# Patient Record
Sex: Male | Born: 1945 | Race: Black or African American | Hispanic: No | Marital: Single | Smoking: Former smoker
Health system: Southern US, Community
[De-identification: ages and names within clinical notes are randomized; demographics above are authoritative.]

## PROBLEM LIST (undated history)

## (undated) DIAGNOSIS — I10 Essential (primary) hypertension: Secondary | ICD-10-CM

## (undated) DIAGNOSIS — M1990 Unspecified osteoarthritis, unspecified site: Secondary | ICD-10-CM

## (undated) DIAGNOSIS — E785 Hyperlipidemia, unspecified: Secondary | ICD-10-CM

## (undated) DIAGNOSIS — I4891 Unspecified atrial fibrillation: Secondary | ICD-10-CM

## (undated) DIAGNOSIS — I219 Acute myocardial infarction, unspecified: Secondary | ICD-10-CM

## (undated) DIAGNOSIS — Q248 Other specified congenital malformations of heart: Secondary | ICD-10-CM

## (undated) DIAGNOSIS — Z95810 Presence of automatic (implantable) cardiac defibrillator: Secondary | ICD-10-CM

## (undated) DIAGNOSIS — K635 Polyp of colon: Secondary | ICD-10-CM

## (undated) DIAGNOSIS — J449 Chronic obstructive pulmonary disease, unspecified: Secondary | ICD-10-CM

## (undated) DIAGNOSIS — M2390 Unspecified internal derangement of unspecified knee: Secondary | ICD-10-CM

## (undated) DIAGNOSIS — E119 Type 2 diabetes mellitus without complications: Secondary | ICD-10-CM

## (undated) DIAGNOSIS — N400 Enlarged prostate without lower urinary tract symptoms: Secondary | ICD-10-CM

## (undated) DIAGNOSIS — R7303 Prediabetes: Secondary | ICD-10-CM

## (undated) DIAGNOSIS — K219 Gastro-esophageal reflux disease without esophagitis: Secondary | ICD-10-CM

## (undated) DIAGNOSIS — I639 Cerebral infarction, unspecified: Secondary | ICD-10-CM

## (undated) DIAGNOSIS — D472 Monoclonal gammopathy: Secondary | ICD-10-CM

## (undated) DIAGNOSIS — W19XXXA Unspecified fall, initial encounter: Secondary | ICD-10-CM

## (undated) DIAGNOSIS — Z87448 Personal history of other diseases of urinary system: Secondary | ICD-10-CM

## (undated) DIAGNOSIS — I2510 Atherosclerotic heart disease of native coronary artery without angina pectoris: Secondary | ICD-10-CM

## (undated) DIAGNOSIS — G40909 Epilepsy, unspecified, not intractable, without status epilepticus: Secondary | ICD-10-CM

## (undated) DIAGNOSIS — N181 Chronic kidney disease, stage 1: Secondary | ICD-10-CM

## (undated) HISTORY — DX: Unspecified atrial fibrillation: I48.91

## (undated) HISTORY — DX: Gastro-esophageal reflux disease without esophagitis: K21.9

## (undated) HISTORY — DX: Hyperlipidemia, unspecified: E78.5

## (undated) HISTORY — DX: Chronic obstructive pulmonary disease, unspecified: J44.9

## (undated) HISTORY — DX: Prediabetes: R73.03

## (undated) HISTORY — DX: Benign prostatic hyperplasia without lower urinary tract symptoms: N40.0

## (undated) HISTORY — DX: Cerebral infarction, unspecified: I63.9

## (undated) HISTORY — DX: Unspecified fall, initial encounter: W19.XXXA

## (undated) HISTORY — DX: Unspecified osteoarthritis, unspecified site: M19.90

## (undated) HISTORY — DX: Chronic kidney disease, stage 1: N18.1

## (undated) HISTORY — PX: INGUINAL HERNIA REPAIR: SUR1180

## (undated) HISTORY — DX: Polyp of colon: K63.5

## (undated) HISTORY — DX: Essential (primary) hypertension: I10

## (undated) HISTORY — DX: Personal history of other diseases of urinary system: Z87.448

## (undated) HISTORY — DX: Presence of automatic (implantable) cardiac defibrillator: Z95.810

## (undated) HISTORY — DX: Atherosclerotic heart disease of native coronary artery without angina pectoris: I25.10

## (undated) HISTORY — DX: Monoclonal gammopathy: D47.2

## (undated) HISTORY — DX: Epilepsy, unspecified, not intractable, without status epilepticus: G40.909

## (undated) HISTORY — DX: Other specified congenital malformations of heart: Q24.8

---
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
## 1983-06-22 DIAGNOSIS — I219 Acute myocardial infarction, unspecified: Secondary | ICD-10-CM

## 1983-06-22 HISTORY — DX: Acute myocardial infarction, unspecified: I21.9

## 1985-03-23 HISTORY — PX: CARDIAC CATHETERIZATION: SHX172

## 2002-03-23 DIAGNOSIS — K635 Polyp of colon: Secondary | ICD-10-CM

## 2002-03-23 HISTORY — DX: Polyp of colon: K63.5

## 2003-06-21 ENCOUNTER — Ambulatory Visit (HOSPITAL_COMMUNITY): Admission: RE | Admit: 2003-06-21 | Discharge: 2003-06-21 | Payer: Self-pay | Admitting: Psychiatry

## 2003-06-21 ENCOUNTER — Encounter: Payer: Self-pay | Admitting: Cardiology

## 2003-11-20 ENCOUNTER — Inpatient Hospital Stay (HOSPITAL_COMMUNITY): Admission: EM | Admit: 2003-11-20 | Discharge: 2003-11-22 | Payer: Self-pay | Admitting: Emergency Medicine

## 2003-11-21 ENCOUNTER — Encounter: Payer: Self-pay | Admitting: Cardiology

## 2004-03-03 ENCOUNTER — Ambulatory Visit: Payer: Self-pay | Admitting: Pulmonary Disease

## 2004-05-08 ENCOUNTER — Ambulatory Visit: Payer: Self-pay | Admitting: Pulmonary Disease

## 2004-06-03 ENCOUNTER — Ambulatory Visit: Payer: Self-pay | Admitting: Pulmonary Disease

## 2004-06-06 ENCOUNTER — Emergency Department (HOSPITAL_COMMUNITY): Admission: EM | Admit: 2004-06-06 | Discharge: 2004-06-06 | Payer: Self-pay | Admitting: Family Medicine

## 2004-12-04 ENCOUNTER — Ambulatory Visit: Payer: Self-pay | Admitting: Pulmonary Disease

## 2005-04-02 ENCOUNTER — Ambulatory Visit: Payer: Self-pay | Admitting: Pulmonary Disease

## 2005-06-15 ENCOUNTER — Inpatient Hospital Stay (HOSPITAL_COMMUNITY): Admission: EM | Admit: 2005-06-15 | Discharge: 2005-06-17 | Payer: Self-pay | Admitting: Emergency Medicine

## 2005-06-24 ENCOUNTER — Ambulatory Visit: Payer: Self-pay | Admitting: Pulmonary Disease

## 2005-07-07 ENCOUNTER — Encounter: Admission: RE | Admit: 2005-07-07 | Discharge: 2005-08-05 | Payer: Self-pay | Admitting: Neurology

## 2005-07-31 ENCOUNTER — Ambulatory Visit: Payer: Self-pay | Admitting: Pulmonary Disease

## 2005-08-18 ENCOUNTER — Encounter: Admission: RE | Admit: 2005-08-18 | Discharge: 2005-11-16 | Payer: Self-pay | Admitting: Pulmonary Disease

## 2005-09-11 ENCOUNTER — Ambulatory Visit: Payer: Self-pay | Admitting: Pulmonary Disease

## 2005-10-20 ENCOUNTER — Ambulatory Visit: Payer: Self-pay | Admitting: Pulmonary Disease

## 2005-11-17 ENCOUNTER — Ambulatory Visit: Payer: Self-pay | Admitting: Pulmonary Disease

## 2005-12-01 ENCOUNTER — Ambulatory Visit: Payer: Self-pay | Admitting: Pulmonary Disease

## 2005-12-14 ENCOUNTER — Ambulatory Visit: Payer: Self-pay | Admitting: Pulmonary Disease

## 2005-12-18 ENCOUNTER — Ambulatory Visit: Payer: Self-pay | Admitting: Gastroenterology

## 2006-01-01 ENCOUNTER — Ambulatory Visit: Payer: Self-pay | Admitting: Gastroenterology

## 2006-02-08 ENCOUNTER — Ambulatory Visit (HOSPITAL_COMMUNITY): Admission: RE | Admit: 2006-02-08 | Discharge: 2006-02-08 | Payer: Self-pay | Admitting: Pulmonary Disease

## 2006-02-08 ENCOUNTER — Ambulatory Visit: Payer: Self-pay | Admitting: Pulmonary Disease

## 2006-02-19 ENCOUNTER — Ambulatory Visit: Payer: Self-pay | Admitting: Pulmonary Disease

## 2006-02-26 ENCOUNTER — Emergency Department (HOSPITAL_COMMUNITY): Admission: EM | Admit: 2006-02-26 | Discharge: 2006-02-26 | Payer: Self-pay | Admitting: Emergency Medicine

## 2006-03-02 ENCOUNTER — Ambulatory Visit: Payer: Self-pay | Admitting: Pulmonary Disease

## 2006-03-05 ENCOUNTER — Encounter: Admission: RE | Admit: 2006-03-05 | Discharge: 2006-03-05 | Payer: Self-pay | Admitting: Orthopedic Surgery

## 2006-03-31 ENCOUNTER — Ambulatory Visit: Payer: Self-pay | Admitting: Pulmonary Disease

## 2006-04-12 ENCOUNTER — Ambulatory Visit: Payer: Self-pay | Admitting: Cardiology

## 2006-04-28 ENCOUNTER — Ambulatory Visit: Payer: Self-pay | Admitting: Pulmonary Disease

## 2006-05-04 ENCOUNTER — Ambulatory Visit: Payer: Self-pay

## 2006-05-07 ENCOUNTER — Emergency Department (HOSPITAL_COMMUNITY): Admission: EM | Admit: 2006-05-07 | Discharge: 2006-05-07 | Payer: Self-pay | Admitting: Family Medicine

## 2006-05-10 ENCOUNTER — Ambulatory Visit: Payer: Self-pay | Admitting: Pulmonary Disease

## 2006-06-30 ENCOUNTER — Ambulatory Visit: Payer: Self-pay | Admitting: Pulmonary Disease

## 2006-07-29 ENCOUNTER — Ambulatory Visit: Payer: Self-pay | Admitting: Pulmonary Disease

## 2006-08-05 ENCOUNTER — Ambulatory Visit: Payer: Self-pay | Admitting: Pulmonary Disease

## 2006-08-05 LAB — CONVERTED CEMR LAB
AST: 24 units/L (ref 0–37)
Alkaline Phosphatase: 33 units/L — ABNORMAL LOW (ref 39–117)
BUN: 15 mg/dL (ref 6–23)
Basophils Relative: 1 % (ref 0.0–1.0)
CO2: 26 meq/L (ref 19–32)
Creatinine, Ser: 1.7 mg/dL — ABNORMAL HIGH (ref 0.4–1.5)
HCT: 36.4 % — ABNORMAL LOW (ref 39.0–52.0)
Hemoglobin: 12.1 g/dL — ABNORMAL LOW (ref 13.0–17.0)
LDL Cholesterol: 55 mg/dL (ref 0–99)
Monocytes Absolute: 0.3 10*3/uL (ref 0.2–0.7)
Neutrophils Relative %: 60.2 % (ref 43.0–77.0)
Potassium: 4.1 meq/L (ref 3.5–5.1)
RDW: 13.4 % (ref 11.5–14.6)
Sodium: 138 meq/L (ref 135–145)
Total Bilirubin: 0.8 mg/dL (ref 0.3–1.2)
Total Protein: 7.3 g/dL (ref 6.0–8.3)
VLDL: 14 mg/dL (ref 0–40)

## 2006-08-10 ENCOUNTER — Emergency Department (HOSPITAL_COMMUNITY): Admission: EM | Admit: 2006-08-10 | Discharge: 2006-08-10 | Payer: Self-pay | Admitting: Emergency Medicine

## 2006-08-11 ENCOUNTER — Ambulatory Visit: Payer: Self-pay | Admitting: Internal Medicine

## 2006-08-11 ENCOUNTER — Ambulatory Visit: Payer: Self-pay | Admitting: Cardiology

## 2006-08-11 ENCOUNTER — Inpatient Hospital Stay (HOSPITAL_COMMUNITY): Admission: EM | Admit: 2006-08-11 | Discharge: 2006-08-18 | Payer: Self-pay | Admitting: Emergency Medicine

## 2006-08-12 ENCOUNTER — Encounter: Payer: Self-pay | Admitting: Internal Medicine

## 2006-08-17 HISTORY — PX: CARDIAC DEFIBRILLATOR PLACEMENT: SHX171

## 2006-08-26 ENCOUNTER — Ambulatory Visit: Payer: Self-pay | Admitting: Internal Medicine

## 2006-09-01 ENCOUNTER — Ambulatory Visit: Payer: Self-pay | Admitting: Pulmonary Disease

## 2006-09-08 ENCOUNTER — Ambulatory Visit: Payer: Self-pay | Admitting: Cardiology

## 2006-09-08 ENCOUNTER — Ambulatory Visit: Payer: Self-pay

## 2006-09-16 ENCOUNTER — Ambulatory Visit: Payer: Self-pay

## 2006-10-07 ENCOUNTER — Ambulatory Visit: Payer: Self-pay | Admitting: Oncology

## 2006-10-27 LAB — CBC WITH DIFFERENTIAL/PLATELET
Basophils Absolute: 0 10*3/uL (ref 0.0–0.1)
EOS%: 1.3 % (ref 0.0–7.0)
Eosinophils Absolute: 0.1 10*3/uL (ref 0.0–0.5)
HCT: 35.9 % — ABNORMAL LOW (ref 38.7–49.9)
HGB: 12.6 g/dL — ABNORMAL LOW (ref 13.0–17.1)
MCH: 31.4 pg (ref 28.0–33.4)
NEUT#: 3.1 10*3/uL (ref 1.5–6.5)
NEUT%: 66.7 % (ref 40.0–75.0)
lymph#: 1.1 10*3/uL (ref 0.9–3.3)

## 2006-10-29 LAB — SPEP & IFE WITH QIG
Beta 2: 2.7 % — ABNORMAL LOW (ref 3.2–6.5)
Gamma Globulin: 19.2 % — ABNORMAL HIGH (ref 11.1–18.8)
IgA: 59 mg/dL — ABNORMAL LOW (ref 68–378)
IgG (Immunoglobin G), Serum: 1480 mg/dL (ref 694–1618)
IgM, Serum: 39 mg/dL — ABNORMAL LOW (ref 60–263)
M-Spike, %: 0.85 g/dL

## 2006-10-29 LAB — KAPPA/LAMBDA LIGHT CHAINS
Kappa:Lambda Ratio: 4 — ABNORMAL HIGH (ref 0.26–1.65)
Lambda Free Lght Chn: 1.08 mg/dL (ref 0.57–2.63)

## 2006-10-29 LAB — COMPREHENSIVE METABOLIC PANEL
Albumin: 4.3 g/dL (ref 3.5–5.2)
Alkaline Phosphatase: 35 U/L — ABNORMAL LOW (ref 39–117)
BUN: 19 mg/dL (ref 6–23)
CO2: 24 mEq/L (ref 19–32)
Calcium: 9.3 mg/dL (ref 8.4–10.5)
Chloride: 106 mEq/L (ref 96–112)
Glucose, Bld: 113 mg/dL — ABNORMAL HIGH (ref 70–99)
Potassium: 4.1 mEq/L (ref 3.5–5.3)
Sodium: 139 mEq/L (ref 135–145)
Total Protein: 7.2 g/dL (ref 6.0–8.3)

## 2006-10-29 LAB — LACTATE DEHYDROGENASE: LDH: 140 U/L (ref 94–250)

## 2006-11-02 ENCOUNTER — Ambulatory Visit: Payer: Self-pay | Admitting: Pulmonary Disease

## 2006-11-02 LAB — UIFE/LIGHT CHAINS/TP QN, 24-HR UR
Free Kappa Lt Chains,Ur: 3.86 mg/dL — ABNORMAL HIGH (ref 0.04–1.51)
Free Lt Chn Excr Rate: 104.22 mg/d

## 2006-11-03 ENCOUNTER — Ambulatory Visit (HOSPITAL_COMMUNITY): Admission: RE | Admit: 2006-11-03 | Discharge: 2006-11-03 | Payer: Self-pay | Admitting: Oncology

## 2006-11-26 ENCOUNTER — Ambulatory Visit: Payer: Self-pay | Admitting: Pulmonary Disease

## 2006-12-01 ENCOUNTER — Ambulatory Visit: Payer: Self-pay | Admitting: Cardiology

## 2006-12-08 ENCOUNTER — Ambulatory Visit: Payer: Self-pay | Admitting: Oncology

## 2006-12-10 ENCOUNTER — Ambulatory Visit: Payer: Self-pay | Admitting: Pulmonary Disease

## 2006-12-10 LAB — CONVERTED CEMR LAB
AST: 25 units/L (ref 0–37)
Basophils Relative: 1 % (ref 0.0–1.0)
CO2: 29 meq/L (ref 19–32)
Chloride: 107 meq/L (ref 96–112)
Cholesterol: 130 mg/dL (ref 0–200)
Creatinine, Ser: 1.8 mg/dL — ABNORMAL HIGH (ref 0.4–1.5)
Eosinophils Relative: 3.4 % (ref 0.0–5.0)
Glucose, Bld: 113 mg/dL — ABNORMAL HIGH (ref 70–99)
HCT: 37.1 % — ABNORMAL LOW (ref 39.0–52.0)
Hemoglobin: 12.8 g/dL — ABNORMAL LOW (ref 13.0–17.0)
Iron: 93 ug/dL (ref 42–165)
LDL Cholesterol: 65 mg/dL (ref 0–99)
MCHC: 34.5 g/dL (ref 30.0–36.0)
Monocytes Absolute: 0.4 10*3/uL (ref 0.2–0.7)
Neutrophils Relative %: 57.4 % (ref 43.0–77.0)
Potassium: 4.4 meq/L (ref 3.5–5.1)
RDW: 12.3 % (ref 11.5–14.6)
Sodium: 141 meq/L (ref 135–145)
Total Bilirubin: 0.7 mg/dL (ref 0.3–1.2)
Total Protein: 7.4 g/dL (ref 6.0–8.3)
Transferrin: 239.6 mg/dL (ref >=212.0)
WBC: 4.3 10*3/uL — ABNORMAL LOW (ref 4.5–10.5)

## 2006-12-10 LAB — CBC WITH DIFFERENTIAL/PLATELET
Basophils Absolute: 0 10*3/uL (ref 0.0–0.1)
EOS%: 2.6 % (ref 0.0–7.0)
Eosinophils Absolute: 0.1 10*3/uL (ref 0.0–0.5)
HCT: 37.6 % — ABNORMAL LOW (ref 38.7–49.9)
HGB: 13.1 g/dL (ref 13.0–17.1)
MCH: 31.7 pg (ref 28.0–33.4)
MCV: 91.2 fL (ref 81.6–98.0)
NEUT#: 2.3 10*3/uL (ref 1.5–6.5)
NEUT%: 60.3 % (ref 40.0–75.0)
lymph#: 1 10*3/uL (ref 0.9–3.3)

## 2006-12-14 LAB — SPEP & IFE WITH QIG
Albumin ELP: 59 % (ref 55.8–66.1)
Beta Globulin: 5.9 % (ref 4.7–7.2)
IgA: 58 mg/dL — ABNORMAL LOW (ref 68–378)
IgG (Immunoglobin G), Serum: 1680 mg/dL — ABNORMAL HIGH (ref 694–1618)
IgM, Serum: 31 mg/dL — ABNORMAL LOW (ref 60–263)
M-Spike, %: 1.01 g/dL
Total Protein, Serum Electrophoresis: 7.7 g/dL (ref 6.0–8.3)

## 2006-12-14 LAB — COMPREHENSIVE METABOLIC PANEL
ALT: 26 U/L (ref 0–53)
AST: 21 U/L (ref 0–37)
Albumin: 4.5 g/dL (ref 3.5–5.2)
Alkaline Phosphatase: 29 U/L — ABNORMAL LOW (ref 39–117)
Glucose, Bld: 105 mg/dL — ABNORMAL HIGH (ref 70–99)
Potassium: 4.4 mEq/L (ref 3.5–5.3)
Sodium: 140 mEq/L (ref 135–145)
Total Bilirubin: 0.6 mg/dL (ref 0.3–1.2)
Total Protein: 7.7 g/dL (ref 6.0–8.3)

## 2006-12-14 LAB — KAPPA/LAMBDA LIGHT CHAINS: Kappa free light chain: 4.64 mg/dL — ABNORMAL HIGH (ref 0.33–1.94)

## 2006-12-17 ENCOUNTER — Ambulatory Visit: Payer: Self-pay | Admitting: Internal Medicine

## 2007-01-25 ENCOUNTER — Ambulatory Visit: Payer: Self-pay | Admitting: Cardiology

## 2007-02-08 DIAGNOSIS — R569 Unspecified convulsions: Secondary | ICD-10-CM | POA: Insufficient documentation

## 2007-02-08 DIAGNOSIS — E785 Hyperlipidemia, unspecified: Secondary | ICD-10-CM | POA: Insufficient documentation

## 2007-02-08 DIAGNOSIS — I6350 Cerebral infarction due to unspecified occlusion or stenosis of unspecified cerebral artery: Secondary | ICD-10-CM | POA: Insufficient documentation

## 2007-02-08 DIAGNOSIS — J449 Chronic obstructive pulmonary disease, unspecified: Secondary | ICD-10-CM | POA: Insufficient documentation

## 2007-03-09 ENCOUNTER — Ambulatory Visit: Payer: Self-pay | Admitting: Pulmonary Disease

## 2007-03-29 ENCOUNTER — Ambulatory Visit: Payer: Self-pay | Admitting: Pulmonary Disease

## 2007-03-29 DIAGNOSIS — I2510 Atherosclerotic heart disease of native coronary artery without angina pectoris: Secondary | ICD-10-CM | POA: Insufficient documentation

## 2007-03-29 DIAGNOSIS — N259 Disorder resulting from impaired renal tubular function, unspecified: Secondary | ICD-10-CM | POA: Insufficient documentation

## 2007-04-28 ENCOUNTER — Encounter: Payer: Self-pay | Admitting: Pulmonary Disease

## 2007-05-02 ENCOUNTER — Ambulatory Visit: Payer: Self-pay | Admitting: Oncology

## 2007-05-05 LAB — CBC WITH DIFFERENTIAL/PLATELET
BASO%: 0.7 % (ref 0.0–2.0)
EOS%: 1.1 % (ref 0.0–7.0)
HCT: 39.2 % (ref 38.7–49.9)
LYMPH%: 25.8 % (ref 14.0–48.0)
MCH: 31.2 pg (ref 28.0–33.4)
MCHC: 34.3 g/dL (ref 32.0–35.9)
MONO%: 8.2 % (ref 0.0–13.0)
NEUT%: 64.2 % (ref 40.0–75.0)
lymph#: 1.4 10*3/uL (ref 0.9–3.3)

## 2007-05-09 LAB — SPEP & IFE WITH QIG
Beta 2: 3.5 % (ref 3.2–6.5)
Beta Globulin: 5.4 % (ref 4.7–7.2)
Gamma Globulin: 19.7 % — ABNORMAL HIGH (ref 11.1–18.8)
IgA: 58 mg/dL — ABNORMAL LOW (ref 68–378)
IgG (Immunoglobin G), Serum: 1650 mg/dL — ABNORMAL HIGH (ref 694–1618)
IgM, Serum: 25 mg/dL — ABNORMAL LOW (ref 60–263)

## 2007-05-09 LAB — COMPREHENSIVE METABOLIC PANEL
AST: 20 U/L (ref 0–37)
Alkaline Phosphatase: 30 U/L — ABNORMAL LOW (ref 39–117)
BUN: 22 mg/dL (ref 6–23)
Creatinine, Ser: 1.63 mg/dL — ABNORMAL HIGH (ref 0.40–1.50)
Potassium: 4.6 mEq/L (ref 3.5–5.3)
Total Bilirubin: 0.6 mg/dL (ref 0.3–1.2)

## 2007-05-09 LAB — KAPPA/LAMBDA LIGHT CHAINS
Kappa:Lambda Ratio: 3.47 — ABNORMAL HIGH (ref 0.26–1.65)
Lambda Free Lght Chn: 1.67 mg/dL (ref 0.57–2.63)

## 2007-05-10 LAB — UIFE/LIGHT CHAINS/TP QN, 24-HR UR: Free Kappa Lt Chains,Ur: 3.12 mg/dL — ABNORMAL HIGH (ref 0.04–1.51)

## 2007-05-12 ENCOUNTER — Encounter: Payer: Self-pay | Admitting: Pulmonary Disease

## 2007-08-08 ENCOUNTER — Ambulatory Visit: Payer: Self-pay | Admitting: Pulmonary Disease

## 2007-08-09 ENCOUNTER — Ambulatory Visit: Payer: Self-pay | Admitting: Internal Medicine

## 2007-08-09 ENCOUNTER — Ambulatory Visit: Payer: Self-pay | Admitting: Pulmonary Disease

## 2007-08-18 ENCOUNTER — Ambulatory Visit: Payer: Self-pay | Admitting: Cardiology

## 2007-08-23 ENCOUNTER — Telehealth: Payer: Self-pay | Admitting: Pulmonary Disease

## 2007-08-26 LAB — CONVERTED CEMR LAB
ALT: 33 units/L (ref 0–53)
AST: 24 units/L (ref 0–37)
Basophils Absolute: 0 10*3/uL (ref 0.0–0.1)
Basophils Relative: 1 % (ref 0.0–1.0)
Bilirubin, Direct: 0.1 mg/dL (ref 0.0–0.3)
CO2: 29 meq/L (ref 19–32)
Calcium: 9.3 mg/dL (ref 8.4–10.5)
Chloride: 104 meq/L (ref 96–112)
Glucose, Bld: 117 mg/dL — ABNORMAL HIGH (ref 70–99)
Hemoglobin: 13.5 g/dL (ref 13.0–17.0)
LDL Cholesterol: 67 mg/dL (ref 0–99)
Lymphocytes Relative: 28.7 % (ref 12.0–46.0)
MCHC: 34.2 g/dL (ref 30.0–36.0)
Monocytes Relative: 9.9 % (ref 3.0–12.0)
Neutro Abs: 2.1 10*3/uL (ref 1.4–7.7)
Neutrophils Relative %: 58.2 % (ref 43.0–77.0)
RBC: 4.26 M/uL (ref 4.22–5.81)
RDW: 12 % (ref 11.5–14.6)
Sodium: 139 meq/L (ref 135–145)
Total Bilirubin: 0.6 mg/dL (ref 0.3–1.2)
Total CHOL/HDL Ratio: 3.4
Total Protein: 6.9 g/dL (ref 6.0–8.3)
VLDL: 19 mg/dL (ref 0–40)

## 2007-10-31 ENCOUNTER — Ambulatory Visit: Payer: Self-pay | Admitting: Oncology

## 2007-11-07 LAB — UIFE/LIGHT CHAINS/TP QN, 24-HR UR
Alpha 1, Urine: DETECTED — AB
Free Kappa Lt Chains,Ur: 1.14 mg/dL (ref 0.04–1.51)
Free Kappa/Lambda Ratio: 19 ratio — ABNORMAL HIGH (ref 0.46–4.00)
Free Lambda Excretion/Day: 1.5 mg/d
Free Lt Chn Excr Rate: 28.5 mg/d
Gamma Globulin, Urine: DETECTED — AB
Time: 24 hours
Total Protein, Urine: 1.4 mg/dL
Volume, Urine: 2500 mL

## 2007-11-09 ENCOUNTER — Encounter: Payer: Self-pay | Admitting: Pulmonary Disease

## 2007-11-21 ENCOUNTER — Ambulatory Visit: Payer: Self-pay

## 2007-12-15 ENCOUNTER — Encounter: Payer: Self-pay | Admitting: Pulmonary Disease

## 2008-02-08 ENCOUNTER — Ambulatory Visit: Payer: Self-pay | Admitting: Pulmonary Disease

## 2008-02-12 DIAGNOSIS — N181 Chronic kidney disease, stage 1: Secondary | ICD-10-CM | POA: Insufficient documentation

## 2008-02-12 DIAGNOSIS — E1122 Type 2 diabetes mellitus with diabetic chronic kidney disease: Secondary | ICD-10-CM | POA: Insufficient documentation

## 2008-02-12 LAB — CONVERTED CEMR LAB
AST: 29 units/L (ref 0–37)
Albumin: 4.2 g/dL (ref 3.5–5.2)
Alkaline Phosphatase: 40 units/L (ref 39–117)
Bilirubin, Direct: 0.1 mg/dL (ref 0.0–0.3)
Chloride: 106 meq/L (ref 96–112)
GFR calc Af Amer: 57 mL/min
GFR calc non Af Amer: 47 mL/min
LDL Cholesterol: 61 mg/dL (ref 0–99)
Potassium: 4.8 meq/L (ref 3.5–5.1)
Sodium: 139 meq/L (ref 135–145)
Total Bilirubin: 0.7 mg/dL (ref 0.3–1.2)
Total CHOL/HDL Ratio: 3.3
VLDL: 30 mg/dL (ref 0–40)

## 2008-02-29 ENCOUNTER — Encounter: Payer: Self-pay | Admitting: Pulmonary Disease

## 2008-03-12 ENCOUNTER — Ambulatory Visit: Payer: Self-pay | Admitting: Internal Medicine

## 2008-03-18 ENCOUNTER — Emergency Department (HOSPITAL_COMMUNITY): Admission: EM | Admit: 2008-03-18 | Discharge: 2008-03-18 | Payer: Self-pay | Admitting: Family Medicine

## 2008-05-09 ENCOUNTER — Ambulatory Visit: Payer: Self-pay | Admitting: Oncology

## 2008-05-14 ENCOUNTER — Ambulatory Visit: Payer: Self-pay | Admitting: Internal Medicine

## 2008-06-05 ENCOUNTER — Encounter: Payer: Self-pay | Admitting: Internal Medicine

## 2008-06-06 LAB — CBC WITH DIFFERENTIAL/PLATELET
BASO%: 0.5 % (ref 0.0–2.0)
LYMPH%: 18.2 % (ref 14.0–49.0)
MCHC: 34.1 g/dL (ref 32.0–36.0)
MONO#: 0.5 10*3/uL (ref 0.1–0.9)
MONO%: 10.8 % (ref 0.0–14.0)
Platelets: 200 10*3/uL (ref 140–400)
RBC: 4.29 10*6/uL (ref 4.20–5.82)
WBC: 4.7 10*3/uL (ref 4.0–10.3)

## 2008-06-08 LAB — COMPREHENSIVE METABOLIC PANEL
ALT: 27 U/L (ref 0–53)
CO2: 23 mEq/L (ref 19–32)
Calcium: 9.4 mg/dL (ref 8.4–10.5)
Chloride: 105 mEq/L (ref 96–112)
Glucose, Bld: 104 mg/dL — ABNORMAL HIGH (ref 70–99)
Sodium: 138 mEq/L (ref 135–145)
Total Bilirubin: 0.6 mg/dL (ref 0.3–1.2)
Total Protein: 7.1 g/dL (ref 6.0–8.3)

## 2008-06-08 LAB — SPEP & IFE WITH QIG
Albumin ELP: 56.3 % (ref 55.8–66.1)
Alpha-1-Globulin: 5.1 % — ABNORMAL HIGH (ref 2.9–4.9)
Alpha-2-Globulin: 10.7 % (ref 7.1–11.8)
Beta 2: 4.2 % (ref 3.2–6.5)
Beta Globulin: 4.5 % — ABNORMAL LOW (ref 4.7–7.2)
IgA: 50 mg/dL — ABNORMAL LOW (ref 68–378)
Total Protein, Serum Electrophoresis: 7.1 g/dL (ref 6.0–8.3)

## 2008-06-08 LAB — KAPPA/LAMBDA LIGHT CHAINS
Kappa free light chain: 4.65 mg/dL — ABNORMAL HIGH (ref 0.33–1.94)
Lambda Free Lght Chn: 0.66 mg/dL (ref 0.57–2.63)

## 2008-06-12 LAB — UIFE/LIGHT CHAINS/TP QN, 24-HR UR
Albumin, U: DETECTED
Alpha 1, Urine: DETECTED — AB
Alpha 2, Urine: DETECTED — AB

## 2008-06-15 ENCOUNTER — Encounter: Payer: Self-pay | Admitting: Pulmonary Disease

## 2008-07-11 ENCOUNTER — Telehealth (INDEPENDENT_AMBULATORY_CARE_PROVIDER_SITE_OTHER): Payer: Self-pay | Admitting: *Deleted

## 2008-07-24 DIAGNOSIS — I219 Acute myocardial infarction, unspecified: Secondary | ICD-10-CM | POA: Insufficient documentation

## 2008-07-24 DIAGNOSIS — Z95810 Presence of automatic (implantable) cardiac defibrillator: Secondary | ICD-10-CM | POA: Insufficient documentation

## 2008-07-25 ENCOUNTER — Ambulatory Visit: Payer: Self-pay | Admitting: Cardiology

## 2008-07-31 ENCOUNTER — Ambulatory Visit: Payer: Self-pay | Admitting: Internal Medicine

## 2008-08-07 ENCOUNTER — Ambulatory Visit: Payer: Self-pay | Admitting: Pulmonary Disease

## 2008-08-07 LAB — CONVERTED CEMR LAB
AST: 18 units/L (ref 0–37)
Alkaline Phosphatase: 47 units/L (ref 39–117)
Basophils Absolute: 0.1 10*3/uL (ref 0.0–0.1)
Basophils Relative: 1.2 % (ref 0.0–3.0)
Bilirubin, Direct: 0.1 mg/dL (ref 0.0–0.3)
CO2: 30 meq/L (ref 19–32)
Calcium: 9.2 mg/dL (ref 8.4–10.5)
Creatinine, Ser: 1.2 mg/dL (ref 0.4–1.5)
Eosinophils Absolute: 0.1 10*3/uL (ref 0.0–0.7)
GFR calc non Af Amer: 79.27 mL/min (ref >60)
HDL: 45.4 mg/dL (ref >39.00)
Lymphocytes Relative: 25.2 % (ref 12.0–46.0)
MCHC: 33.9 g/dL (ref 30.0–36.0)
Monocytes Relative: 6.3 % (ref 3.0–12.0)
Neutrophils Relative %: 64.7 % (ref 43.0–77.0)
RBC: 4.55 M/uL (ref 4.22–5.81)
RDW: 13.2 % (ref 11.5–14.6)
Total CHOL/HDL Ratio: 3
Triglycerides: 140 mg/dL (ref 0.0–149.0)
VLDL: 28 mg/dL (ref 0.0–40.0)

## 2008-08-16 ENCOUNTER — Ambulatory Visit: Payer: Self-pay | Admitting: Pulmonary Disease

## 2008-08-16 LAB — CONVERTED CEMR LAB
Cholesterol, target level: 200 mg/dL
LDL Goal: 70 mg/dL

## 2008-09-12 ENCOUNTER — Encounter: Payer: Self-pay | Admitting: Cardiology

## 2008-09-12 ENCOUNTER — Encounter: Payer: Self-pay | Admitting: Pulmonary Disease

## 2008-10-09 ENCOUNTER — Telehealth: Payer: Self-pay | Admitting: Pulmonary Disease

## 2008-10-11 ENCOUNTER — Ambulatory Visit: Payer: Self-pay | Admitting: Pulmonary Disease

## 2008-10-23 ENCOUNTER — Ambulatory Visit: Payer: Self-pay | Admitting: Internal Medicine

## 2008-11-02 ENCOUNTER — Encounter: Payer: Self-pay | Admitting: Internal Medicine

## 2008-11-08 ENCOUNTER — Ambulatory Visit: Payer: Self-pay | Admitting: Pulmonary Disease

## 2008-11-19 ENCOUNTER — Telehealth: Payer: Self-pay | Admitting: Cardiology

## 2008-12-11 ENCOUNTER — Ambulatory Visit: Payer: Self-pay | Admitting: Oncology

## 2008-12-13 LAB — CBC WITH DIFFERENTIAL/PLATELET
BASO%: 0.7 % (ref 0.0–2.0)
Basophils Absolute: 0 10*3/uL (ref 0.0–0.1)
EOS%: 2.2 % (ref 0.0–7.0)
HGB: 14.6 g/dL (ref 13.0–17.1)
MCH: 32.6 pg (ref 27.2–33.4)
RDW: 12.5 % (ref 11.0–14.6)
WBC: 4.9 10*3/uL (ref 4.0–10.3)
lymph#: 1.2 10*3/uL (ref 0.9–3.3)

## 2008-12-13 LAB — COMPREHENSIVE METABOLIC PANEL
ALT: 21 U/L (ref 0–53)
AST: 19 U/L (ref 0–37)
Albumin: 4.3 g/dL (ref 3.5–5.2)
BUN: 16 mg/dL (ref 6–23)
Calcium: 9.3 mg/dL (ref 8.4–10.5)
Chloride: 106 mEq/L (ref 96–112)
Potassium: 4 mEq/L (ref 3.5–5.3)

## 2008-12-17 LAB — SPEP & IFE WITH QIG
Alpha-2-Globulin: 8.8 % (ref 7.1–11.8)
Gamma Globulin: 19.2 % — ABNORMAL HIGH (ref 11.1–18.8)
IgA: 55 mg/dL — ABNORMAL LOW (ref 68–378)
IgG (Immunoglobin G), Serum: 1720 mg/dL — ABNORMAL HIGH (ref 694–1618)
IgM, Serum: 20 mg/dL — ABNORMAL LOW (ref 60–263)
M-Spike, %: 1.06 g/dL

## 2008-12-17 LAB — KAPPA/LAMBDA LIGHT CHAINS: Kappa free light chain: 5.29 mg/dL — ABNORMAL HIGH (ref 0.33–1.94)

## 2008-12-17 LAB — BETA 2 MICROGLOBULIN, SERUM: Beta-2 Microglobulin: 1.97 mg/L — ABNORMAL HIGH (ref 1.01–1.73)

## 2008-12-20 ENCOUNTER — Encounter: Payer: Self-pay | Admitting: Internal Medicine

## 2009-01-28 ENCOUNTER — Ambulatory Visit: Payer: Self-pay | Admitting: Internal Medicine

## 2009-01-29 ENCOUNTER — Encounter: Payer: Self-pay | Admitting: Internal Medicine

## 2009-02-04 ENCOUNTER — Ambulatory Visit: Payer: Self-pay | Admitting: Pulmonary Disease

## 2009-02-05 LAB — CONVERTED CEMR LAB
BUN: 14 mg/dL (ref 6–23)
CO2: 27 meq/L (ref 19–32)
Chloride: 104 meq/L (ref 96–112)
Cholesterol: 143 mg/dL (ref 0–200)
Creatinine, Ser: 1.1 mg/dL (ref 0.4–1.5)
Glucose, Bld: 78 mg/dL (ref 70–99)
Triglycerides: 194 mg/dL — ABNORMAL HIGH (ref 0.0–149.0)

## 2009-02-07 ENCOUNTER — Telehealth: Payer: Self-pay | Admitting: Pulmonary Disease

## 2009-02-07 ENCOUNTER — Encounter: Payer: Self-pay | Admitting: Internal Medicine

## 2009-02-21 ENCOUNTER — Encounter: Payer: Self-pay | Admitting: Pulmonary Disease

## 2009-03-07 ENCOUNTER — Encounter: Payer: Self-pay | Admitting: Pulmonary Disease

## 2009-03-07 ENCOUNTER — Encounter: Payer: Self-pay | Admitting: Cardiology

## 2009-03-17 ENCOUNTER — Encounter: Payer: Self-pay | Admitting: Cardiology

## 2009-05-17 ENCOUNTER — Encounter: Payer: Self-pay | Admitting: Internal Medicine

## 2009-05-28 ENCOUNTER — Ambulatory Visit: Payer: Self-pay | Admitting: Internal Medicine

## 2009-06-04 ENCOUNTER — Encounter: Payer: Self-pay | Admitting: Internal Medicine

## 2009-06-14 ENCOUNTER — Ambulatory Visit: Payer: Self-pay | Admitting: Oncology

## 2009-06-18 LAB — CBC WITH DIFFERENTIAL/PLATELET
BASO%: 0.6 % (ref 0.0–2.0)
EOS%: 2.3 % (ref 0.0–7.0)
Eosinophils Absolute: 0.1 10*3/uL (ref 0.0–0.5)
LYMPH%: 28.4 % (ref 14.0–49.0)
MCHC: 33.9 g/dL (ref 32.0–36.0)
MCV: 94.6 fL (ref 79.3–98.0)
MONO%: 8.3 % (ref 0.0–14.0)
NEUT#: 3.1 10*3/uL (ref 1.5–6.5)
RBC: 4.59 10*6/uL (ref 4.20–5.82)
RDW: 12.3 % (ref 11.0–14.6)
WBC: 5.1 10*3/uL (ref 4.0–10.3)

## 2009-06-20 LAB — COMPREHENSIVE METABOLIC PANEL
ALT: 21 U/L (ref 0–53)
AST: 18 U/L (ref 0–37)
Albumin: 4.5 g/dL (ref 3.5–5.2)
Alkaline Phosphatase: 50 U/L (ref 39–117)
Glucose, Bld: 85 mg/dL (ref 70–99)
Potassium: 4.1 mEq/L (ref 3.5–5.3)
Sodium: 138 mEq/L (ref 135–145)
Total Bilirubin: 0.8 mg/dL (ref 0.3–1.2)
Total Protein: 7.6 g/dL (ref 6.0–8.3)

## 2009-06-20 LAB — SPEP & IFE WITH QIG
Albumin ELP: 59.2 % (ref 55.8–66.1)
Beta Globulin: 5.2 % (ref 4.7–7.2)
IgA: 54 mg/dL — ABNORMAL LOW (ref 68–378)
IgG (Immunoglobin G), Serum: 2000 mg/dL — ABNORMAL HIGH (ref 694–1618)
IgM, Serum: 21 mg/dL — ABNORMAL LOW (ref 60–263)
M-Spike, %: 1.17 g/dL
Total Protein, Serum Electrophoresis: 7.6 g/dL (ref 6.0–8.3)

## 2009-06-25 ENCOUNTER — Encounter: Payer: Self-pay | Admitting: Pulmonary Disease

## 2009-07-24 ENCOUNTER — Ambulatory Visit: Payer: Self-pay | Admitting: Cardiology

## 2009-08-05 ENCOUNTER — Ambulatory Visit: Payer: Self-pay | Admitting: Pulmonary Disease

## 2009-08-06 LAB — CONVERTED CEMR LAB
Albumin: 4.2 g/dL (ref 3.5–5.2)
Alkaline Phosphatase: 49 units/L (ref 39–117)
BUN: 13 mg/dL (ref 6–23)
CO2: 28 meq/L (ref 19–32)
Calcium: 9.3 mg/dL (ref 8.4–10.5)
Creatinine, Ser: 1.2 mg/dL (ref 0.4–1.5)
Glucose, Bld: 79 mg/dL (ref 70–99)
HDL: 40.8 mg/dL (ref >39.00)
TSH: 2.7 microintl units/mL (ref 0.35–5.50)
Total Protein: 7.3 g/dL (ref 6.0–8.3)
Triglycerides: 193 mg/dL — ABNORMAL HIGH (ref 0.0–149.0)

## 2009-08-07 ENCOUNTER — Ambulatory Visit: Payer: Self-pay | Admitting: Internal Medicine

## 2009-09-06 ENCOUNTER — Encounter: Payer: Self-pay | Admitting: Pulmonary Disease

## 2009-10-24 ENCOUNTER — Telehealth (INDEPENDENT_AMBULATORY_CARE_PROVIDER_SITE_OTHER): Payer: Self-pay | Admitting: *Deleted

## 2009-10-25 ENCOUNTER — Ambulatory Visit: Payer: Self-pay | Admitting: Pulmonary Disease

## 2009-10-25 ENCOUNTER — Encounter: Payer: Self-pay | Admitting: Adult Health

## 2009-11-06 ENCOUNTER — Encounter: Payer: Self-pay | Admitting: Internal Medicine

## 2009-11-07 ENCOUNTER — Ambulatory Visit: Payer: Self-pay | Admitting: Internal Medicine

## 2009-11-29 ENCOUNTER — Encounter: Payer: Self-pay | Admitting: Internal Medicine

## 2009-12-23 ENCOUNTER — Ambulatory Visit: Payer: Self-pay | Admitting: Oncology

## 2009-12-25 LAB — CBC WITH DIFFERENTIAL/PLATELET
BASO%: 0.6 % (ref 0.0–2.0)
EOS%: 1.8 % (ref 0.0–7.0)
HCT: 41.3 % (ref 38.4–49.9)
LYMPH%: 30.4 % (ref 14.0–49.0)
MCH: 32.7 pg (ref 27.2–33.4)
MCHC: 34.6 g/dL (ref 32.0–36.0)
MCV: 94.4 fL (ref 79.3–98.0)
MONO%: 7.8 % (ref 0.0–14.0)
NEUT%: 59.4 % (ref 39.0–75.0)
Platelets: 188 10*3/uL (ref 140–400)

## 2009-12-27 LAB — SPEP & IFE WITH QIG
Beta Globulin: 4.8 % (ref 4.7–7.2)
Gamma Globulin: 19.4 % — ABNORMAL HIGH (ref 11.1–18.8)
IgA: 48 mg/dL — ABNORMAL LOW (ref 68–378)
IgG (Immunoglobin G), Serum: 1760 mg/dL — ABNORMAL HIGH (ref 694–1618)
M-Spike, %: 1.07 g/dL
Total Protein, Serum Electrophoresis: 7.1 g/dL (ref 6.0–8.3)

## 2009-12-27 LAB — COMPREHENSIVE METABOLIC PANEL
ALT: 30 U/L (ref 0–53)
AST: 20 U/L (ref 0–37)
Creatinine, Ser: 1.34 mg/dL (ref 0.40–1.50)
Total Bilirubin: 0.8 mg/dL (ref 0.3–1.2)

## 2009-12-27 LAB — KAPPA/LAMBDA LIGHT CHAINS: Kappa:Lambda Ratio: 4.61 — ABNORMAL HIGH (ref 0.26–1.65)

## 2010-01-01 ENCOUNTER — Encounter: Payer: Self-pay | Admitting: Pulmonary Disease

## 2010-01-02 ENCOUNTER — Telehealth (INDEPENDENT_AMBULATORY_CARE_PROVIDER_SITE_OTHER): Payer: Self-pay | Admitting: *Deleted

## 2010-01-02 ENCOUNTER — Ambulatory Visit: Payer: Self-pay | Admitting: Pulmonary Disease

## 2010-02-03 ENCOUNTER — Ambulatory Visit: Payer: Self-pay | Admitting: Pulmonary Disease

## 2010-02-03 LAB — CONVERTED CEMR LAB
Cholesterol: 137 mg/dL (ref 0–200)
Direct LDL: 49.7 mg/dL
GFR calc non Af Amer: 74.56 mL/min (ref >60)
Glucose, Bld: 99 mg/dL (ref 70–99)
HDL: 42.2 mg/dL (ref >39.00)
Potassium: 5 meq/L (ref 3.5–5.1)
Sodium: 137 meq/L (ref 135–145)
VLDL: 45.6 mg/dL — ABNORMAL HIGH (ref 0.0–40.0)

## 2010-02-17 ENCOUNTER — Encounter: Payer: Self-pay | Admitting: Internal Medicine

## 2010-02-21 ENCOUNTER — Ambulatory Visit: Payer: Self-pay | Admitting: Internal Medicine

## 2010-02-25 ENCOUNTER — Encounter: Payer: Self-pay | Admitting: Pulmonary Disease

## 2010-03-18 ENCOUNTER — Encounter: Payer: Self-pay | Admitting: Internal Medicine

## 2010-04-07 ENCOUNTER — Encounter: Payer: Self-pay | Admitting: Internal Medicine

## 2010-04-13 ENCOUNTER — Encounter: Payer: Self-pay | Admitting: Orthopedic Surgery

## 2010-04-22 NOTE — Assessment & Plan Note (Signed)
Summary: scratch on leg/ unsure of last tetanus/ pt is diabetic/ddp   Primary Jerik Falletta/Referring Edna Grover:  Brad Brown  CC:  scratch on back of right calf/ankle - happened Wednesday.  would like tetanus shot.  History of Present Illness: 65 year old male with known history of HTN, COPD., CAD, Brugada syndrome, Hyperlidemia,   08/07/2008--here for a follow up visit... he has mult med problems as noted... states that he has been doing well of late- no new complaints or concerns... he has had recent f/u evals from [name], Hilary Hertz, DrShadad, DrSethi--- note reviewed and discussed w/ the patient...   Aug 16, 2008--Presents for an acute office visit. Complains of 1 week of chest congestion with prod cough with white phlegm, occ SOB/wheezing x1week. No otc used. Denies chest pain, dyspnea at rest,  orthopnea, hemoptysis, fever, n/v/d, edema, headache,recent travel or antibiotics. Just not getting better, wearing him down. " I really need the steroid shot to fix this"  October 11, 2008 --Presents for a follow up. [facility], unclear exact reason. No notes available. His last labs showed A1C  7.1>6.1 (5/10), scr 1.8>1.2 . Doing well , denies diarrhea, chest pain, polyuria/polydipsia, rash, abd pain, n/v. Does not check sugars at home.   November 08, 2008-Presents for DM follow up. Seen at [facility] recently. needs letter to [name] stating that it is necessary for pt to be on metformin. [facility],  No notes available. His last labs showed A1C  7.1>6.1 (5/10), scr 1.8>1.2 . Doing well ,    ~  Aug 06, 2009:  here for a 33mo follow up visit> the [facility] changed his Antara to SIMVASTATIN 20mg ; and they changed his prev Metformin to Glimepiride, and now to GLYBURIDE 2.5mg ... as usual he did not bring his meds to the OV despite numerous requests to get him to do so for [place] to review w/ him...  He saw [name] for Nephrology 12/10 for f/u of his chr renal  insuffic- extensive labs done & Creat=1.2 (it was prev 1.5-1.8), BP controlled, no proteinuria, he knows to avoid NSAIDs... 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    October 25, 2009 -Pt presents for a work in [place] of scratch on back of right calf/ankle - happened 3 days ago. Would like tetanus shot. Pt was working in house, a door came off hinge and cut back of right calf. Has scabbed over but now has turned red, and is hot to touch. TD is >5 yrs ago. No drainage, fever, severe pain, rash. Denies chest pain, dyspnea, orthopnea, hemoptysis, fever, n/v/d, edema, headache,recent travel . He is a diabetic.   Medications Prior to Update: 1)  Bayer Low Strength 81 Mg  Tbec (Aspirin) .... Take 1 Tablet Two Times A Day 2)  Aggrenox 25-200 Mg  Cp12 (Aspirin-Dipyridamole) .... Take 1 Capsule By Mouth Two Times A Day 3)  Metoprolol Tartrate 25 Mg Tabs (Metoprolol Tartrate) .... 1/2 Tab By Mouth Two Times A Day 4)  Simvastatin 20 Mg Tabs (Simvastatin) .... Take One Tablet By Mouth Daily At Bedtime 5)  Glyburide 2.5 Mg Tabs (Glyburide) .... 1/2 Tab By Mouth Once Daily 6)  Keppra 500 Mg  Tabs (Levetiracetam) .Marland Kitchen.. 1 Tab By Mouth Two Times A Day 7)  Multivitamins   Tabs (Multiple Vitamin) .... Take One By Mouth Once Daily 8)  Antara  130 Mg Caps (Fenofibrate Micronized) .Marland Kitchen.. 1 Capsule Once Daily  Current Medications (verified): 1)  Bayer Low Strength 81 Mg  Tbec (Aspirin) .... Take 1 Tablet Two Times A Day 2)  Aggrenox 25-200 Mg  Cp12 (Aspirin-Dipyridamole) .... Take 1 Capsule By Mouth Two Times A Day 3)  Metoprolol Tartrate 25 Mg Tabs (Metoprolol Tartrate) .... 1/2 Tab By Mouth Two Times A Day 4)  Simvastatin 20 Mg Tabs (Simvastatin) .... Take One Tablet By Mouth Daily At Bedtime 5)  Glyburide 2.5 Mg Tabs (Glyburide) .... 1/2 Tab By Mouth Once Daily 6)  Keppra 500 Mg  Tabs (Levetiracetam) .Marland Kitchen.. 1  Tab By Mouth Two Times A Day 7)  Multivitamins   Tabs (Multiple Vitamin) .... Take One By Mouth Once Daily 8)  Antara 130 Mg Caps (Fenofibrate Micronized) .Marland Kitchen.. 1 Capsule Once Daily  Allergies (verified): 1)  ! 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 Jude single chamber defibrillator 08/17/2006  Family History: Last updated: 07/24/2008 The patient has seven brothers and three sisters. One   brother died young of a myocardial infarction.      Social History: Last updated: 08/07/2008  The patient lives in [place] alone.  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 does not partake of alcoholic beverages.  exsmoker ---quit in 2006---1/2 ppd smoked on and off for 15 years  Risk Factors: Smoking Status: quit (02/08/2008)  Review of Systems      See HPI  Vital Signs:  Patient profile:   65 year old male Height:      69 inches Weight:      201.25 pounds BMI:     29.83 O2 Sat:      98 % on Room air Temp:     97.2 degrees F oral Pulse rate:   63 / minute BP sitting:   130 / 88  (right arm) Cuff size:   regular  Vitals Entered By: Brad Poisson CNA/MA (October 25, 2009 9:12 AM)  O2 Flow:  Room air CC: scratch on back of right calf/ankle - happened Wednesday.  would like tetanus shot Is Patient Diabetic? Yes Comments Medications reviewed with patient Daytime  contact number verified with patient. Brad Poisson CNA/MA  October 25, 2009 9:12 AM    Physical Exam  Additional Exam:  WD, WN, 65 y/o BM in NAD... GENERAL:  Alert & oriented; pleasant & cooperative... HEENT:  [place]/AT, EACs-clear, TMs-wnl, NOSE-clear, THROAT-clear & wnl. NECK:  Supple w/ fairROM; no JVD; normal carotid impulses w/o bruits; no thyromegaly or nodules palpated; no lymphadenopathy. CHEST:  CTA bilaterally HEART:  regular, gr 1/6 SEM without rubs or gallops heard... ABDOMEN:  Soft & nontender; normal bowel sounds; no organomegaly or masses detected. EXT: without deformities or arthritic changes; no varicose veins/ venous insuffic/ or edema. DERM: along posterior right calf is a 20cm linear scratch from upper calf to lower calf. scabbed over w/ surrounding erythema and hot to touch. no sign. swelling is noted.      Impression & Recommendations:  Problem # 1:  CELLULITIS, LEG, RIGHT (ICD-682.6)  Right posterior calf cellutlitis s/p injury/cut from door.  pt is DM w/ high risk for infection  wound cx is pending.  REC:  Wash the area w/ soap and water, pat dry gently apply Neosporin two times a day until healed.  Keflex four times a day for 7 days TDAP (Tetanus booster ) today.  Please contact office for sooner follow up if symptoms do not improve or worsen  His updated medication list for this problem includes:    Keflex 500 Mg Caps (Cephalexin) .Marland Kitchen... 1 by mouth four times a day  Orders: T-Culture, Wound ([id number]) Est. Patient Level IV [id number])  Medications Added to Medication List This Visit: 1)  Keflex 500 Mg Caps (Cephalexin) .Marland Kitchen.. 1 by mouth four times a day  Complete Medication List: 1)  Bayer Low Strength 81 Mg Tbec (Aspirin) .... Take 1 tablet two times a day 2)  Aggrenox 25-200 Mg Cp12 (Aspirin-dipyridamole) .... Take 1 capsule by mouth two times a day 3)  Metoprolol Tartrate 25 Mg Tabs (Metoprolol tartrate) .... 1/2 tab by mouth two times a day 4)   Simvastatin 20 Mg Tabs (Simvastatin) .... Take one tablet by mouth daily at bedtime 5)  Glyburide 2.5 Mg Tabs (Glyburide) .... 1/2 tab by mouth once daily 6)  Keppra 500 Mg Tabs (Levetiracetam) .Marland Kitchen.. 1 tab by mouth two times a day 7)  Multivitamins Tabs (Multiple vitamin) .... Take one by mouth once daily 8)  Antara 130 Mg Caps (Fenofibrate micronized) .Marland Kitchen.. 1 capsule once daily 9)  Keflex 500 Mg Caps (Cephalexin) .Marland Kitchen.. 1 by mouth four times a day  Patient Instructions: 1)  Wash the area w/ soap and water, pat dry gently 2)  apply Neosporin two times a day until healed.  3)  Keflex four times a day for 7 days 4)  TDAP (Tetanus booster ) today.  5)  Please contact office for sooner follow up if symptoms do not improve or worsen  Prescriptions: KEFLEX 500 MG CAPS (CEPHALEXIN) 1 by mouth four times a day  #28 x 0   Entered and Authorized by:   Rexene Edison NP   Signed by:   Tammy Parrett NP on 10/25/2009   Method used:   [facility] to        [facility] [id number]       [address]       [place], [place]  [address]       Ph: [phone]       Fax: [phone]   RxID:   [id number]     Appended Document: tdap documentation    Clinical Lists Changes  Orders: Added new Service order of Tdap => 45yrs IM [id number]) - Signed Added new Service order of Admin 1st Vaccine [phone]) - Signed Observations: Added new observation of TD BOOST VIS: 02/08/07 version given October 25, 2009. (10/25/2009 9:48) Added new observation of TD BOOSTERLO: [phone] (10/25/2009 9:48) Added new observation of TD BOOST EXP: 06/14/2011 (10/25/2009 9:48) Added new observation of TD BOOSTERBY: Brad Poisson CNA/MA (10/25/2009 9:48) Added new observation of TD BOOSTERRT: IM (10/25/2009 9:48) Added new observation of TDBOOSTERDSE: 0.5 ml (10/25/2009 9:48) Added new observation of TD BOOSTERMF: GlaxoSmithKline (10/25/2009 9:48) Added new observation of TD BOOST SIT: right  deltoid (10/25/2009 9:48) Added new observation of TD BOOSTER: Tdap (10/25/2009 9:48)       Immunizations Administered:  Tetanus Vaccine:    Vaccine Type: Tdap  Site: right deltoid    Mfr: GlaxoSmithKline    Dose: 0.5 ml    Route: IM    Given by: Brad Poisson CNA/MA    Exp. Date: 06/14/2011    Lot #: [id number]    VIS given: 02/08/07 version given October 25, 2009.

## 2010-04-22 NOTE — Letter (Signed)
Summary: [facility] Patient Note  [facility] Patient Note   Imported By: Jamelle Haring 04/17/2009 11:50:57  _____________________________________________________________________  External Attachment:    Type:   Image     Comment:   External Document

## 2010-04-22 NOTE — Letter (Signed)
Summary: [place]   Imported By: Phillis Knack 07/09/2009 11:43:41  _____________________________________________________________________  External Attachment:    Type:   Image     Comment:   External Document

## 2010-04-22 NOTE — Letter (Signed)
Summary: [facility]   Imported By: Rise Patience 09/25/2009 16:45:25  _____________________________________________________________________  External Attachment:    Type:   Image     Comment:   External Document

## 2010-04-22 NOTE — Letter (Signed)
Summary: Remote Device Check  [facility], [place]  [id number] [place]   [place], [place] [address]   Phone: [phone]  Fax: [phone]     June 04, 2009 MRN: [id number]   [place], [place]  [address]   Dear Mr. Beneke,   Your remote transmission was recieved and reviewed by your physician.  All diagnostics were within normal limits for you.    __X____Your next office visit is scheduled for:    MAY 2011 [place]. Please call our office to schedule an appointment.    Sincerely,  Hotel manager

## 2010-04-22 NOTE — Assessment & Plan Note (Signed)
Summary: cough with yellow sputum/mg   Visit Type:  Acute NP visit Primary Provider/Referring Provider:  Lenna Gilford  CC:  Pt c/o productive cough with white mucus x 1 day, nasal congestion, PND, wheezing, and chest congestion. Denies fever, bodyaches, and n/v/d. Treated with OTC Alka-Seltzer Cold Plus with some relief.  History of Present Illness: 65 year old male with known history of HTN, COPD., CAD, Brugada syndrome, Hyperlidemia,   08/07/2008--here for a follow up visit... he has mult med problems as noted... states that he has been doing well of late- no new complaints or concerns... he has had recent f/u evals from [name], Hilary Hertz, DrShadad, DrSethi--- note reviewed and discussed w/ the patient...   Aug 16, 2008--Presents for an acute office visit. Complains of 1 week of chest congestion with prod cough with white phlegm, occ SOB/wheezing x1week. No otc used. Denies chest pain, dyspnea at rest,  orthopnea, hemoptysis, fever, n/v/d, edema, headache,recent travel or antibiotics. Just not getting better, wearing him down. " I really need the steroid shot to fix this"  October 11, 2008 --Presents for a follow up. [facility] to Glipizide, unclear exact reason. No notes available. His last labs showed A1C  7.1>6.1 (5/10), scr 1.8>1.2 . Doing well , denies diarrhea, chest pain, polyuria/polydipsia, rash, abd pain, n/v. Does not check sugars at home.   November 08, 2008-Presents for DM follow up. Seen at [facility] recently. needs letter to [facility] stating that it is necessary for pt to be on metformin. [facility] wants to [facility] Metformin to Glipizide,  No notes available. His last labs showed A1C  7.1>6.1 (5/10), scr 1.8>1.2 . Doing well ,    ~  Aug 06, 2009:  here for a 25mo follow up visit> the [facility] changed his Antara to SIMVASTATIN 20mg ; and they changed his prev Metformin to Glimepiride, and now to GLYBURIDE 2.5mg ... as usual he did not bring his meds to the OV despite numerous requests to get  him to do so for [place] to review w/ him...  He saw [name] for Nephrology 12/10 for f/u of his chr renal insuffic- extensive labs done & Creat=1.2 (it was prev 1.5-1.8), BP controlled, no proteinuria, he knows to avoid NSAIDs... 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    October 25, 2009 -Pt presents for a work in [place] of scratch on back of right calf/ankle - happened 3 days ago. Would like tetanus shot. Pt was working in house, a door came off hinge and cut back of right calf. Has scabbed over but now has turned red, and is hot to touch. TD is >5 yrs ago. No drainage, fever, severe pain, rash. Denies chest pain, dyspnea, orthopnea, hemoptysis, fever, n/v/d, edema, headache,recent travel . He is a diabetic.   January 02, 2010 --Presents for an acute office visit. Complains of Pt c/o productive cough with white mucus x 3 day, nasal congestion, drainage  wheezing, and chest congestion. Denies fever, bodyaches, n/v/d. Treated with OTC Alka-Seltzer Cold Plus with some relief.Denies chest pain, dyspnea, orthopnea, hemoptysis, n/v/d, edema, headache,recent travel or antibiotics.    Preventive Screening-Counseling & Management  Alcohol-Tobacco     Smoking Status: quit     Year Quit: 2005     Pack years: 20  Current Medications (verified): 1)  Bayer Low Strength 81 Mg  Tbec (Aspirin) .... Take 1 Tablet Two Times A Day 2)  Aggrenox 25-200 Mg  Cp12 (Aspirin-Dipyridamole) .... Take 1 Capsule By Mouth Two Times A Day 3)  Metoprolol Tartrate 25 Mg Tabs (Metoprolol Tartrate) .... 1/2 Tab By Mouth Two Times A Day 4)  Simvastatin 20 Mg Tabs (Simvastatin) .... Take One Half Tablet By Mouth Daily At Bedtime 5)  Glyburide 2.5 Mg Tabs (Glyburide) .... 1/2 Tab By Mouth Once Daily 6)  Keppra 500 Mg  Tabs (Levetiracetam) .Marland Kitchen.. 1 Tab By Mouth Two Times A Day 7)  Multivitamins   Tabs (Multiple Vitamin)  .... Take One By Mouth Once Daily  Allergies (verified): 1)  ! 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 Jude single chamber defibrillator 08/17/2006  Review of Systems      See HPI  Vital Signs:  Patient profile:   65 year old male Height:      69 inches Weight:      208 pounds BMI:     30.83 O2 Sat:      93 % on Room air Temp:     97.7 degrees F oral Pulse rate:   73 / minute BP sitting:   134 / 90  (left arm) Cuff size:   regular  Vitals Entered By: Iran Planas CMA (January 02, 2010 3:21 PM)  O2 Flow:  Room air CC: Pt c/o productive cough with white mucus x 1 day, nasal congestion, PND, wheezing, and chest congestion. Denies fever, bodyaches, n/v/d. Treated with OTC Alka-Seltzer Cold Plus with some relief Is Patient Diabetic? Yes Comments Medications reviewed with patient Verified contact number and pharmacy with patient Iran Planas CMA  January 02, 2010 3:28 PM    Physical Exam  Additional Exam:  WD, WN, 65 y/o BM in NAD... GENERAL:  Alert & oriented; pleasant & cooperative... HEENT:  [place]/AT, EACs-clear, TMs-wnl, NOSE-clear, THROAT-clear & wnl. NECK:  Supple w/ fairROM; no JVD; normal carotid impulses w/o bruits; no thyromegaly or nodules palpated; no lymphadenopathy. CHEST:  CTA bilaterally HEART:  regular, gr 1/6 SEM without rubs or gallops heard... ABDOMEN:  Soft &  nontender; normal bowel sounds; no organomegaly or masses detected. EXT: without deformities or arthritic changes; no varicose veins/ venous insuffic/ or edema.     Impression & Recommendations:  Problem # 1:  UPPER RESPIRATORY INFECTION (ICD-465.9) URI w/ rhinitis flare  Xopenex neb tx in office  Plan:  Zyrtec 10mg  at bedtime for 5 days for drainage.  Saline nasal rinses as needed  Mucinex DM two times a day as needed cough/congestion  Nasonex  2 puffs two times a day until sample is gone.  Zpack to have on hold if symptoms worsen or do not improve w/ discolored mucus.  Please contact office for sooner follow up if symptoms do not improve or worsen   His updated medication list for this problem includes:    Bayer Low Strength 81 Mg Tbec (Aspirin) .Marland Kitchen... Take 1 tablet two times a day  Orders: Nebulizer Tx [id number]) Est. Patient Level III [id number])  Medications Added to Medication List This Visit: 1)  Simvastatin 20 Mg Tabs (Simvastatin) .... Take one half tablet by mouth daily at  bedtime 2)  Zithromax Z-pak 250 Mg Tabs (Azithromycin) .Marland Kitchen.. 1 by mouth once daily  Complete Medication List: 1)  Bayer Low Strength 81 Mg Tbec (Aspirin) .... Take 1 tablet two times a day 2)  Aggrenox 25-200 Mg Cp12 (Aspirin-dipyridamole) .... Take 1 capsule by mouth two times a day 3)  Metoprolol Tartrate 25 Mg Tabs (Metoprolol tartrate) .... 1/2 tab by mouth two times a day 4)  Simvastatin 20 Mg Tabs (Simvastatin) .... Take one half tablet by mouth daily at bedtime 5)  Glyburide 2.5 Mg Tabs (Glyburide) .... 1/2 tab by mouth once daily 6)  Keppra 500 Mg Tabs (Levetiracetam) .Marland Kitchen.. 1 tab by mouth two times a day 7)  Multivitamins Tabs (Multiple vitamin) .... Take one by mouth once daily 8)  Zithromax Z-pak 250 Mg Tabs (Azithromycin) .Marland Kitchen.. 1 by mouth once daily  Patient Instructions: 1)  Zyrtec 10mg  at bedtime for 5 days for drainage.  2)  Saline nasal rinses as needed  3)  Mucinex DM two times a day as  needed cough/congestion  4)  Nasonex  2 puffs two times a day until sample is gone.  5)  Zpack to have on hold if symptoms worsen or do not improve w/ discolored mucus.  6)  Please contact office for sooner follow up if symptoms do not improve or worsen  Prescriptions: ZITHROMAX Z-PAK 250 MG TABS (AZITHROMYCIN) 1 by mouth once daily  #1 x 0   Entered and Authorized by:   Rexene Edison NP   Signed by:   Valetta Mulroy NP on 01/02/2010   Method used:   Print then Give to Patient   RxID:   [id number]    Immunization History:  Influenza Immunization History:    Influenza:  historical (12/09/2009)     Medication Administration  Medication # 1:    Medication: Xopenex 1.25mg     Diagnosis: UPPER RESPIRATORY INFECTION (ICD-465.9)    Dose: 1 vial    Route: inhaled    Exp Date: 08/12    Lot #: [id number]    Mfr: Sepracor    Patient tolerated medication without complications    Given by: Iran Planas CMA (January 02, 2010 4:39 PM)  Orders Added: 1)  Nebulizer Tx [id number] 2)  Est. Patient Level III [id number]

## 2010-04-22 NOTE — Letter (Signed)
Summary: Remote Device Check  [facility], [place]  [id number] [place]   [place], [place] [address]   Phone: [phone]  Fax: [phone]     November 29, 2009 MRN: [id number]   [place], [place]  [address]   Dear Mr. Birden,   Your remote transmission was recieved and reviewed by your physician.  All diagnostics were within normal limits for you.  __X___Your next transmission is scheduled for:  02-13-2010.  Please transmit at any time this day.  If you have a wireless device your transmission will be sent automatically.   Sincerely,  Shelly Bombard

## 2010-04-22 NOTE — Progress Notes (Signed)
Summary: tetanus  Phone Note Call from Patient Call back at Home Phone [phone]   Caller: Patient Call For: nadel Reason for Call: Talk to Nurse Summary of Call: pt wants to know when his last tetanus shot was.  He got scraped the other day and he's a diabetic.  About a 4 inch scratch - from a door hinge. Initial call taken by: Zigmund Gottron,  October 24, 2009 9:39 AM  Follow-up for Phone Call        Called for paper chart to review immunization records. Doroteo Glassman RN  October 24, 2009 10:00 AM   Additional Follow-up for Phone Call Additional follow up Details #1::        Unable to find recent Tetanus shot.  Will need ov with Tamy Parrett for assessment of leg and possible Tetanus shot.  LMOMTCB x1. Doroteo Glassman RN  October 24, 2009 11:52 AM   Pt given appt with Tammy Parrett 10-25-09 at 9:00. Doroteo Glassman RN  October 24, 2009 12:01 PM

## 2010-04-22 NOTE — Cardiovascular Report (Signed)
Summary: [facility]   [facility]   Imported By: Sallee Provencal 08/23/2009 15:41:02  _____________________________________________________________________  External Attachment:    Type:   Image     Comment:   External Document

## 2010-04-22 NOTE — Letter (Signed)
Summary: Device-Delinquent Phone Proofreader, [place]  [address] [place]   [place], [place] [address]   Phone: [phone]  Fax: [phone]     February 17, 2010 MRN: [id number]   [place], [place]  [address]   Dear Mr. Corning,  According to our records, you were scheduled for a device phone transmission on 02-13-2010.     We did not receive any results from this check.  If you transmitted on your scheduled day, please call us to help troubleshoot your system.  If you forgot to send your transmission, please send one upon receipt of this letter.  Thank you,   [facility]

## 2010-04-22 NOTE — Cardiovascular Report (Signed)
Summary: Office Visit Remote   Office Visit Remote   Imported By: Sallee Provencal 12/03/2009 10:06:24  _____________________________________________________________________  External Attachment:    Type:   Image     Comment:   External Document

## 2010-04-22 NOTE — Letter (Signed)
Summary: Device-Delinquent Phone Proofreader, Lynd  [address] [place]   [place], [place] [address]   Phone: [phone]  Fax: [phone]     May 17, 2009 MRN: [id number]   [place],   [id number]   Dear Mr. Prohaska,  According to our records, you were scheduled for a device phone transmission on  April 29, 2009.     We did not receive any results from this check.  If you transmitted on your scheduled day, please call us to help troubleshoot your system.  If you forgot to send your transmission, please send one upon receipt of this letter.  Thank you,   [facility]

## 2010-04-22 NOTE — Assessment & Plan Note (Signed)
Summary: 65 YR F/U   Primary Provider:  Lenna Gilford  CC:  no complaints.  History of Present Illness: Brad Brown is a pleasant gentleman who has a history of coronary disease with a prior small myocardial infarction in 43.  However, he was admitted to [facility] in May 2008, after a seizure.  The patient ruled out for myocardial infarction with serial enzymes, but an electrocardiogram demonstrated Brugada syndrome.  A cardiac catheterization on Aug 17, 2006, showed normal coronary arteries.  Note, his LV function has been normal in the past.  He ultimately had an ICD placed by Dr. Lovena Le. Renal Dopplers performed in June of 2008 were normal. Echocardiogram in August of 2005 showed an ejection fraction of 50-55%, trivial aortic insufficiency and mitral regurgitation. There was mild left atrial enlargement. 65 last saw him in May of 2010. Since then he has done well symptomatically. He denies any dyspnea, chest pain, palpitations, syncope or ICD discharges.  Current Medications (verified): 1)  Bayer Low Strength 81 Mg  Tbec (Aspirin) .... Take 1 Tablet Two Times A Day 2)  Aggrenox 25-200 Mg  Cp12 (Aspirin-Dipyridamole) .... Take 1 Capsule By Mouth Two Times A Day 3)  Metoprolol Tartrate 25 Mg Tabs (Metoprolol Tartrate) .Marland Kitchen.. 1 Tab By Mouth Once Daily 4)  Glyburide 2.5 Mg Tabs (Glyburide) .Marland Kitchen.. 1 Tab By Mouth Once Daily 5)  Keppra 500 Mg  Tabs (Levetiracetam) .Marland Kitchen.. 1 Tab By Mouth Two Times A Day 6)  Multivitamins   Tabs (Multiple Vitamin) .... Take One By Mouth Once Daily 7)  Simvastatin 20 Mg Tabs (Simvastatin) .... Take One Tablet By Mouth Daily At Bedtime  Allergies: 1)  ! Lipitor (Atorvastatin Calcium)  Past History:  Past Medical History: Reviewed history from 11/63/2010 and no changes required.  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  Catheterization in 1987 showed distal left circumflex 100% occluded  implantation of a St. Jude single chamber defibrillator 08/17/2006   Social History: Reviewed history from 05/63/2010 and no changes required.  The patient lives in [place] alone.  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 does not partake of alcoholic beverages.  exsmoker ---quit in 2006---1/2 ppd smoked on and off for 15 years  Review of Systems       no fevers or chills, productive cough, hemoptysis, dysphasia, odynophagia, melena, hematochezia, dysuria, hematuria, rash, seizure activity, orthopnea, PND, pedal edema, claudication. Remaining systems are negative.   Vital Signs:  Patient profile:   65 year old male Height:      69 inches Weight:      202 pounds BMI:     29.94 Pulse rate:   59 / minute Resp:     14 per minute BP sitting:   120 / 78  (left arm)  Vitals Entered By: Burnett Kanaris (Jul 24, 2009 9:37 AM)  Physical Exam  General:  Well-developed well-nourished in no acute distress.  Skin is warm and dry.  HEENT is normal.  Neck is supple. No thyromegaly.  Chest is clear to auscultation with normal expansion.  Cardiovascular exam is regular rate and rhythm.  Abdominal exam nontender or distended. No masses palpated. Extremities show no edema. neuro grossly intact  EKG  Procedure date:  07/24/2009  Findings:      Sinus rhythm at a rate of 59. First degree AV block. RV conduction delay. Cannot rule out prior inferior infarct.   ICD Specifications ICD Vendor:  St Jude     ICD Model Number:  [id number]     ICD Serial Number:  [id number] ICD DOI:   08/17/2006     ICD Implanting MD:  Cristopher Peru, MD  Lead 1:    Location: RV     DOI: 08/17/2006     Model #: [id number]     Serial #: [id number]     Status: active  Indications::  BRUGADA   Episodes Coumadin:  No  Brady Parameters Mode VVI     Lower Rate Limit:  40      Tachy Zones VF:  240     VT:  200     VT1:  171     Impression & Recommendations:  Problem # 1:  CAD (ICD-414.00) Continue aspirin, beta blocker and statin. His updated medication list for this problem includes:    Bayer Low Strength 81 Mg Tbec (Aspirin) .Marland Kitchen... Take 1 tablet two times a day    Aggrenox 25-200 Mg Cp12 (Aspirin-dipyridamole) .Marland Kitchen... Take 1 capsule by mouth two times a day    Metoprolol Tartrate 25 Mg Tabs (Metoprolol tartrate) .Marland Kitchen... 1 tab by mouth once daily  Problem # 2:  HYPERTENSION, HX OF (ICD-V12.50) Blood pressure controlled on present medications. Will continue.  Problem # 3:  OTHER SPECIFIED CONGENITAL ANOMALY HEART OTHER [phone]) Patient with history of Brugada syndrome. ICD in place. Management per EP. His updated medication list for this problem includes:    Bayer Low Strength 81 Mg Tbec (Aspirin) .Marland Kitchen... Take 1 tablet two times a day    Aggrenox 25-200 Mg Cp12 (Aspirin-dipyridamole) .Marland Kitchen... Take 1 capsule by mouth two times a day    Metoprolol Tartrate 25 Mg Tabs (Metoprolol tartrate) .Marland Kitchen... 1 tab by mouth once daily  Problem # 4:  HYPERLIPIDEMIA (ICD-272.4) Continue present medications. Lipids and liver monitor by primary care. The following medications were removed from the medication list:    Antara 130 Mg Caps (Fenofibrate micronized) .Marland Kitchen... Take 1 capsule by mouth once a day His updated medication list for this problem includes:    Simvastatin 20 Mg Tabs (Simvastatin) .Marland Kitchen... Take one tablet by mouth daily at bedtime  Problem # 5:  DIABETES MELLITUS, BORDERLINE (ICD-790.29) Management per primary care.  Problem # 6:  Hx of RENAL INSUFFICIENCY (ICD-588.9) Renal function monitored by primary  care.  Problem # 7:  SEIZURE DISORDER (ICD-780.39)  His updated medication list for this problem includes:    Keppra 500 Mg Tabs (Levetiracetam) .Marland Kitchen... 1 tab by mouth two times a day  Patient Instructions: 1)  Your physician recommends that you schedule a follow-up appointment in: [place]

## 2010-04-22 NOTE — Assessment & Plan Note (Signed)
Summary: 6 months/apc   Primary Care Provider:  Lenna Gilford   History of Present Illness: 65 y/o BM here for a follow up visit... he has mult med problems as noted & he has mult specialty physicians tending to his medical needs... he gets his meds from the [facility]...   ~  Aug 06, 2009:  here for a 33mo follow up visit> the [facility] changed his Antara to SIMVASTATIN 20mg ; and they changed his prev Metformin to Glimepiride, and now to GLYBURIDE 2.5mg ... as usual he did not bring his meds to the OV despite numerous requests to get him to do so for [place] to review w/ him...  He saw [name] for Nephrology 12/10 for f/u of his chr renal insuffic- extensive labs done & Creat=1.2 (it was prev 1.5-1.8), BP controlled, no proteinuria, he knows to avoid NSAIDs... 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 He saw [name] for Cards 5/11 for f/u of his hx prior minor MI in [place] syndrome on EKG ident in 2008 (cath showed normal coronaries) & ICD placed by [name]... he is asymptomatic- no CP, palpit, discharges, dyspnea, edema, etc... no changes made to his meds.    Current Problem List:  COPD - exsmoker quit  ~5 years now "I learned my lesson"... no regular meds... denies recurrent symptoms ~ denies cough, sputum, hemoptysis, worsening dyspnea, wheezing, chest pain, snoring, daytime hypersomnolence, etc...  HBP, CAD & BRUGADA Syndrome - followed by Hilary Hertz & last seen 5/11 ~ note reviewed... 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 Myoview 2/08 showed sm inferolat infarct w/o ischemia & EF=59%... hosp 5/08 w/ seizure &  EKG showing Brugada syndrome- cath w/ normal coronaries, good LVF, and defibrillator implanted... he remains on ASA, & METOPROLOL 50mg - [id number]...  ~  5/10: BP= 138/80 & even better at home, he says... no CP,  palpit, dizzy, etc...  ~  11/10: BP= 128/80 & feeling well- no CP, palpit, dizzy, etc...  ~  5/11:  BP= 132/78 & feeling well- no complaints or concerns...  HYPERLIPIDEMIA - prev w/ fair control on Antara, but [facility] switched him to SIMVASTATIN 20mg /d.  ~  FLP 9/08 showed TChol 130, TG 112, HDL 43, LDL 65  ~  FLP 5/09 showed TChol 121, TG 93, HDL 36, LDL 67  ~  FLP 11/09 on Antara130 showed TChol 131, TG 150, HDL 40, LDL 61  ~  FLP 5/10 on Antara130 showed TChol 143, TG 140, HDL 45, LDL 70  ~  FLP 11/10 on Antara130 showed TChol 143, TG 194, HDL 46, LDL 59... same med, better diet; but [facility] to Simva20.  ~  FLP 5/11 on Simva20 showed TChol 122, TG 193, HDL 41, LDL 43  DIABETES MELLITUS, BORDERLINE (ICD-790.29) - prev on Metformin but [facility] changed to Glimep2mg , then to GLYBURIDE 2.5mg - taking 1/2 tab Qam.  ~  labs 11/09 on diet showed BS= 116, A1c= 7.1.Marland Kitchen. rec> start Metformin 500mg /d.  ~  labs 5/10 on Metform500 showed BS= 96, A1c= 6.1.Marland KitchenMarland Kitchen continue same.  ~  [facility] changed pt to Gimepiride 2mg /d...  ~  labs 11/10 on Glimep2 showed BS= 78, A1c= 5.9.Marland Kitchen. rec> decr Glimep to 1/2 tab in AM; [facility] ch to Glybur2.5mg .  ~  labs 5/11 on Glybur2.5- 1/2 tab daily showed BS= 79, A1c= 6.0  Hx of  RENAL INSUFFICIENCY - baseline Creat = 1.8.Marland Kitchen. he has hx of renal insuffic related to ACE/ ARB therapy & improved to baseline off this Rx... followed  by [facility]- notes & labs reviewed.  ~  labs 2/09 by DrShadad w/ Cr=1.6  ~  labs 9/09 by [facility] showed BUN= 20, Creat= 1.65  ~  labs 11/09 here showed BUN= 12, Creat= 1.6  ~  labs 5/10 showed BUN= 14, creat= 1.2  ~  labs 11/10 showed BUN= 14, Creat= 1.1  ~  labs 5/11 showed BUN= 13, Creat= 1.2  Hx STROKE & SEIZURE DISORDER - eval by neuro/ DrSethi... he remains on ASA 81mg Bid,  AGGRENOX Bid, & KEPPRA 500mg Bid... he is stable without focal weakness, sensory changes, speech problems, etc...   ~  5/10 he denies memory problems, but in need of additional evaluation,  DrSethi's note 12/09 doesn't indicate difficulty in this area.  ~  11/10: continues to deny problems...  ~  5/11:  states he had a recent seizure and is due for f/u w/ DrSethi soon... I am still concerned about his memory & affect.  MONOCLONAL GAMMOPATHY - full eval from DrShadad and latest notes reviewed... of interest the pt tells me he has been a regular blood donor and freq gave "double units"... 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 he has some free kappa light chains in the urine as well...  ~  4/11:  f/u DrShadad w/ extensive labs reviewed> no evid progression, continues on observation.   Allergies: 1)  ! Lipitor (Atorvastatin Calcium)  Comments:  Nurse/Medical Assistant: The patient's medications and allergies were reviewed with the patient and were updated in the Medication and Allergy Lists.  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 Jude single chamber defibrillator 08/17/2006 1  Family History: Reviewed history from 07/24/2008 and no changes required. The patient has seven brothers and three sisters. One   brother died young of a myocardial infarction.      Social History: Reviewed history from 08/07/2008 and no changes required.  The patient lives in [place] alone.  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 does not partake of alcoholic beverages.  exsmoker ---quit in 2006---1/2 ppd smoked on and off for 15 years  Review of Systems      See HPI       The patient complains of headaches.  The patient denies anorexia, fever, weight loss, weight gain, vision loss, decreased hearing, hoarseness, chest pain, syncope, dyspnea on exertion, peripheral edema, prolonged cough, hemoptysis, abdominal pain, melena, hematochezia, severe indigestion/heartburn, hematuria, incontinence, muscle weakness, suspicious skin lesions, transient blindness, difficulty walking, depression, unusual weight change, abnormal bleeding, enlarged lymph nodes, and angioedema.    Vital Signs:  Patient profile:   65 year old male Height:      69 inches Weight:      204 pounds BMI:     30.23 O2 Sat:      93 % on  Room air Temp:     96.9 degrees F oral Pulse rate:   68 / minute BP sitting:   132 / 78  (right arm) Cuff size:   regular  Vitals Entered By: Elita Boone CMA (Aug 05, 2009 9:36 AM)  O2 Sat at Rest %:  93 O2 Flow:  Room air CC: 6 month follow up--fasting today--needs refills of meds for #90 day supply Is Patient Diabetic? No Pain Assessment Patient in pain? no      Comments no changes in meds   Physical Exam  Additional Exam:  WD, WN, 65 y/o BM in NAD... GENERAL:  Alert & oriented; pleasant & cooperative... HEENT:  /AT, EOM-wnl, PERRLA, EACs-clear, TMs-wnl, NOSE-clear, THROAT-clear & wnl. NECK:  Supple w/ fairROM; no JVD; normal carotid impulses w/o bruits; no thyromegaly or nodules palpated; no lymphadenopathy. CHEST:  Clear to P & A; without wheezes/ rales/ or rhonchi. HEART:  regular, gr 1/6 SEM without rubs or gallops heard... ABDOMEN:  Soft & nontender; normal bowel sounds; no organomegaly or masses detected. EXT: without deformities or arthritic changes; no varicose veins/ venous insuffic/ or edema. NEURO:  CN's intact; motor testing  normal; sensory testing normal; gait normal & balance OK. DERM:  No lesions noted; no rash etc...    CXR  Procedure date:  08/05/2009  Findings:      CHEST - 2 VIEW Comparison: Chest x-ray of 08/18/2006   Findings: The lungs are clear and slightly hyperaerated.  The heart is mildly enlarged and stable.  A permanent pacemaker with AICD lead remain.   IMPRESSION: Stable chest x-ray.  No active lung disease.   Read By:  Joretta Bachelor,  M.D.    MISC. Report  Procedure date:  08/05/2009  Findings:      BMP (METABOL)   Sodium                    140 mEq/L                   135-145   Potassium                 4.5 mEq/L                   3.5-5.1   Chloride                  108 mEq/L                   96-112   Carbon Dioxide            28 mEq/L                    19-32   Glucose                   79 mg/dL                    70-99   BUN                       13 mg/dL                    6-23   Creatinine                1.2 mg/dL                   0.4-1.5  Calcium                   9.3 mg/dL                   8.4-10.5   GFR                       79.78 mL/min                >60  Hepatic/Liver Function Panel (HEPATIC)   Total Bilirubin      [H]  1.5 mg/dL                   0.3-1.2   Direct Bilirubin          0.2 mg/dL                   0.0-0.3   Alkaline Phosphatase      49 U/L                      39-117   AST                       23 U/L                      0-37   ALT                       31 U/L                      0-53   Total Protein             7.3 g/dL                    6.0-8.3   Albumin                   4.2 g/dL                    3.5-5.2  Lipid Panel (LIPID)   Cholesterol               122 mg/dL                   0-200   Triglycerides        [H]  193.0 mg/dL                 0.0-149.0   HDL                       40.80 mg/dL                 >39.00   LDL Cholesterol           43 mg/dL                    0-99  Comments:      TSH (TSH)   FastTSH                    2.70 uIU/mL                 0.35-5.50  Hemoglobin A1C (A1C)   Hemoglobin A1C            6.0 %  4.6-6.5  Prostate Specific Antigen (PSA)   PSA-Hyb                   0.78 ng/mL                  0.10-4.00    Impression & Recommendations:  Problem # 1:  COPD (B4882018) Ex-smoker, CXR clear & stable, denies breathing problems... Orders: T-2 View CXR ([id number])  Problem # 2:  MI (ICD-410.90) Hx remote MI, normal coronaries on cath 2008, Brugada syndrome>  followed by Hilary Hertz & Lovena Le... His updated medication list for this problem includes:    Bayer Low Strength 81 Mg Tbec (Aspirin) .Marland Kitchen... Take 1 tablet two times a day    Aggrenox 25-200 Mg Cp12 (Aspirin-dipyridamole) .Marland Kitchen... Take 1 capsule by mouth two times a day    Metoprolol Tartrate 25 Mg Tabs (Metoprolol tartrate) .Marland Kitchen... 1 tab by mouth once daily  Problem # 3:  HYPERLIPIDEMIA (ICD-272.4) Meds changed by [facility], but FLP is similar & rec for low fat diet, continue same med. His updated medication list for this problem includes:    Simvastatin 20 Mg Tabs (Simvastatin) .Marland Kitchen... Take one tablet by mouth daily at bedtime  Orders: TLB-BMP (Basic Metabolic Panel-BMET) (99991111) TLB-Hepatic/Liver Function Pnl (80076-HEPATIC) TLB-Lipid Panel (80061-LIPID) TLB-TSH (Thyroid Stimulating Hormone) (84443-TSH) TLB-A1C / Hgb A1C (Glycohemoglobin) (83036-A1C) TLB-PSA (Prostate Specific Antigen) (84153-PSA)  Problem # 4:  DIABETES MELLITUS, BORDERLINE (ICD-790.29) Meds changed by [facility] to Glyburide 2.5mg  taking 1/2 tab Qam... BS tightly controlled & A1c= 6.0.Marland KitchenMarland Kitchen I would prefer Metformin Rx but why [facility] changed to sulfonylurea???  OK to continue low dose, watch for hypoglycemia. His updated medication list for this problem includes:    Glyburide 2.5 Mg Tabs (Glyburide) .Marland Kitchen... 1/2 tab by mouth once daily  Problem # 5:  GERD (ICD-530.81) GI is stable-  ?when is next colonoscopy due?  Problem # 6:  Hx of RENAL INSUFFICIENCY  (ICD-588.9) Renal function remains normal... ?why he continues the f/u w/ Nephrology?  Problem # 7:  SEIZURE DISORDER (ICD-780.39) He is due for a follow up w/ DrSethi... 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 His updated medication list for this problem includes:    Keppra 500 Mg Tabs (Levetiracetam) .Marland Kitchen... 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  Complete Medication List: 1)  Bayer Low Strength 81 Mg Tbec (Aspirin) .... Take 1 tablet two times a day 2)  Aggrenox 25-200 Mg Cp12 (Aspirin-dipyridamole) .... Take 1 capsule by mouth two times a day 3)  Metoprolol Tartrate 25 Mg Tabs (Metoprolol tartrate) .Marland Kitchen.. 1 tab by mouth once daily 4)  Simvastatin 20 Mg Tabs (Simvastatin) .... Take one tablet by mouth daily at bedtime 5)  Glyburide 2.5 Mg Tabs (Glyburide) .... 1/2 tab by mouth once daily 6)  Keppra 500 Mg Tabs (Levetiracetam) .Marland Kitchen.. 1 tab by mouth two times a day 7)  Multivitamins Tabs (Multiple vitamin) .... Take one by mouth once daily  Patient Instructions: 1)  Today we updated your med list- see below.... 2)  Continue your current medications the same... 3)  Today we did your follow up CXR & FASTING blood work... 4)  please call the "phone tree" in a few days for your lab results.Marland KitchenMarland Kitchen  5)  Call for any problems.Marland KitchenMarland Kitchen 6)  Please schedule a follow-up appointment in 6 months.   Immunization History:  Influenza Immunization History:    Influenza:  historical (01/02/2009)  Pneumovax Immunization History:    Pneumovax:  historical (01/11/2008)

## 2010-04-22 NOTE — Cardiovascular Report (Signed)
Summary: Office Visit Remote   Office Visit Remote   Imported By: Sallee Provencal 06/05/2009 11:17:11  _____________________________________________________________________  External Attachment:    Type:   Image     Comment:   External Document

## 2010-04-22 NOTE — Letter (Signed)
Summary: [place]   Imported By: Phillis Knack 01/24/2010 10:00:58  _____________________________________________________________________  External Attachment:    Type:   Image     Comment:   External Document

## 2010-04-22 NOTE — Assessment & Plan Note (Signed)
Summary: 6 months/apc   Primary Care Provider:  Lenna Gilford  CC:  6 month ROV & review of mult medical problems....  History of Present Illness: 65 y/o BM here for a follow up visit... he has mult med problems as noted & he has mult specialty physicians tending to his medical needs... he gets his meds from the [facility]...   ~  Aug 06, 2009:  here for a 26mo follow up visit> the [facility] changed his Antara to SIMVASTATIN 20mg ; and they changed his prev Metformin to Glimepiride, and now to GLYBURIDE 2.5mg ... as usual he did not bring his meds to the OV despite numerous requests to get him to do so for [place] to review w/ him...    He saw [name] for Nephrology 12/10 for f/u of his chr renal insuffic- extensive labs done & Creat=1.2 (it was prev 1.5-1.8), BP controlled, no proteinuria, he knows to avoid NSAIDs...   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   He saw [name] for Cards 5/11 for f/u of his hx prior minor MI in [place] syndrome on EKG ident in 2008 (cath showed normal coronaries) & ICD placed by [name]... he is asymptomatic- no CP, palpit, discharges, dyspnea, edema, etc... no changes made to his meds.   ~  February 03, 2010:  he's had a good 60mo- "same" he says (he is here by himself once again today)... he didn't bring meds (again) & didn't bring list... he continues to f/u w/ DrShadad (10/11- MGUS, no change), [name] (6/11- Creat=1.2, improved), [name] (5/11- Brugada syndrome, norm coronaries on cath, stable)... he states that his BS is "doin pretty good";  he remains on ASA, Keppra, & Aggrenox- no seizures reported... see labs below:    Current Problem List:  COPD - exsmoker quit >5 years now "I learned my lesson"... no regular meds... denies recurrent symptoms ~ denies cough, sputum, hemoptysis, worsening dyspnea, wheezing, chest pain, snoring, daytime hypersomnolence,  etc...  HBP, CAD & BRUGADA Syndrome - followed by Hilary Hertz & his notes are reviewed... 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 Myoview 2/08 showed sm inferolat infarct w/o ischemia & EF=59%... hosp 5/08 w/ seizure &  EKG showing Brugada syndrome- cath w/ normal coronaries, good LVF, and defibrillator implanted... he remains on ASA, & METOPROLOL 50mg - 1/2Bid...  ~  5/10: BP= 138/80 & even better at home, he says... no CP, palpit, dizzy, etc...  ~  11/10: BP= 128/80 & feeling well- no CP, palpit, dizzy, etc...  ~  5/11:  BP= 132/78 & feeling well- no complaints or concerns...  ~  11/11:  BP= 120/80 & he is asymptomatic...  HYPERLIPIDEMIA - prev w/ fair control on Antara, but [facility] switched him to SIMVASTATIN 20mg /d.  ~  FLP 9/08 showed TChol 130, TG 112, HDL 43, LDL 65  ~  FLP 5/09 showed TChol 121, TG 93, HDL 36, LDL 67  ~  FLP 11/09 on Antara130 showed TChol 131, TG 150, HDL 40, LDL 61  ~  FLP 5/10 on Antara130 showed TChol 143, TG 140, HDL 45, LDL 70  ~  FLP 11/10 on Antara130 showed TChol 143, TG 194, HDL 46, LDL 59... same med, better diet; but [facility] ch to Simva20.  ~  FLP 5/11 on Simva20 showed TChol 122, TG 193, HDL 41, LDL  43  ~  FLP 11/11 on Simva20 showed TChol 137, TG 228, HDL 42, LDL 50... needs better low fat diet.  DIABETES MELLITUS, BORDERLINE (ICD-790.29) - prev on Metformin but [facility] changed to Glimep2mg , then to GLYBURIDE 2.5mg - taking 1/2 tab Qam.  ~  labs 11/09 on diet showed BS= 116, A1c= 7.1.Marland Kitchen. rec> start Metformin 500mg /d.  ~  labs 5/10 on Metform500 showed BS= 96, A1c= 6.1.Marland KitchenMarland Kitchen continue same.  ~  [facility] changed pt to Gimepiride 2mg /d...  ~  labs 11/10 on Glimep2 showed BS= 78, A1c= 5.9.Marland Kitchen. rec> decr Glimep to 1/2 tab in AM; [facility] ch to Glybur2.5mg .  ~  labs 5/11 on Glybur2.5- 1/2 tab showed BS= 79, A1c= 6.0  ~  labs 11/11 on Glybur2.5-1/2tab showed BS= 99, A1c= 6.4  Hx of RENAL INSUFFICIENCY -  baseline Creat = 1.8.Marland Kitchen. he has hx of renal insuffic related to ACE/ ARB therapy & improved to baseline off this Rx... followed  by [facility]- notes & labs reviewed.  ~  labs 2/09 by DrShadad w/ Cr=1.6  ~  labs 9/09 by [facility] showed BUN= 20, Creat= 1.65  ~  labs 11/09 here showed BUN= 12, Creat= 1.6  ~  labs 5/10 showed BUN= 14, creat= 1.2  ~  labs 11/10 showed BUN= 14, Creat= 1.1  ~  labs 5/11 showed BUN= 13, Creat= 1.2  ~  labs 11/11 showed BUN= 17, Creat= 1.3  Hx STROKE & SEIZURE DISORDER - eval by neuro/ DrSethi... he remains on ASA 81mg Bid,  AGGRENOX Bid, & KEPPRA 500mg Bid... he is stable without focal weakness, sensory changes, speech problems, etc...   ~  5/10 he denies memory problems, but in need of additional evaluation, DrSethi's note 12/09 doesn't indicate difficulty in this area.  ~  11/10: continues to deny problems...  ~  5/11:  states he had a recent seizure and is due for f/u w/ DrSethi soon... I am still concerned about his memory & affect.  ~  11/11:  we do not have notes from Neuro to review...  MONOCLONAL GAMMOPATHY - full eval from DrShadad and latest notes reviewed... of interest the pt tells me he has been a regular blood donor and freq gave "double units"... 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 he has some free kappa light chains in the urine as well...  ~  10/11:  f/u DrShadad w/ extensive labs reviewed> no evid progression, continues on observation.   Preventive Screening-Counseling & Management  Alcohol-Tobacco     Smoking Status: quit     Packs/Day: 0.5     Year Quit: 2005     Pack years: 20  Allergies: 1)  ! Lipitor (Atorvastatin Calcium)  Comments:  Nurse/Medical Assistant: The patient's medications and allergies were reviewed with the patient and were updated  in the Medication and Allergy Lists.  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 Jude single chamber defibrillator 08/17/2006  Family History: Reviewed history from 07/24/2008 and no changes required. The patient  has seven brothers and three sisters. One   brother died young of a myocardial infarction.   Social History: Reviewed history from 08/07/2008 and no changes required.  The patient lives in [place] alone.  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 does not partake of alcoholic beverages.  exsmoker ---quit in 2006---1/2 ppd smoked on and off for 15 years Packs/Day:  0.5  Review of Systems      See HPI  The patient denies anorexia, fever, weight loss, weight gain, vision loss, decreased hearing, hoarseness, chest pain, syncope, dyspnea on exertion, peripheral edema, prolonged cough, headaches, hemoptysis, abdominal pain, melena, hematochezia, severe indigestion/heartburn, hematuria, incontinence, muscle weakness, suspicious skin lesions, transient blindness, difficulty walking, depression, unusual weight change, abnormal bleeding, enlarged lymph nodes, and angioedema.    Vital Signs:  Patient profile:   65 year old male Height:      69 inches Weight:      206 pounds BMI:     30.53 O2 Sat:      98 % on Room air Temp:     96.9 degrees F oral Pulse rate:   64 / minute BP sitting:   120 / 80  (left arm) Cuff size:   regular  Vitals Entered  By: Elita Boone CMA (February 03, 2010 10:22 AM)  O2 Sat at Rest %:  98 O2 Flow:  Room air CC: 6 month ROV & review of mult medical problems... Is Patient Diabetic? Yes Pain Assessment Patient in pain? no      Comments meds updated today with pt   Physical Exam  Additional Exam:  WD, WN, 64 y/o BM in NAD... GENERAL:  Alert & oriented; pleasant & cooperative... HEENT:  [place]/AT, EOM-wnl, PERRLA, EACs-clear, TMs-wnl, NOSE-clear, THROAT-clear & wnl. NECK:  Supple w/ fairROM; no JVD; normal carotid impulses w/o bruits; no thyromegaly or nodules palpated; no lymphadenopathy. CHEST:  Clear to P & A; without wheezes/ rales/ or rhonchi. HEART:  regular, gr 1/6 SEM without rubs or gallops heard... ABDOMEN:  Soft & nontender; normal bowel sounds; no organomegaly or masses detected. EXT: without deformities or arthritic changes; no varicose veins/ venous insuffic/ or edema. NEURO:  CN's intact; motor testing normal; sensory testing normal; gait normal & balance OK. DERM:  No lesions noted; no rash etc...    MISC. Report  Procedure date:  02/03/2010  Findings:      BMP (METABOL)   Sodium                    137 mEq/L                   135-145   Potassium                 5.0 mEq/L                   3.5-5.1   Chloride                  105 mEq/L                   96-112   Carbon Dioxide            28 mEq/L                    19-32   Glucose                   99  mg/dL                    70-99   BUN                       17 mg/dL                    6-23   Creatinine                1.3 mg/dL                   0.4-1.5   Calcium                   9.4 mg/dL                   8.4-10.5   GFR                       74.56 mL/min                >60  Lipid Panel (LIPID)   Cholesterol               137 mg/dL                   0-200   Triglycerides        [H]  228.0 mg/dL                 0.0-149.0   HDL                       42.20 mg/dL                 >39.00  Cholesterol LDL - Direct                              49.7 mg/dL  Hemoglobin A1C (A1C)   Hemoglobin A1C            6.4 %                       4.6-6.5    Impression & Recommendations:  Problem # 1:  CARDIAC>>> Hx HBP, Hx MI, Hx Brugada's syndrome>  followed by Kizzie Ide- stable, continue same meds:  Metoprolol 12.5mg Bid, Aggrenox Bid, ASA Bid, & AICD implanted...  Problem # 2:  HYPERLIPIDEMIA (B2193296.4) Stable on Simva20 per [facility]... he has elev TG's & we reviewed low fat diet... His updated medication list for this problem includes:    Simvastatin 20 Mg Tabs (Simvastatin) .Marland Kitchen... Take one half tablet by mouth daily at bedtime  Orders: TLB-BMP (Basic Metabolic Panel-BMET) (99991111) TLB-Lipid Panel (80061-LIPID) TLB-A1C / Hgb A1C (Glycohemoglobin) (83036-A1C)  Problem # 3:  DIABETES MELLITUS, BORDERLINE (ICD-790.29) Stable on low dose Glyburide per [facility]... His updated medication list for this problem includes:    Glyburide 2.5 Mg Tabs (Glyburide) .Marland Kitchen... 1/2 tab by mouth once daily  Problem # 4:  Hx of RENAL INSUFFICIENCY (ICD-588.9) Followed by [name] & stable w/ Creat= 1.3 today...  Problem # 5:  SEIZURE DISORDER (ICD-780.39) Followed by DrSethi>  we don't have notes from him... His updated medication list for this problem includes:    Keppra 500 Mg Tabs (Levetiracetam) .Marland Kitchen... Take 1 tab by mouth two times a day...  Problem # 6:  OTHER  MEDICAL ISSUES AS NOTED>>>  Complete Medication List: 1)  Bayer Low Strength 81 Mg Tbec (Aspirin) .... Take 1 tablet two times a day 2)  Aggrenox 25-200 Mg Cp12 (Aspirin-dipyridamole) .... Take 1 capsule by mouth two times a day 3)  Metoprolol Tartrate 25 Mg Tabs (Metoprolol tartrate) .... 1/2 tab by mouth two times a day 4)  Simvastatin 20 Mg Tabs (Simvastatin) .... Take one half tablet by mouth daily at bedtime 5)  Glyburide 2.5 Mg Tabs (Glyburide) .... 1/2 tab by mouth once daily 6)  Keppra 500 Mg Tabs (Levetiracetam) .... Take 1 tab by mouth two times a day... 7)   Multivitamins Tabs (Multiple vitamin) .... Take one by mouth once daily  Patient Instructions: 1)  Today we updated your med list- see below.... 2)  Today we did your follow up fasting blood work... please call the "phone tree" in a few days for your lab results.Marland KitchenMarland Kitchen  3)  Let's get on track w/ our diet & exercise program> the goal is to lose weight...  4)  Call for any questions.Marland KitchenMarland Kitchen 5)  Please schedule a follow-up appointment in 6 months.

## 2010-04-22 NOTE — Progress Notes (Signed)
Summary: sick--cough with yellow sputum  Phone Note Call from Patient Call back at Home Phone [phone]   Caller: Patient Call For: nadel Summary of Call: pt thinks he has the flu no fever sick has a chest cold was going to put him on tammy until he said he has the flu Initial call taken by: Don Broach,  January 02, 2010 12:48 PM  Follow-up for Phone Call        called and spoke with pt.  pt requests to be seen today.  pt c/o chest congestion and coughing up yellow sputum.  Pt denied a fever.  Pt states symptoms started 2 to 3 days ago.  Pt scheduled to see TP today at 3:15pm.  Matthew Folks LPN  October 13, 624THL 1:20 PM

## 2010-04-22 NOTE — Assessment & Plan Note (Signed)
Summary: 1 yr f/u st jude   Visit Type:  Follow-up Primary Provider:  Lenna Gilford   History of Present Illness: Brad Brown is a pleasant gentleman who has a history of coronary disease with a prior small myocardial infarction in 1987.  However, he was admitted to [facility] in May 2008, after a seizure.  The patient ruled out for myocardial infarction with serial enzymes, but an electrocardiogram demonstrated Brugada syndrome.  A cardiac catheterization on Aug 17, 2006, showed normal coronary arteries.  Note, his LV function has been normal in the past.  He ultimately had an ICD placed.  He denies any dyspnea, chest pain, palpitations, syncope or ICD discharges.  No other complaints today.  Current Medications (verified): 1)  Bayer Low Strength 81 Mg  Tbec (Aspirin) .... Take 1 Tablet Two Times A Day 2)  Aggrenox 25-200 Mg  Cp12 (Aspirin-Dipyridamole) .... Take 1 Capsule By Mouth Two Times A Day 3)  Metoprolol Tartrate 25 Mg Tabs (Metoprolol Tartrate) .... 1/2 Tab By Mouth Two Times A Day 4)  Simvastatin 20 Mg Tabs (Simvastatin) .... Take One Tablet By Mouth Daily At Bedtime 5)  Glyburide 2.5 Mg Tabs (Glyburide) .... 1/2 Tab By Mouth Once Daily 6)  Keppra 500 Mg  Tabs (Levetiracetam) .Marland Kitchen.. 1 Tab By Mouth Two Times A Day 7)  Multivitamins   Tabs (Multiple Vitamin) .... Take One By Mouth Once Daily 8)  Antara 130 Mg Caps (Fenofibrate Micronized) .Marland Kitchen.. 1 Capsule Once Daily  Allergies (verified): 1)  ! 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 Jude single chamber defibrillator 08/17/2006  Review of Systems  The patient denies chest pain, syncope, dyspnea on exertion, and peripheral edema.    Vital Signs:  Patient profile:   65 year old male Height:      69 inches Weight:      201 pounds BMI:     29.79 Pulse rate:   55 / minute BP sitting:   140 / 88  (left arm)  Vitals Entered By: Margaretmary Bayley CMA (Aug 07, 2009 9:58 AM)  Physical Exam  General:  Well-developed well-nourished in no acute distress.  Skin is warm and dry.  HEENT is normal.  Neck is supple. No thyromegaly.  Chest is clear to auscultation with normal expansion. Well healed ICD incision. Cardiovascular exam is regular rate and rhythm.  Abdominal exam nontender or distended. No masses palpated. Extremities show no edema. neuro grossly intact    EKG  Procedure date:  08/07/2009  Findings:      Sinus bradycardia with rate of: 55.    ICD Specifications Following MD:  Cristopher Peru, MD     ICD Vendor:  [facility] Jude     ICD Model Number:  [phone]     ICD Serial Number:  [id number] ICD DOI:  08/17/2006     ICD Implanting MD:  Cristopher Peru, MD  Lead 1:    Location: RV     DOI: 08/17/2006     Model #: [id number]     Serial #: [id number]     Status: active  Indications::  BRUGADA   ICD Follow Up Battery Voltage:  3.14 V     Charge Time:  10.4 seconds     Battery Est. Longevity:  5.3-6.5yrs Underlying rhythm:  SR   ICD Device Measurements Right Ventricle:  Amplitude: 9.5 mV, Impedance: 440 ohms, Threshold: 1.0 V at 0.5 msec Shock Impedance: 51 ohms   Episodes MS Episodes:  0     Percent Mode Switch:  0     Coumadin:  No Shock:  0     ATP:  0     Nonsustained:  0     Atrial Therapies:  0 Ventricular Pacing:  <1%  Brady Parameters Mode VVI     Lower Rate Limit:  40      Tachy Zones VF:  240     VT:  200      VT1:  171(MONITOR)     Next Cardiology Appt Due:  11/07/2009 Tech Comments:  NORMAL DEVICE FUNCTION.  CHANGED RV AMPLITUDE TO 2.5 V.  CHANGED SINUS REDETECTION TO FAST(3 INTERVALS) AND UPDATED MORPHOLOGY TEMPLATE.  MERLIN CHECK 11-07-09. Shelly Bombard  Aug 07, 2009 10:27 AM  Impression & Recommendations:  Problem # 1:  AUTOMATIC IMPLANTABLE CARDIAC DEFIBRILLATOR SITU (ICD-V45.02) His device is working normally.  Continue current followup.  Problem # 2:  HYPERTENSION, HX OF (ICD-V12.50) His blood pressure remains well controlled.  Continue current meds.  Problem # 3:  CAD (ICD-414.00) He denies anginal symptoms.  Continue current meds. His updated medication list for this problem includes:    Bayer Low Strength 81 Mg Tbec (Aspirin) .Marland Kitchen... Take 1 tablet two times a day    Aggrenox 25-200 Mg Cp12 (Aspirin-dipyridamole) .Marland Kitchen... Take 1 capsule by mouth two times a day    Metoprolol Tartrate 25 Mg Tabs (Metoprolol tartrate) .Marland Kitchen... 1/2 tab by mouth two times a day  Patient Instructions: 1)  Your physician recommends that you schedule a follow-up appointment in: 12 months with Dr. Lovena Le

## 2010-04-24 NOTE — Letter (Signed)
Summary: Remote Device Check  Yahoo, Kyle  [id number] [place]   [place], [place] [address]   Phone: [phone]  Fax: [phone]     March 18, 2010 MRN: [id number]   [place],   [address]   Dear Mr. Dobek,   Your remote transmission was recieved and reviewed by your physician.  All diagnostics were within normal limits for you.  __X___Your next transmission is scheduled for:   05-29-10.  Please transmit at any time this day.  If you have a wireless device your transmission will be sent automatically.   Sincerely,  Shelly Bombard

## 2010-04-24 NOTE — Cardiovascular Report (Signed)
Summary: Office Visit Remote   Office Visit Remote   Imported By: Sallee Provencal 03/19/2010 11:47:17  _____________________________________________________________________  External Attachment:    Type:   Image     Comment:   External Document

## 2010-04-24 NOTE — Letter (Signed)
Summary: [facility]   Imported By: Bubba Hales 03/06/2010 10:13:39  _____________________________________________________________________  External Attachment:    Type:   Image     Comment:   External Document

## 2010-04-25 NOTE — Letter (Signed)
Summary: [facility]   Imported By: Phillis Knack 04/03/2009 13:48:30  _____________________________________________________________________  External Attachment:    Type:   Image     Comment:   External Document

## 2010-05-14 NOTE — Letter (Signed)
Summary: [facility] Patient Note   [facility] Patient Note   Imported By: Sallee Provencal 05/05/2010 12:08:45  _____________________________________________________________________  External Attachment:    Type:   Image     Comment:   External Document

## 2010-05-16 ENCOUNTER — Telehealth: Payer: Self-pay | Admitting: Pulmonary Disease

## 2010-05-20 NOTE — Progress Notes (Signed)
Summary: handicap stickers  Phone Note Call from Patient Call back at Home Phone [phone]   Caller: Patient Call For: Salayah Meares Summary of Call: pt dropped off envelope re: handicap stickers (he needs 2). he will pick up when called. i have given this to leigh Initial call taken by: Cooper Render, CNA,  May 16, 2010 1:14 PM  Follow-up for Phone Call        forms filled out and placed on SN cart for signature---will call pt once these are signed. Elita Boone CMA  May 16, 2010 1:50 PM    forms have been signed and i called and spoke with pt and he is aware Elita Boone CMA  May 16, 2010 2:20 PM

## 2010-05-29 ENCOUNTER — Encounter (INDEPENDENT_AMBULATORY_CARE_PROVIDER_SITE_OTHER): Payer: Medicare Other

## 2010-05-29 ENCOUNTER — Encounter: Payer: Self-pay | Admitting: Internal Medicine

## 2010-05-29 DIAGNOSIS — I428 Other cardiomyopathies: Secondary | ICD-10-CM

## 2010-06-05 ENCOUNTER — Encounter: Payer: Self-pay | Admitting: *Deleted

## 2010-06-10 NOTE — Cardiovascular Report (Signed)
Summary: Office Visit   Office Visit   Imported By: Sallee Provencal 06/06/2010 14:58:25  _____________________________________________________________________  External Attachment:    Type:   Image     Comment:   External Document

## 2010-06-10 NOTE — Letter (Signed)
Summary: Remote Device Check  [place], [place]  [id number] [place]   [place], [place] [address]   Phone: [phone]  Fax: [phone]     June 05, 2010 MRN: [id number]   [place], [place]  [address]   Dear Mr. Mewborn,   Your remote transmission was recieved and reviewed by your physician.  All diagnostics were within normal limits for you.  _____Your next transmission is scheduled for:                       .  Please transmit at any time this day.  If you have a wireless device your transmission will be sent automatically.  ___X___Your next office visit is scheduled for:   May 2012 with Dr. Lovena Le.                           . Please call our office to schedule an appointment.    Sincerely,  Alma Friendly, LPN

## 2010-08-05 ENCOUNTER — Encounter: Payer: Self-pay | Admitting: Pulmonary Disease

## 2010-08-05 ENCOUNTER — Other Ambulatory Visit (INDEPENDENT_AMBULATORY_CARE_PROVIDER_SITE_OTHER): Payer: Medicare Other

## 2010-08-05 ENCOUNTER — Ambulatory Visit (INDEPENDENT_AMBULATORY_CARE_PROVIDER_SITE_OTHER): Payer: Medicare Other | Admitting: Pulmonary Disease

## 2010-08-05 DIAGNOSIS — E785 Hyperlipidemia, unspecified: Secondary | ICD-10-CM

## 2010-08-05 DIAGNOSIS — F411 Generalized anxiety disorder: Secondary | ICD-10-CM

## 2010-08-05 DIAGNOSIS — I2510 Atherosclerotic heart disease of native coronary artery without angina pectoris: Secondary | ICD-10-CM

## 2010-08-05 DIAGNOSIS — F419 Anxiety disorder, unspecified: Secondary | ICD-10-CM | POA: Insufficient documentation

## 2010-08-05 DIAGNOSIS — I6350 Cerebral infarction due to unspecified occlusion or stenosis of unspecified cerebral artery: Secondary | ICD-10-CM

## 2010-08-05 DIAGNOSIS — I10 Essential (primary) hypertension: Secondary | ICD-10-CM | POA: Insufficient documentation

## 2010-08-05 DIAGNOSIS — R7309 Other abnormal glucose: Secondary | ICD-10-CM

## 2010-08-05 DIAGNOSIS — N259 Disorder resulting from impaired renal tubular function, unspecified: Secondary | ICD-10-CM

## 2010-08-05 DIAGNOSIS — J069 Acute upper respiratory infection, unspecified: Secondary | ICD-10-CM

## 2010-08-05 DIAGNOSIS — D472 Monoclonal gammopathy: Secondary | ICD-10-CM

## 2010-08-05 DIAGNOSIS — J449 Chronic obstructive pulmonary disease, unspecified: Secondary | ICD-10-CM

## 2010-08-05 DIAGNOSIS — R569 Unspecified convulsions: Secondary | ICD-10-CM

## 2010-08-05 LAB — LIPID PANEL
HDL: 42 mg/dL (ref >39.00)
Total CHOL/HDL Ratio: 3
Triglycerides: 192 mg/dL — ABNORMAL HIGH (ref 0.0–149.0)

## 2010-08-05 LAB — TSH: TSH: 1.58 u[IU]/mL (ref 0.35–5.50)

## 2010-08-05 LAB — BASIC METABOLIC PANEL
CO2: 27 mEq/L (ref 19–32)
Calcium: 9.2 mg/dL (ref 8.4–10.5)
Chloride: 104 mEq/L (ref 96–112)
Creatinine, Ser: 1.2 mg/dL (ref 0.4–1.5)
Glucose, Bld: 95 mg/dL (ref 70–99)

## 2010-08-05 LAB — HEMOGLOBIN A1C: Hgb A1c MFr Bld: 6.5 % (ref 4.6–6.5)

## 2010-08-05 MED ORDER — LEVALBUTEROL HCL 0.63 MG/3ML IN NEBU
0.6300 mg | INHALATION_SOLUTION | Freq: Once | RESPIRATORY_TRACT | Status: AC
  Administered 2010-08-05: 0.63 mg via RESPIRATORY_TRACT

## 2010-08-05 MED ORDER — AZITHROMYCIN 250 MG PO TABS: ORAL_TABLET | ORAL | Status: AC

## 2010-08-05 NOTE — Assessment & Plan Note (Signed)
[name] OFFICE NOTE   NAME:Brad [name], Brad [name]                    MRN:          [id number]  DATE:12/01/2006                            DOB:          05-Mar-1946    CLINICAL HISTORY:  The patient is 65 years old and came in today for a  pacemaker check.  He was scheduled to see Dr. Lovena Le next week but he  came in early so we saw him today.  He had a remote myocardial  infarction due to a distal circumflex lesion in 1987.  He was admitted  in May with a seizure and his electrocardiogram showed elevation in V1  and V2 suggestive of Brugada syndrome.  He underwent catheterization and  had nonobstructive coronary artery disease and subsequently underwent an  ICD implantation by Dr. Lovena Le.  He indicated that he had one shock  early after the pacemaker was implanted.  He has had none since then and  has been feeling well.   PAST MEDICAL HISTORY:  1. Chronic renal insufficiency.  2. Anemia.  3. Hyperlipidemia.  4. Chronic obstructive pulmonary disease.  5. GERD.  6. History of previous stroke.   CURRENT MEDICATIONS:  1. Aggrenox.  2. Aspirin.  3. Iron.  4. Metoprolol.  5. Multivitamin.  6. Tricor.   PHYSICAL EXAMINATION:  NECK:  There was no venous distention.  The  carotid pulses were full.  CHEST:  Clear without rales or rhonchi.  CARDIOVASCULAR:  Regular rhythm and there were no murmurs or gallops.  EXTREMITIES:  Peripheral pulses were full.   Interrogation of his defibrillator indicated that he has had no  therapies.  He is programmed at VVI at 94 and he has a good threshold.   IMPRESSION:  1. Seizure and syncope felt related to Brugada's syndrome.  2. Status post implantable cardioverter defibrillator implantation      with good function.  3. History of remote myocardial infarction with nonobstructive disease      at recent catheterization.  4. History of previous TIA stroke.  5.  Hyperlipidemia.  6. Chronic renal insufficiency with a creatinine of 1.7.   RECOMMENDATIONS:  The patient is to see Dr. Lovena Le in follow-up in three  months and has an appointment to see Dr. Stanford Breed who is his regular  cardiologist, in January.     Brad Alfonso Patten Olevia Perches, MD, [facility]  Electronically Signed    BRB/MedQ  DD: 12/01/2006  DT: 12/02/2006  Job #: [id number]

## 2010-08-05 NOTE — Assessment & Plan Note (Signed)
[facility]                         ELECTROPHYSIOLOGY OFFICE NOTE   NAME:Fosse, RUHAAN TAUBMAN                    MRN:          [id number]  DATE:12/17/2006                            DOB:          07/21/1945    Mr. Woolverton returns today for followup.  He is a very pleasant, middle-  aged man with syncope and seizures, as well as history of Brugada  pattern on his ECG, who underwent ICD implantation.  He did receive one  ICD shock, secondary to atrial fibrillation, several months ago.  He has  been stable since then and denies chest pain or shortness of breath  today.  He has remained very active and has no problems with his  appetite and no trouble sleeping.   His medications include Aggrenox, aspirin, Keppra, metoprolol 50 mg a  half tablet twice daily and Tricor.   EKG demonstrates sinus rhythm with a first degree AV block and right  bundle branch block.   IMPRESSION:  1. 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  2. Syncope, thought secondary to #1.  3. Status post ICD insertion.  4. History of seizures.   DISCUSSION:  Overall, Mr. Jespersen is stable.  Today is defibrillator is  working normally.  We will plan to see him back for ICD followup in six  months.     Champ Mungo. Lovena Le, MD  Electronically Signed    GWT/MedQ  DD: 12/17/2006  DT: 12/18/2006  Job #: [id number]

## 2010-08-05 NOTE — Assessment & Plan Note (Signed)
[facility]                             PULMONARY OFFICE NOTE   NAME:Brad [name], Brad [name]                    MRN:          [id number]  DATE:08/26/2006                            DOB:          Apr 26, 1945    HISTORY OF PRESENT ILLNESS:  Patient is a 65 year old African-American  male, a very complicated patient of Dr. Jeannine Kitten with a known history of  arteriosclerotic heart disease, peripheral vascular disease,  hyperlipidemia, who presents today for an acute office visit.  The  patient was recently hospitalized on May 21 through May 28 for a  seizure.  Also, the patient is now status post a single chamber  cardioverter/defibrillator.  Patient does have a post-hospitalization  visit coming up in a couple of days; however, he complains that he has  noticed since his surgery that he has had some tingling along his left  fifth digit.  He complains that he feels it is asleep at times.  He  denies any neck pain, arm pain, loss of use, weakness, or known injury.  The patient has not used any medications for treatment.   PAST MEDICAL HISTORY:  Reviewed.   CURRENT MEDICATIONS:  Reviewed.   PHYSICAL EXAMINATION:  GENERAL:  Patient is a pleasant male in no acute  distress.  VITAL SIGNS:  He is afebrile with stable vital signs.  O2 saturation 99%  on room air.  HEENT:  Unremarkable.  NECK:  Supple without adenopathy.  No JVD.  Cervical range is normal  without any radicular symptoms.  LUNGS:  The lung sounds are diminished.  CARDIAC:  Regular rate.  ABDOMEN:  Soft.  EXTREMITIES:  Warm without any edema.  Left arm range of motion is  normal without any obvious deformity, ecchymosis, or swelling.  Normal  hand grips.  Negative Tinel's sign.  Negative ulnar sign.  Pulses are  intact.  Patient has positive sensation bilaterally and symmetrically.   IMPRESSION/PLAN:  Left fifth digit paresthesias, questionable etiology.  The patient is recommended to take Tylenol.   Alternate ice and heat  around the area and from the wrist down  to the base of the left digit.  Will return here in a few days as  scheduled with Dr. Lenna Gilford, or if symptoms worsen, he is to contact our  office for sooner followup.      Rexene Edison, NP  Electronically Signed      Deborra Medina. Lenna Gilford, MD  Electronically Signed   TP/MedQ  DD: 08/26/2006  DT: 08/27/2006  Job #: [id number]

## 2010-08-05 NOTE — Op Note (Signed)
NAME:  Brad [name], Brad [name] NO.:  [id number]   MEDICAL RECORD NO.:  [id number]          PATIENT TYPE:  INP   LOCATION:  4743                         FACILITY:  [facility]   PHYSICIAN:  Champ Mungo. Lovena Le, MD    DATE OF BIRTH:  [birth year]   DATE OF PROCEDURE:  08/17/2006  DATE OF DISCHARGE:                               OPERATIVE REPORT   PROCEDURE PERFORMED:  Implantation of a single chamber ICD.   INDICATIONS:  Syncope/convulsive seizures in a patient with EKG  consistent with Brugada syndrome.   INTRODUCTION:  The patient is a 65 year old male with a history of  recurrent syncope and seizures who was subsequently found on EKG have  dramatic ST elevation in leads V1 through V2.  A diagnosis of Brugada  syndrome was made.  Because of a history of coronary disease, he  underwent catheterization which demonstrated no obstructive coronary  disease.  He is now referred for ICD implantation secondary to all of  the above.   PROCEDURE:  After informed consent was obtained, the patient was taken  to the diagnostic EP lab in a fasting state.  After preparation and  draping, intravenous fentanyl and midazolam were given for sedation.  30  cc of lidocaine was infiltrated in the left infraclavicular region.  The  left subclavian vein was subsequently punctured after a 7 cm incision  was carried out down to the pectoralis fascia.  The St. Jude Durata  model [id number] 65-cm active fixation defibrillation lead, serial number  [id number] was advanced into the right ventricle and at the final site on  the RV septum the R-waves measured 11 mV and the pacing impedance with  the lead actively fixed was 563 ohms with threshold 0.8 volts at 0.5  milliseconds.  10 volt pacing did not stimulate the diaphragm.  With  this satisfactory parameter, the lead was secured to the subpectoralis  fascia with a figure-of-eight silk suture.  The sew in sleeve was also  secured with silk suture.  Electrocautery was  utilized to make a  subcutaneous pocket.  Kanamycin irrigation was utilized to irrigate the  pocket.  Electrocautery was utilized to assure hemostasis.  The St. Jude  model [phone] current VRRF ICD serial number [id number] was connected to the  defibrillation lead and placed back in the subcutaneous pocket.  Generator was secured with silk suture.  Additional kanamycin was  utilized to irrigate the pocket and at this point defibrillation  threshold testing was carried out.   After the patient was more deeply sedated with fentanyl and Versed, VF  was induced with a T-wave shock.  A 15 joule shock was delivered which  terminated VF and restored sinus rhythm.  At this point a second DFT  test was carried out.  This time, VF was again induced with T-wave  shock.  This time a 10 joule shock was delivered which failed to  terminate VF.  A second 20 joule shock was then delivered terminating VF  and restoring sinus rhythm.  At this point no additional defibrillation  threshold testing was carried out and the incision was closed  with layer  of 2-0 Vicryl followed by 3-0 Vicryl followed by layer of 4-0 Vicryl.  Benzoin was painted on the skin.  Steri-Strips were applied and a  pressure dressing was placed.  The patient was returned to his room in  satisfactory condition.  There were no immediate procedure  complications.   RESULTS:  Successful implantation of a St. Jude single chamber  defibrillator in a patient with Brugada syndrome and history of syncope.      Champ Mungo. Lovena Le, MD  Electronically Signed     GWT/MEDQ  D:  08/17/2006  T:  08/17/2006  Job:  [id number]   cc:   Deboraha Sprang, MD, [facility]  Scott M. Lenna Gilford, MD

## 2010-08-05 NOTE — Cardiovascular Report (Signed)
NAME:  JEFFRIESDayvin, Manchego             ACCOUNT NO.:  [id number]   MEDICAL RECORD NO.:  [id number]          PATIENT TYPE:  INP   LOCATION:  [id number]                         FACILITY:  [facility]   PHYSICIAN:  Bruce R. Olevia Perches, MD, FACCDATE OF BIRTH:  Sep 02, 1945   DATE OF PROCEDURE:  08/17/2006  DATE OF DISCHARGE:                            CARDIAC CATHETERIZATION   HISTORY:  Mr. Boogaard is 65 years old and has a history of remote  myocardial infarction for which underwent catheterization in 1987 and  was found to have a distal circumflex occlusion which was treated  medically.  He also has renal insufficiency with creatinines that were  as high as 3.74, but it improved with hydration.  He was admitted with a  seizure and has a history of previous stroke and seizure disorder.  His  left cardiogram showed ST elevation in V1 and V2 with right bundle  branch block.  This was felt to be consistent with Brugada syndrome.  He  was scheduled for evaluation with coronary angiography only to rule out  the possibility of ischemia.   PROCEDURE IN DETAIL:  The procedure was performed via the right femoral  and arterial sheath and 5-French preformed coronary catheters.  We only  did 3 injections of the left coronary artery and 1 injection of the  right coronary artery to minimize contrast.  Left ventricular pressure  were measured, but no left ventriculogram was performed.  The patient  the tolerated well and let the laboratory in satisfactory condition.   RESULTS:  1. The aortic pressure was 139/76 with a mean of 102, and left neck      pressure was 139/12.  2. The left main coronary artery and left vein were free of disease.  3. Left descending artery gave rise to a large diagonal branches, 2      septal perforators and 2 small diagonal branches.  These vessels      were free of significant disease.  4. The circumflex artery __________ gave rise to a marginal branch and      2 posterolateral branches.   These vessels were free of significant      disease.  5. The right __________ coronary artery was a moderate-sized vessel      and gave rise to right ventricular branch, posterior descending      branch and a posterolateral branch.  These vessels were free of      significant disease.  6. No left ventriculogram was performed.   CONCLUSION:  Normal coronary angiography.   RECOMMENDATIONS:  Reassurance.  Was discussed with Dr. Caryl Comes and Dr.  Lovena Le regarding further management of his seizure and syncope and  Brugada syndrome.      [name], MD, [facility]  Electronically Signed    BRB/MEDQ  D:  08/17/2006  T:  08/17/2006  Job:  [id number]   cc:   Deboraha Sprang, MD, [facility]  Champ Mungo. Lovena Le, MD  Denice Bors. Stanford Breed, MD, [facility]

## 2010-08-05 NOTE — Assessment & Plan Note (Signed)
[facility]                         ELECTROPHYSIOLOGY OFFICE NOTE   NAME:[name] [name] DILLMAN                    MRN:          [id number]  DATE:09/16/2006                            DOB:          May 11, 1945    Mr. [name] [name] returns today for an unscheduled visit as he noted that he  had recently undergone ICD therapy. The patient denies chest pain. He  states that he was in his usual state of health until yesterday when he  had an episode of vomiting about 5:30 in the evening. He did not take  his medications yesterday evening. He went to bed, he awoke at  approximately 5:00 in the morning to experience another episode of  vomiting. Within a minute of this, the patient noted that he had an ICD  shock. He denied any palpitations prior to this and has had no worsening  heart failure symptoms.   PAST MEDICAL HISTORY:  Notable for a history of syncope and EKG  consistent with the Brugada syndrome and he is status post ICD insertion  for this.   PHYSICAL EXAMINATION:  GENERAL:  He is a pleasant, well-appearing,  middle-age man who is in no acute distress.  VITAL SIGNS:  The blood pressure was 134/88, the pulse was 75 and  regular, respirations were 18, the weight was 174 pounds.  NECK:  Revealed no jugular venous distention.  LUNGS:  Clear bilaterally to auscultation. There were no wheezes, rales  or rhonchi present.  CARDIOVASCULAR:  Regular rate and rhythm with normal S1 and S2.  EXTREMITIES:  Demonstrated no edema.   Interrogation of his defibrillator demonstrates a St. Jude Current RF.  The R waves are 9, the impedance 430 ohms, the threshold was not  recorded. The battery voltage 3.2 volts. There was one episode of what  appears to be atrial fibrillation based on its irregularity for which he  was treated with antitachycardic pacing which initiated what appears to  be either atrial flutter or ventricular tachycardia, it is uncertain as  I cannot  tell since he has only ventricular electrogram and the  morphology is similar to his morphology in atrial fibrillation and in  sinus rhythm. This resulted in acceleration of the tachycardia and  defibrillator therapy was subsequently delivered restoring sinus rhythm.   IMPRESSION:  1. Syncope with seizure disorder.  2. EKG consistent with Brugada syndrome.  3. Status post ICD insertion.  4. ICD discharge  demonstrating clear cut episode of atrial      fibrillation as well as possible episode of VT after      antitachycardic pacing therapy.   DISCUSSION:  Today we increased Mr. [name] [name] NID  from 46 to 8. We also  turned him on a VT monitoring zone from 170 to 200. Will plan to see him  back in several months.     Champ Mungo. Lovena Le, MD  Electronically Signed    GWT/MedQ  DD: 09/16/2006  DT: 09/16/2006  Job #: [id number]

## 2010-08-05 NOTE — H&P (Signed)
NAME:  Brad [name], Panek NO.:  [id number]   MEDICAL RECORD NO.:  [id number]          PATIENT TYPE:  INP   LOCATION:  [id number]                         FACILITY:  [facility]   PHYSICIAN:  Deboraha Sprang, MD, FACCDATE OF BIRTH:  [birth year]   DATE OF ADMISSION:  08/11/2006  DATE OF DISCHARGE:                              HISTORY & PHYSICAL   CARDIOLOGIST:  Denice Bors. Stanford Breed, MD, [facility].   PRIMARY PHYSICIAN:  Deborra Medina. Lenna Gilford, M.D.   ELECTROPHYSIOLOGIST:  Seeing him for the first time on Aug 11, 2006,  Deboraha Sprang, MD, [facility].   This patient has an allergy to LIPITOR, which causes a rash.   PRESENTING CIRCUMSTANCE:  I don't remember much about yesterday, May  20.   HISTORY OF PRESENT ILLNESS:  Brad [name] is a 65 year old male.  He has  a history of diaphragmatic myocardial infarction in 1987, with  catheterization results showing distal occlusion of the left circumflex.  This has been treated medically since that time. Echocardiogram, November 21, 2003:  Ejection fraction 50-55% akinesis, of the inferolateral wall.  He had a Myoview study in 1997 that showed ejection fraction 62%, with  no ischemia and no infarction.  He saw Dr. Stanford Breed in January 2008,  with rescheduled Myoview study.  This was performed on May 03, 2006  to evaluate for ischemia.  Ejection fraction at that time was 59%.  No  abnormalities in left ventricular systolic function.  The study showed a  small inferolateral infarction/scar, without ischemia.  The patient also  has a history of a cerebrovascular accident x3, and history of seizure  disorder.  This patient is very disoriented and somnolent on examination  Aug 11, 2006 in the emergency room.  He apparently went to church on the  evening of Monday night, Aug 09, 2006, went to bed at a normal time, but  overslept until 1:30 p.m. on Aug 10, 2006.  He awoke with soreness all  over, especially the right shoulder.  Incidentally, the myalgias and  pain have abated by examination time today.  Apparently, the patient  called a neighbor, and from thence the patient went to [facility] His creatine kinase of study on admission to the emergency  room was 2130, which would indicate a possible tonic-clonic seizure.  However, there is some thought perhaps the patient had a syncopal event  as well.  Maybe upon arising he did fall out.  Electrocardiogram was  obtained in the emergency room at 1644 hours on Aug 10, 2006 that showed  ST elevations in V1 and V2, right bundle branch block.  This is an EKG  consistent with Brugada syndrome.  He also had an electrocardiogram in  December 2007 which also showed ST elevations in V1 and V2, with right  bundle branch block.  The patient was told to report back to Dr. Olin Pia  office on Wednesday morning, Aug 11, 2006, and he had been seen there by  Dr. Caryl Comes and sent directly to the emergency room here at [facility]:  1. Aggrenox b.i.d.  2.  Keppra 500 mg b.i.d.  3. Antara 130 mg daily.  4. Baby aspirin 81 mg daily.   PAST MEDICAL HISTORY:  1. History of diaphragmatic myocardial infarction in 1987.      Catheterization in 1987 showed distal left circumflex 100%      occluded.  Medical management recommended.  2. History of CVA x3.  3. Seizure disorder.  4. Dyslipidemia, with statin intolerance.  5. Renal insufficiency.  Creatinine is 2.8 on presentation Aug 10, 2006 in the emergency room.  This is  secondary to dehydration.  6. EF is preserved on Myoview study inn 1997.  7. COPD.  8. GERD/hiatal hernia.  9. History of right shoulder dislocation.  10.The patient denies history of hypertension, diabetes, or GI bleed.  11.He has no symptoms with activity attributable to congestive heart      failure.   SOCIAL HISTORY:  The patient lives in [place] alone.  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 Does not  smoke, does not  partake of alcoholic beverages.   FAMILY HISTORY:  The patient has seven brothers and three sisters. One  brother died young of a myocardial infarction.   REVIEW OF SYSTEMS:  The patient is not currently having chest pain,  dyspnea on exertion, orthopnea, or paroxysmal nocturnal dyspnea.  He  does not claim lower extremity edema.  The patient is not having fevers,  chills, uncontrolled weight loss or gain in the last 6 months.  He does  have a rash at the anterior chest at the neck.  It is erythematous and  macular. No lesions or ulcerations to the lower extremities.  He is not  complaining of hematuria or nocturia.  He does have heartburn and hiatal  hernia. No degenerative joint disease.   PHYSICAL EXAMINATION:  GENERAL:  Shows a sleepy, confused the patient as  to the events of the last 48 hours.  VITAL SIGNS:  Temperature 98.1, blood pressure 118/78, pulse is 98 and  regular, respirations 20.  He is on room air without acute distress.  HEENT: Poor dentition. Wears two partials.  Eyes: Pupils equal, round,  reactive to light.  Extraocular movements intact.  No lesions in the  oropharynx.  The nares are patent.  NECK: No jugular venous distension.  No carotid bruits auscultated.  HEART: Regular rate and rhythm, without murmur.  Telemetry shows sinus  rhythm, right bundle branch block.  ABDOMEN:  Soft, nondistended.  Bowel sounds are present.  EXTREMITIES: No clubbing, cyanosis, or edema.  Radial pulses 4/4  bilaterally.  Dorsalis pedis pulses easily palpable.  NEUROLOGIC:  Garbled speech, no obvious deficits.  The patient seems  postictal.   IMPRESSION:  1. Admit syncope versus seizure.  2. Electrocardiogram shows Brugada syndrome.      a.     The patient is at risk for sudden cardiac death.  One       sibling with possible sudden cardiac death.  3. History of diaphragmatic myocardial infarction in 1987.     a.     At catheterization, the left circumflex had 100% distal        stenosis.  4. Recent Myoview, ejection fraction 59%, no ischemia  5. History of transient ischemic attacks/cerebrovascular accident x3.  6. Seizure disorder.  7. Dyslipidemia.  8. Chronic obstructive pulmonary disease.  9. Gastroesophageal reflux disease.  10.History of right shoulder dislocation.   PLAN:  Admit to telemetry.  With creatinine of 2.8,  his creatinine will  be followed in serial fashion.  He will also have serial enzymes taken.  The patient will be added Protonix.  He is scheduled for catheterization  Aug 12, 2006, to follow with ICD.  We will recheck the BMET in the  afternoon today and in the morning.  If the creatinine is better, he  will go to catheterization.  In addition nasal have family screening for  Brugada syndrome using electrocardiographic studies.      Sueanne Margarita, [place]      Deboraha Sprang, MD, [facility]  Electronically Signed    GM/MEDQ  D:  08/12/2006  T:  08/12/2006  Job:  [id number]

## 2010-08-05 NOTE — Progress Notes (Signed)
Subjective:    Patient ID: Brad [name], male    DOB: [birth year], 65 y.o.   MRN: [id number]  HPI 65 y/o BM here for a follow up visit... he has mult med problems as noted & he has mult specialty physicians tending to his medical needs... he gets his meds from the [facility]...  ~  Aug 06, 2009:  here for a 27mo follow up visit> the [facility] changed his Antara to SIMVASTATIN 20mg ; and they changed his prev Metformin to Glimepiride, and now to GLYBURIDE 2.5mg ... as usual he did not bring his meds to the OV despite numerous requests to get him to do so for [place] to review w/ him...    He saw [name] for Nephrology 12/10 for f/u of his chr renal insuffic- extensive labs done & Creat=1.2 (it was prev 1.5-1.8), BP controlled, no proteinuria, he knows to avoid NSAIDs...   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   He saw [name] for Cards 5/11 for f/u of his hx prior minor MI in [place] syndrome on EKG ident in 2008 (cath showed normal coronaries) & ICD placed by [name]... he is asymptomatic- no CP, palpit, discharges, dyspnea, edema, etc... no changes made to his meds.  ~  February 03, 2010:  he's had a good 70mo- "same" he says (he is here by himself once again today)... he didn't bring meds (again) & didn't bring list... he continues to f/u w/ DrShadad (10/11- MGUS, no change), [name] (6/11- Creat=1.2, improved), [name] (5/11- Brugada syndrome, norm coronaries on cath, stable)... he states that his BS is "doin pretty good";  he remains on ASA, Keppra, & Aggrenox- no seizures reported... see labs below:  ~  Aug 05, 2010:  36mo ROV & once again he is here alone & didn't bring meds or med list- "same as before" he says;  He continues to get his care by committee & he sees all his doctors every 34mo "I like it like that" he says, w/ Nephrology- [name] doing his labs every 79mo & his note of  1/12 is reviewed> stable CKD stage 2 w/ Creat= 1.12;  BP controlled, cardiac stable, DM controlled on low dose Glyburide & he is asked to get A1c from [facility] on next labs;  He denies any cerebral ischemic symptoms or seizures; he still follows w/ Drshadad for his MGUS (we don't have any recent notes from Heme)...          Problem List:  COPD - exsmoker quit >5 years now "I learned my lesson"... no regular meds... denies recurrent symptoms~ denies cough, sputum, hemoptysis, worsening dyspnea, wheezing, chest pain, snoring, daytime hypersomnolence, etc...  HBP, CAD & BRUGADA Syndrome - followed by Hilary Hertz & his notes are reviewed... 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 Myoview 2/08 showed sm inferolat infarct w/o ischemia & EF=59%... hosp 5/08 w/ seizure &  EKG showing Brugada syndrome- cath w/ normal coronaries, good LVF, and defibrillator implanted... he remains on ASA, & METOPROLOL 50mg - 1/2Bid... ~  5/10: BP= 138/80 & even better at home, he says... no CP, palpit, dizzy, etc... ~  11/10: BP= 128/80 & feeling well- no CP, palpit, dizzy, etc... ~  5/11:  BP= 132/78 & feeling well- no complaints or concerns... ~  11/11:  BP= 120/80 & he is asymptomatic.Marland KitchenMarland Kitchen ~  5/12:  BP= 132/76 & he continues asymptomatic...  HYPERLIPIDEMIA - prev w/ fair control on Antara, but [facility] switched him to SIMVASTATIN 20mg /d. ~  FLP 5/09 showed TChol 121, TG 93, HDL 36, LDL 67 ~  FLP 5/10 on Antara130 showed TChol 143, TG 140, HDL 45, LDL 70 ~  FLP 11/10 on Antara130 showed TChol 143, TG 194, HDL 46, LDL 59... same med, better diet; but [facility] to Simva20. ~  FLP 5/11 on Simva20 showed TChol 122, TG 193, HDL 41, LDL 43 ~  FLP 11/11 on Simva20 showed TChol 137, TG 228, HDL 42, LDL 50... needs better low fat diet. 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 ~  labs 11/09 on diet showed BS= 116, A1c= 7.1.Marland Kitchen. rec> start Metformin 500mg /d. ~  labs 5/10 on Metform500 showed BS= 96, A1c= 6.1.Marland KitchenMarland Kitchen continue same. ~  [facility] changed pt to Gimepiride 2mg /d... ~  labs 11/10 on Glimep2 showed BS= 78, A1c= 5.9.Marland Kitchen. rec> decr Glimep to 1/2 tab in AM; [facility] ch to Glybur2.5mg . ~  labs 5/11 on Glybur2.5- 1/2 tab showed BS= 79, A1c= 6.0 ~  labs 11/11 on Glybur2.5-1/2tab showed BS= 99, A1c= 6.4 ~  Labs 5/12 on Glybur2,5-1/2tab showed BS= 95, A1c=6.5  Hx of RENAL INSUFFICIENCY - baseline Creat = 1.8.Marland Kitchen. he has hx of renal insuffic related to ACE/ ARB therapy & improved to baseline off this Rx... followed  by [facility]- notes & labs reviewed. ~  labs 2/09 by DrShadad w/ Cr=1.6 ~  labs 9/09 by [facility] showed BUN= 20, Creat= 1.65 ~  labs 11/09 here showed BUN= 12, Creat= 1.6 ~  labs 5/10 showed BUN= 14, creat= 1.2 ~  labs 11/10 showed BUN= 14, Creat= 1.1 ~  labs 5/11 showed BUN= 13, Creat= 1.2 ~  Labs 5/12 showed BUN= 12, Creat= 1.2  Hx STROKE & SEIZURE DISORDER - eval by neuro/ DrSethi... he remains on ASA 81mg Bid,  AGGRENOX Bid, & KEPPRA 500mg Bid... he is stable without focal weakness, sensory changes, speech problems, etc...  ~  5/10 he denies memory problems, but in need of additional evaluation, DrSethi's note 12/09 doesn't indicate difficulty in this area. ~  11/10: continues to deny problems... ~  5/11:  states he had a recent seizure and is due for f/u w/ DrSethi soon... I am still concerned about his memory & affect. ~  11/11 & 5/12>  we do not have notes from Neuro to review...  MONOCLONAL GAMMOPATHY - full eval from DrShadad and latest notes reviewed... of interest the pt tells me he has been a regular blood donor and freq gave "double units"... 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 he continues  on observation from DrShadad for his MGUS- IgG kappa paraprotein without end-organ damage... he has some free kappa light chains in the urine as well... ~  10/11:  f/u DrShadad w/ extensive labs reviewed> no evid progression, continues on observation.   Past Surgical History  Procedure Date  . Cardiac catheterization 1987    Showed distal left circumflex 100% occluded   . Cardiac defibrillator placement 08/17/2006    Implantation of a St. Jude single chamber defibrillator    Outpatient Encounter Prescriptions as of 08/05/2010  Medication Sig Dispense  Refill  . aspirin 81 MG tablet Take 81 mg by mouth 2 (two) times daily.        Marland Kitchen dipyridamole-aspirin (AGGRENOX) 25-200 MG per 12 hr capsule Take 1 capsule by mouth 2 (two) times daily.        Marland Kitchen glyBURIDE (DIABETA) 2.5 MG tablet Take 1/2 tab by mouth once daily       . levETIRAcetam (KEPPRA) 500 MG tablet Take 500 mg by mouth every 12 (twelve) hours.        . metoprolol tartrate (LOPRESSOR) 25 MG tablet Take 1/2 tab by mouth twice daily       . Multiple Vitamin (MULTIVITAMIN) tablet Take 1 tablet by mouth daily.        . simvastatin (ZOCOR) 20 MG tablet Take one half tablet by mouth daily at bedtime         Allergies  Allergen Reactions  . Atorvastatin     REACTION: pt states rash from Lipitor    Review of Systems        See HPI - all other systems neg except as noted... The patient denies anorexia, fever, weight loss, weight gain, vision loss, decreased hearing, hoarseness, chest pain, syncope, dyspnea on exertion, peripheral edema, prolonged cough, headaches, hemoptysis, abdominal pain, melena, hematochezia, severe indigestion/heartburn, hematuria, incontinence, muscle weakness, suspicious skin lesions, transient blindness, difficulty walking, depression, unusual weight change, abnormal bleeding, enlarged lymph nodes, and angioedema.     Objective:   Physical Exam    WD, WN, 65 y/o BM in NAD... GENERAL:  Alert & oriented; pleasant  & cooperative... HEENT:  [place]/AT, EOM-wnl, PERRLA, EACs-clear, TMs-wnl, NOSE-clear, THROAT-clear & wnl. NECK:  Supple w/ fairROM; no JVD; normal carotid impulses w/o bruits; no thyromegaly or nodules palpated; no lymphadenopathy. CHEST:  Clear to P & A; without wheezes/ rales/ or rhonchi. HEART:  regular, gr 1/6 SEM without rubs or gallops heard... ABDOMEN:  Soft & nontender; normal bowel sounds; no organomegaly or masses detected. EXT: without deformities or arthritic changes; no varicose veins/ venous insuffic/ or edema. NEURO:  CN's intact; motor testing normal; sensory testing normal; gait normal & balance OK. DERM:  No lesions noted; no rash etc...   Assessment & Plan:   COPD>  He quit smoking >72yrs ago & is currently asymptomatic...  HBP/ CAD/ Brugada syndrome>  follwed by [name] for Cards & stable...  HYPERLIPIDEMIA>  On Simva20 per the [facility] but unfortunately his TGs remain elev & he is instructed on a better low fat diet...  DM>  [facility] Glyburide monotherapy, discussed diet, exercise, BS monitoring at home etc; he denies hypoglycemic episodes & A1c= 6.5.Marland KitchenMarland Kitchen  RENAL>  Creat= 1.2, and he continues to see [name] every 69mo w/ extensive labs done each visit!  Hx Stroke/ Seizure>  On Keppra & Aggrenox- stable w/o cerebral ischemic symptoms...  MGUS>  followed by DrShadad.Marland KitchenMarland Kitchen

## 2010-08-05 NOTE — Assessment & Plan Note (Signed)
[facility] OFFICE NOTE   NAME:Tarter, CAPERS ZIERDEN                    MRN:          [id number]  DATE:09/08/2006                            DOB:          04-Feb-1946    Mr. Holl seen today in the device clinic for followup of his newly  implanted St. Jude current ICD implanted on Aug 17, 2006 for syncope and  Brugada syndrome. Interrogation of his device demonstrates R waves of  11.1 millivolts with an RV impedance of 440 ohms and a threshold of 0.4  at 0.5 msec with a shock impedance of 47 ohms. His battery voltage was  greater than 3.20 volts with a charge time of 10.1 seconds. He was in  normal sinus rhythm today. His device demonstrates no episodes of  arrhythmia since implant. He was programmed upon presentation for a VT1  zone of 181 beats per minute, a VT zone of 206 beats per minute and a VF  zone of 240 per minutes. Per Dr. Olin Pia instructions, the VT1 zone was  turned off, the VT zone was left at 200 beats per minute and the VF zone  at 240 beats per minute. He has programmed VVI-40 and is V pacing less  than 1% of the time. Mr. Rupard has an appointment on September 23 to  return to clinic to see Dr. Caryl Comes.      Chanetta Marshall, RN,BSN  Electronically Signed      Deboraha Sprang, MD, [facility]  Electronically Signed   AS/MedQ  DD: 09/08/2006  DT: 09/08/2006  Job #: [id number]

## 2010-08-05 NOTE — Assessment & Plan Note (Signed)
[place]                            CARDIOLOGY OFFICE NOTE   NAME:[name] [name] NORDMARK                    MRN:          [id number]  DATE:08/18/2007                            DOB:          [birth year]    [name] [name] is a pleasant gentleman who has a history of coronary  disease with a prior small myocardial infarction in 1987.  However, he  was admitted to [facility] in May 2008, after a seizure.  The  patient ruled out for myocardial infarction with serial enzymes, but an  electrocardiogram demonstrated Brugada syndrome.  A cardiac  catheterization on Aug 17, 2006, showed normal coronary arteries.  Note,  his LV function has been normal in the past.  He ultimately had an ICD  placed by Dr. Lovena [name].  Since I last saw him on January 25, 2007, he has  done well.  He denies any dyspnea, chest pain, palpitations or syncope.  His defibrillator has not fired recently.  He does not smoke.   MEDICATIONS:  1. Aggrenox.  2. Aspirin 81 mg p.o. daily.  3. Keppra 500 mg in the morning and 1000 in the evening.  4. Antara 130 mg p.o. daily.  5. Multivitamin.  6. Metoprolol 25 mg p.o. b.i.d.   PHYSICAL EXAMINATION:  VITAL SIGNS:  Today, shows a blood pressure of  113/77.  His pulse is 65.  He weighs 201 pounds.  HEENT:  Normal.  NECK:  Supple.  CHEST:  Clear.  CARDIOVASCULAR:  Reveals a regular rate and rhythm.  ABDOMEN:  Shows no tenderness.  EXTREMITIES:  Show no edema.   Electrocardiogram shows a sinus rhythm at a rate of 59.  There is a  first-degree AV block, but there are no significant ST changes.   DIAGNOSES:  1. History of coronary artery disease - his most recent      catheterization showed normal coronary arteries.  He will continue      his aspirin and his Lopressor.  Dr. Lenna [name] is following his lipids      and liver.  2. History of hyperlipidemia - as per #1.  3. History of strokes.  4. Brugada syndrome - he is status post  implantable cardioverter-      defibrillator, and this is being managed/followed by Dr. Lovena [name].  5. History of tobacco abuse, now resolved.  6. History of his seizure disorder.   We will see him back in 1 year.     [name] Bors Stanford Breed, MD, [facility]  Electronically Signed    BSC/MedQ  DD: 08/18/2007  DT: 08/18/2007  Job #: [id number]

## 2010-08-05 NOTE — Consult Note (Signed)
NAME:  Brad [name], Brad [name] NO.:  [id number]   MEDICAL RECORD NO.:  [id number]          PATIENT TYPE:  INP   LOCATION:  [id number]                         FACILITY:  [facility]   PHYSICIAN:  Donato Heinz, M.D.DATE OF BIRTH:  Aug 15, 1945   DATE OF CONSULTATION:  08/12/2006  DATE OF DISCHARGE:                                 CONSULTATION   REASON FOR CONSULTATION:  Acute on chronic renal failure.   CONSULTING PHYSICIAN:  Deboraha Sprang, MD, [facility]   HISTORY OF PRESENT ILLNESS:  Mr. Cavanaugh is a 65 year old African  American male whose past medical history was significant for coronary  artery disease status post MI, hypertension, stroke, dyslipidemia, as  well as a history of chronic kidney disease stage 3 with a baseline  creatinine of 1.8 from unclear etiology who was admitted to [facility] on Aug 11, 2006 after he had an episode presumed seizure  activity.  He initially presented not knowing what had happened and  sleeping well past his normal time.  He has approximately 5 hours of  loss in his memory.  He woke up feeling sore all over with diffuse  myalgias and was admitted for either syncope versus seizure by  cardiology.  We were asked to see the patient due to a rise in his  creatinine.  His creatinine has been 1.8 on Aug 05, 2006, and his trend  is as follows.  On February 08, 2006, his BUN was 11, creatinine 1.5.  May 07, 2006, BUN 18, creatinine 2.1.  Aug 05, 2006, BUN 15,  creatinine 1.7.  Aug 10, 2006, BUN 19, creatinine 2.8.  Aug 11, 2006,  BUN 26, creatinine 3.75.  On May 22 his BUN was 25 and creatinine 3.52.  Of note, the patient denies any nonsteroidal, COX-2 inhibitor use, ACE  or ARB therapy.  He denies any previous knowledge of kidney disease.  Denies any dysuria, polyuria, hematuria, urgency, frequency, retention,  no foamy, or frothy urine.   ALLERGIES:  LIPITOR, WHICH CAUSES A RASH.   PAST MEDICAL HISTORY:  1. Coronary artery disease  status post MI in 1986.  He had a cardiac      catheterization in 1987 with left circumflex distal, medical      treatment only.  He had a Myoview done in November 2007, which      showed an EF of 62%.  2. CVA x3.  He had a left middle cerebral artery infarction of embolic      etiology with unknown source, back in March 2007.  Had another one      in August 2005.  Multiple prior TIAs.  3. Dyslipidemia.  4. COPD.  5. Gastroesophageal reflux disease.  6. CKD stage 3, baseline creatinine 1.5 to 1.8.  Estimated creatinine      clearance of 50 mL per minute.  7. Brugada syndrome.  8. Hiatal hernia.   MEDICATIONS:  1. Aggrenox 1 b.i.d.  2. Antara 130 mg daily.  3. Keppra 500 mg b.i.d.  4. Aspirin 81 mg daily.  5. Multivitamin one a day.   FAMILY HISTORY:  His  father died at age 35 from an MI, stroke, and also  had kidney failure.  Mother died at age 61 from old age.  He has one  brother who died at a young age from an MI.  Family history is  significant for coronary artery disease and kidney disease.   SOCIAL HISTORY:  He is divorced for the last 25 years.  He lives alone.  He has a 25-pack year tobacco history, but quit about 1 year ago.  He  also quit alcohol about 6 months ago.  He is a retired Psychologist, forensic and  has a son who is 63 in good health.   REVIEW OF SYSTEMS:  GENERALLY:  Denies any anorexia or malaise.  Ophthalmic, no blurry vision, photophobia.  ENT:  No tinnitus,  dysphagia, odynophagia.  CARDIAC:  No chest pain, palpitations,  orthopnea, PND.  PULMONARY:  No shortness of breath, hemoptysis,  productive cough.  GI:  No nausea, vomiting, hematochezia, melena, or bright red blood per  rectum.  GU:  No dysuria, polyuria, hematuria, urgency, frequency,  retention.  RHEUMATOLOGIC:  He has diffuse myalgias, which are improving.  DERMATOLOGIC:  He has a red discoloration and rash around his eyes and  on his neck since admission.  All other systems negative.   PHYSICAL  EXAMINATION:  GENERAL:  He is a well-developed, well-nourished  man in no apparent distress.  VITAL SIGNS:  His temperature is 98.4, pulse 87, blood pressure 142/92,  respiratory rate 20.  HEENT:  Normocephalic, atraumatic.  Pupils equal, round, reactive to  light.  Extraocular muscles are intact.  No icterus.  Oropharynx without  lesions.  NECK:  Supple.  Full range of motion.  No lymphadenopathy or bruits.  LUNGS:  Clear to auscultation bilaterally.  No rales, rhonchi, or  wheezing.  CARDIAC:  Regular rate and rhythm.  No precardium appreciated.  ABDOMINAL:  Normoactive bowel sounds.  Soft, nontender, nondistended.  No guarding, no rebound, or bruits.  EXTREMITIES:  He had 1+ pedal pulses bilaterally.  No cyanosis,  clubbing, or edema.   LABORATORY DATA:  His sodium is 138, potassium 3.4, chloride 108, CO2  22, BUN 25, creatinine 3.52, glucose 117, calcium 8.4.  His total CPK  was 2,399.  White blood cell count 8.5, hemoglobin 11.6, platelets 199.   Renal ultrasound was normal.  Right kidney measuring 10.7 cm, left  kidney 11.4 cm, no hydro.   ASSESSMENT AND PLAN:  1. Acute on chronic renal failure.  This was unclear etiology.  This      may be related to alterations in blood pressure and renal      hemodynamics following a stroke.  Also, the differential would be a      drug reaction given his new rash, myalgias, elevated CPK.  We will      continue to follow his renal function, urine output.  At this time      we will order a urinalysis as well as sodium and creatinine and      eosinophils, further evaluate the etiology of his acute on chronic      renal failure.  We will hold off on full workup unless he has      evidence of proteinuria and/or hematuria on urinalysis and then we      will look for vasculitis as a cause for his acute on chronic renal      failure.  At the time we will continue to avoid nonsteroidal's, ACE  inhibitors, and ARBs and we will consider a renal  artery duplex      scan as an outpatient.  2. Chronic kidney disease, stage 3.  His estimated GFR is 50 mL with a      creatinine of 1.8.  He has had negative urinalysis in the past.      The underlying etiology is unclear with the possibility of      hypertensive nephrosclerosis versus renal vascular disease.  We      will continue to follow.  3. Seizure disorder.  He is on Keppra.  This is now the second seizure      he has had in at least 5 months.  We will consider a neuro      evaluation and alteration of his medications that might include      dilantin.  4. Brugada's syndrome.  The patient with ST elevation in V1 and V2 and      right bundle branch block.  Plan per Dr. Caryl Comes.  5. Coronary artery disease.  He has a negative relative index despite      having an elevated CPK level.  Agree with      canceling the heart catheterization given his elevated creatinine      and we will continue to follow.  6. Anemia.  His hemoglobin was 11.6, we will check iron stools and      guaiac stools.   Thank you for this consultation, we will continue to follow along with  you.           ______________________________  Donato Heinz, M.D.     JC/MEDQ  D:  08/12/2006  T:  08/12/2006  Job:  [id number]

## 2010-08-05 NOTE — Assessment & Plan Note (Signed)
[place] OFFICE NOTE   NAME:Brad [name], UILLIAM KAZMER                    MRN:          [id number]  DATE:09/08/2006                            DOB:          06/25/1945    This is a patient of Dr. Darnell Level Brodie's and Dr. San Jetty.   Mr. Queiroz is a very pleasant 65 year old African-American male  patient who presented to the hospital with syncope versus recurrent  seizure activity.  He had ST elevation of V1 and V2, consistent with  Brugada syndrome with negative troponins.  He underwent cardiac  catheterization by Dr. Eustace Quail which revealed normal coronary  arteries, no LV-gram was done.  He then underwent implantation of a  single chamber cardioverter defibrillator by Dr. Jolyn Nap, actually  by Dr. Cristopher Peru, but this is Dr. Olin Pia patient.  The patient also  has stage III renal failure and had acute on chronic renal failure in  the hospital.  His creatinine went from 3.75 down to 1.76 the day after  heart catheterization.  He does have an appointment to see Dr.  Marval Regal next week.   CURRENT MEDICATIONS:  1. Aggrenox b.i.d.  2. Aspirin 81 mg b.i.d.  3. Antara 130 mg daily.  4. Keppra 500 mg b.i.d.  5. Iron b.i.d.  6. Phenytoin 100 mg t.i.d.  7. Metoprolol 50 mg 1/2 b.i.d.  8. Multivitamin daily.   PHYSICAL EXAMINATION:  GENERAL:  This is a very pleasant 65 year old  African-American male in no acute distress.  VITAL SIGNS:  Blood pressure 134/88, pulse 75, weight 174.  NECK:  Without JVD, sharp bruit or thyroid enlargement.  LUNGS:  Clear anterior, posterior, and lateral.  HEART:  Regular rate and rhythm at 75 beats per minute.  Normal S1 and  S2.  No murmur, rub, bruit, thrill, or heave noted.  Defibrillator site  has healed extremely well.  There is no erythema or swelling.  ABDOMEN:  Soft without organomegaly, mass, lesions, or abnormal  tenderness.  Right groin is without hematoma or  hemorrhage.  EXTREMITIES:  Without cyanosis, clubbing, or edema.  He has good distal  pulses.   IMPRESSION:  1. Status post St. Jude single chamber cardioverter defibrillator on      Aug 17, 2006, for Brugada syndrome and syncope.  2. Seizure activity followed by Dr. Leonie Man.  3. History of coronary artery disease status post remote myocardial      infarction in 1987 with occlusion of the distal circumflex treated      medically at that time.  Catheterization on Aug 17, 2006:  Normal      coronary angiography.  No left ventriculogram done.  4. Ejection fraction 50-55% with akinesis of the inferior basal wall      on transesophageal echocardiogram in August of 2005.  5. Stage III kidney disease with acute on chronic renal failure in the      hospital.  Discharge creatinine 1.76.  6. Anemia of chronic disease.  7. Dyslipidemia.  8. History of transient ischemic attack, cerebrovascular accident with      left middle cerebral  artery infarct in March 2007.  9. Chronic obstructive pulmonary disease.  10.Gastroesophageal reflux and a hiatal hernia.   PLAN:  The patient says he feels great, is doing quite well after his  defibrillator insertion.  He is low on his phenantoin, and I asked that  he contact Dr. Clydene Fake office for refill and see him because of his  recent seizure activity and may need change or adjustment in his anti-  seizure medication.  I will not do blood work today, as he has an  appointment to see Dr. Marval Regal next week and follow up of his chronic  renal disease.  He will see Dr. Olevia Perches back in 2-3 months and Dr. Caryl Comes  in September for follow up of his defibrillator.      Ermalinda Barrios, PA-C  Electronically Signed      Vanna Scotland. Olevia Perches, MD, [facility]  Electronically Signed   ML/MedQ  DD: 09/08/2006  DT: 09/08/2006  Job #: [id number]   cc:   Dr Leonie Man  Donato Heinz, M.D.

## 2010-08-05 NOTE — Discharge Summary (Signed)
NAME:  Brad [name], Brad [name] NO.:  [id number]   MEDICAL RECORD NO.:  [id number]          PATIENT TYPE:  INP   LOCATION:  [id number]                         FACILITY:  [facility]   PHYSICIAN:  Brad Sprang, MD, FACCDATE OF BIRTH:  10/01/1945   DATE OF ADMISSION:  08/11/2006  DATE OF DISCHARGE:  08/18/2006                               DISCHARGE SUMMARY   ALLERGIES:  This patient has allergies to LIPITOR which forms a rash.   This dictation greater than 45 minutes.   FINAL DIAGNOSES:  1. Admitted after seizure through the emergency room.      a.     Zenith creatine kinase 2130.      b.     This is the second seizure in 5 months.      c.     Patient currently on Keppra 500 mg twice daily.      d.     Patient may possibly need a change in antiseizure       medications (follow-up with Dr. Lenna Gilford.)  2. Rule out acute myocardial infarction.      a.     Troponin-I studies 0.02 x3.      b.     Electrocardiogram Brugada syndrome/right bundle branch       block.      c.     Left heart catheterization Aug 17, 2006 normal coronary       anatomy/no left ventriculogram.      d.     Discharging day one status post implant St. Jude Current VR       RF single chamber cardioverter defibrillator.      e.     Transesophageal echocardiogram August 2005 ejection fraction       50-55%, akinesis of inferobasal wall.  3. Acute on chronic renal failure this admission.      a.     Stage III kidney disease.      b.     Zenith creatinine this admission 3.75 on the day of       admission.  Discharge creatinine 1.76 the day after left heart       catheterization.      c.     The patient is not taking nonsteroidal anti-inflammatories,       ACE or ARB.  4. Anemia (of chronic kidney disease?)   SECONDARY DIAGNOSES:  1. History of diaphragmatic myocardial infarction 1987.      a.     Catheterization showing distal occlusion of left circumflex       and medical therapy.      b.     Myoview study 1997,  ejection fraction 62%, no ischemia/no       infarction.  2. History of transient ischemic attacks/cerebrovascular accidents.      a.     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  3. Seizure disorder, presented 5 months ago with seizure causing right      shoulder dislocation.  Patient put on Keppra 500 mg b.i.d.  4. Dyslipidemia.  5. Chronic obstructive pulmonary disease.  6. Gastroesophageal reflux disease/hiatal  hernia.   PROCEDURES:  1. Renal ultrasound done Aug 12, 2006 right kidney is normal measuring      10.7 cm.  Left kidney normal measuring 11.4 cm.  Urinary bladder is      negative.  Impression is normal renal sonogram  2. Aug 17, 2006 left heart catheterization without left      ventriculography secondary to renal insufficiency.  Normal coronary      anatomy.  3. Aug 17, 2006 implant St. Jude Current VR RF single chamber      cardioverter defibrillator by Dr. Cristopher Peru.  The patient has no      postprocedural complications either relating to the defibrillator      implantation or to the left heart catheterization.   BRIEF HISTORY:  [name] is a 65 year old African American  male whose past medical history is significant for myocardial infarction  with demonstrated distal occlusion of the left circumflex treated  medically since 1987.  Follow-up echocardiogram does show akinesis of  the inferobasal wall.  He also has a history of hypertension, stroke, a  left middle cerebral artery infarct as recently as March 2007.  He also  has a history of stage III chronic kidney disease and history of seizure  disorder having presented with a right shoulder dislocation 5 months ago  after having a seizure.  He was put on Keppra 500 mg b.i.d. at that  time.   On the afternoon of Aug 11, 2006 the patient woke up sore all.  He  normally arises to walk his dog at about 4:30 or 5:00 in the morning and  it was at this time when he rose Aug 11, 2006 at about 1:30  p.m.  He was  admitted through the emergency room for seizure versus syncope and was  seen by cardiology on admission.  He was ruled out for myocardial  infarction by troponin-I studies which were negative, but the  electrocardiogram showed ST elevations in V1 and V2 which was consistent  with Brugada syndrome.  The patient was admitted with the initial plan  for left heart catheterization followed by cardioverter defibrillator  implantation.  His creatinine on admission through the emergency room  however was 2.8 and subsequently through the day it rose to 3.79 before  beginning to taper off.  The patient was continually hydrated throughout  this admission.  He was seen in consultation by Dr. Marval Regal of the  [facility].  Pertinent laboratory studies were ordered  and will be recorded at the end of this dictation.  Over four days of  hospitalization, this patient's creatinine drifted down from 3.79 to  1.68.  It was stable at 1.56 to 1.58 for a period of 48 hours.  At that  point it was thought that the patient could finally go for left heart  catheterization.  The patient was treated with Mucomyst and with IV  bicarbonate prior to the procedure.  Study showed normal coronary  anatomy.  No left ventriculogram was undertaken.  The postprocedure  creatinine on day #1 after the catheterization was 1.76.  The patient  was discharging that day.   Following left heart catheterization, the patient underwent implantation  of single chamber cardioverter defibrillator by Dr. Cristopher Peru.  The  device has been interrogated, all values within normal limits.  Chest x-  ray shows that the lead is in appropriate position.  Mobility of the  left arm and incision care have been discussed with the  patient.  He is  asked to keep his incision strictly dry for the next 7 days, to sponge bathe until Tuesday August 24, 2006.  He is discharged on the following  medications.  1. Aggrenox one  cap twice daily.  2. Keppra 500 mg twice daily.  3. Tricor 145 mg daily.  4. Multivitamin daily.  5. Enteric-coated aspirin 81 mg daily.  6. Protonix 40 mg daily.  This is new.  7. Metoprolol 50 mg tablets 1/2 tablet in the morning and 1/2 tablet      in the evening.  This is new.  8. Ferrous sulfate 325 mg twice daily.  9. Tylenol Extra Strength for pain or to use Darvocet N 100 1-2      tablets every 4-6 hours if Tylenol does not relieve the pain.   FOLLOW-UP APPOINTMENTS:  1. Dr. Teressa Lower [facility] office Wednesday September 01, 2006 at 10:45      in the morning.  [place] the ICD      clinic for incision check and also post cath checkup Wednesday September 08, 2006 at 9:00.  Elver[facility] Bevington renal ultrasound      Thursday September 16, 2006 at 10:30 in the morning.  He was asked to      eat nothing after midnight Wednesday September 15, 2006.  He is to drink      two large glass of water the morning before the ultrasound, but no      coffee or tea and to take his usual medications.  4. [facility] approximately four weeks after this      discharge.  They will call him with that appointment.   LABORATORY STUDIES:  On the day of discharge Aug 18, 2006 the serum  electrolytes showed sodium 137, potassium is 4, chloride 103, carbonate  27, BUN is 18, creatinine 1.76, glucose is 94.  The CBC on the day of  discharge shows hemoglobin 10.6, hematocrit 31.6, white cells 5.9,  platelets of 275.  On Aug 14, 2006 his hemoglobin was 9.1.  Hemoccult of  stools have been ordered, but have not been collected at the time of  this discharge.  Protime this admission was 15.1, INR 1.2.  His alkaline  phosphatase this admission was 30, SGOT 43, SGPT is 33.  Troponin-I  studies were 0.02 x4.  Urinalysis was negative.  The patient has the  following relevant kidney assays.  The microalbumin to creatinine urine   ratio was 7.2 with a range between 0 and 30.  Total iron was 17 which is  low.  TIBC 201 also low.  Percent iron saturation 8% and a UIBC was 184.  Urine electrophoresis shows free kappa light chains 45.8, reference  range 0.04 to 1.51; free lambda light chains 0.41, reference range 0.08  to 1.01.  Total protein 6.2.  TSH 1.126.  Random urine sodium was 52.      Sueanne Margarita, [place]      Brad Sprang, MD, [facility]  Electronically Signed    GM/MEDQ  D:  08/18/2006  T:  08/18/2006  Job:  [id number]   cc:   Denice Bors. Stanford Breed, MD, [facility]  Brad Sprang, MD, [facility]  Champ Mungo. Lovena Le, MD  Deborra Medina. Lenna Gilford, MD  Donato Heinz, M.D.

## 2010-08-05 NOTE — Assessment & Plan Note (Signed)
[facility]                         ELECTROPHYSIOLOGY OFFICE NOTE   NAME:Kilcrease, LYRIQ MARCIA                    MRN:          [id number]  DATE:08/09/2007                            DOB:          10/09/1945    Mr. Cumba returns today for followup.  He is a very pleasant middle-  aged male with syncope and EKG status post ICD insertion.  He returns  today for followup.  The patient denies any intercurrent IC therapies.  He does note dyspnea with exertion, particularly when he is climbing up  steep inclines.   CURRENT MEDICATIONS:  Aggrenox and aspirin.  He is also on metoprolol 50  mg 1/2 tablet twice daily.   PHYSICAL EXAM:  He is a pleasant well-appearing middle-aged man in no  acute distress.  Blood pressure was 120/90, the pulse 60 and regular.  The respirations  were 18, and the weight was 202 pounds.  NECK:  No jugular distention.  LUNGS:  Clear bilaterally to auscultation.  No wheezes, rales or rhonchi  present.  CARDIOVASCULAR:  Regular rate and rhythm, normal S1 and S2.  ABDOMINAL:  Soft, nontender.  EXTREMITIES:  No cyanosis, clubbing or edema.  Pulses 2+ and symmetric.   No EKG was done today.  Interrogation of his defibrillator demonstrates  a St. Jude Current single chamber device with R-waves of 10, the  impedance 450, the threshold volt at 0.5.  Battery voltage was 3.2  volts.   IMPRESSION:  1. Syncope.  2. Brugada syndrome.  3. Status post ICD insertion.   DISCUSSION:  Mr. Bennight is stable.  His defibrillator is working  normally.  He has had no intercurrent IC therapies and no syncope.  We  will see him back in the office in 1 year.     Champ Mungo. Lovena Le, MD  Electronically Signed    GWT/MedQ  DD: 08/09/2007  DT: 08/09/2007  Job #: [id number]

## 2010-08-05 NOTE — Letter (Signed)
Aug 18, 2006    Deborra Medina. Lenna Gilford, [place] [id number]   RE:  Brad, [name]  MRN:  [id number]  /  DOB:  1945/12/28   Dear Nicki Reaper,   [name] was seen last week in the office following a syncopal  seizure.  His electrocardiogram was astutely discerned by Kirk Ruths  as being consistent with Brugada syndrome, and he underwent ICD  implantation.   He also had transient renal insufficiency, and all of these things are  outlined in the discharge summary, but I wanted to bring to your  attention the fact that he has iron deficiency anemia.  His hemoglobin  was about 11.  His iron saturation was 8%, and I will defer this workup  to you.   Thank you very much for letting us to share in the care of your patient.    Sincerely,      Deboraha Sprang, MD, [facility]  Electronically Signed    SCK/MedQ  DD: 08/18/2006  DT: 08/18/2006  Job #: [id number]

## 2010-08-05 NOTE — Patient Instructions (Signed)
Today we updated your med list in our EPIC system...    We wrote a new prescription for your bronchitis> ZPAK to take as directed...    You should also start on OTC MUCINEX 600mg  twice daily + plenty of fluids for the congestion...  Today we did your follow up fasting blood work...    Please call the [facility]REE in a few days for your results...    Dial [id number] & when prompted enter your patient number followed by the # symbol...    Your patient number is:  [id number]  Call for any questions...  Let's plan a routine follow up visit in 6 months.Marland KitchenMarland Kitchen

## 2010-08-05 NOTE — Assessment & Plan Note (Signed)
[facility] OFFICE NOTE   NAME:Brad [name], Brad [name]                    MRN:          [id number]  DATE:01/25/2007                            DOB:          Aug 21, 1945    Brad [name] is a gentleman that I saw in January with a history of  coronary disease.  He apparently has had a previous small myocardial  infarction in 1987.  He had evidence of a distal circumflex occlusion at  that time that was treated medically.  When I last saw him we scheduled  him to have a Myoview for risk stratification.  This was performed on  May 03, 2006.  His LV function was normal with estimated ejection  fraction of 59%.  There was a small inferolateral infarct without  ischemia.  He apparently was recently admitted to [facility] in  May.  At that time he had a seizure.  He had troponins that were  negative.  However, an electrocardiogram apparently revealed Brugada  syndrome.  He underwent cardiac catheterization and was found to have  normal coronary arteries.  Note his LV function has been preserved in  the past.  The patient ultimately had a single chamber defibrillator  placed.  Since that time he has had one shock.  Note he denies any chest  pain, dyspnea, palpitations or syncope.   MEDICATIONS:  Medications at present include:   1. Aggrenox.  2. Aspirin 81 mg p.o. b.i.d.  3. Keppra.  4. Iron.  5. Metoprolol 25 mg p.o. b.i.d.  6. Multivitamin.  7. Antara 130 mg p.o. daily.   PHYSICAL EXAMINATION:  VITAL SIGNS:  Blood pressure 141/81, pulse 62.  HEENT:  Normal.  NECK:  Supple.  CHEST:  Clear.  CARDIOVASCULAR:  Regular rhythm.  ABDOMEN:  No tenderness.  EXTREMITIES:  No edema.   An electrocardiogram shows sinus rhythm at a rate of 54.  There is a  prior inferior infarct, but there are no ST changes noted.  There is a  right bundle branch block with slight elevation in B2.   DIAGNOSES:  1. Coronary artery  disease - his recent catheterization revealed no      obstructive coronary disease, and we will continue with medical      therapy including his aspirin, beta blocker, and Antara.  2. History of hyperlipidemia - his most recent lipids were outstanding      on his present medications.  3. History of strokes.  4. Brugada syndrome - his defibrillator is in place at present, and we      will continue with his Lopressor as well.  5. History of seizure disorder.  6. History of tobacco abuse now resolved.   He will continue with risk factor modification.  Will see him back in  six months.     Denice Bors Stanford Breed, MD, [facility]  Electronically Signed    BSC/MedQ  DD: 01/25/2007  DT: 01/26/2007  Job #: [id number]

## 2010-08-07 ENCOUNTER — Other Ambulatory Visit: Payer: Self-pay | Admitting: Pulmonary Disease

## 2010-08-08 ENCOUNTER — Encounter: Payer: Self-pay | Admitting: Pulmonary Disease

## 2010-08-08 ENCOUNTER — Ambulatory Visit (INDEPENDENT_AMBULATORY_CARE_PROVIDER_SITE_OTHER): Payer: Medicare Other | Admitting: Internal Medicine

## 2010-08-08 ENCOUNTER — Encounter: Payer: Self-pay | Admitting: Internal Medicine

## 2010-08-08 DIAGNOSIS — Z95810 Presence of automatic (implantable) cardiac defibrillator: Secondary | ICD-10-CM

## 2010-08-08 DIAGNOSIS — Z8679 Personal history of other diseases of the circulatory system: Secondary | ICD-10-CM

## 2010-08-08 DIAGNOSIS — I2510 Atherosclerotic heart disease of native coronary artery without angina pectoris: Secondary | ICD-10-CM

## 2010-08-08 DIAGNOSIS — I428 Other cardiomyopathies: Secondary | ICD-10-CM

## 2010-08-08 NOTE — Procedures (Signed)
EEG NUMBER:  [id number].   HISTORY:  This is a 65 year old patient who is being evaluated for aphasia  and prior history of stroke.  Patient is undergoing a routine EEG.  No skull  defects are noted.   MEDICATIONS:  Aspirin and Lipitor.   EEG CLASSIFICATION:  Normal awake and drowsy.   DESCRIPTION OF RECORDING AND BACKGROUND RHYTHMS:  This recording consists of  a fairly well modulated medium amplitude alpha rhythm of 9 Hz, is reactive  with eye opening and closure.  As the record progresses, patient is  initially in the waking state.  At times, patient enters the drowsy state  with drop out of the background rhythm activities but no evidence of sleep  spindles are seen.  No evidence of onset of stage II sleep is noted.  Patient, towards the end of the recording, undergoes photic stimulation  resulting in the bilateral and symmetric photic driving response.  Hyperventilation was not performed. At no time during the recording did  there appear to be evidence of spikes, spike wave discharges or evidence of  focal slowing.  EKG monitor shows no evidence of cardiac rhythm  abnormalities with heart rate of 56.   IMPRESSION:  This is a normal EEG recording awake and drowsy state.  No  evidence of ictal or interictal discharges were seen.      Jill Alexanders, M.D.  Electronically Signed     [id number]  D:  06/16/2005 19:26:13  T:  06/18/2005 11:09:28  Job #:  [id number]

## 2010-08-08 NOTE — Patient Instructions (Signed)
Your physician wants you to follow-up in: 12 months with Dr Knox Saliva will receive a reminder letter in the mail two months in advance. If you don't receive a letter, please call our office to schedule the follow-up appointment.  Remote monitoring is used to monitor your Pacemaker of ICD from home. This monitoring reduces the number of office visits required to check your device to one time per year. It allows [place] to keep an eye on the functioning of your device to ensure it is working properly. You are scheduled for a device check from home on 11/06/2010 You may send your transmission at any time that day. If you have a wireless device, the transmission will be sent automatically. After your physician reviews your transmission, you will receive a postcard with your next transmission date.

## 2010-08-08 NOTE — Assessment & Plan Note (Signed)
His device is working normally. We'll recheck in several months.

## 2010-08-08 NOTE — Progress Notes (Signed)
HPI Brad Brown returns today for followup. He is a pleasant 65 year old male with an ischemic cardiomyopathy, class I congestive heart failure, hypertension, and dyslipidemia. He also has diabetes. He denies chest pain, shortness of breath, fevers and chills, and has had no intercurrent ICD shocks. The patient had no specific complaints today. He is recovering from bronchitis. Allergies  Allergen Reactions  . Atorvastatin     REACTION: pt states rash from Lipitor     Current Outpatient Prescriptions  Medication Sig Dispense Refill  . aspirin 81 MG tablet Take 81 mg by mouth 2 (two) times daily.        Marland Kitchen azithromycin (ZITHROMAX) 250 MG tablet Take 2 tablets by mouth on day 1, followed by 1 tablet by mouth daily for 4 days. Take 2 tablets (500 mg) on  Day 1,  followed by 1 tablet (250 mg) once daily on Days 2 through 5.  6 each  1  . dipyridamole-aspirin (AGGRENOX) 25-200 MG per 12 hr capsule Take 1 capsule by mouth 2 (two) times daily.        Marland Kitchen glyBURIDE (DIABETA) 2.5 MG tablet Take 1/2 tab by mouth once daily       . levETIRAcetam (KEPPRA) 500 MG tablet Take 500 mg by mouth every 12 (twelve) hours.        . metoprolol tartrate (LOPRESSOR) 25 MG tablet Take 1/2 tab by mouth twice daily       . Multiple Vitamin (MULTIVITAMIN) tablet Take 1 tablet by mouth daily.        . simvastatin (ZOCOR) 20 MG tablet Take one half tablet by mouth daily at bedtime          Past Medical History  Diagnosis Date  . COPD (chronic obstructive pulmonary disease)   . CAD (coronary artery disease)   . Other specified congenital anomaly of heart   . Automatic implantable cardiac defibrillator in situ   . Hyperlipidemia   . Borderline diabetes mellitus   . GERD (gastroesophageal reflux disease)   . Colonic polyp   . History of renal insufficiency syndrome   . Stroke   . Seizure disorder   . Monoclonal gammopathy     ROS:   All systems reviewed and negative except as noted in the HPI.   Past  Surgical History  Procedure Date  . Cardiac catheterization 1987    Showed distal left circumflex 100% occluded   . Cardiac defibrillator placement 08/17/2006    Implantation of a St. Jude single chamber defibrillator     Family History  Problem Relation Age of Onset  . Heart attack Brother      History   Social History  . Marital Status: Single    Spouse Name: N/A    Number of Children: 1  . Years of Education: N/A   Occupational History  .     Social History Main Topics  . Smoking status: Former Smoker -- 0.5 packs/day for 15 years    Types: Cigarettes    Quit date: 03/23/2004  . Smokeless tobacco: Never Used  . Alcohol Use: No     quit 2-3 years ago  . Drug Use: No  . Sexually Active: Not on file   Other Topics Concern  . Not on file   Social History Narrative   ICD-St. Jude  Remote-Yes     BP 130/84  Pulse 57  Ht 5\' 9"  (1.753 m)  Wt 205 lb 12.8 oz (93.35 kg)  BMI 30.39 kg/m2  Physical  Exam:  Well appearing NAD HEENT: Unremarkable Neck:  No JVD, no thyromegally Lymphatics:  No adenopathy Back:  No CVA tenderness Lungs:  Clear. Well-healed ICD incision. HEART:  Regular rate rhythm, no murmurs, no rubs, no clicks Abd:  Flat, positive bowel sounds, no organomegally, no rebound, no guarding Ext:  2 plus pulses, no edema, no cyanosis, no clubbing Skin:  No rashes no nodules Neuro:  CN II through XII intact, motor grossly intact  DEVICE  Normal device function.  See PaceArt for details.   Assess/Plan:

## 2010-08-08 NOTE — Assessment & Plan Note (Signed)
His blood pressure is well controlled. We'll continue his current medications. Will maintain a low-sodium diet.

## 2010-08-08 NOTE — Assessment & Plan Note (Signed)
He denies anginal symptoms. He'll continue his current medications. I encouraged him to exercise on a regular basis.

## 2010-11-06 ENCOUNTER — Ambulatory Visit (INDEPENDENT_AMBULATORY_CARE_PROVIDER_SITE_OTHER): Payer: Medicare Other | Admitting: Adult Health

## 2010-11-06 ENCOUNTER — Encounter: Payer: Medicare Other | Admitting: *Deleted

## 2010-11-06 ENCOUNTER — Encounter: Payer: Self-pay | Admitting: Adult Health

## 2010-11-06 VITALS — BP 124/70 | HR 68 | Temp 98.3°F | Ht 69.0 in | Wt 207.0 lb

## 2010-11-06 DIAGNOSIS — J449 Chronic obstructive pulmonary disease, unspecified: Secondary | ICD-10-CM

## 2010-11-06 MED ORDER — AZITHROMYCIN 250 MG PO TABS: ORAL_TABLET | ORAL | Status: AC

## 2010-11-06 MED ORDER — ALBUTEROL SULFATE (2.5 MG/3ML) 0.083% IN NEBU
2.5000 mg | INHALATION_SOLUTION | Freq: Once | RESPIRATORY_TRACT | Status: AC
  Administered 2010-11-06: 2.5 mg via RESPIRATORY_TRACT

## 2010-11-06 MED ORDER — HYDROCODONE-HOMATROPINE 5-1.5 MG/5ML PO SYRP: 5.0000 mL | ORAL_SOLUTION | Freq: Four times a day (QID) | ORAL | Status: AC | PRN

## 2010-11-06 NOTE — Progress Notes (Signed)
Subjective:    Patient ID: Brad Brown, male    DOB: Feb 26, 1946, 65 y.o.   MRN: [id number]  HPI  65 y/o BM   ~  Aug 06, 2009:  here for a 53mo follow up visit> the [facility] changed his Antara to SIMVASTATIN 20mg ; and they changed his prev Metformin to Glimepiride, and now to GLYBURIDE 2.5mg ... as usual he did not bring his meds to the OV despite numerous requests to get him to do so for [place] to review w/ him...    He saw [name] for Nephrology 12/10 for f/u of his chr renal insuffic- extensive labs done & Creat=1.2 (it was prev 1.5-1.8), BP controlled, no proteinuria, he knows to avoid NSAIDs...   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   He saw [name] for Cards 5/11 for f/u of his hx prior minor MI in [place] syndrome on EKG ident in 2008 (cath showed normal coronaries) & ICD placed by [name]... he is asymptomatic- no CP, palpit, discharges, dyspnea, edema, etc... no changes made to his meds.  ~  February 03, 2010:  he's had a good 69mo- "same" he says (he is here by himself once again today)... he didn't bring meds (again) & didn't bring list... he continues to f/u w/ DrShadad (10/11- MGUS, no change), [name] (6/11- Creat=1.2, improved), [name] (5/11- Brugada syndrome, norm coronaries on cath, stable)... he states that his BS is "doin pretty good";  he remains on ASA, Keppra, & Aggrenox- no seizures reported... 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  ~11/06/2010 Acute OV  Pt presents for an acute office visit. Complains of dry cough, chest congestion, tightness in chest, DOE x1-2 weeks. OTC not helping.  Drainage in throat . Some wheezing mainly in evening. No chest pain or hemoptysis. No recent travel or abx use.  Cough is keeping him up a night.           Problem List:  COPD - exsmoker quit >5 years now "I learned my lesson"... no regular meds..   HBP, CAD & BRUGADA Syndrome - followed by Hilary Hertz & his notes are reviewed... 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 Myoview 2/08 showed sm inferolat infarct w/o ischemia & EF=59%... hosp 5/08 w/ seizure &  EKG showing Brugada syndrome- cath w/ normal coronaries, good LVF, and defibrillator implanted... he remains on ASA, & METOPROLOL 50mg - 1/2Bid... ~  5/10: BP= 138/80 & even better at home, he says... no CP, palpit, dizzy, etc... ~  11/10: BP= 128/80 & feeling well- no CP, palpit, dizzy, etc... ~  5/11:  BP= 132/78 & feeling well- no  complaints or concerns... ~  11/11:  BP= 120/80 & he is asymptomatic... ~  5/12:  BP= 132/76 & he continues asymptomatic...  HYPERLIPIDEMIA - prev w/ fair control on Antara, but [facility] switched him to SIMVASTATIN 20mg /d. ~  FLP 5/09 showed TChol 121, TG 93, HDL 36, LDL 67 ~  FLP 5/10 on Antara130 showed TChol 143, TG 140, HDL 45, LDL 70 ~  FLP 11/10 on Antara130 showed TChol 143, TG 194, HDL 46, LDL 59... same med, better diet; but [facility] to Simva20. ~  FLP 5/11 on Simva20 showed TChol 122, TG 193, HDL 41, LDL 43 ~  FLP 11/11 on Simva20 showed TChol 137, TG 228, HDL 42, LDL 50... needs better low fat diet. 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 ~  labs 11/09 on diet showed BS= 116, A1c= 7.1.Marland Kitchen. rec> start Metformin 500mg /d. ~  labs 5/10 on Metform500 showed BS= 96, A1c= 6.1.Marland KitchenMarland Kitchen continue same. ~  [facility] changed pt to Gimepiride 2mg /d... ~  labs 11/10 on Glimep2 showed BS= 78, A1c= 5.9.Marland Kitchen. rec> decr Glimep to 1/2 tab in AM; [facility] ch to Glybur2.5mg . ~  labs 5/11 on Glybur2.5- 1/2 tab showed BS= 79, A1c= 6.0 ~  labs 11/11 on Glybur2.5-1/2tab showed BS= 99, A1c= 6.4 ~  Labs 5/12 on Glybur2,5-1/2tab showed BS= 95, A1c=6.5  Hx of RENAL INSUFFICIENCY - baseline Creat = 1.8.Marland Kitchen. he has hx of renal insuffic related to ACE/ ARB therapy & improved to baseline off this Rx... followed  by [facility]- notes & labs reviewed. ~  labs 2/09 by DrShadad w/ Cr=1.6 ~  labs 9/09 by [facility] showed BUN= 20, Creat= 1.65 ~  labs 11/09 here showed BUN= 12, Creat= 1.6 ~  labs 5/10 showed BUN= 14, creat= 1.2 ~  labs 11/10 showed BUN= 14, Creat= 1.1 ~  labs 5/11 showed BUN= 13, Creat= 1.2 ~  Labs 5/12 showed BUN= 12, Creat= 1.2  Hx STROKE & SEIZURE DISORDER - eval by neuro/ DrSethi... he remains on ASA 81mg Bid,  AGGRENOX Bid, & KEPPRA 500mg Bid... he is stable without focal weakness, sensory changes, speech problems, etc...  ~  5/10 he denies memory problems, but in need of additional evaluation, DrSethi's note 12/09 doesn't indicate difficulty in this area. ~  11/10: continues to deny problems... ~  5/11:  states he had a recent seizure and is due for f/u w/ DrSethi soon... I am still concerned about his memory & affect. ~  11/11 & 5/12>  we do not have notes from Neuro to review...  MONOCLONAL GAMMOPATHY - full eval from DrShadad and latest notes reviewed... of interest the pt tells me he has been a regular blood donor and freq gave "double units"... 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 he continues  on observation from DrShadad for his MGUS- IgG kappa paraprotein without end-organ damage... he has some free kappa light chains in the urine as well... ~  10/11:  f/u DrShadad w/ extensive labs reviewed> no evid progression, continues on observation.   Past Surgical History  Procedure Date  . Cardiac catheterization 1987    Showed distal left circumflex 100% occluded   . Cardiac defibrillator placement 08/17/2006    Implantation of a St. Jude single  chamber defibrillator    Outpatient Encounter Prescriptions as of 11/06/2010  Medication Sig Dispense Refill  . aspirin 81 MG tablet Take 81 mg by mouth 2 (two) times daily.        Marland Kitchen dipyridamole-aspirin (AGGRENOX) 25-200 MG per 12 hr capsule Take 1 capsule by mouth 2 (two) times daily.        Marland Kitchen glyBURIDE (DIABETA) 2.5 MG tablet Take 1/2 tab by mouth once daily       . levETIRAcetam (KEPPRA) 500 MG tablet Take 500 mg by mouth every 12 (twelve) hours.        . metoprolol tartrate (LOPRESSOR) 25 MG tablet Take 1/2 tab by mouth twice daily       . Multiple Vitamin (MULTIVITAMIN) tablet Take 1 tablet by mouth daily.        . simvastatin (ZOCOR) 20 MG tablet Take one half tablet by mouth daily at bedtime       . azithromycin (ZITHROMAX Z-PAK) 250 MG tablet Take 2 tablets (500 mg) on  Day 1,  followed by 1 tablet (250 mg) once daily on Days 2 through 5.  6 each  0  . HYDROcodone-homatropine (HYDROMET) 5-1.5 MG/5ML syrup Take 5 mLs by mouth every 6 (six) hours as needed for cough.  240 mL  0    Allergies  Allergen Reactions  . Atorvastatin     REACTION: pt states rash from Lipitor    Review of Systems Constitutional:   No  weight loss, night sweats,  Fevers, chills, fatigue, or  lassitude.  HEENT:   No headaches,  Difficulty swallowing,  Tooth/dental problems, or  Sore throat,                No sneezing, itching, ear ache, nasal congestion, post nasal drip,   CV:  No chest pain,  Orthopnea, PND, swelling in lower extremities, anasarca,  dizziness, palpitations, syncope.   GI  No heartburn, indigestion, abdominal pain, nausea, vomiting, diarrhea, change in bowel habits, loss of appetite, bloody stools.   Resp: No shortness of breath with exertion or at rest.  No excess mucus, no productive cough,  No non-productive cough,  No coughing up of blood.  No change in color of mucus.  No wheezing.  No chest wall deformity  Skin: no rash or lesions.  GU: no dysuria, change in color of urine, no urgency or frequency.  No flank pain, no hematuria   MS:  No joint pain or swelling.  No decreased range of motion.  No back pain.  Psych:  No change in mood or affect. No depression or anxiety.  No memory loss.               Objective:   Physical Exam     WD, WN, 65 y/o BM in NAD... GENERAL:  Alert & oriented; pleasant & cooperative... HEENT:  [place]/AT, EOM-wnl, PERRLA, EACs-clear, TMs-wnl, NOSE-clear, THROAT-clear & wnl. NECK:  Supple w/ fairROM; no JVD; normal carotid impulses w/o bruits; no thyromegaly or nodules palpated; no lymphadenopathy. CHEST:  Coarse BS  without wheezes/ rales/ or rhonchi. HEART:  regular, gr 1/6 SEM without rubs or gallops heard... ABDOMEN:  Soft & nontender; normal bowel sounds; no organomegaly or masses detected. EXT: without deformities or arthritic changes; no varicose veins/ venous insuffic/ or edema. NEURO:  CN's intact; motor testing normal; sensory testing normal; gait normal & balance OK. DERM:  No lesions noted; no rash etc...   Assessment & Plan:

## 2010-11-06 NOTE — Assessment & Plan Note (Signed)
Mild flare with URI   Plan:  Zpack take as directed.  Mucinex DM Twice daily  As needed  Cough/congestion  Saline nasal rinses As needed  Hydromet 1-2 tsp every 4-6 hr As needed  Cough-may make you sleepy.  Please contact office for sooner follow up if symptoms do not improve or worsen or seek emergency care  follow up Dr. Lenna Gilford  As planned in November

## 2010-11-06 NOTE — Patient Instructions (Signed)
Zpack take as directed.  Mucinex DM Twice daily  As needed  Cough/congestion  Saline nasal rinses As needed  Hydromet 1-2 tsp every 4-6 hr As needed  Cough-may make you sleepy.  Please contact office for sooner follow up if symptoms do not improve or worsen or seek emergency care  follow up Dr. Lenna Gilford  As planned in November

## 2010-11-06 NOTE — Progress Notes (Signed)
Addended by: Parke Poisson E on: 11/06/2010 05:58 PM   Modules accepted: Orders

## 2010-11-10 ENCOUNTER — Encounter: Payer: Self-pay | Admitting: *Deleted

## 2010-11-13 ENCOUNTER — Ambulatory Visit (INDEPENDENT_AMBULATORY_CARE_PROVIDER_SITE_OTHER): Payer: Medicare Other | Admitting: *Deleted

## 2010-11-13 ENCOUNTER — Telehealth: Payer: Self-pay | Admitting: Internal Medicine

## 2010-11-13 DIAGNOSIS — I428 Other cardiomyopathies: Secondary | ICD-10-CM

## 2010-11-13 NOTE — Telephone Encounter (Signed)
Patient having difficulty transmitting.  Advised to call [email or web address] @ [phone] for tech support.

## 2010-11-13 NOTE — Telephone Encounter (Signed)
Pt sent transmission but he is not sure what he did wrong and he needs help re sending it

## 2010-11-19 ENCOUNTER — Encounter: Payer: Self-pay | Admitting: Internal Medicine

## 2010-11-19 ENCOUNTER — Other Ambulatory Visit: Payer: Self-pay | Admitting: Internal Medicine

## 2010-11-19 LAB — REMOTE ICD DEVICE
BATTERY VOLTAGE: 2.98 V
BRDY-0002RV: 40 {beats}/min
DEVICE MODEL ICD: 449377
HV IMPEDENCE: 51 Ohm
RV LEAD AMPLITUDE: 9.3 mv
TZAT-0001FASTVT: 1
TZAT-0018FASTVT: NEGATIVE
TZAT-0019FASTVT: 7.5 V
TZON-0003FASTVT: 300 ms
TZON-0005SLOWVT: 6
TZON-0010FASTVT: 80 ms
TZON-0010SLOWVT: 80 ms
TZST-0001FASTVT: 2
TZST-0001FASTVT: 4
TZST-0001FASTVT: 5
TZST-0003FASTVT: 25 J
TZST-0003FASTVT: 36 J

## 2010-12-02 NOTE — Progress Notes (Signed)
ICD checked by remote. 

## 2010-12-03 ENCOUNTER — Encounter: Payer: Self-pay | Admitting: *Deleted

## 2010-12-25 ENCOUNTER — Encounter (HOSPITAL_BASED_OUTPATIENT_CLINIC_OR_DEPARTMENT_OTHER): Payer: Medicare Other | Admitting: Oncology

## 2010-12-25 ENCOUNTER — Other Ambulatory Visit: Payer: Self-pay | Admitting: Oncology

## 2010-12-25 DIAGNOSIS — D472 Monoclonal gammopathy: Secondary | ICD-10-CM

## 2010-12-25 DIAGNOSIS — D649 Anemia, unspecified: Secondary | ICD-10-CM

## 2010-12-25 LAB — CBC WITH DIFFERENTIAL/PLATELET
BASO%: 0.6 % (ref 0.0–2.0)
Basophils Absolute: 0 10*3/uL (ref 0.0–0.1)
HCT: 40 % (ref 38.4–49.9)
HGB: 13.9 g/dL (ref 13.0–17.1)
MONO#: 0.4 10*3/uL (ref 0.1–0.9)
NEUT%: 65.7 % (ref 39.0–75.0)
RDW: 13 % (ref 11.0–14.6)
WBC: 5 10*3/uL (ref 4.0–10.3)
lymph#: 1.3 10*3/uL (ref 0.9–3.3)

## 2010-12-26 LAB — POCT I-STAT, CHEM 8
BUN: 16 mg/dL (ref 6–23)
Chloride: 103 mEq/L (ref 96–112)
Creatinine, Ser: 1.7 mg/dL — ABNORMAL HIGH (ref 0.4–1.5)
Sodium: 141 mEq/L (ref 135–145)
TCO2: 26 mmol/L (ref 0–100)

## 2010-12-26 LAB — POCT URINALYSIS DIP (DEVICE)
Bilirubin Urine: NEGATIVE
Ketones, ur: NEGATIVE mg/dL
Protein, ur: NEGATIVE mg/dL
Specific Gravity, Urine: 1.015 (ref 1.005–1.030)
pH: 5.5 (ref 5.0–8.0)

## 2010-12-29 LAB — SPEP & IFE WITH QIG
Alpha-1-Globulin: 3.6 % (ref 2.9–4.9)
Alpha-2-Globulin: 7.8 % (ref 7.1–11.8)
Beta Globulin: 4.4 % — ABNORMAL LOW (ref 4.7–7.2)
Gamma Globulin: 19.2 % — ABNORMAL HIGH (ref 11.1–18.8)
IgG (Immunoglobin G), Serum: 1540 mg/dL (ref 650–1600)
M-Spike, %: 0.98 g/dL
Total Protein, Serum Electrophoresis: 7.1 g/dL (ref 6.0–8.3)

## 2010-12-29 LAB — COMPREHENSIVE METABOLIC PANEL
Albumin: 4.3 g/dL (ref 3.5–5.2)
Alkaline Phosphatase: 45 U/L (ref 39–117)
BUN: 18 mg/dL (ref 6–23)
Glucose, Bld: 77 mg/dL (ref 70–99)
Total Bilirubin: 0.8 mg/dL (ref 0.3–1.2)

## 2010-12-29 LAB — KAPPA/LAMBDA LIGHT CHAINS
Kappa:Lambda Ratio: 7.42 — ABNORMAL HIGH (ref 0.26–1.65)
Lambda Free Lght Chn: 0.84 mg/dL (ref 0.57–2.63)

## 2011-01-08 ENCOUNTER — Encounter (HOSPITAL_BASED_OUTPATIENT_CLINIC_OR_DEPARTMENT_OTHER): Payer: Medicare Other | Admitting: Oncology

## 2011-01-08 DIAGNOSIS — R569 Unspecified convulsions: Secondary | ICD-10-CM

## 2011-01-08 DIAGNOSIS — D472 Monoclonal gammopathy: Secondary | ICD-10-CM

## 2011-02-03 ENCOUNTER — Encounter: Payer: Self-pay | Admitting: Pulmonary Disease

## 2011-02-03 ENCOUNTER — Other Ambulatory Visit (INDEPENDENT_AMBULATORY_CARE_PROVIDER_SITE_OTHER): Payer: Medicare Other

## 2011-02-03 ENCOUNTER — Ambulatory Visit (INDEPENDENT_AMBULATORY_CARE_PROVIDER_SITE_OTHER): Payer: Medicare Other | Admitting: Pulmonary Disease

## 2011-02-03 DIAGNOSIS — I6350 Cerebral infarction due to unspecified occlusion or stenosis of unspecified cerebral artery: Secondary | ICD-10-CM

## 2011-02-03 DIAGNOSIS — I2510 Atherosclerotic heart disease of native coronary artery without angina pectoris: Secondary | ICD-10-CM

## 2011-02-03 DIAGNOSIS — E785 Hyperlipidemia, unspecified: Secondary | ICD-10-CM

## 2011-02-03 DIAGNOSIS — R7309 Other abnormal glucose: Secondary | ICD-10-CM

## 2011-02-03 DIAGNOSIS — J449 Chronic obstructive pulmonary disease, unspecified: Secondary | ICD-10-CM

## 2011-02-03 DIAGNOSIS — I10 Essential (primary) hypertension: Secondary | ICD-10-CM

## 2011-02-03 DIAGNOSIS — R569 Unspecified convulsions: Secondary | ICD-10-CM

## 2011-02-03 DIAGNOSIS — D472 Monoclonal gammopathy: Secondary | ICD-10-CM

## 2011-02-03 DIAGNOSIS — Q248 Other specified congenital malformations of heart: Secondary | ICD-10-CM

## 2011-02-03 LAB — HEMOGLOBIN A1C: Hgb A1c MFr Bld: 6.3 % (ref 4.6–6.5)

## 2011-02-03 MED ORDER — TRAMADOL HCL 50 MG PO TABS: 50.0000 mg | ORAL_TABLET | Freq: Three times a day (TID) | ORAL | Status: DC | PRN

## 2011-02-03 NOTE — Patient Instructions (Signed)
Today we updated your med list in our EPIC system...    Continue your current medications the same...  We wrote a new prescription for TRAMADOL to take up to three times daily as needed for pain (take it w/ Tylenol to potentiate it's effect)...  Today we did your follow up fasting blood work...    Please call the [facility]REE in a few days for your results...    Dial [id number] & when prompted enter your patient number followed by the # symbol...    Your patient number is:  [id number]  Call for any questions...  Stay as active as possible & work on your diet to lose a few lbs...  Let's plan a similar follow up visit in  69months.Marland KitchenMarland Kitchen

## 2011-02-03 NOTE — Progress Notes (Signed)
Subjective:    Patient ID: Brad Brown, male    DOB: 07-15-1945, 65 y.o.   MRN: [id number]  HPI 65 y/o BM here for a follow up visit... he has mult med problems as noted & he has mult specialty physicians tending to his medical needs... he gets his meds from the [facility]...  ~  Aug 06, 2009:  here for a 45mo follow up visit> the [facility] changed his Antara to SIMVASTATIN 20mg ; and they changed his prev Metformin to Glimepiride, and now to GLYBURIDE 2.5mg ... as usual he did not bring his meds to the OV despite numerous requests to get him to do so for [place] to review w/ him...    He saw [name] for Nephrology 12/10 for f/u of his chr renal insuffic- extensive labs done & Creat=1.2 (it was prev 1.5-1.8), BP controlled, no proteinuria, he knows to avoid NSAIDs...   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   He saw [name] for Cards 5/11 for f/u of his hx prior minor MI in [place] syndrome on EKG ident in 2008 (cath showed normal coronaries) & ICD placed by [name]... he is asymptomatic- no CP, palpit, discharges, dyspnea, edema, etc... no changes made to his meds.  ~  February 03, 2010:  he's had a good 35mo- "same" he says (he is here by himself once again today)... he didn't bring meds (again) & didn't bring list... he continues to f/u w/ DrShadad (10/11- MGUS, no change), [name] (6/11- Creat=1.2, improved), [name] (5/11- Brugada syndrome, norm coronaries on cath, stable)... he states that his BS is "doin pretty good";  he remains on ASA, Keppra, & Aggrenox- no seizures reported... 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  I believe pt has an underlying dementia & he is encouraged to follow up w/ neuro for further eval... He continues to f/u w/ his specialists every 67mo & their notes are reviewed...  See prob list below>>  Problem List:  COPD - exsmoker quit >5 years now "I learned my lesson"... no regular meds... denies recurrent symptoms~ denies cough, sputum, hemoptysis, worsening dyspnea, wheezing, chest pain, snoring, daytime hypersomnolence, etc...  HBP, CAD & BRUGADA Syndrome - followed by Hilary Hertz & his notes are reviewed... 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 Myoview 2/08 showed sm inferolat infarct w/o ischemia & EF=59%... hosp 5/08 w/ seizure &  EKG showing Brugada syndrome- cath w/ normal coronaries, good LVF, and defibrillator implanted... he remains on ASA, & METOPROLOL 50mg - 1/2Bid... ~  5/10: BP= 138/80 & even better at home, he says... no CP, palpit, dizzy, etc... ~  11/10:  BP= 128/80 & feeling well- no CP, palpit, dizzy, etc... ~  5/11:  BP= 132/78 & feeling well- no complaints or concerns... ~  11/11:  BP= 120/80 & he is asymptomatic... ~  5/12:  BP= 132/76 & he continues asymptomatic... ~  11/12:  BP= 118/82 & he remains asymptomatic...  HYPERLIPIDEMIA - prev w/ fair control on Antara, but [facility] switched him to SIMVASTATIN 20mg /d. ~  FLP 5/09 showed TChol 121, TG 93, HDL 36, LDL 67 ~  FLP 5/10 on Antara130 showed TChol 143, TG 140, HDL 45, LDL 70 ~  FLP 11/10 on Antara130 showed TChol 143, TG 194, HDL 46, LDL 59... same med, better diet; but [facility] ch to Simva20. ~  FLP 5/11 on Simva20 showed TChol 122, TG 193, HDL 41, LDL 43 ~  FLP 11/11 on Simva20 showed TChol 137, TG 228, HDL 42, LDL 50... needs better low fat diet. ~  FLP 5/12 on Simva20 showed Tchol 141, TG 192, HDL 42, LDL 61 ~  FLP 11/12 on Simva20 showed TChol 133, TG 189, HDL 45, LDL 50... Continue same & better low fat diet.  DIABETES MELLITUS, BORDERLINE (ICD-790.29) - prev on Metformin but [facility] changed to Glimep2mg , then to GLYBURIDE 2.5mg - taking 1/2 tab Qam. ~  labs 11/09 on diet showed BS= 116, A1c= 7.1.Marland Kitchen. rec> start Metformin 500mg /d. ~  labs 5/10 on Metform500 showed BS= 96, A1c= 6.1.Marland KitchenMarland Kitchen continue same. ~  [facility] changed pt to Gimepiride 2mg /d... ~  labs 11/10 on Glimep2 showed BS= 78, A1c= 5.9.Marland Kitchen. rec> decr Glimep to 1/2 tab in AM; [facility] ch to Glybur2.5mg . ~  labs 5/11 on Glybur2.5- 1/2 tab showed BS= 79, A1c= 6.0 ~  labs 11/11 on Glybur2.5-1/2tab showed BS= 99, A1c= 6.4 ~  Labs 5/12 on Glybur2.5-1/2tab showed BS= 95, A1c=6.5 ~  Labs 11/12 on Glybur2.5-1/2tab showed BS= 83, A1c= 6.3  Hx of RENAL INSUFFICIENCY - baseline Creat = 1.8.Marland Kitchen. he has hx of renal insuffic related to ACE/ ARB therapy & improved to baseline off this Rx... followed  by [facility]- notes & labs reviewed. ~  labs 2/09 by DrShadad w/ Cr=1.6 ~  labs 9/09 by [name] showed BUN= 20, Creat= 1.65 ~  labs 11/09 here showed BUN= 12,  Creat= 1.6 ~  labs 5/10 showed BUN= 14, creat= 1.2 ~  labs 11/10 showed BUN= 14, Creat= 1.1 ~  labs 5/11 showed BUN= 13, Creat= 1.2 ~  Labs 5/12 showed BUN= 12, Creat= 1.2 ~  Labs 11/12 showed BUN= 16, Creat= 1.2  Hx STROKE & SEIZURE DISORDER - eval by neuro/ DrSethi... he remains on ASA 81mg Bid,  AGGRENOX Bid, & KEPPRA 500mg Bid... he is stable without focal weakness, sensory changes, speech problems, etc...  ~  5/10 he denies memory  problems, but in need of additional evaluation, DrSethi's note 12/09 doesn't indicate difficulty in this area. ~  11/10: continues to deny problems... ~  5/11:  states he had a recent seizure and is due for f/u w/ DrSethi soon... I am still concerned about his memory & affect. ~  11/11 & 5/12>  we do not have notes from Neuro to review... 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  MONOCLONAL GAMMOPATHY - full eval from DrShadad and latest notes reviewed... of interest the pt tells me he has been a regular blood donor and freq gave "double units"... 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 he has some free kappa light chains in the urine as well... ~  10/11:  f/u DrShadad w/ extensive labs reviewed> no evid progression, continues on observation. ~  11/12:  Now that DrShadad et al are in the [place] system his notes & labs are avail to review...   Past Surgical History  Procedure Date  . Cardiac catheterization 1987    Showed distal left circumflex 100% occluded   . Cardiac defibrillator placement 08/17/2006    Implantation of a St. Jude single chamber defibrillator    Outpatient Encounter  Prescriptions as of 02/03/2011  Medication Sig Dispense Refill  . aspirin 81 MG tablet Take 81 mg by mouth 2 (two) times daily.        Marland Kitchen dipyridamole-aspirin (AGGRENOX) 25-200 MG per 12 hr capsule Take 1 capsule by mouth 2 (two) times daily.        Marland Kitchen glyBURIDE (DIABETA) 2.5 MG tablet Take 1/2 tab by mouth once daily       . levETIRAcetam (KEPPRA) 500 MG tablet Take 500 mg by mouth every 12 (twelve) hours.        . metoprolol tartrate (LOPRESSOR) 25 MG tablet Take 1/2 tab by mouth twice daily       . Multiple Vitamin (MULTIVITAMIN) tablet Take 1 tablet by mouth daily.        . simvastatin (ZOCOR) 20 MG tablet Take one half tablet by mouth daily at bedtime         Allergies  Allergen Reactions  . Atorvastatin     REACTION: pt states rash from Lipitor    Current Medications, Allergies, Past Medical History, Past Surgical History, Family History, and Social History were reviewed in [employer] record.   Review of Systems        See HPI - all other systems neg except as noted... The patient denies anorexia, fever, weight loss, weight gain, vision loss, decreased hearing, hoarseness, chest pain, syncope, dyspnea on exertion, peripheral edema, prolonged cough, headaches, hemoptysis, abdominal pain, melena, hematochezia, severe indigestion/heartburn, hematuria, incontinence, muscle weakness, suspicious skin lesions, transient blindness, difficulty walking, depression, unusual weight change, abnormal bleeding, enlarged lymph nodes, and angioedema.     Objective:   Physical Exam    WD, WN, 65 y/o BM in NAD... GENERAL:  Alert & oriented; pleasant & cooperative... HEENT:  [place]/AT, EOM-wnl, PERRLA, EACs-clear, TMs-wnl, NOSE-clear, THROAT-clear & wnl. NECK:  Supple w/ fairROM; no JVD; normal carotid impulses  w/o bruits; no thyromegaly or nodules palpated; no lymphadenopathy. CHEST:  Clear to P & A; without wheezes/ rales/ or rhonchi. HEART:  regular, gr 1/6 SEM without rubs  or gallops heard... ABDOMEN:  Soft & nontender; normal bowel sounds; no organomegaly or masses detected. EXT: without deformities or arthritic changes; no varicose veins/ venous insuffic/ or edema. NEURO:  CN's intact; motor testing normal; sensory testing normal; gait normal & balance OK. DERM:  No lesions noted; no rash etc...  RADIOLOGY DATA:  Reviewed in the EPIC EMR & discussed w/ the patient...  LABORATORY DATA:  Reviewed in the EPIC EMR & discussed w/ the patient...   Assessment & Plan:   COPD>  He quit smoking >73yrs ago & is currently asymptomatic...  HBP/ CAD/ Brugada syndrome>  follwed by [name] & [name] for Cards & AICD- stable...  HYPERLIPIDEMIA>  On Simva20 per the [facility] but unfortunately his TGs remain elev & he is instructed on a better low fat diet...  DM>  Again [facility] to Glyburide monotherapy, discussed diet, exercise, BS monitoring at home etc; he denies hypoglycemic episodes & A1c= 6.3.Marland KitchenMarland Kitchen  RENAL>  Creat= 1.2, and he continues to see [name] every 63mo w/ extensive labs done each visit!  Hx Stroke/ Seizure>  On Keppra & Aggrenox- stable w/o cerebral ischemic symptoms...  MGUS>  followed by DrShadad.Marland KitchenMarland Kitchen

## 2011-02-04 LAB — BASIC METABOLIC PANEL
BUN: 16 mg/dL (ref 6–23)
Chloride: 106 mEq/L (ref 96–112)
Creatinine, Ser: 1.2 mg/dL (ref 0.4–1.5)
GFR: 82.58 mL/min (ref >60.00)

## 2011-02-04 LAB — LIPID PANEL
Cholesterol: 133 mg/dL (ref 0–200)
LDL Cholesterol: 50 mg/dL (ref 0–99)
Total CHOL/HDL Ratio: 3
Triglycerides: 189 mg/dL — ABNORMAL HIGH (ref 0.0–149.0)
VLDL: 37.8 mg/dL (ref 0.0–40.0)

## 2011-02-04 LAB — HEPATIC FUNCTION PANEL
ALT: 26 U/L (ref 0–53)
Bilirubin, Direct: 0.1 mg/dL (ref 0.0–0.3)
Total Bilirubin: 1.1 mg/dL (ref 0.3–1.2)

## 2011-02-18 ENCOUNTER — Ambulatory Visit (INDEPENDENT_AMBULATORY_CARE_PROVIDER_SITE_OTHER): Payer: Medicare Other | Admitting: Cardiology

## 2011-02-18 ENCOUNTER — Encounter: Payer: Self-pay | Admitting: Pulmonary Disease

## 2011-02-18 ENCOUNTER — Encounter: Payer: Self-pay | Admitting: Cardiology

## 2011-02-18 VITALS — BP 131/84 | HR 59 | Ht 69.0 in | Wt 201.0 lb

## 2011-02-18 DIAGNOSIS — I2510 Atherosclerotic heart disease of native coronary artery without angina pectoris: Secondary | ICD-10-CM

## 2011-02-18 DIAGNOSIS — I10 Essential (primary) hypertension: Secondary | ICD-10-CM

## 2011-02-18 DIAGNOSIS — E785 Hyperlipidemia, unspecified: Secondary | ICD-10-CM

## 2011-02-18 DIAGNOSIS — Z95810 Presence of automatic (implantable) cardiac defibrillator: Secondary | ICD-10-CM

## 2011-02-18 NOTE — Patient Instructions (Signed)
Your physician recommends that you schedule a follow-up appointment in: AS NEEDED  STOP ASPIRIN

## 2011-02-18 NOTE — Assessment & Plan Note (Signed)
Blood pressure controlled. Continue present medications. 

## 2011-02-18 NOTE — Progress Notes (Signed)
HPI:Brad Brown is a pleasant gentleman who has a history of coronary disease with a prior small myocardial infarction in 1987.  However, he was admitted to [facility] in May 2008, after a seizure.  The patient ruled out for myocardial infarction with serial enzymes, but an electrocardiogram demonstrated Brugada syndrome.  A cardiac catheterization on Aug 17, 2006, showed normal coronary arteries.  Note, his LV function has been normal in the past.  He ultimately had an ICD placed by Dr. Lovena Le. Renal Dopplers performed in June of 2008 were normal. Echocardiogram in August of 2005 showed an ejection fraction of 50-55%, trivial aortic insufficiency and mitral regurgitation. There was mild left atrial enlargement. I last saw him in May of 2011. Since then he has done well symptomatically. He denies any dyspnea, chest pain, palpitations, syncope or ICD discharges.  Current Outpatient Prescriptions  Medication Sig Dispense Refill  . aspirin 81 MG tablet Take 81 mg by mouth 2 (two) times daily.        Marland Kitchen dipyridamole-aspirin (AGGRENOX) 25-200 MG per 12 hr capsule Take 1 capsule by mouth 2 (two) times daily.        Marland Kitchen glyBURIDE (DIABETA) 2.5 MG tablet Take 1/2 tab by mouth once daily       . levETIRAcetam (KEPPRA) 500 MG tablet Take 500 mg by mouth every 12 (twelve) hours.        . metoprolol tartrate (LOPRESSOR) 25 MG tablet Take 1/2 tab by mouth twice daily       . Multiple Vitamin (MULTIVITAMIN) tablet Take 1 tablet by mouth daily.        . simvastatin (ZOCOR) 20 MG tablet Take one half tablet by mouth daily at bedtime       . traMADol (ULTRAM) 50 MG tablet Take 1 tablet (50 mg total) by mouth 3 (three) times daily as needed for pain. Maximum dose= 8 tablets per day  50 tablet  2     Past Medical History  Diagnosis Date  . COPD (chronic obstructive pulmonary disease)   . CAD (coronary artery disease)   . Other specified congenital anomaly of heart   . Automatic implantable cardiac  defibrillator in situ   . Hyperlipidemia   . Borderline diabetes mellitus   . GERD (gastroesophageal reflux disease)   . Colonic polyp   . History of renal insufficiency syndrome   . Stroke   . Seizure disorder   . Monoclonal gammopathy     Past Surgical History  Procedure Date  . Cardiac catheterization 1987    Showed distal left circumflex 100% occluded   . Cardiac defibrillator placement 08/17/2006    Implantation of a St. Jude single chamber defibrillator    History   Social History  . Marital Status: Single    Spouse Name: N/A    Number of Children: 1  . Years of Education: N/A   Occupational History  .     Social History Main Topics  . Smoking status: Former Smoker -- 0.5 packs/day for 15 years    Types: Cigarettes    Quit date: 03/23/2004  . Smokeless tobacco: Never Used  . Alcohol Use: No     quit 2-3 years ago  . Drug Use: No  . Sexually Active: Not on file   Other Topics Concern  . Not on file   Social History Narrative   ICD-St. Jude  Remote-Yes    ROS: no fevers or chills, productive cough, hemoptysis, dysphasia, odynophagia, melena, hematochezia, dysuria, hematuria,  rash, seizure activity, orthopnea, PND, pedal edema, claudication. Remaining systems are negative.  Physical Exam: Well-developed well-nourished in no acute distress.  Skin is warm and dry.  HEENT is normal.  Neck is supple. No thyromegaly.  Chest is clear to auscultation with normal expansion. ICD left chest Cardiovascular exam is regular rate and rhythm.  Abdominal exam nontender or distended. No masses palpated. Extremities show no edema. neuro grossly intact  ECG sinus rhythm at a rate of 59. First degree AV block. RV conduction delay.

## 2011-02-18 NOTE — Assessment & Plan Note (Signed)
Continue statin. Lipids and liver monitored by primary care. 

## 2011-02-18 NOTE — Assessment & Plan Note (Signed)
His last cardiac catheterization showed no coronary disease. He will discontinue his aspirin while on Aggrenox. Continue statin.

## 2011-02-18 NOTE — Assessment & Plan Note (Signed)
followup electrophysiology.

## 2011-02-19 ENCOUNTER — Ambulatory Visit (INDEPENDENT_AMBULATORY_CARE_PROVIDER_SITE_OTHER): Payer: Medicare Other | Admitting: *Deleted

## 2011-02-19 DIAGNOSIS — Z95810 Presence of automatic (implantable) cardiac defibrillator: Secondary | ICD-10-CM

## 2011-02-19 DIAGNOSIS — I428 Other cardiomyopathies: Secondary | ICD-10-CM

## 2011-02-20 ENCOUNTER — Encounter: Payer: Self-pay | Admitting: Internal Medicine

## 2011-02-20 ENCOUNTER — Other Ambulatory Visit: Payer: Self-pay | Admitting: Internal Medicine

## 2011-02-20 LAB — REMOTE ICD DEVICE
BRDY-0002RV: 40 {beats}/min
DEV-0020ICD: NEGATIVE
DEVICE MODEL ICD: 449377
RV LEAD AMPLITUDE: 8.8 mv
TZAT-0001FASTVT: 1
TZAT-0004FASTVT: 8
TZAT-0018FASTVT: NEGATIVE
TZON-0005SLOWVT: 6
TZON-0010FASTVT: 80 ms
TZST-0001FASTVT: 2
TZST-0001FASTVT: 5
TZST-0003FASTVT: 25 J
TZST-0003FASTVT: 36 J
TZST-0003FASTVT: 36 J
VENTRICULAR PACING ICD: 1 pct

## 2011-02-24 NOTE — Progress Notes (Signed)
Remote icd check  

## 2011-02-27 ENCOUNTER — Encounter: Payer: Self-pay | Admitting: *Deleted

## 2011-04-07 ENCOUNTER — Encounter: Payer: Self-pay | Admitting: Pulmonary Disease

## 2011-05-21 ENCOUNTER — Ambulatory Visit (INDEPENDENT_AMBULATORY_CARE_PROVIDER_SITE_OTHER): Payer: Medicare Other | Admitting: *Deleted

## 2011-05-21 DIAGNOSIS — Z95810 Presence of automatic (implantable) cardiac defibrillator: Secondary | ICD-10-CM

## 2011-05-21 DIAGNOSIS — I428 Other cardiomyopathies: Secondary | ICD-10-CM

## 2011-05-22 ENCOUNTER — Encounter: Payer: Self-pay | Admitting: Internal Medicine

## 2011-05-22 LAB — REMOTE ICD DEVICE
BATTERY VOLTAGE: 2.9 V
BRDY-0002RV: 40 {beats}/min
DEVICE MODEL ICD: 449377
TZAT-0001FASTVT: 1
TZAT-0020FASTVT: 1 ms
TZON-0004SLOWVT: 12
TZON-0005FASTVT: 6
TZON-0010SLOWVT: 80 ms
TZST-0001FASTVT: 2
TZST-0001FASTVT: 5
TZST-0003FASTVT: 25 J
TZST-0003FASTVT: 36 J
VENTRICULAR PACING ICD: 1 pct

## 2011-06-03 NOTE — Progress Notes (Signed)
ICD remote 

## 2011-06-17 ENCOUNTER — Encounter: Payer: Self-pay | Admitting: *Deleted

## 2011-07-13 ENCOUNTER — Telehealth: Payer: Self-pay | Admitting: Oncology

## 2011-07-13 NOTE — Telephone Encounter (Signed)
pt came by and scheduled appt for oct2013 that were in [place]

## 2011-08-05 ENCOUNTER — Other Ambulatory Visit (INDEPENDENT_AMBULATORY_CARE_PROVIDER_SITE_OTHER): Payer: Medicare Other

## 2011-08-05 ENCOUNTER — Ambulatory Visit (INDEPENDENT_AMBULATORY_CARE_PROVIDER_SITE_OTHER): Payer: Medicare Other | Admitting: Pulmonary Disease

## 2011-08-05 ENCOUNTER — Encounter: Payer: Self-pay | Admitting: Pulmonary Disease

## 2011-08-05 VITALS — BP 130/80 | HR 61 | Temp 96.8°F | Ht 69.0 in | Wt 204.4 lb

## 2011-08-05 DIAGNOSIS — I498 Other specified cardiac arrhythmias: Secondary | ICD-10-CM

## 2011-08-05 DIAGNOSIS — I6350 Cerebral infarction due to unspecified occlusion or stenosis of unspecified cerebral artery: Secondary | ICD-10-CM

## 2011-08-05 DIAGNOSIS — J449 Chronic obstructive pulmonary disease, unspecified: Secondary | ICD-10-CM

## 2011-08-05 DIAGNOSIS — R569 Unspecified convulsions: Secondary | ICD-10-CM

## 2011-08-05 DIAGNOSIS — R7309 Other abnormal glucose: Secondary | ICD-10-CM

## 2011-08-05 DIAGNOSIS — Z79899 Other long term (current) drug therapy: Secondary | ICD-10-CM

## 2011-08-05 DIAGNOSIS — D472 Monoclonal gammopathy: Secondary | ICD-10-CM | POA: Insufficient documentation

## 2011-08-05 DIAGNOSIS — I10 Essential (primary) hypertension: Secondary | ICD-10-CM

## 2011-08-05 DIAGNOSIS — Q248 Other specified congenital malformations of heart: Secondary | ICD-10-CM

## 2011-08-05 DIAGNOSIS — E785 Hyperlipidemia, unspecified: Secondary | ICD-10-CM

## 2011-08-05 DIAGNOSIS — Z95810 Presence of automatic (implantable) cardiac defibrillator: Secondary | ICD-10-CM

## 2011-08-05 DIAGNOSIS — I219 Acute myocardial infarction, unspecified: Secondary | ICD-10-CM

## 2011-08-05 LAB — LIPID PANEL
Cholesterol: 112 mg/dL (ref 0–200)
HDL: 44.8 mg/dL (ref >39.00)
LDL Cholesterol: 38 mg/dL (ref 0–99)
Total CHOL/HDL Ratio: 3
Triglycerides: 144 mg/dL (ref 0.0–149.0)

## 2011-08-05 LAB — CBC WITH DIFFERENTIAL/PLATELET
Basophils Relative: 0.8 % (ref 0.0–3.0)
Eosinophils Absolute: 0.1 10*3/uL (ref 0.0–0.7)
Eosinophils Relative: 2.2 % (ref 0.0–5.0)
Hemoglobin: 14.3 g/dL (ref 13.0–17.0)
Lymphocytes Relative: 25.7 % (ref 12.0–46.0)
MCHC: 33.9 g/dL (ref 30.0–36.0)
MCV: 91.2 fl (ref 78.0–100.0)
Neutro Abs: 3.3 10*3/uL (ref 1.4–7.7)
Neutrophils Relative %: 64.4 % (ref 43.0–77.0)
RBC: 4.63 Mil/uL (ref 4.22–5.81)
WBC: 5.1 10*3/uL (ref 4.5–10.5)

## 2011-08-05 LAB — HEPATIC FUNCTION PANEL
ALT: 28 U/L (ref 0–53)
Bilirubin, Direct: 0.2 mg/dL (ref 0.0–0.3)
Total Bilirubin: 1.3 mg/dL — ABNORMAL HIGH (ref 0.3–1.2)
Total Protein: 7.4 g/dL (ref 6.0–8.3)

## 2011-08-05 LAB — BASIC METABOLIC PANEL
BUN: 13 mg/dL (ref 6–23)
Calcium: 9 mg/dL (ref 8.4–10.5)
Chloride: 107 mEq/L (ref 96–112)
Creatinine, Ser: 1.3 mg/dL (ref 0.4–1.5)

## 2011-08-05 LAB — HEMOGLOBIN A1C: Hgb A1c MFr Bld: 6.2 % (ref 4.6–6.5)

## 2011-08-05 NOTE — Progress Notes (Signed)
Subjective:    Patient ID: Brad Brown, male    DOB: 07/09/1945, 66 y.o.   MRN: [id number]  HPI 66 y/o BM here for a follow up visit... he has mult med problems as noted & he has mult specialty physicians tending to his medical needs... he gets his meds from the [facility]...  ~  Aug 06, 2009:  here for a 52mo follow up visit> the [facility] changed his Antara to SIMVASTATIN 20mg ; and they changed his prev Metformin to Glimepiride, and now to GLYBURIDE 2.5mg ... as usual he did not bring his meds to the OV despite numerous requests to get him to do so for [place] to review w/ him...    He saw [name] for Nephrology 12/10 for f/u of his chr renal insuffic- extensive labs done & Creat=1.2 (it was prev 1.5-1.8), BP controlled, no proteinuria, he knows to avoid NSAIDs...   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   He saw [name] for Cards 5/11 for f/u of his hx prior minor MI in [place] syndrome on EKG ident in 2008 (cath showed normal coronaries) & ICD placed by [name]... he is asymptomatic- no CP, palpit, discharges, dyspnea, edema, etc... no changes made to his meds.  ~  February 03, 2010:  he's had a good 6mo- "same" he says (he is here by himself once again today)... he didn't bring meds (again) & didn't bring list... he continues to f/u w/ DrShadad (10/11- MGUS, no change), [name] (6/11- Creat=1.2, improved), [name] (5/11- Brugada syndrome, norm coronaries on cath, stable)... he states that his BS is "doin pretty good";  he remains on ASA, Keppra, & Aggrenox- no seizures reported... 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  ~  February 03, 2011:  75mo ROV & he reports doing well overall, but is c/o some left hip pain that he states he noted first after "pressing cans"> exam shows great ROM w/o pain & we will hold off on XRay & try Tramadol Prn;  meds remain the same but he has it in his head that DrSethi told him that he could replace the Aggrenox w/ Plavix when his current supply ran out> we called [facility] for his recent notes & they said last visit was 12/10 & faxed that note (hx part complex seiz w/ generalization- last seiz 12/09 during his sleep, hx left brain stroke) & he was rec to continue his Keppra & Aggrenox at that time...  I believe pt has an underlying dementia & he is encouraged to follow up w/ neuro for further eval... He continues to f/u w/ his specialists every 57mo & their notes are reviewed...  See prob list below>>  ~  Aug 05, 2011:  40mo [place] says he is doing well, good 40mo interval w/o new complaints or concerns;  He gets all his meds filled at the [place] but unfortuately he never remembers to bring the meds or list to his office visits "I was in a hurry- watching [place]"; he thinks the [place] changed his Aggrenox but he can't say to what?  We reviewed his prob list, our med list, xrays & labs> see below>>    He had Cards f/u 11/12 by [name]> HBP, ?CAD, Brugada syndrome, defibrillator- cath 2008 showed normal C's & renal doppler's were wnl; Stable, no changes made... LABS 5/13:  FLP- all parameters at goals on Simva20;  Chems- wnl w/ BS=97 A1c=6.2 on Diabeta;  CBC- wnl;  TSH=2.00...          Problem List:  COPD - exsmoker quit >5 years now "I learned my lesson"... no regular  meds... denies recurrent symptoms~ denies cough, sputum, hemoptysis, worsening dyspnea, wheezing, chest pain, snoring, daytime hypersomnolence, etc...  HBP, CAD & BRUGADA Syndrome - followed by Hilary Hertz & his notes are reviewed... 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 Myoview 2/08 showed sm inferolat infarct w/o ischemia & EF=59%... hosp 5/08 w/ seizure &  EKG showing Brugada syndrome- cath w/ normal coronaries, good LVF, and defibrillator implanted... he remains on ASA, & METOPROLOL 25mg - 1/2Bid... ~  5/10: BP= 138/80 & even better at home, he says... no CP, palpit, dizzy, etc... ~  11/10: BP= 128/80 & feeling well- no CP, palpit, dizzy, etc... ~  5/11:  BP= 132/78 & feeling well- no complaints or concerns... ~  11/11:  BP= 120/80 & he is asymptomatic... ~  5/12:  BP= 132/76 & he continues asymptomatic... ~  11/12:  BP= 118/82 & he remains asymptomatic... ~  5/13:  BP= 130/80 & feeling well w/o CP, palpit, SOB, edema, etc...  HYPERLIPIDEMIA - prev w/ fair control on Antara, but [facility] switched him to SIMVASTATIN 20mg /d. ~  FLP 5/09 showed TChol 121, TG 93, HDL 36, LDL 67 ~  FLP 5/10 on Antara130 showed TChol 143, TG 140, HDL 45, LDL 70 ~  FLP 11/10 on Antara130 showed TChol 143, TG 194, HDL 46, LDL 59... same med, better diet; but [facility] to Simva20. ~  FLP 5/11 on Simva20 showed TChol 122, TG 193, HDL 41, LDL 43 ~  FLP 11/11 on Simva20 showed TChol 137, TG 228, HDL 42, LDL 50... needs better low fat diet. ~  FLP 5/12 on Simva20 showed Tchol 141, TG 192, HDL 42, LDL 61 ~  FLP 11/12 on Simva20 showed TChol 133, TG 189, HDL 45, LDL 50... Continue same & better low fat diet. 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 ~  labs 11/09 on diet showed BS= 116,  A1c= 7.1.Marland Kitchen. rec> start Metformin 500mg /d. ~  labs 5/10 on Metform500 showed BS= 96, A1c= 6.1.Marland KitchenMarland Kitchen continue same. ~  [facility] changed pt to Gimepiride 2mg /d... ~  labs 11/10 on Glimep2 showed BS= 78, A1c= 5.9.Marland Kitchen. rec> decr Glimep to 1/2 tab in AM; [facility] ch to Glybur2.5mg . ~  labs 5/11 on Glybur2.5- 1/2 tab showed BS= 79, A1c= 6.0 ~  labs 11/11 on Glybur2.5-1/2tab showed BS= 99, A1c= 6.4 ~  Labs  5/12 on Glybur2.5-1/2tab showed BS= 95, A1c=6.5 ~  Labs 11/12 on Glybur2.5-1/2tab showed BS= 83, A1c= 6.3 ~  1/13:  He had Eye eval DrTanner- neg, no retinopathy... ~  Labs 5/13 on Glybur2.5-1/2tab showed BS= 97, A1c= 6.2  Hx of RENAL INSUFFICIENCY - baseline Creat = 1.8.Marland Kitchen. he has hx of renal insuffic related to ACE/ ARB therapy & improved to baseline off this Rx... followed  by [facility]- notes & labs reviewed. ~  labs 2/09 by DrShadad w/ Cr=1.6 ~  labs 9/09 by [name] showed BUN= 20, Creat= 1.65 ~  labs 11/09 here showed BUN= 12, Creat= 1.6 ~  labs 5/10 showed BUN= 14, creat= 1.2 ~  labs 11/10 showed BUN= 14, Creat= 1.1 ~  labs 5/11 showed BUN= 13, Creat= 1.2 ~  Labs 5/12 showed BUN= 12, Creat= 1.2 ~  Labs 11/12 showed BUN= 16, Creat= 1.2 ~  Labs 5/13 showed BUN=13, Creat= 1.3   Hx STROKE & SEIZURE DISORDER - eval by neuro/ DrSethi... he remains on ASA 81mg Bid,  AGGRENOX Bid, & KEPPRA 500mg Bid... he is stable without focal weakness, sensory changes, speech problems, etc...  ~  5/10 he denies memory problems, but in need of additional evaluation, DrSethi's note 12/09 doesn't indicate difficulty in this area. ~  11/10: continues to deny problems... ~  5/11:  states he had a recent seizure and is due for f/u w/ DrSethi soon... I am still concerned about his memory & affect. ~  11/11 & 5/12>  we do not have notes from Neuro to review... ~  11/12: we called [facility] for their last note & recieved note from 12/10> hx part complex seiz w/ generalization- last seiz 12/09 during his sleep, hx left brain  stroke & he was rec to continue his Keppra & Aggrenox at that time.  MONOCLONAL GAMMOPATHY - full eval from DrShadad and latest notes reviewed... of interest the pt tells me he has been a regular blood donor and freq gave "double units"... 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 he has some free kappa light chains in the urine as well... ~  10/11:  f/u DrShadad w/ extensive labs reviewed> no evid progression, continues on observation. ~  11/12:  Now that DrShadad et al are in the Epic system his notes & labs are avail to review (yearly f/u each fall).   Past Surgical History  Procedure Date  . Cardiac catheterization 1987    Showed distal left circumflex 100% occluded   . Cardiac defibrillator placement 08/17/2006    Implantation of a St. Jude single chamber defibrillator    Outpatient Encounter Prescriptions as of 08/05/2011  Medication Sig Dispense Refill  . dipyridamole-aspirin (AGGRENOX) 25-200 MG per 12 hr capsule Take 1 capsule by mouth 2 (two) times daily.        Marland Kitchen glyBURIDE (DIABETA) 2.5 MG tablet Take 1/2 tab by mouth once daily       . levETIRAcetam (KEPPRA) 500 MG tablet Take 500 mg by mouth every 12 (twelve) hours.        . metoprolol tartrate (LOPRESSOR) 25 MG tablet Take 1/2 tab by mouth twice daily       . Multiple Vitamin (MULTIVITAMIN) tablet Take 1 tablet by mouth daily.        . simvastatin (ZOCOR) 20 MG tablet  Take one half tablet by mouth daily at bedtime       . traMADol (ULTRAM) 50 MG tablet Take 1 tablet (50 mg total) by mouth 3 (three) times daily as needed for pain. Maximum dose= 8 tablets per day  50 tablet  2    Allergies  Allergen Reactions  . Atorvastatin     REACTION: pt states rash from Lipitor    Current Medications, Allergies, Past Medical History, Past Surgical History,  Family History, and Social History were reviewed in [employer] record.   Review of Systems        See HPI - all other systems neg except as noted... The patient denies anorexia, fever, weight loss, weight gain, vision loss, decreased hearing, hoarseness, chest pain, syncope, dyspnea on exertion, peripheral edema, prolonged cough, headaches, hemoptysis, abdominal pain, melena, hematochezia, severe indigestion/heartburn, hematuria, incontinence, muscle weakness, suspicious skin lesions, transient blindness, difficulty walking, depression, unusual weight change, abnormal bleeding, enlarged lymph nodes, and angioedema.     Objective:   Physical Exam    WD, WN, 66 y/o BM in NAD... GENERAL:  Alert & oriented; pleasant & cooperative... HEENT:  [place]/AT, EOM-wnl, PERRLA, EACs-clear, TMs-wnl, NOSE-clear, THROAT-clear & wnl. NECK:  Supple w/ fairROM; no JVD; normal carotid impulses w/o bruits; no thyromegaly or nodules palpated; no lymphadenopathy. CHEST:  Clear to P & A; without wheezes/ rales/ or rhonchi. HEART:  regular, gr 1/6 SEM without rubs or gallops heard... ABDOMEN:  Soft & nontender; normal bowel sounds; no organomegaly or masses detected. EXT: without deformities or arthritic changes; no varicose veins/ venous insuffic/ or edema. NEURO:  CN's intact; motor testing normal; sensory testing normal; gait normal & balance OK. DERM:  No lesions noted; no rash etc...  RADIOLOGY DATA:  Reviewed in the EPIC EMR & discussed w/ the patient...  LABORATORY DATA:  Reviewed in the EPIC EMR & discussed w/ the patient...   Assessment & Plan:   COPD>  He quit smoking >63yrs ago & is currently asymptomatic...  HBP/ CAD/ Brugada syndrome>  followed by Hilary Hertz & [name] for Cards & AICD- stable...  HYPERLIPIDEMIA>  On Simva20 per the [facility] + low fat diet;  F/u FLP is improved & all parameters are at goals...  DM>  [facility] Glyburide monotherapy, discussed  diet, exercise, BS monitoring at home etc; he denies hypoglycemic episodes & A1c= 6.2...  RENAL>  Creat= 1.3, and he continues to see [name] every 53mo w/ extensive labs done each visit!  Hx Stroke/ Seizure>  On Keppra & Aggrenox- stable w/o cerebral ischemic symptoms...  MGUS>  followed by DrShadad w/ labs each Autumn...   Patient's Medications  New Prescriptions   No medications on file  Previous Medications   DIPYRIDAMOLE-ASPIRIN (AGGRENOX) 25-200 MG PER 12 HR CAPSULE    Take 1 capsule by mouth 2 (two) times daily.     GLYBURIDE (DIABETA) 2.5 MG TABLET    Take 1/2 tab by mouth once daily    LEVETIRACETAM (KEPPRA) 500 MG TABLET    Take 500 mg by mouth every 12 (twelve) hours.     METOPROLOL TARTRATE (LOPRESSOR) 25 MG TABLET    Take 1/2 tab by mouth twice daily    MULTIPLE VITAMIN (MULTIVITAMIN) TABLET    Take 1 tablet by mouth daily.     SIMVASTATIN (ZOCOR) 20 MG TABLET    Take one half tablet by mouth daily at bedtime    TRAMADOL (ULTRAM) 50 MG TABLET    Take  1 tablet (50 mg total) by mouth 3 (three) times daily as needed for pain. Maximum dose= 8 tablets per day  Modified Medications   No medications on file  Discontinued Medications   No medications on file

## 2011-08-05 NOTE — Patient Instructions (Signed)
Today we updated your med list in our EPIC system...    Continue your current medications the same...    Since you get all your meds from the [facility]- please send [place] a note listing all your current meds...    Remember to bring all your med bottles to each office visit w/ all of your doctors...  Today we did your follow up fasting blood work...    We will call you w/ the results when avail...  Stay as active as possible...  Call for any questions...  Let's plan a follow up visit in 6 months.Marland KitchenMarland Kitchen

## 2011-08-07 ENCOUNTER — Encounter: Payer: Self-pay | Admitting: Pulmonary Disease

## 2011-08-27 ENCOUNTER — Ambulatory Visit (INDEPENDENT_AMBULATORY_CARE_PROVIDER_SITE_OTHER): Payer: Medicare Other | Admitting: *Deleted

## 2011-08-27 ENCOUNTER — Encounter: Payer: Self-pay | Admitting: Internal Medicine

## 2011-08-27 DIAGNOSIS — Q248 Other specified congenital malformations of heart: Secondary | ICD-10-CM

## 2011-08-27 DIAGNOSIS — I498 Other specified cardiac arrhythmias: Secondary | ICD-10-CM

## 2011-08-27 LAB — ICD DEVICE OBSERVATION
BRDY-0002RV: 40 {beats}/min
DEV-0020ICD: NEGATIVE
DEVICE MODEL ICD: 449377
RV LEAD AMPLITUDE: 9.7 mv
RV LEAD THRESHOLD: 1 V
TZAT-0001FASTVT: 1
TZAT-0004FASTVT: 8
TZAT-0018FASTVT: NEGATIVE
TZON-0003FASTVT: 300 ms
TZON-0005SLOWVT: 6
TZON-0010FASTVT: 80 ms
TZST-0001FASTVT: 2
TZST-0001FASTVT: 5
TZST-0003FASTVT: 25 J
TZST-0003FASTVT: 36 J
TZST-0003FASTVT: 36 J

## 2011-08-27 NOTE — Progress Notes (Signed)
defib check in clinic  

## 2011-11-27 ENCOUNTER — Ambulatory Visit (INDEPENDENT_AMBULATORY_CARE_PROVIDER_SITE_OTHER): Payer: Medicare Other | Admitting: Internal Medicine

## 2011-11-27 ENCOUNTER — Encounter: Payer: Self-pay | Admitting: Internal Medicine

## 2011-11-27 VITALS — BP 131/80 | HR 64 | Ht 69.0 in | Wt 204.8 lb

## 2011-11-27 DIAGNOSIS — I498 Other specified cardiac arrhythmias: Secondary | ICD-10-CM

## 2011-11-27 DIAGNOSIS — Z95810 Presence of automatic (implantable) cardiac defibrillator: Secondary | ICD-10-CM

## 2011-11-27 DIAGNOSIS — Q248 Other specified congenital malformations of heart: Secondary | ICD-10-CM

## 2011-11-27 DIAGNOSIS — Z8679 Personal history of other diseases of the circulatory system: Secondary | ICD-10-CM

## 2011-11-27 DIAGNOSIS — I2510 Atherosclerotic heart disease of native coronary artery without angina pectoris: Secondary | ICD-10-CM

## 2011-11-27 LAB — ICD DEVICE OBSERVATION
BATTERY VOLTAGE: 2.632 V
BRDY-0002RV: 40 {beats}/min
DEVICE MODEL ICD: 449377
FVT: 0
PACEART VT: 0
TOT-0007: 5
TOT-0010: 28
TZAT-0001FASTVT: 1
TZAT-0004FASTVT: 8
TZAT-0012FASTVT: 200 ms
TZAT-0013FASTVT: 2
TZAT-0020FASTVT: 1 ms
TZON-0003SLOWVT: 350 ms
TZON-0005FASTVT: 6
TZON-0010SLOWVT: 80 ms
TZST-0001FASTVT: 5
TZST-0003FASTVT: 25 J
TZST-0003FASTVT: 36 J
TZST-0003FASTVT: 36 J
VENTRICULAR PACING ICD: 0 pct
VF: 0

## 2011-11-27 NOTE — Assessment & Plan Note (Signed)
His blood pressure is fairly well controlled. I've asked the patient to reduce his sodium intake. He is also instructed to lose weight.

## 2011-11-27 NOTE — Assessment & Plan Note (Signed)
His device is working normally. We'll plan to recheck in several months. 

## 2011-11-27 NOTE — Progress Notes (Signed)
HPI [name] returns today for followup. He is a very pleasant 66 year old man with an ischemic cardiomyopathy, chronic systolic heart failure, diabetes, and hypertension. He is status post ICD implantation. In the interim, he has done well. His only complaint is dietary indiscretion. He has gained over 10 pounds. He admits to eating too much. He states "I am declaring war on myself". Allergies  Allergen Reactions  . Atorvastatin     REACTION: pt states rash from Lipitor     Current Outpatient Prescriptions  Medication Sig Dispense Refill  . Cellulose (AVICEL PH 105 MICRO CELLULOSE) POWD by Does not apply route as needed. Apply to infected area as needed.      . CLOPIDOGREL BISULFATE PO Take 75 mg by mouth daily.      Marland Kitchen glyBURIDE (DIABETA) 2.5 MG tablet Take 1/2 tab by mouth once daily       . levETIRAcetam (KEPPRA) 500 MG tablet Take 500 mg by mouth every 12 (twelve) hours.        . metoprolol tartrate (LOPRESSOR) 25 MG tablet Take 1/2 tab by mouth twice daily       . Multiple Vitamin (MULTIVITAMIN) tablet Take 1 tablet by mouth daily.        . naftifine (NAFTIN) 1 % cream Apply 1 application topically 2 (two) times daily. Apply to infected area between toes 2 times daily      . simvastatin (ZOCOR) 20 MG tablet Take one half tablet by mouth daily at bedtime       . dipyridamole-aspirin (AGGRENOX) 25-200 MG per 12 hr capsule Take 1 capsule by mouth 2 (two) times daily.           Past Medical History  Diagnosis Date  . COPD (chronic obstructive pulmonary disease)   . CAD (coronary artery disease)   . Other specified congenital anomaly of heart   . Automatic implantable cardiac defibrillator in situ   . Hyperlipidemia   . Borderline diabetes mellitus   . GERD (gastroesophageal reflux disease)   . Colonic polyp   . History of renal insufficiency syndrome   . Stroke   . Seizure disorder   . Monoclonal gammopathy     ROS:   All systems reviewed and negative except as noted in  the HPI.   Past Surgical History  Procedure Date  . Cardiac catheterization 1987    Showed distal left circumflex 100% occluded   . Cardiac defibrillator placement 08/17/2006    Implantation of a St. Jude single chamber defibrillator     Family History  Problem Relation Age of Onset  . Heart attack Brother      History   Social History  . Marital Status: Single    Spouse Name: N/A    Number of Children: 1  . Years of Education: N/A   Occupational History  .     Social History Main Topics  . Smoking status: Former Smoker -- 0.5 packs/day for 15 years    Types: Cigarettes    Quit date: 03/23/2004  . Smokeless tobacco: Never Used  . Alcohol Use: No     quit 2-3 years ago  . Drug Use: No  . Sexually Active: Not on file   Other Topics Concern  . Not on file   Social History Narrative   ICD-St. Jude  Remote-Yes     BP 131/80  Pulse 64  Ht 5\' 9"  (1.753 m)  Wt 204 lb 12.8 oz (92.897 kg)  BMI 30.24 kg/m2  Physical Exam:  Well appearing NAD HEENT: Unremarkable Neck:  No JVD, no thyromegally Lungs:  Clear with no wheezes, rales, or rhonchi. HEART:  Regular rate rhythm, no murmurs, no rubs, no clicks Abd:  soft, positive bowel sounds, no organomegally, no rebound, no guarding Ext:  2 plus pulses, no edema, no cyanosis, no clubbing Skin:  No rashes no nodules Neuro:  CN II through XII intact, motor grossly intact  DEVICE  Normal device function.  See PaceArt for details.   Assess/Plan:

## 2011-11-27 NOTE — Assessment & Plan Note (Signed)
He denies anginal symptoms. He will continue his current medical therapy. I've encouraged the patient to increase his physical activity.

## 2011-11-27 NOTE — Patient Instructions (Addendum)
Your physician wants you to follow-up in: 12 months with Dr Knox Saliva will receive a reminder letter in the mail two months in advance. If you don't receive a letter, please call our office to schedule the follow-up appointment.   Remote monitoring is used to monitor your Pacemaker of ICD from home. This monitoring reduces the number of office visits required to check your device to one time per year. It allows [place] to keep an eye on the functioning of your device to ensure it is working properly. You are scheduled for a device check from home on 02/29/12. You may send your transmission at any time that day. If you have a wireless device, the transmission will be sent automatically. After your physician reviews your transmission, you will receive a postcard with your next transmission date.

## 2011-12-18 ENCOUNTER — Ambulatory Visit (INDEPENDENT_AMBULATORY_CARE_PROVIDER_SITE_OTHER): Payer: Medicare Other | Admitting: Adult Health

## 2011-12-18 ENCOUNTER — Encounter: Payer: Self-pay | Admitting: Adult Health

## 2011-12-18 VITALS — BP 146/84 | HR 79 | Temp 97.4°F | Ht 69.0 in | Wt 206.3 lb

## 2011-12-18 DIAGNOSIS — J449 Chronic obstructive pulmonary disease, unspecified: Secondary | ICD-10-CM

## 2011-12-18 MED ORDER — METHYLPREDNISOLONE ACETATE 80 MG/ML IJ SUSP
80.0000 mg | Freq: Once | INTRAMUSCULAR | Status: AC
  Administered 2011-12-18: 80 mg via INTRAMUSCULAR

## 2011-12-18 NOTE — Progress Notes (Signed)
Subjective:    Patient ID: Brad Brown, male    DOB: 05/04/1945, 66 y.o.   MRN: [id number]  HPI 66 y/o BM has mult med problems as noted & he has mult specialty physicians tending to his medical needs... he gets his meds from the [facility]...  ~  Aug 06, 2009:  here for a 29mo follow up visit> the [facility] changed his Antara to SIMVASTATIN 20mg ; and they changed his prev Metformin to Glimepiride, and now to GLYBURIDE 2.5mg ... as usual he did not bring his meds to the OV despite numerous requests to get him to do so for [place] to review w/ him...    He saw [name] for Nephrology 12/10 for f/u of his chr renal insuffic- extensive labs done & Creat=1.2 (it was prev 1.5-1.8), BP controlled, no proteinuria, he knows to avoid NSAIDs...   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   He saw [name] for Cards 5/11 for f/u of his hx prior minor MI in [place] syndrome on EKG ident in 2008 (cath showed normal coronaries) & ICD placed by [name]... he is asymptomatic- no CP, palpit, discharges, dyspnea, edema, etc... no changes made to his meds.  ~  February 03, 2010:  he's had a good 44mo- "same" he says (he is here by himself once again today)... he didn't bring meds (again) & didn't bring list... he continues to f/u w/ DrShadad (10/11- MGUS, no change), [name] (6/11- Creat=1.2, improved), [name] (5/11- Brugada syndrome, norm coronaries on cath, stable)... he states that his BS is "doin pretty good";  he remains on ASA, Keppra, & Aggrenox- no seizures reported... see labs below:  ~  Aug 05, 2010:  39mo ROV & once again he is here alone & didn't bring meds or med list- "same as before" he says;  He continues to get his care by committee & he sees all his doctors every 71mo "I like it like that" he says, w/ Nephrology- [name] doing his labs every 66mo & his note of 1/12 is reviewed> stable CKD stage 2  w/ Creat= 1.12;  BP controlled, cardiac stable, DM controlled on low dose Glyburide & he is asked to get A1c from [facility] on next labs;  He denies any cerebral ischemic symptoms or seizures; he still follows w/ DrShadad for his MGUS (we don't have any recent notes from Heme)...  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  I believe pt has an underlying dementia & he is encouraged to follow up w/ neuro for further eval... He continues to f/u w/ his specialists every 84mo & their notes are reviewed...  See prob list below>>  ~  Aug 05, 2011:  95mo ROV & Brad Brown says he  is doing well, good 67mo interval w/o new complaints or concerns;  He gets all his meds filled at the [place] but unfortuately he never remembers to bring the meds or list to his office visits "I was in a hurry- watching [place]"; he thinks the [place] changed his Aggrenox but he can't say to what?  We reviewed his prob list, our med list, xrays & labs> see below>>    He had Cards f/u 11/12 by [name]> HBP, ?CAD, Brugada syndrome, defibrillator- cath 2008 showed normal C's & renal doppler's were wnl; Stable, no changes made... LABS 5/13:  FLP- all parameters at goals on Simva20;  Chems- wnl w/ BS=97 A1c=6.2 on Diabeta;  CBC- wnl;  TSH=2.00...  12/18/2011 Acute OV  Complains of head congestion, runny nose, PND, hoarseness, low grade temp x 2-3days.  No fever or hemoptysis .    No chest pain.  OTC not helping Wheezing started this am.          Problem List:  COPD - exsmoker quit >5 years now "I learned my lesson"... no regular meds... denies recurrent symptoms~ denies cough, sputum, hemoptysis, worsening dyspnea, wheezing, chest pain, snoring, daytime hypersomnolence, etc...  HBP, CAD & BRUGADA Syndrome - followed by Hilary Hertz & his notes are reviewed... 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 Myoview 2/08 showed sm inferolat infarct w/o ischemia & EF=59%... hosp 5/08 w/ seizure &  EKG showing Brugada syndrome- cath w/ normal coronaries, good LVF, and defibrillator implanted... he remains on ASA, & METOPROLOL 25mg - 1/2Bid... ~  5/10: BP= 138/80 & even better at home, he says... no CP, palpit, dizzy, etc... ~  11/10: BP= 128/80 & feeling well- no CP, palpit, dizzy, etc... ~  5/11:  BP= 132/78 & feeling well- no complaints or concerns... ~  11/11:  BP= 120/80 & he is asymptomatic... ~  5/12:  BP= 132/76 & he continues asymptomatic... ~  11/12:  BP= 118/82 & he remains asymptomatic... ~  5/13:  BP= 130/80 & feeling well w/o CP, palpit, SOB, edema, etc...  HYPERLIPIDEMIA - prev w/ fair control on Antara, but [facility] switched him to SIMVASTATIN 20mg /d. ~  FLP 5/09 showed TChol 121, TG 93, HDL 36, LDL 67 ~  FLP 5/10 on Antara130 showed TChol 143, TG 140, HDL 45, LDL 70 ~  FLP 11/10 on Antara130 showed TChol 143, TG 194, HDL 46, LDL 59... same med, better diet; but [facility] to Simva20. ~  FLP 5/11 on Simva20 showed TChol 122, TG 193, HDL 41, LDL 43 ~  FLP 11/11 on Simva20 showed TChol 137, TG 228, HDL 42, LDL 50... needs better low fat diet. ~  FLP 5/12 on Simva20 showed Tchol 141, TG 192, HDL 42, LDL 61 ~  FLP 11/12 on Simva20 showed TChol 133, TG 189, HDL 45, LDL 50... Continue same & better low fat diet. ~  FLP 5/13 on Simva20 showed TChol 112, TG 144, HDL 45, LDL 38  DIABETES  MELLITUS, BORDERLINE (ICD-790.29) - prev on Metformin but [facility] changed to Glimep2mg , then to GLYBURIDE 2.5mg - taking 1/2 tab Qam. ~  labs 11/09 on diet showed BS= 116, A1c= 7.1.Marland Kitchen. rec> start Metformin 500mg /d. ~  labs 5/10 on Metform500 showed BS= 96, A1c= 6.1.Marland KitchenMarland Kitchen continue same. ~  [facility] changed pt to Gimepiride 2mg /d... ~  labs 11/10 on Glimep2 showed BS= 78, A1c= 5.9.Marland Kitchen. rec> decr Glimep to 1/2 tab in AM;  [facility] ch to Glybur2.5mg . ~  labs 5/11 on Glybur2.5- 1/2 tab showed BS= 79, A1c= 6.0 ~  labs 11/11 on Glybur2.5-1/2tab showed BS= 99, A1c= 6.4 ~  Labs 5/12 on Glybur2.5-1/2tab showed BS= 95, A1c=6.5 ~  Labs 11/12 on Glybur2.5-1/2tab showed BS= 83, A1c= 6.3 ~  1/13:  He had Eye eval DrTanner- neg, no retinopathy... ~  Labs 5/13 on Glybur2.5-1/2tab showed BS= 97, A1c= 6.2  Hx of RENAL INSUFFICIENCY - baseline Creat = 1.8.Marland Kitchen. he has hx of renal insuffic related to ACE/ ARB therapy & improved to baseline off this Rx... followed  by [facility]- notes & labs reviewed. ~  labs 2/09 by DrShadad w/ Cr=1.6 ~  labs 9/09 by [name] showed BUN= 20, Creat= 1.65 ~  labs 11/09 here showed BUN= 12, Creat= 1.6 ~  labs 5/10 showed BUN= 14, creat= 1.2 ~  labs 11/10 showed BUN= 14, Creat= 1.1 ~  labs 5/11 showed BUN= 13, Creat= 1.2 ~  Labs 5/12 showed BUN= 12, Creat= 1.2 ~  Labs 11/12 showed BUN= 16, Creat= 1.2 ~  Labs 5/13 showed BUN=13, Creat= 1.3   Hx STROKE & SEIZURE DISORDER - eval by neuro/ DrSethi... he remains on ASA 81mg Bid,  AGGRENOX Bid, & KEPPRA 500mg Bid... he is stable without focal weakness, sensory changes, speech problems, etc...  ~  5/10 he denies memory problems, but in need of additional evaluation, DrSethi's note 12/09 doesn't indicate difficulty in this area. ~  11/10: continues to deny problems... ~  5/11:  states he had a recent seizure and is due for f/u w/ DrSethi soon... I am still concerned about his memory & affect. ~  11/11 & 5/12>  we do not have notes from Neuro to review... 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  MONOCLONAL GAMMOPATHY - full eval from DrShadad and latest notes reviewed... of interest the pt tells me he has been a regular blood donor and freq gave "double units"... 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 he has some free kappa light chains in the urine as well... ~  10/11:  f/u DrShadad w/ extensive labs reviewed> no evid progression, continues on observation. ~  11/12:  Now that DrShadad et al are in the Epic system his notes & labs are avail to review (yearly f/u each fall).   Past Surgical History  Procedure Date  . Cardiac catheterization 1987    Showed distal left circumflex 100% occluded   . Cardiac defibrillator placement 08/17/2006    Implantation of a [employer] single chamber defibrillator    Outpatient Encounter Prescriptions as of 12/18/2011  Medication Sig Dispense Refill  . Cellulose (AVICEL PH 105 MICRO CELLULOSE) POWD by Does not apply route as needed. Apply to infected area as needed.      . CLOPIDOGREL BISULFATE PO Take 75 mg by mouth daily.      Marland Kitchen glyBURIDE (DIABETA) 2.5 MG tablet Take 1/2 tab by mouth once daily       . levETIRAcetam (KEPPRA) 500 MG tablet Take 500 mg by mouth every 12 (twelve) hours.        Marland Kitchen  metoprolol tartrate (LOPRESSOR) 25 MG tablet Take 1/2 tab by mouth twice daily       . Multiple Vitamin (MULTIVITAMIN) tablet Take 1 tablet by mouth daily.        . naftifine (NAFTIN) 1 % cream Apply 1 application topically 2 (two) times daily. Apply to infected area between toes 2 times daily      . simvastatin (ZOCOR) 20 MG tablet Take one half  tablet by mouth daily at bedtime       . dipyridamole-aspirin (AGGRENOX) 25-200 MG per 12 hr capsule Take 1 capsule by mouth 2 (two) times daily.          Allergies  Allergen Reactions  . Atorvastatin     REACTION: pt states rash from Lipitor    Current Medications, Allergies, Past Medical History, Past Surgical History, Family History, and Social History were reviewed in [employer] record.   Review of Systems       Constitutional:   No  weight loss, night sweats,  Fevers, chills, fatigue, or  lassitude.  HEENT:   No headaches,  Difficulty swallowing,  Tooth/dental problems, or  Sore throat,                No sneezing, itching, ear ache, + nasal congestion, post nasal drip,   CV:  No chest pain,  Orthopnea, PND, swelling in lower extremities, anasarca, dizziness, palpitations, syncope.   GI  No heartburn, indigestion, abdominal pain, nausea, vomiting, diarrhea, change in bowel habits, loss of appetite, bloody stools.   Resp:    No change in color of mucus.   No chest wall deformity  Skin: no rash or lesions.  GU: no dysuria, change in color of urine, no urgency or frequency.  No flank pain, no hematuria   MS:  No joint pain or swelling.  No decreased range of motion.  No back pain.  Psych:  No change in mood or affect. No depression or anxiety.  No memory loss.       Objective:   Physical Exam    WD, WN, 66 y/o BM in NAD... GENERAL:  Alert & oriented; pleasant & cooperative... HEENT:  [place]/AT,  EACs-clear, TMs-wnl, NOSE-clear, THROAT-clear & wnl. NECK:  Supple w/ fairROM; no JVD; normal carotid impulses w/o bruits; no thyromegaly or nodules palpated; no lymphadenopathy CHEST:  Coarse BS  without wheezes/ rales/ or rhonchi. HEART:  regular, gr 1/6 SEM without rubs or gallops heard... ABDOMEN:  Soft & nontender; normal bowel sounds; no organomegaly or masses detected. EXT: without deformities or arthritic changes; no varicose veins/ venous  insuffic/ or edema. NEURO:    sensory testing normal; gait normal & balance OK. DERM:  No lesions noted; no rash etc...    Assessment & Plan:

## 2011-12-18 NOTE — Patient Instructions (Addendum)
Zpack take as directed.  Mucinex DM Twice daily  As needed  Cough/congestion  Saline nasal rinses As needed  Please contact office for sooner follow up if symptoms do not improve or worsen or seek emergency care  follow up Dr. Lenna Gilford  As planned

## 2011-12-18 NOTE — Assessment & Plan Note (Signed)
Flare Depo Medrol 80mg  IM x1   Plan Zpack take as directed.  Mucinex DM Twice daily  As needed  Cough/congestion  Saline nasal rinses As needed  Please contact office for sooner follow up if symptoms do not improve or worsen or seek emergency care  follow up Dr. Lenna Gilford  As planned

## 2012-01-06 ENCOUNTER — Other Ambulatory Visit: Payer: Self-pay | Admitting: Oncology

## 2012-01-06 ENCOUNTER — Other Ambulatory Visit (HOSPITAL_BASED_OUTPATIENT_CLINIC_OR_DEPARTMENT_OTHER): Payer: Medicare Other | Admitting: Lab

## 2012-01-06 DIAGNOSIS — D472 Monoclonal gammopathy: Secondary | ICD-10-CM

## 2012-01-06 LAB — COMPREHENSIVE METABOLIC PANEL (CC13)
ALT: 34 U/L (ref 0–55)
Albumin: 4 g/dL (ref 3.5–5.0)
CO2: 25 mEq/L (ref 22–29)
Chloride: 104 mEq/L (ref 98–107)
Potassium: 4.2 mEq/L (ref 3.5–5.1)
Sodium: 138 mEq/L (ref 136–145)
Total Bilirubin: 1 mg/dL (ref 0.20–1.20)
Total Protein: 7.1 g/dL (ref 6.4–8.3)

## 2012-01-06 LAB — CBC WITH DIFFERENTIAL/PLATELET
Basophils Absolute: 0 10*3/uL (ref 0.0–0.1)
HCT: 42.4 % (ref 38.4–49.9)
HGB: 14.3 g/dL (ref 13.0–17.1)
LYMPH%: 21.6 % (ref 14.0–49.0)
MCH: 31.3 pg (ref 27.2–33.4)
MONO#: 0.4 10*3/uL (ref 0.1–0.9)
NEUT%: 70.7 % (ref 39.0–75.0)
Platelets: 156 10*3/uL (ref 140–400)
WBC: 6.3 10*3/uL (ref 4.0–10.3)
lymph#: 1.4 10*3/uL (ref 0.9–3.3)

## 2012-01-12 ENCOUNTER — Telehealth: Payer: Self-pay | Admitting: Oncology

## 2012-01-12 LAB — SPEP & IFE WITH QIG
Alpha-2-Globulin: 9.7 % (ref 7.1–11.8)
Beta Globulin: 5.3 % (ref 4.7–7.2)
IgG (Immunoglobin G), Serum: 1380 mg/dL (ref 650–1600)
M-Spike, %: 0.97 g/dL
Total Protein, Serum Electrophoresis: 6.4 g/dL (ref 6.0–8.3)

## 2012-01-12 LAB — KAPPA/LAMBDA LIGHT CHAINS
Kappa:Lambda Ratio: 20.14 — ABNORMAL HIGH (ref 0.26–1.65)
Lambda Free Lght Chn: 0.21 mg/dL — ABNORMAL LOW (ref 0.57–2.63)

## 2012-01-12 NOTE — Telephone Encounter (Signed)
Pt came in to move 10/23 appt to [id number] due to a conflict   aom

## 2012-01-13 ENCOUNTER — Ambulatory Visit: Payer: Medicare Other | Admitting: Oncology

## 2012-01-21 ENCOUNTER — Ambulatory Visit: Payer: Medicare Other | Admitting: Oncology

## 2012-01-27 ENCOUNTER — Telehealth: Payer: Self-pay | Admitting: Oncology

## 2012-01-27 ENCOUNTER — Ambulatory Visit (HOSPITAL_BASED_OUTPATIENT_CLINIC_OR_DEPARTMENT_OTHER): Payer: Medicare Other | Admitting: Oncology

## 2012-01-27 VITALS — BP 137/87 | HR 67 | Temp 97.5°F | Resp 20 | Ht 69.0 in | Wt 201.5 lb

## 2012-01-27 DIAGNOSIS — D472 Monoclonal gammopathy: Secondary | ICD-10-CM

## 2012-01-27 NOTE — Telephone Encounter (Signed)
gv pt appt schedule for November 2014.

## 2012-01-27 NOTE — Progress Notes (Signed)
Hematology and Oncology Follow Up Visit  TREVOND TANGUMA [id number] 1946-02-21 66 y.o. 01/27/2012 9:47 AM   Principle Diagnosis: This is a 66 year old gentleman with monoclonal gammopathy of undetermined significance.  He has an IgG kappa subtype without any evidence of end-organ damage.  Current therapy: He is under observation and surveillance.  His M-spike continued to be less than 1 g/dL.  Interim History:  Mr. Spraggs presents today for a followup visit.  He is a pleasant gentleman with a history of a monoclonal gammopathy without any evidence to suggest end-organ damage.  He has been followed for the last 5 years and had not really developed anything to suggest multiple myeloma.  His skeletal survey was unrevealing in his IgG level continued to be within normal range, with an M-spike of less than 1 g/dL.  Since the last time I saw him, he had not had any hospitalization, had not had any illnesses.  He continued to perform activities of daily living without any hindrance or decline.  Medications: I have reviewed the patient's current medications. Current outpatient prescriptions:Cellulose (AVICEL PH 105 MICRO CELLULOSE) POWD, by Does not apply route as needed. Apply to infected area as needed., Disp: , Rfl: ;  CLOPIDOGREL BISULFATE PO, Take 75 mg by mouth daily., Disp: , Rfl: ;  dipyridamole-aspirin (AGGRENOX) 25-200 MG per 12 hr capsule, Take 1 capsule by mouth 2 (two) times daily.  , Disp: , Rfl: ;  glyBURIDE (DIABETA) 2.5 MG tablet, Take 1/2 tab by mouth once daily , Disp: , Rfl:  levETIRAcetam (KEPPRA) 500 MG tablet, Take 500 mg by mouth every 12 (twelve) hours.  , Disp: , Rfl: ;  metoprolol tartrate (LOPRESSOR) 25 MG tablet, Take 1/2 tab by mouth twice daily , Disp: , Rfl: ;  Multiple Vitamin (MULTIVITAMIN) tablet, Take 1 tablet by mouth daily.  , Disp: , Rfl: ;  naftifine (NAFTIN) 1 % cream, Apply 1 application topically 2 (two) times daily. Apply to infected area between toes 2 times  daily, Disp: , Rfl:  simvastatin (ZOCOR) 20 MG tablet, Take one half tablet by mouth daily at bedtime , Disp: , Rfl:   Allergies:  Allergies  Allergen Reactions  . Atorvastatin     REACTION: pt states rash from Lipitor    Past Medical History, Surgical history, Social history, and Family History were reviewed and updated.  Review of Systems: Constitutional:  Negative for fever, chills, night sweats, anorexia, weight loss, pain. Cardiovascular: no chest pain or dyspnea on exertion Respiratory: negative Neurological: negative Dermatological: negative ENT: negative Skin: Negative. Gastrointestinal: negative Genito-Urinary: negative Hematological and Lymphatic: negative Breast: negative Musculoskeletal: negative Remaining ROS negative. Physical Exam: Blood pressure 137/87, pulse 67, temperature 97.5 F (36.4 C), temperature source Oral, resp. rate 20, height 5\' 9"  (1.753 m), weight 201 lb 8 oz (91.4 kg). ECOG: 1 General appearance: alert Head: Normocephalic, without obvious abnormality, atraumatic Neck: no adenopathy, no carotid bruit, no JVD, supple, symmetrical, trachea midline and thyroid not enlarged, symmetric, no tenderness/mass/nodules Lymph nodes: Cervical, supraclavicular, and axillary nodes normal. Heart:regular rate and rhythm, S1, S2 normal, no murmur, click, rub or gallop Lung:chest clear, no wheezing, rales, normal symmetric air entry Abdomin: soft, non-tender, without masses or organomegaly EXT:no erythema, induration, or nodules   Lab Results: Lab Results  Component Value Date   WBC 6.3 01/06/2012   HGB 14.3 01/06/2012   HCT 42.4 01/06/2012   MCV 92.7 01/06/2012   PLT 156 01/06/2012     Chemistry  Component Value Date/Time   NA 138 01/06/2012 1355   NA 139 08/05/2011 1054   K 4.2 01/06/2012 1355   K 4.4 08/05/2011 1054   CL 104 01/06/2012 1355   CL 107 08/05/2011 1054   CO2 25 01/06/2012 1355   CO2 26 08/05/2011 1054   BUN 18.0 01/06/2012 1355     BUN 13 08/05/2011 1054   CREATININE 1.2 01/06/2012 1355   CREATININE 1.3 08/05/2011 1054      Component Value Date/Time   CALCIUM 9.5 01/06/2012 1355   CALCIUM 9.0 08/05/2011 1054   ALKPHOS 52 01/06/2012 1355   ALKPHOS 44 08/05/2011 1054   AST 17 01/06/2012 1355   AST 22 08/05/2011 1054   ALT 34 01/06/2012 1355   ALT 28 08/05/2011 1054   BILITOT 1.00 01/06/2012 1355   BILITOT 1.3* 08/05/2011 1054     Results for Maue, JAYCEAN ASHCROFT (MRN [id number]) as of 01/27/2012 09:51  Ref. Range 01/06/2012 00:00  Kappa free light chain Latest Range: 0.33-1.94 mg/dL 4.23 (H)  Kappa:Lambda Ratio Latest Range: 0.26-1.65  20.14 (H)  Lambda Free Lght Chn Latest Range: 0.57-2.63 mg/dL 0.21 (L)  Albumin ELP Latest Range: 55.8-66.1 % 58.2  COMMENT (PROTEIN ELECTROPHOR) No range found *  Alpha-1-Globulin Latest Range: 2.9-4.9 % 4.0  Alpha-2-Globulin Latest Range: 7.1-11.8 % 9.7  Beta Globulin Latest Range: 4.7-7.2 % 5.3  Beta 2 Latest Range: 3.2-6.5 % 3.2  Gamma Globulin Latest Range: 11.1-18.8 % 19.6 (H)  M-SPIKE, % No range found 0.97  SPE Interp. No range found *  IgG (Immunoglobin G), Serum Latest Range: [id number] mg/dL 1380  IgA Latest Range: 68-379 mg/dL 56 (L)  IgM, Serum Latest Range: 41-251 mg/dL 20 (L)  Total Protein, serum electrophor Latest Range: 6.0-8.3 g/dL 6.4    Impression and Plan:  A 66 year old gentleman with the following issues:  1. IgG kappa monoclonal gammopathy of undetermined significance without any evidence of end-organ damage to suggest multiple myeloma.  We will continue with observation, follow up on an annual basis.  Obviously if he develops any cytopenias or anything to suggest renal insufficiency, I will re-stage him with skull survey   and a bone marrow biopsy. 2. Seizure disorder.  He occasionally has had seizure activities, none recently for the time being. [facility], MD 11/6/20139:47 AM

## 2012-02-05 ENCOUNTER — Ambulatory Visit: Payer: Medicare Other | Admitting: Pulmonary Disease

## 2012-02-19 ENCOUNTER — Ambulatory Visit: Payer: Medicare Other | Admitting: Pulmonary Disease

## 2012-02-29 ENCOUNTER — Encounter: Payer: Medicare Other | Admitting: *Deleted

## 2012-03-01 ENCOUNTER — Encounter: Payer: Self-pay | Admitting: *Deleted

## 2012-03-02 ENCOUNTER — Ambulatory Visit (INDEPENDENT_AMBULATORY_CARE_PROVIDER_SITE_OTHER): Payer: Medicare Other | Admitting: *Deleted

## 2012-03-02 ENCOUNTER — Telehealth: Payer: Self-pay | Admitting: Internal Medicine

## 2012-03-02 ENCOUNTER — Encounter: Payer: Self-pay | Admitting: Internal Medicine

## 2012-03-02 DIAGNOSIS — Q248 Other specified congenital malformations of heart: Secondary | ICD-10-CM

## 2012-03-02 DIAGNOSIS — I498 Other specified cardiac arrhythmias: Secondary | ICD-10-CM

## 2012-03-02 DIAGNOSIS — Z95810 Presence of automatic (implantable) cardiac defibrillator: Secondary | ICD-10-CM

## 2012-03-02 NOTE — Telephone Encounter (Signed)
LMOM  Transmission was not received. Instructed pt to resend transmission/kwm

## 2012-03-08 LAB — REMOTE ICD DEVICE
BRDY-0002RV: 40 {beats}/min
DEVICE MODEL ICD: 449377
RV LEAD AMPLITUDE: 8.7 mv
TZAT-0001FASTVT: 1
TZON-0005SLOWVT: 6
TZON-0010FASTVT: 80 ms
TZON-0010SLOWVT: 80 ms
TZST-0001FASTVT: 2
TZST-0003FASTVT: 36 J
TZST-0003FASTVT: 36 J
VENTRICULAR PACING ICD: 1 pct

## 2012-03-14 ENCOUNTER — Encounter: Payer: Self-pay | Admitting: Pulmonary Disease

## 2012-03-14 ENCOUNTER — Ambulatory Visit (INDEPENDENT_AMBULATORY_CARE_PROVIDER_SITE_OTHER): Payer: Medicare Other | Admitting: Pulmonary Disease

## 2012-03-14 VITALS — BP 130/82 | HR 62 | Temp 97.1°F | Ht 69.0 in | Wt 205.0 lb

## 2012-03-14 DIAGNOSIS — Z95810 Presence of automatic (implantable) cardiac defibrillator: Secondary | ICD-10-CM

## 2012-03-14 DIAGNOSIS — Q248 Other specified congenital malformations of heart: Secondary | ICD-10-CM

## 2012-03-14 DIAGNOSIS — J449 Chronic obstructive pulmonary disease, unspecified: Secondary | ICD-10-CM

## 2012-03-14 DIAGNOSIS — R7309 Other abnormal glucose: Secondary | ICD-10-CM

## 2012-03-14 DIAGNOSIS — I2510 Atherosclerotic heart disease of native coronary artery without angina pectoris: Secondary | ICD-10-CM

## 2012-03-14 DIAGNOSIS — R569 Unspecified convulsions: Secondary | ICD-10-CM

## 2012-03-14 DIAGNOSIS — I498 Other specified cardiac arrhythmias: Secondary | ICD-10-CM

## 2012-03-14 DIAGNOSIS — I6350 Cerebral infarction due to unspecified occlusion or stenosis of unspecified cerebral artery: Secondary | ICD-10-CM

## 2012-03-14 DIAGNOSIS — D472 Monoclonal gammopathy: Secondary | ICD-10-CM

## 2012-03-14 DIAGNOSIS — E785 Hyperlipidemia, unspecified: Secondary | ICD-10-CM

## 2012-03-14 DIAGNOSIS — I10 Essential (primary) hypertension: Secondary | ICD-10-CM

## 2012-03-14 DIAGNOSIS — J4489 Other specified chronic obstructive pulmonary disease: Secondary | ICD-10-CM

## 2012-03-14 DIAGNOSIS — N259 Disorder resulting from impaired renal tubular function, unspecified: Secondary | ICD-10-CM

## 2012-03-14 NOTE — Patient Instructions (Addendum)
Today we updated your med list in our EPIC system...    Continue your current medications the same...  Call for any problems...  Let's plan a follow up visit in 6 months w/ FASTING blood work at that time.Marland KitchenMarland Kitchen

## 2012-03-14 NOTE — Progress Notes (Signed)
Subjective:    Patient ID: Brad Brown, male    DOB: March 21, 1946, 66 y.o.   MRN: [id number]  HPI 66 y/o BM here for a follow up visit... he has mult med problems as noted & he has mult specialty physicians tending to his medical needs... he gets his meds from the [facility]...  ~  Aug 06, 2009:  here for a 64mo follow up visit> the [facility] changed his Antara to SIMVASTATIN 20mg ; and they changed his prev Metformin to Glimepiride, and now to GLYBURIDE 2.5mg ... as usual he did not bring his meds to the OV despite numerous requests to get him to do so for [place] to review w/ him...    He saw [name] for Nephrology 12/10 for f/u of his chr renal insuffic- extensive labs done & Creat=1.2 (it was prev 1.5-1.8), BP controlled, no proteinuria, he knows to avoid NSAIDs...   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   He saw [name] for Cards 5/11 for f/u of his hx prior minor MI in [place] syndrome on EKG ident in 2008 (cath showed normal coronaries) & ICD placed by [name]... he is asymptomatic- no CP, palpit, discharges, dyspnea, edema, etc... no changes made to his meds.  ~  February 03, 2010:  he's had a good 64mo- "same" he says (he is here by himself once again today)... he didn't bring meds (again) & didn't bring list... he continues to f/u w/ DrShadad (10/11- MGUS, no change), [name] (6/11- Creat=1.2, improved), [name] (5/11- Brugada syndrome, norm coronaries on cath, stable)... he states that his BS is "doin pretty good";  he remains on ASA, Keppra, & Aggrenox- no seizures reported... 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  I believe pt has an underlying dementia & he is encouraged to follow up w/ neuro for further eval... He continues to f/u w/ his specialists every 77mo & their notes are reviewed...  See prob list below>>  ~  Aug 05, 2011:  64mo Woods says he is doing well, good 39mo interval w/o new complaints or concerns;  He gets all his meds filled at the [place] but unfortuately he never remembers to bring the meds or list to his office visits "I was in a hurry- watching [place]"; he thinks the [place] changed his Aggrenox but he can't say to what?  We reviewed his prob list, our med list, xrays & labs> see below>>    He had Cards f/u 11/12 by [name]> HBP, ?CAD, Brugada syndrome, defibrillator- cath 2008 showed normal C's & renal doppler's were wnl; Stable, no changes made... LABS 5/13:  FLP- all parameters at goals on Simva20;  Chems- wnl w/ BS=97 A1c=6.2 on Diabeta;  CBC- wnl;  TSH=2.00...  ~  March 14, 2012:  64mo ROV & Adolfo reports doing satis, no new complaints or concerns...  We  reviewed the following medical problems during today's office visit>> NOTE: he is getting regular general medical follow up at the [facility] & they provide all his meds (the [facility] [address] [facility]'t [facility] a [facility] in 50yr)>>    COPD> he had a mild exac10/13 treated by TP w/ ZPak, Depo80, Mucinex, Saline & resolved...    HBP> on Metop25-1/2Bid; BP=130/82 & he denies CP, palpit, dizzy, syncope, SOB, edema, etc...    CAD, Brugada syndrome> on Plavix75 now per the [place]; followed by [name] & [name]- their notes are reviewed...    Chol> on Simva20-1/2 tab; last FLP 5/13 showed TChol 112, TG 144, HDL 45, LDL 38    DM> on Glyburide2.5; last labs 5/13 revealed BS= 97, A1c= 6.2    Renal Insuffic> followed by [name] & Creat has been stable in the 1/1-1.3 range...    Hx Stroke> off Aggrenox per the [facility], they substituted PLAVIX75; he denies any cerebral ischemic symptoms...    Hx Seizure> on Keppra500Bid; doing well w/o recurrent seizures on the med...    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 We reviewed prob list, meds, xrays and labs> see below for updates >>  LABS 10/13 by DrShadad> reviewed in EPIC... LABS 9/13 by [name]> scanned into EPIC...          Problem List:  COPD - exsmoker quit >5 years now "I learned my lesson"... no regular meds... denies recurrent symptoms~ denies cough, sputum, hemoptysis, worsening dyspnea, wheezing, chest pain, snoring, daytime hypersomnolence, etc... ~  CXR 5/11 showed borderline cardiomeg, sl hyperinflation, clear lungs, pacer w/ AICD lead w/o change...  HBP, CAD & BRUGADA Syndrome - followed by Hilary Hertz & his notes are reviewed... 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 Myoview 2/08 showed sm inferolat infarct w/o ischemia & EF=59%... hosp 5/08 w/ seizure &  EKG showing Brugada syndrome- cath w/ normal coronaries, good LVF, and defibrillator  implanted... he remains on ASA, & METOPROLOL 25mg - 1/2Bid... ~  5/10: BP= 138/80 & even better at home, he says... no CP, palpit, dizzy, etc... ~  11/10: BP= 128/80 & feeling well- no CP, palpit, dizzy, etc... ~  5/11:  BP= 132/78 & feeling well- no complaints or concerns... ~  11/11:  BP= 120/80 & he is asymptomatic... ~  5/12:  BP= 132/76 & he continues asymptomatic... ~  11/12:  BP= 118/82 & he remains asymptomatic... ~  EKG 11/12 showed SBrady, rate59, IVCD/ pseudo-RBBB, otherw neg EKG... ~  5/13:  BP= 130/80 & feeling  well w/o CP, palpit, SOB, edema, etc... ~  9/13: He had f/u [name]- followed for non-ischemic cardiomyop, chr sys heart failure, HBP, etc; doing well w/ ICD- no discharges, stable, rec to continue follow up...  HYPERLIPIDEMIA - prev w/ fair control on Antara, but [facility] switched him to SIMVASTATIN 20mg /d. ~  FLP 5/09 showed TChol 121, TG 93, HDL 36, LDL 67 ~  FLP 5/10 on Antara130 showed TChol 143, TG 140, HDL 45, LDL 70 ~  FLP 11/10 on Antara130 showed TChol 143, TG 194, HDL 46, LDL 59... same med, better diet; but [facility] to Simva20. ~  FLP 5/11 on Simva20 showed TChol 122, TG 193, HDL 41, LDL 43 ~  FLP 11/11 on Simva20 showed TChol 137, TG 228, HDL 42, LDL 50... needs better low fat diet. ~  FLP 5/12 on Simva20 showed Tchol 141, TG 192, HDL 42, LDL 61 ~  FLP 11/12 on Simva20 showed TChol 133, TG 189, HDL 45, LDL 50... Continue same & better low fat diet. 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 ~  labs 11/09 on diet showed BS= 116, A1c= 7.1.Marland Kitchen. rec> start Metformin 500mg /d. ~  labs 5/10 on Metform500 showed BS= 96, A1c= 6.1.Marland KitchenMarland Kitchen continue same. ~  [facility] changed pt to Gimepiride 2mg /d... ~  labs 11/10 on Glimep2 showed BS= 78, A1c= 5.9.Marland Kitchen. rec> decr Glimep to 1/2 tab in AM; [facility] ch to Glybur2.5mg . ~  labs 5/11 on Glybur2.5-  1/2 tab showed BS= 79, A1c= 6.0 ~  labs 11/11 on Glybur2.5-1/2tab showed BS= 99, A1c= 6.4 ~  Labs 5/12 on Glybur2.5-1/2tab showed BS= 95, A1c=6.5 ~  Labs 11/12 on Glybur2.5-1/2tab showed BS= 83, A1c= 6.3 ~  1/13:  He had Eye eval DrTanner- neg, no retinopathy... ~  Labs 5/13 on Glybur2.5-1/2tab showed BS= 97, A1c= 6.2  Hx of RENAL INSUFFICIENCY - baseline Creat = 1.8.Marland Kitchen. he has hx of renal insuffic related to ACE/ ARB therapy & improved to baseline off this Rx... followed  by [facility]- notes & labs reviewed. ~  labs 2/09 by DrShadad w/ Cr=1.6 ~  labs 9/09 by [name] showed BUN= 20, Creat= 1.65 ~  labs 11/09 here showed BUN= 12, Creat= 1.6 ~  labs 5/10 showed BUN= 14, creat= 1.2 ~  labs 11/10 showed BUN= 14, Creat= 1.1 ~  labs 5/11 showed BUN= 13, Creat= 1.2 ~  Labs 5/12 showed BUN= 12, Creat= 1.2 ~  Labs 11/12 showed BUN= 16, Creat= 1.2 ~  Labs 5/13 showed BUN=13, Creat= 1.3  ~  10/13: he had f/u [name], Creat=1.2 stable, goals discussed w/ pt, note from Nephrology reviewed...  Hx ED >> followed by Urology & he has tried Cialis- both prn & daily use;  He has Primix shots for ED but he tells me that he is not using this med...  Hx STROKE & SEIZURE DISORDER - eval by neuro/ DrSethi... he remains on ASA 81mg Bid,  AGGRENOX Bid, & KEPPRA 500mg Bid... he is stable without focal weakness, sensory changes, speech problems, etc...  ~  5/10 he denies memory problems, but in need of additional evaluation, DrSethi's note 12/09 doesn't indicate difficulty in this area. ~  11/10: continues to deny problems... ~  5/11:  states he had a recent seizure and is due for f/u w/ DrSethi soon... I am still concerned about his memory &  affect. ~  11/11 & 5/12>  we do not have notes from Neuro to review... 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  MONOCLONAL GAMMOPATHY - full eval from DrShadad and latest notes reviewed... of interest the pt tells me he has been a regular blood donor and freq gave "double units"... 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 he has some free kappa light chains in the urine as well... ~  8/08:  Neg metastatic bone survey; mild compression T6, degenerative cerv spondylosis... ~  10/11:  f/u DrShadad w/ extensive labs reviewed> no evid progression, continues on observation. ~  11/12:  Now that DrShadad et al are in the Epic system his notes & labs are avail to review (yearly f/u each fall). ~  11/13:  He had f/u DrShadad> MGUS, IgG Kappa subtype w/o any end-organ damage; on observation & Mspike continues to be <1gm/dL...   Past Surgical History  Procedure Date  . Cardiac catheterization 1987    Showed distal left circumflex 100% occluded   . Cardiac defibrillator placement 08/17/2006    Implantation of a St. Jude single chamber defibrillator    Outpatient Encounter Prescriptions as of 03/14/2012  Medication Sig Dispense Refill  . Cellulose (AVICEL PH 105 MICRO CELLULOSE) POWD by Does not apply route as needed. Apply to infected area as needed.      . CLOPIDOGREL BISULFATE PO Take 75 mg by mouth daily.      Marland Kitchen dipyridamole-aspirin (AGGRENOX) 25-200 MG per 12 hr capsule Take 1 capsule by mouth 2 (two) times daily.        Marland Kitchen glyBURIDE (DIABETA) 2.5 MG tablet Take 1/2 tab by mouth once daily       . levETIRAcetam (KEPPRA) 500 MG tablet Take 500 mg by mouth every 12 (twelve) hours.        . metoprolol tartrate (LOPRESSOR) 25 MG tablet Take 1/2 tab by mouth twice daily       . Multiple Vitamin (MULTIVITAMIN) tablet Take 1 tablet by mouth daily.        . naftifine (NAFTIN) 1 % cream Apply  1 application topically 2 (two) times daily. Apply to infected area between toes 2 times daily      . simvastatin (ZOCOR) 20 MG tablet Take one half tablet by mouth daily at bedtime         Allergies  Allergen Reactions  . Atorvastatin     REACTION: pt states rash from Lipitor    Current Medications, Allergies, Past Medical History, Past Surgical History, Family History, and Social History were reviewed in [employer] record.   Review of Systems        See HPI - all other systems neg except as noted... The patient denies anorexia, fever, weight loss, weight gain, vision loss, decreased hearing, hoarseness, chest pain, syncope, dyspnea on exertion, peripheral edema, prolonged cough, headaches, hemoptysis, abdominal pain, melena, hematochezia, severe indigestion/heartburn, hematuria, incontinence, muscle weakness, suspicious skin lesions, transient blindness, difficulty walking, depression, unusual weight change, abnormal bleeding, enlarged lymph nodes, and angioedema.     Objective:  Physical Exam    WD, WN, 66 y/o BM in NAD... GENERAL:  Alert & oriented; pleasant & cooperative... HEENT:  [place]/AT, EOM-wnl, PERRLA, EACs-clear, TMs-wnl, NOSE-clear, THROAT-clear & wnl. NECK:  Supple w/ fairROM; no JVD; normal carotid impulses w/o bruits; no thyromegaly or nodules palpated; no lymphadenopathy. CHEST:  Clear to P & A; without wheezes/ rales/ or rhonchi. HEART:  regular, gr 1/6 SEM without rubs or gallops heard... ABDOMEN:  Soft & nontender; normal bowel sounds; no organomegaly or masses detected. EXT: without deformities or arthritic changes; no varicose veins/ venous insuffic/ or edema. NEURO:  CN's intact; motor testing normal; sensory testing normal; gait normal & balance OK. DERM:  No lesions noted; no rash etc...  RADIOLOGY DATA:  Reviewed in the EPIC EMR & discussed w/ the patient...  LABORATORY DATA:  Reviewed in the EPIC EMR & discussed w/ the  patient...   Assessment & Plan:   COPD>  He quit smoking >47yrs ago & is currently asymptomatic...  HBP/ CAD/ Brugada syndrome>  followed by Hilary Hertz & [name] for Cards & AICD- stable...  HYPERLIPIDEMIA>  On Simva20 per the [facility] + low fat diet;  F/u FLP is improved & all parameters are at goals...  DM>  [facility] Glyburide [facility], discussed diet, exercise, BS monitoring at home etc; he denies hypoglycemic episodes & A1c= 6.2...  RENAL>  Creat= 1.3, and he continues to see [name] every 15mo w/ extensive labs done each visit!  Hx Stroke/ Seizure>  On Keppra & Aggrenox- stable w/o cerebral ischemic symptoms...  MGUS>  followed by DrShadad w/ labs each Autumn...   Patient's Medications  New Prescriptions   No medications on file  Previous Medications   CELLULOSE (AVICEL PH 105 MICRO CELLULOSE) POWD    by Does not apply route as needed. Apply to infected area as needed.   CLOPIDOGREL BISULFATE PO    Take 75 mg by mouth daily.   GLYBURIDE (DIABETA) 2.5 MG TABLET    Take 1/2 tab by mouth once daily    LEVETIRACETAM (KEPPRA) 500 MG TABLET    Take 500 mg by mouth every 12 (twelve) hours.     METOPROLOL TARTRATE (LOPRESSOR) 25 MG TABLET    Take 1/2 tab by mouth twice daily    MULTIPLE VITAMIN (MULTIVITAMIN) TABLET    Take 1 tablet by mouth daily.     NAFTIFINE (NAFTIN) 1 % CREAM    Apply 1 application topically 2 (two) times daily. Apply to infected area between toes 2 times daily   SIMVASTATIN (ZOCOR) 20 MG TABLET    Take one half tablet by mouth daily at bedtime   Modified Medications   No medications on file  Discontinued Medications   DIPYRIDAMOLE-ASPIRIN (AGGRENOX) 25-200 MG PER 12 HR CAPSULE    Take 1 capsule by mouth 2 (two) times daily.

## 2012-03-25 ENCOUNTER — Encounter: Payer: Self-pay | Admitting: *Deleted

## 2012-06-08 ENCOUNTER — Telehealth: Payer: Self-pay | Admitting: *Deleted

## 2012-06-08 NOTE — Telephone Encounter (Signed)
Spoke w/pt and pt to send transmission. Left message for Nevin Bloodgood to check Merlin.

## 2012-06-08 NOTE — Telephone Encounter (Signed)
Plz return call to patient at hm# [phone]  Patient device fired last night 7pm.  No chest pains at this time, just wanted to let someone know.

## 2012-06-09 ENCOUNTER — Encounter: Payer: Self-pay | Admitting: Internal Medicine

## 2012-06-09 ENCOUNTER — Telehealth: Payer: Self-pay | Admitting: *Deleted

## 2012-06-09 ENCOUNTER — Ambulatory Visit (INDEPENDENT_AMBULATORY_CARE_PROVIDER_SITE_OTHER): Payer: Medicare Other | Admitting: Internal Medicine

## 2012-06-09 VITALS — BP 130/88 | HR 80 | Resp 16

## 2012-06-09 DIAGNOSIS — I471 Supraventricular tachycardia: Secondary | ICD-10-CM | POA: Insufficient documentation

## 2012-06-09 DIAGNOSIS — I4891 Unspecified atrial fibrillation: Secondary | ICD-10-CM

## 2012-06-09 DIAGNOSIS — Z95810 Presence of automatic (implantable) cardiac defibrillator: Secondary | ICD-10-CM

## 2012-06-09 MED ORDER — RIVAROXABAN 20 MG PO TABS: 20.0000 mg | ORAL_TABLET | Freq: Every day | ORAL | Status: DC

## 2012-06-09 MED ORDER — METOPROLOL TARTRATE 25 MG PO TABS: ORAL_TABLET | ORAL | Status: DC

## 2012-06-09 NOTE — Assessment & Plan Note (Signed)
The pt has newly diagnosed atrial fibrillation with prior hx of stroke   From risk reduction perspective it is appropriate to use anticoagulants instead of antiplatlet drugs.  We will make the change, but need to get ok from hematology as the presence of monoclonal gammopathy syndrome may have specific implications regarding bleeding and clotting risks  We increased the betablocker gently for augmented ratee control  He was only on very small dose  i did not reprogram the ICD as the very rapid rate was prob better terminated   His SVT discrimiators are IF ANY and would suggest that we change to IF ALL

## 2012-06-09 NOTE — Telephone Encounter (Signed)
Left message for patient to stop Plavix and start Xarelto as ordered.

## 2012-06-09 NOTE — Patient Instructions (Addendum)
Your physician has recommended you make the following change in your medication:  1) Start xarelto 20 mg once daily. 2) Increase metoprolol tartrate to 25 mg one whole tablet by mouth twice daily.  Your physician recommends that you schedule a follow-up appointment in: 1 month with Dr. Lovena Le.

## 2012-06-09 NOTE — Assessment & Plan Note (Signed)
The patient's device was interrogated and the information was fully reviewed.  The device was NOT reprogrammed  As above

## 2012-06-09 NOTE — Progress Notes (Signed)
Patient Care Team: Noralee Space, MD as PCP - General (Pulmonary Disease)   HPI  Brad Brown is a 67 y.o. male Seen as an add on today 2/2 ICD shock  This turned out to be inappropriate for AF which was about 240bpm  It became regular and was cardioverted  He has no known AF  He has prior stroke  He had ischemic cardiomyopathy and chf  Recently diagnosed with monoclonoal gammopathy   Past Medical History  Diagnosis Date  . COPD (chronic obstructive pulmonary disease)   . CAD (coronary artery disease)   . Other specified congenital anomaly of heart   . Automatic implantable cardiac defibrillator in situ   . Hyperlipidemia   . Borderline diabetes mellitus   . GERD (gastroesophageal reflux disease)   . Colonic polyp   . History of renal insufficiency syndrome   . Stroke   . Seizure disorder   . Monoclonal gammopathy     Past Surgical History  Procedure Laterality Date  . Cardiac catheterization  1987    Showed distal left circumflex 100% occluded   . Cardiac defibrillator placement  08/17/2006    Implantation of a St. Jude single chamber defibrillator    Current Outpatient Prescriptions  Medication Sig Dispense Refill  . Cellulose (AVICEL PH 105 MICRO CELLULOSE) POWD by Does not apply route as needed. Apply to infected area as needed.      . CLOPIDOGREL BISULFATE PO Take 75 mg by mouth daily.      Marland Kitchen glyBURIDE (DIABETA) 2.5 MG tablet Take one tablet by mouth daily      . levETIRAcetam (KEPPRA) 500 MG tablet Take one tablet in the morning and two tablets at night      . metoprolol tartrate (LOPRESSOR) 25 MG tablet Take 1 tab by mouth twice daily  180 tablet  3  . Multiple Vitamin (MULTIVITAMIN) tablet Take 1 tablet by mouth daily.        . naftifine (NAFTIN) 1 % cream Apply 1 application topically 2 (two) times daily. Apply to infected area between toes 2 times daily      . simvastatin (ZOCOR) 20 MG tablet Take one half tablet by mouth daily at bedtime       .  Rivaroxaban (XARELTO) 20 MG TABS Take 1 tablet (20 mg total) by mouth daily.  90 tablet  3   No current facility-administered medications for this visit.    Allergies  Allergen Reactions  . Atorvastatin     REACTION: pt states rash from Lipitor    Review of Systems negative except from HPI and PMH  Physical Exam BP 130/88  Pulse 80  Resp 16 Well developed and nourished in no acute distress HENT normal Neck supple with JVP-flat Clear Regular rate and rhythm, no murmurs or gallops Abd-soft with active BS No Clubbing cyanosis edema Skin-warm and dry A & Oriented  Grossly normal sensory and motor function    Assessment and  Plan

## 2012-06-17 ENCOUNTER — Telehealth: Payer: Self-pay | Admitting: Pulmonary Disease

## 2012-06-17 NOTE — Telephone Encounter (Signed)
[email or web address]  

## 2012-06-20 NOTE — Telephone Encounter (Signed)
Pt advised. Shirleen Mcfaul, CMA  

## 2012-06-20 NOTE — Telephone Encounter (Signed)
Per SN---  Its a good med just a bit more expensive.  Keep appts with cardiology for follow up.  thanks

## 2012-06-20 NOTE — Telephone Encounter (Signed)
Patient states he had an intestinal virus x 2 weeks ago and everytime hes has theswe viruses it causes him to "violently" vomit in which then causes his defibrillator to go off. Most recently (2 weeks ago) with the vominting patients defib went of 3 times. Patient states it was at this time Dr. Verl Blalock switched him from Plavix 75mg  daily to Xarelto 20mg  daily (06/09/12--per OV note Dr. Caryl Comes saw pt) Patient would like Dr. Lenna Gilford to know about this change and know if Dr. Lenna Gilford suggests anything different. Will forward to Dr. Lenna Gilford, and if any recs please advise, thank you  Last OV:03/14/12 Next OV; 09/12/12

## 2012-06-22 ENCOUNTER — Telehealth: Payer: Self-pay | Admitting: Internal Medicine

## 2012-06-22 NOTE — Telephone Encounter (Signed)
New problem   [facility] stated meditation [facility] Xarelto 20mg  was too expensive and they won't pay for it anymore for the pt. Pt need to know what can he do because he need to continue taking the medication. Pt had a 10 day supply and only have a few pills left. Pt will like to speak to nurse concerning this matter.

## 2012-06-22 NOTE — Telephone Encounter (Signed)
Spoke with patient regarding concern that Xarelto is too expensive for him to continue to take.  Patient has appointment with Dr. Lovena Le 5/1.  Offered to leave enough samples at front desk to carry patient through to that appointment.  Patient agrees with plan of care.

## 2012-07-21 ENCOUNTER — Ambulatory Visit (INDEPENDENT_AMBULATORY_CARE_PROVIDER_SITE_OTHER): Payer: Medicare Other | Admitting: Internal Medicine

## 2012-07-21 ENCOUNTER — Encounter: Payer: Self-pay | Admitting: Internal Medicine

## 2012-07-21 VITALS — BP 157/87 | HR 60 | Ht 69.0 in | Wt 201.4 lb

## 2012-07-21 DIAGNOSIS — I219 Acute myocardial infarction, unspecified: Secondary | ICD-10-CM

## 2012-07-21 DIAGNOSIS — Z8679 Personal history of other diseases of the circulatory system: Secondary | ICD-10-CM

## 2012-07-21 DIAGNOSIS — I4891 Unspecified atrial fibrillation: Secondary | ICD-10-CM

## 2012-07-21 DIAGNOSIS — Z95810 Presence of automatic (implantable) cardiac defibrillator: Secondary | ICD-10-CM

## 2012-07-21 LAB — ICD DEVICE OBSERVATION
BATTERY VOLTAGE: 2.5718 V
DEV-0020ICD: NEGATIVE
HV IMPEDENCE: 50 Ohm
RV LEAD AMPLITUDE: 10.3 mv
RV LEAD IMPEDENCE ICD: 440 Ohm
RV LEAD THRESHOLD: 1 V
TOT-0008: 1
TOT-0009: 4
TOT-0010: 36
TZAT-0004FASTVT: 8
TZAT-0018FASTVT: NEGATIVE
TZAT-0019FASTVT: 7.5 V
TZAT-0020FASTVT: 1 ms
TZON-0003FASTVT: 300 ms
TZON-0003SLOWVT: 350 ms
TZON-0004FASTVT: 30
TZON-0005FASTVT: 6
TZON-0005SLOWVT: 6
TZON-0010FASTVT: 80 ms
TZST-0001FASTVT: 3
TZST-0001FASTVT: 4

## 2012-07-21 MED ORDER — METOPROLOL TARTRATE 25 MG PO TABS: ORAL_TABLET | ORAL | Status: DC

## 2012-07-21 MED ORDER — WARFARIN SODIUM 5 MG PO TABS: 5.0000 mg | ORAL_TABLET | Freq: Every day | ORAL | Status: DC

## 2012-07-21 NOTE — Patient Instructions (Addendum)
Your physician wants you to follow-up in: 12 months with Dr Hezzie Bump will receive a reminder letter in the mail two months in advance. If you don't receive a letter, please call our office to schedule the follow-up appointment.   Remote monitoring is used to monitor your Pacemaker of ICD from home. This monitoring reduces the number of office visits required to check your device to one time per year. It allows [place] to keep an eye on the functioning of your device to ensure it is working properly. You are scheduled for a device check from home on 10/24/12. You may send your transmission at any time that day. If you have a wireless device, the transmission will be sent automatically. After your physician reviews your transmission, you will receive a postcard with your next transmission date.  Your physician has recommended you make the following change in your medication:  1) Stop Xarelto  2) Start Coumadin 5mg  on 07/21/12  Follow up in the [facility] clinic on 07/27/12 at 10:30am

## 2012-07-21 NOTE — Assessment & Plan Note (Signed)
His blood pressure is slightly elevated today. I've asked the patient to further reduce his salt intake and increase his physical activity.

## 2012-07-21 NOTE — Progress Notes (Signed)
HPI Brad Brown returns today for followup. He is a very pleasant 67 year old man with coronary artery disease, and ischemic cardiomyopathy, chronic systolic heart failure, status post ICD implantation. He returns today for followup. The patient denies chest pain. He has class II heart failure symptoms. The patient has done well in the interim. No peripheral edema.  His only complaint today is over the cost of his normal anticoagulant. He would like to switch back to warfarin. Allergies  Allergen Reactions  . Atorvastatin     REACTION: pt states rash from Lipitor     Current Outpatient Prescriptions  Medication Sig Dispense Refill  . Cellulose (AVICEL PH 105 MICRO CELLULOSE) POWD by Does not apply route as needed. Apply to infected area as needed.      . glyBURIDE (DIABETA) 2.5 MG tablet Take one tablet by mouth daily      . levETIRAcetam (KEPPRA) 500 MG tablet Take one tablet in the morning and two tablets at night      . metoprolol tartrate (LOPRESSOR) 25 MG tablet Take 1 tab by mouth twice daily  60 tablet  3  . Multiple Vitamin (MULTIVITAMIN) tablet Take 1 tablet by mouth daily.        . naftifine (NAFTIN) 1 % cream Apply 1 application topically 2 (two) times daily. Apply to infected area between toes 2 times daily      . Rivaroxaban (XARELTO) 20 MG TABS Take 1 tablet (20 mg total) by mouth daily.  90 tablet  3  . simvastatin (ZOCOR) 20 MG tablet Take one half tablet by mouth daily at bedtime       . warfarin (COUMADIN) 5 MG tablet Take 1 tablet (5 mg total) by mouth daily.  30 tablet  1   No current facility-administered medications for this visit.     Past Medical History  Diagnosis Date  . COPD (chronic obstructive pulmonary disease)   . CAD (coronary artery disease)   . Other specified congenital anomaly of heart   . Automatic implantable cardiac defibrillator in situ   . Hyperlipidemia   . Borderline diabetes mellitus   . GERD (gastroesophageal reflux disease)   . Colonic  polyp   . History of renal insufficiency syndrome   . Stroke   . Seizure disorder   . Monoclonal gammopathy     ROS:   All systems reviewed and negative except as noted in the HPI.   Past Surgical History  Procedure Laterality Date  . Cardiac catheterization  1987    Showed distal left circumflex 100% occluded   . Cardiac defibrillator placement  08/17/2006    Implantation of a St. Jude single chamber defibrillator     Family History  Problem Relation Age of Onset  . Heart attack Brother      History   Social History  . Marital Status: Single    Spouse Name: N/A    Number of Children: 1  . Years of Education: N/A   Occupational History  .     Social History Main Topics  . Smoking status: Former Smoker -- 0.50 packs/day for 15 years    Types: Cigarettes    Quit date: 03/23/2004  . Smokeless tobacco: Never Used  . Alcohol Use: No     Comment: quit 2-3 years ago  . Drug Use: No  . Sexually Active: Not on file   Other Topics Concern  . Not on file   Social History Narrative   ICD-St. Jude  Remote-Yes  BP 157/87  Pulse 60  Ht 5\' 9"  (1.753 m)  Wt 201 lb 6.4 oz (91.354 kg)  BMI 29.73 kg/m2  Physical Exam:  Well appearing 67 year old man,NAD HEENT: Unremarkable Neck:  No JVD, no thyromegally Lungs:  Clear with no wheezes, rales, or rhonchi. HEART:  Regular rate rhythm, no murmurs, no rubs, no clicks Abd:  soft, positive bowel sounds, no organomegally, no rebound, no guarding Ext:  2 plus pulses, no edema, no cyanosis, no clubbing Skin:  No rashes no nodules Neuro:  CN II through XII intact, motor grossly intact  EKG normal sinus rhythm with PACs  DEVICE  Normal device function.  See PaceArt for details.   Assess/Plan:

## 2012-07-21 NOTE — Assessment & Plan Note (Addendum)
He is maintaining sinus rhythm. We discussed the pros and cons of switching anticoagulant therapies.  He would like to go back to warfarin. I've asked the patient to stop taking Xarelto and start back warfarin. He'll come back for an INR evaluation in several days

## 2012-07-21 NOTE — Assessment & Plan Note (Signed)
His ICD is working normally. We'll plan to recheck in several months.

## 2012-07-26 ENCOUNTER — Ambulatory Visit (INDEPENDENT_AMBULATORY_CARE_PROVIDER_SITE_OTHER): Payer: Medicare Other | Admitting: *Deleted

## 2012-07-26 DIAGNOSIS — I4891 Unspecified atrial fibrillation: Secondary | ICD-10-CM

## 2012-08-01 ENCOUNTER — Ambulatory Visit (INDEPENDENT_AMBULATORY_CARE_PROVIDER_SITE_OTHER): Payer: Medicare Other | Admitting: *Deleted

## 2012-08-01 DIAGNOSIS — I4891 Unspecified atrial fibrillation: Secondary | ICD-10-CM

## 2012-08-08 ENCOUNTER — Ambulatory Visit (INDEPENDENT_AMBULATORY_CARE_PROVIDER_SITE_OTHER): Payer: Medicare Other | Admitting: Pharmacist

## 2012-08-08 DIAGNOSIS — I4891 Unspecified atrial fibrillation: Secondary | ICD-10-CM

## 2012-08-16 ENCOUNTER — Ambulatory Visit (INDEPENDENT_AMBULATORY_CARE_PROVIDER_SITE_OTHER): Payer: Medicare Other | Admitting: *Deleted

## 2012-08-16 DIAGNOSIS — I4891 Unspecified atrial fibrillation: Secondary | ICD-10-CM

## 2012-08-16 LAB — POCT INR: INR: 1.5

## 2012-08-23 ENCOUNTER — Ambulatory Visit (INDEPENDENT_AMBULATORY_CARE_PROVIDER_SITE_OTHER): Payer: Medicare Other | Admitting: *Deleted

## 2012-08-23 DIAGNOSIS — I4891 Unspecified atrial fibrillation: Secondary | ICD-10-CM

## 2012-08-23 LAB — POCT INR: INR: 1.6

## 2012-09-07 ENCOUNTER — Ambulatory Visit (INDEPENDENT_AMBULATORY_CARE_PROVIDER_SITE_OTHER): Payer: Medicare Other | Admitting: Pharmacist

## 2012-09-07 DIAGNOSIS — I4891 Unspecified atrial fibrillation: Secondary | ICD-10-CM

## 2012-09-07 LAB — POCT INR: INR: 2.3

## 2012-09-07 MED ORDER — WARFARIN SODIUM 5 MG PO TABS: 5.0000 mg | ORAL_TABLET | Freq: Every day | ORAL | Status: DC

## 2012-09-12 ENCOUNTER — Ambulatory Visit: Payer: Medicare Other | Admitting: Pulmonary Disease

## 2012-09-21 ENCOUNTER — Ambulatory Visit (INDEPENDENT_AMBULATORY_CARE_PROVIDER_SITE_OTHER): Payer: Medicare Other | Admitting: Pharmacist

## 2012-09-21 DIAGNOSIS — I4891 Unspecified atrial fibrillation: Secondary | ICD-10-CM

## 2012-10-07 ENCOUNTER — Ambulatory Visit (INDEPENDENT_AMBULATORY_CARE_PROVIDER_SITE_OTHER): Payer: Medicare Other | Admitting: *Deleted

## 2012-10-07 DIAGNOSIS — I10 Essential (primary) hypertension: Secondary | ICD-10-CM

## 2012-10-07 DIAGNOSIS — I2510 Atherosclerotic heart disease of native coronary artery without angina pectoris: Secondary | ICD-10-CM

## 2012-10-07 DIAGNOSIS — I4891 Unspecified atrial fibrillation: Secondary | ICD-10-CM

## 2012-10-07 DIAGNOSIS — E785 Hyperlipidemia, unspecified: Secondary | ICD-10-CM

## 2012-10-07 DIAGNOSIS — I219 Acute myocardial infarction, unspecified: Secondary | ICD-10-CM

## 2012-10-07 DIAGNOSIS — Z7901 Long term (current) use of anticoagulants: Secondary | ICD-10-CM

## 2012-10-07 LAB — BASIC METABOLIC PANEL
CO2: 23 mEq/L (ref 19–32)
Calcium: 8.7 mg/dL (ref 8.4–10.5)
Chloride: 105 mEq/L (ref 96–112)
Glucose, Bld: 72 mg/dL (ref 70–99)
Sodium: 136 mEq/L (ref 135–145)

## 2012-10-07 LAB — CBC WITH DIFFERENTIAL/PLATELET
Basophils Absolute: 0 10*3/uL (ref 0.0–0.1)
Basophils Relative: 0.8 % (ref 0.0–3.0)
Eosinophils Absolute: 0.1 10*3/uL (ref 0.0–0.7)
Hemoglobin: 13.7 g/dL (ref 13.0–17.0)
Lymphocytes Relative: 21.8 % (ref 12.0–46.0)
Lymphs Abs: 1.2 10*3/uL (ref 0.7–4.0)
MCHC: 34 g/dL (ref 30.0–36.0)
MCV: 90 fl (ref 78.0–100.0)
Monocytes Absolute: 0.4 10*3/uL (ref 0.1–1.0)
Neutro Abs: 3.9 10*3/uL (ref 1.4–7.7)
RBC: 4.48 Mil/uL (ref 4.22–5.81)
RDW: 13.3 % (ref 11.5–14.6)

## 2012-10-07 LAB — POCT INR: INR: 2.6

## 2012-10-07 NOTE — Progress Notes (Signed)
Pt was started on Xarelto 20 with largest meal of day for AF on 10/07/2012  Reviewed patients medication list.  Pt is not currently on any combined P-gp and strong CYP3A4 inhibitors/inducers (ketoconazole, traconazole, ritonavir, carbamazepine, phenytoin, rifampin, St. John's wort).  Reviewed labs.  SCr 1.4 , Weight 91.34 kg, CrCl- 68.6 .  Dose appropriate based on CrCl.   Hgb and HCT 13.7 & 40.4 .  A full discussion of the nature of anticoagulants has been carried out.  A benefit/risk analysis has been presented to the patient, so that they understand the justification for choosing anticoagulation with Xarelto at this time.  The need for compliance is stressed.  Pt is aware to take the medication once daily with the largest meal of the day.  Side effects of potential bleeding are discussed, including unusual colored urine or stools, coughing up blood or coffee ground emesis, nose bleeds or serious fall or head trauma.  Discussed signs and symptoms of stroke. The patient should avoid any OTC items containing aspirin or ibuprofen.  Avoid alcohol consumption.   Call if any signs of abnormal bleeding.  Discussed financial obligations and resolved any difficulty in obtaining medication.  Next lab test test in 1 month with appt made.

## 2012-10-17 ENCOUNTER — Encounter: Payer: Self-pay | Admitting: Pulmonary Disease

## 2012-10-17 ENCOUNTER — Ambulatory Visit (INDEPENDENT_AMBULATORY_CARE_PROVIDER_SITE_OTHER): Payer: Medicare Other | Admitting: Pulmonary Disease

## 2012-10-17 VITALS — BP 142/78 | HR 53 | Temp 97.7°F | Ht 69.0 in | Wt 200.0 lb

## 2012-10-17 DIAGNOSIS — I498 Other specified cardiac arrhythmias: Secondary | ICD-10-CM

## 2012-10-17 DIAGNOSIS — I6350 Cerebral infarction due to unspecified occlusion or stenosis of unspecified cerebral artery: Secondary | ICD-10-CM

## 2012-10-17 DIAGNOSIS — N259 Disorder resulting from impaired renal tubular function, unspecified: Secondary | ICD-10-CM

## 2012-10-17 DIAGNOSIS — R7309 Other abnormal glucose: Secondary | ICD-10-CM

## 2012-10-17 DIAGNOSIS — F419 Anxiety disorder, unspecified: Secondary | ICD-10-CM

## 2012-10-17 DIAGNOSIS — J449 Chronic obstructive pulmonary disease, unspecified: Secondary | ICD-10-CM

## 2012-10-17 DIAGNOSIS — R569 Unspecified convulsions: Secondary | ICD-10-CM

## 2012-10-17 DIAGNOSIS — E785 Hyperlipidemia, unspecified: Secondary | ICD-10-CM

## 2012-10-17 DIAGNOSIS — Z95810 Presence of automatic (implantable) cardiac defibrillator: Secondary | ICD-10-CM

## 2012-10-17 DIAGNOSIS — I10 Essential (primary) hypertension: Secondary | ICD-10-CM

## 2012-10-17 DIAGNOSIS — D472 Monoclonal gammopathy: Secondary | ICD-10-CM

## 2012-10-17 DIAGNOSIS — I2510 Atherosclerotic heart disease of native coronary artery without angina pectoris: Secondary | ICD-10-CM

## 2012-10-17 DIAGNOSIS — Q248 Other specified congenital malformations of heart: Secondary | ICD-10-CM

## 2012-10-17 DIAGNOSIS — I4891 Unspecified atrial fibrillation: Secondary | ICD-10-CM

## 2012-10-17 DIAGNOSIS — J4489 Other specified chronic obstructive pulmonary disease: Secondary | ICD-10-CM

## 2012-10-17 NOTE — Patient Instructions (Addendum)
Today we updated your med list in our EPIC system...    Continue your current medications the same...  Work on your low carb low fat diet & get the weight down...  Call for any questions...  Let's plan a follow up visit in 25mo, sooner if needed for problems.Marland KitchenMarland Kitchen

## 2012-10-17 NOTE — Progress Notes (Signed)
Subjective:    Patient ID: Brad Brown, male    DOB: [birth year], 67 y.o.   MRN: [id number]  HPI 67 y/o BM here for a follow up visit... he has mult med problems as noted & he has mult specialty physicians tending to his medical needs... he gets his meds from the [facility]...  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  I believe pt has an underlying dementia & he is encouraged to follow up w/ neuro for further eval... He continues to f/u w/ his specialists every 81mo & their notes are reviewed...  See prob list below>>  ~  Aug 05, 2011:  28mo ROV & Brad Brown says he is doing well, good 19mo interval w/o new complaints or concerns;  He gets all his meds filled at the [place] but unfortuately he never remembers to bring the meds or list to his office visits "I was in a hurry- watching [place]"; he thinks the [place] changed his Aggrenox but he can't say to what?  We reviewed his prob list, our med list, xrays & labs> see below>>    He had Cards f/u 11/12 by [name]> HBP, ?CAD, Brugada syndrome, defibrillator- cath 2008 showed normal C's & renal doppler's were wnl; Stable, no changes made... LABS 5/13:  FLP- all parameters at goals on Simva20;  Chems- wnl w/ BS=97 A1c=6.2 on Diabeta;  CBC- wnl;  TSH=2.00...  ~  March 14, 2012:  41mo ROV & Brad Brown reports doing satis, no new complaints or concerns...  We reviewed the following medical  problems during today's office visit>> NOTE: he is getting regular general medical follow up at the [facility] & they provide all his meds (the [facility] [address] [facility]'t [facility] a [facility] in 49yr)>>    COPD> he had a mild exac10/13 treated by TP w/ ZPak, Depo80, Mucinex, Saline & resolved...    HBP> on Metop25-1/2Bid; BP=130/82 & he denies CP, palpit, dizzy, syncope, SOB, edema, etc...    CAD, Brugada syndrome> on Plavix75 now per the [place]; followed by [name] & [name]- their notes are reviewed...    Chol> on Simva20-1/2 tab; last FLP 5/13 showed TChol 112, TG 144, HDL 45, LDL 38    DM> on Glyburide2.5; last labs 5/13 revealed BS= 97, A1c= 6.2    Renal Insuffic> followed by [name] & Creat has been stable in the 1/1-1.3 range...    Hx Stroke> off Aggrenox per the [facility], they substituted [id number]; he denies any cerebral ischemic symptoms...    Hx Seizure> on Keppra500Bid; doing well w/o recurrent seizures on the med...    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 We reviewed prob list, meds, xrays and labs> see below for updates >>  LABS 10/13 by DrShadad> reviewed in EPIC... LABS 9/13 by [name]> scanned into EPIC...   ~  October 17, 2012:  38mo ROV & Brad Brown had an AICD shock 3/14- seen by [name] w/ interrogation revealing inapprop shock for rapid AFib=> cardioverted; I note he has not had a 2DEcho in many yrs; EP felt he needed anticoag instead of antiplatlet Rx- initially started on Xarelto, then changed to Coumadin, now back on Xarelto apparently because the [place] has just started covering for this med... They also incr his Metoprolol to 25mg  Bid, he is maintaining NSR... He last saw [name] in 2012 & was told to f/u as needed... We reviewed the following medical problems during today's office visit >>     COPD> he had a mild exac10/13 treated by TP w/ ZPak, Depo80, Mucinex, Saline & resolved; no prob since then...    HBP> on Metop25Bid; BP=142/78 & he denies CP, palpit,  dizzy, syncope, SOB, edema, etc...    CAD, Brugada syndrome, new PAF> off Plavix75 now & on XARELTO15- see above; followed by [name]- his notes are reviewed...    Chol> on Simva40-1/2 tab; last FLP 6/14 at [facility] showed TChol 152, TG 274, HDL 44, LDL 53; needs better low fat diet...    DM> on Glyburide2.5; last labs 6/14 at [facility] revealed BS= 97, A1c= 6.1    Renal Insuffic> followed by [name] & Creat has been stable in the 1.1-1.3 range; labs 6/14 from [place] showed BUN=21, Creat=1.5    Hx Stroke> off Aggrenox & on XARELTO15 now; he denies any cerebral ischemic symptoms...    Hx Seizure> on Keppra500Tid; doing well w/o recurrent seizures on the med...    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 We reviewed prob list, meds, xrays and labs> see below for updates >>  LABS 6/14 from [facility] scanned into EPIC...            Problem List:  COPD - exsmoker quit >5 years now "I learned my lesson"... no regular meds... denies recurrent symptoms~ denies cough, sputum, hemoptysis, worsening dyspnea, wheezing, chest pain, snoring, daytime hypersomnolence, etc... ~  CXR 5/11 showed borderline cardiomeg, sl hyperinflation, clear lungs, pacer w/ AICD lead w/o change... ~  He had a mild exac10/13 treated by TP w/ ZPak, Depo80, Mucinex, Saline & resolved; no prob since then...  HBP, CAD & BRUGADA Syndrome, new onset PAF 3/14 per [name] >> ~  Followed by Hilary Hertz & his notes are reviewed... 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 Myoview 2/08 showed sm inferolat infarct w/o ischemia & EF=59%... hosp 5/08 w/ seizure &  EKG showing Brugada syndrome- cath w/ normal coronaries, good LVF, and defibrillator implanted... he remains on ASA, & METOPROLOL 25mg - 1/2Bid... ~  5/10: BP= 138/80 & even better at home, he says... no CP, palpit, dizzy, etc... ~  11/10: BP= 128/80 & feeling well- no CP, palpit, dizzy,  etc... ~  5/11:  BP= 132/78 & feeling well- no complaints or concerns... ~  11/11:  BP= 120/80 & he is asymptomatic... ~  5/12:  BP= 132/76 & he continues asymptomatic... ~  11/12:  BP= 118/82 & he remains asymptomatic... ~  EKG 11/12 showed SBrady, rate59, IVCD/ pseudo-RBBB, otherw neg EKG... ~  5/13:  BP= 130/80 & feeling well w/o CP, palpit, SOB, edema, etc... ~  9/13:  He had f/u [name]- followed for non-ischemic cardiomyop, chr sys heart failure, HBP, etc; doing well w/ ICD- no discharges, stable, rec to continue follow up... ~  3/14:  Brad Brown had an AICD shock 3/14- seen by [name] w/ interrogation revealing inapprop shock for rapid AFib=> cardioverted; EP felt he needed anticoag instead of antiplatlet Rx- initially started on Xarelto, then changed to Coumadin, now back on Xarelto apparently because the [place] has just started covering for this med... They also incr his Metoprolol to 25mg  Bid, he is maintaining NSR... He last saw [name] in 2012 & was told to f/u as needed; I note he has not had a 2DEcho in many yrs... ~  7/14:  BP= 142/78 & holding NSR on Metop25Bid & Xarelto15; he denies CP, notes occas palpit, denies SOB, edema, etc...  HYPERLIPIDEMIA - prev w/ fair control on Antara, but [facility] switched him to SIMVASTATIN 20mg /d. ~  FLP 5/09 showed TChol 121, TG 93, HDL 36, LDL 67 ~  FLP 5/10 on Antara130 showed TChol 143, TG 140, HDL 45, LDL 70 ~  FLP 11/10 on Antara130 showed TChol 143, TG 194, HDL 46, LDL 59... same med, better diet; but [facility] ch to Simva20. ~  FLP 5/11 on Simva20 showed TChol 122, TG 193, HDL 41, LDL 43 ~  FLP 11/11 on Simva20 showed TChol 137, TG 228, HDL 42, LDL 50... needs better low fat diet. ~  FLP 5/12 on Simva20 showed Tchol 141, TG 192, HDL 42, LDL 61 ~  FLP 11/12 on Simva20 showed TChol 133, TG 189, HDL 45, LDL 50... Continue same & better low fat diet. ~  FLP 5/13 on Simva20 showed TChol 112, TG 144, HDL 45, LDL 38 ~  FLP 6/14 at the [place] on Simva20 showed TChol  152, TG 274, HDL 44, LDL 53; needs better low fat diet.   DIABETES MELLITUS, BORDERLINE (ICD-790.29) - prev on Metformin but [facility] changed to Glimep2mg , then to GLYBURIDE 2.5mg - taking 1/2 tab Qam. ~  labs 11/09 on diet showed BS= 116, A1c= 7.1.Marland Kitchen. rec> start Metformin 500mg /d. ~  labs 5/10 on Metform500 showed BS= 96, A1c= 6.1.Marland KitchenMarland Kitchen continue same. ~  [facility] changed pt to Gimepiride 2mg /d... ~  labs 11/10 on Glimep2 showed BS= 78, A1c= 5.9.Marland Kitchen. rec> decr Glimep to 1/2 tab in AM; [facility] ch to Glybur2.5mg . ~  labs 5/11 on Glybur2.5- 1/2 tab showed BS= 79, A1c= 6.0 ~  labs 11/11 on Glybur2.5-1/2tab showed BS= 99, A1c= 6.4 ~  Labs 5/12 on Glybur2.5-1/2tab showed BS= 95, A1c=6.5 ~  Labs 11/12 on Glybur2.5-1/2tab showed BS= 83, A1c= 6.3 ~  1/13:  He had Eye eval DrTanner- neg, no retinopathy... ~  Labs 5/13 on Glybur2.5-1/2tab showed BS= 97, A1c= 6.2 ~  12/13: note from [facility], Ophthalmology- no DM retinopathy... ~  7/14: on Glyburide2.5; last labs 6/14 at [facility] revealed BS= 97, A1c= 6.1  Hx of RENAL INSUFFICIENCY -  ~  baseline Creat = 1.8; he has hx of renal insuffic related to ACE/ ARB therapy & improved to baseline off this Rx; followed  by [facility]- notes & labs reviewed. ~  labs 2/09 by DrShadad w/ Cr=1.6 ~  labs 9/09 by [name] showed BUN= 20, Creat= 1.65 ~  labs 11/09 here showed BUN= 12, Creat= 1.6 ~  labs 5/10 showed BUN= 14, creat= 1.2 ~  labs 11/10 showed BUN= 14, Creat= 1.1 ~  labs 5/11 showed BUN= 13, Creat= 1.2 ~  Labs 5/12 showed BUN= 12, Creat= 1.2 ~  Labs 11/12 showed BUN= 16, Creat= 1.2 ~  Labs 5/13 showed BUN=13, Creat= 1.3  ~  10/13: he had yearly f/u [name], Creat=1.2 stable, goals discussed w/  pt, note from Nephrology reviewed... ~  7/14:  followed by [name] & Creat has been stable in the 1.1-1.3 range; labs 6/14 from [place] showed BUN=21, Creat=1.5  Hx ED >> followed by Urology & he has tried Cialis- both prn & daily use;  He has Primix shots for ED but he tells me  that he is not using this med... ~  12/13: he had yearly f/u DrTannenbaum> bladder neck obstruction & ED on Cialis5mg /d & PEP injections (TRIMIX)  Hx STROKE & SEIZURE DISORDER - eval by neuro/ DrSethi... he remains on ASA 81mg Bid,  AGGRENOX Bid, & KEPPRA 500mg Bid... he is stable without focal weakness, sensory changes, speech problems, etc...  ~  5/10 he denies memory problems, but in need of additional evaluation, DrSethi's note 12/09 doesn't indicate difficulty in this area. ~  11/10: continues to deny problems... ~  5/11:  states he had a recent seizure and is due for f/u w/ DrSethi soon... I am still concerned about his memory & affect. ~  11/11 & 5/12>  we do not have notes from Neuro to review... 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  MONOCLONAL GAMMOPATHY - full eval from DrShadad and latest notes reviewed... of interest the pt tells me he has been a regular blood donor and freq gave "double units"... 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 he has some free kappa light chains in the urine as well... ~  8/08:  Neg metastatic bone survey; mild compression T6, degenerative cerv spondylosis... ~  10/11:  f/u DrShadad w/ extensive labs reviewed> no evid progression, continues on observation. ~  11/12:  Now that DrShadad et al are in the Epic system his notes & labs are avail to review (yearly f/u each fall). 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   Past Surgical History  Procedure Laterality Date  . Cardiac  catheterization  1987    Showed distal left circumflex 100% occluded   . Cardiac defibrillator placement  08/17/2006    Implantation of a St. Jude single chamber defibrillator    Outpatient Encounter Prescriptions as of 10/17/2012  Medication Sig Dispense Refill  . Cellulose (AVICEL PH 105 MICRO CELLULOSE) POWD by Does not apply route as needed. Apply to infected area as needed.      . glyBURIDE (DIABETA) 2.5 MG tablet Take one tablet by mouth daily      . levETIRAcetam (KEPPRA) 500 MG tablet Take one tablet in the morning and two tablets at night      . metoprolol tartrate (LOPRESSOR) 25 MG tablet Take 1 tab by mouth twice daily  60 tablet  3  . Multiple Vitamin (MULTIVITAMIN) tablet Take 1 tablet by mouth daily.        . naftifine (NAFTIN) 1 % cream Apply 1 application topically 2 (two) times daily. Apply to infected area between toes 2 times daily      . Rivaroxaban (XARELTO) 20 MG TABS Take 20 mg by mouth every evening. Take  with largest meal of day Patient gets med from [place] [facility]      . simvastatin (ZOCOR) 20 MG tablet Take one half tablet by mouth daily at bedtime        No facility-administered encounter medications on file as of 10/17/2012.    Allergies  Allergen Reactions  . Atorvastatin     REACTION: pt states rash from Lipitor    Current Medications, Allergies, Past Medical History, Past Surgical History, Family History, and Social History were reviewed in [employer] record.   Review of Systems        See HPI - all other systems neg except as noted... The patient denies anorexia, fever, weight loss, weight gain, vision loss, decreased hearing, hoarseness, chest pain, syncope, dyspnea on exertion, peripheral edema, prolonged cough, headaches, hemoptysis, abdominal pain, melena, hematochezia, severe indigestion/heartburn, hematuria, incontinence, muscle weakness, suspicious skin lesions, transient blindness, difficulty walking, depression, unusual  weight change, abnormal bleeding, enlarged lymph nodes, and angioedema.     Objective:   Physical Exam    WD, WN, 67 y/o BM in NAD... GENERAL:  Alert & oriented; pleasant & cooperative... HEENT:  [place]/AT, EOM-wnl, PERRLA, EACs-clear, TMs-wnl, NOSE-clear, THROAT-clear & wnl. NECK:  Supple w/ fairROM; no JVD; normal carotid impulses w/o bruits; no thyromegaly or nodules palpated; no lymphadenopathy. CHEST:  Clear to P & A; without wheezes/ rales/ or rhonchi. HEART:  regular, gr 1/6 SEM without rubs or gallops heard... ABDOMEN:  Soft & nontender; normal bowel sounds; no organomegaly or masses detected. EXT: without deformities or arthritic changes; no varicose veins/ venous insuffic/ or edema. NEURO:  CN's intact; motor testing normal; sensory testing normal; gait normal & balance OK. DERM:  No lesions noted; no rash etc...  RADIOLOGY DATA:  Reviewed in the EPIC EMR & discussed w/ the patient...  LABORATORY DATA:  Reviewed in the EPIC EMR & discussed w/ the patient...   Assessment & Plan:    COPD>  He quit smoking >48yrs ago & is currently asymptomatic...  HBP/ CAD/ Brugada syndrome/ PAF>  followed by [name] for Cards w/ AICD- stable, on Metoprolol25Bid & Xarelto15...  HYPERLIPIDEMIA>  On Simva20 per the [facility] + low fat diet;  F/u FLP is ok x elevTG & advised on low fat diet, get wt down...  DM>  On Glyburide monotherapy per the [facility], discussed diet, exercise, BS monitoring at home etc; he denies hypoglycemic episodes & A1c= 6.1.Marland KitchenMarland Kitchen  RENAL>  Creat= 1.5 recently, and he continues to see [name] yearly...  Hx Stroke/ Seizure>  On Keppra & stable w/o cerebral ischemic symptoms...  MGUS>  followed by DrShadad w/ labs each Autumn...   Patient's Medications  New Prescriptions   No medications on file  Previous Medications   CELLULOSE (AVICEL PH 105 MICRO CELLULOSE) POWD    by Does not apply route as needed. Apply to infected area as needed.   GLYBURIDE (DIABETA) 2.5 MG TABLET     Take one tablet by mouth daily   LEVETIRACETAM (KEPPRA) 500 MG TABLET    Take one tablet in the morning and two tablets at night   METOPROLOL TARTRATE (LOPRESSOR) 25 MG TABLET    Take 1 tab by mouth twice daily   MULTIPLE VITAMIN (MULTIVITAMIN) TABLET    Take 1 tablet by mouth daily.     NAFTIFINE (NAFTIN) 1 % CREAM    Apply 1 application topically 2 (two) times daily. Apply to infected area between toes 2 times daily   RIVAROXABAN (XARELTO) 15 MG TABS  TABLET    Take 15 mg by mouth daily.   SIMVASTATIN (ZOCOR) 20 MG TABLET    Take one half tablet by mouth daily at bedtime   Modified Medications   No medications on file  Discontinued Medications   RIVAROXABAN (XARELTO) 20 MG TABS    Take 20 mg by mouth every evening. Take with largest meal of day Patient gets med from [place]

## 2012-10-18 ENCOUNTER — Encounter: Payer: Self-pay | Admitting: Pulmonary Disease

## 2012-10-24 ENCOUNTER — Encounter: Payer: Medicare Other | Admitting: *Deleted

## 2012-10-25 ENCOUNTER — Encounter: Payer: Self-pay | Admitting: *Deleted

## 2012-11-04 ENCOUNTER — Encounter: Payer: Self-pay | Admitting: Internal Medicine

## 2012-11-04 ENCOUNTER — Ambulatory Visit (INDEPENDENT_AMBULATORY_CARE_PROVIDER_SITE_OTHER): Payer: Medicare Other | Admitting: *Deleted

## 2012-11-04 ENCOUNTER — Ambulatory Visit: Payer: Medicare Other | Admitting: *Deleted

## 2012-11-04 ENCOUNTER — Telehealth: Payer: Self-pay | Admitting: Internal Medicine

## 2012-11-04 DIAGNOSIS — I4891 Unspecified atrial fibrillation: Secondary | ICD-10-CM

## 2012-11-04 NOTE — Telephone Encounter (Signed)
Instructed pt to send transmission at his convenience.

## 2012-11-04 NOTE — Telephone Encounter (Signed)
New Problem  Pt states he missed his previous remote defib test and wants to resch//

## 2012-11-04 NOTE — Progress Notes (Signed)
Pt on Xarelto for AF started in July 2014, this is 1 month check Patient has had no adverse reactions with medication 8/18 called and report lab results drawn on 8/15 to patient answering machine  Reviewed patients medication list.  Pt is not currently on any combined P-gp and strong CYP3A4 inhibitors/inducers (ketoconazole, traconazole, ritonavir, carbamazepine, phenytoin, rifampin, St. John's wort).  Reviewed labs.  SCr 1.4, Weight 90.71, CrCl- 68.39  Dose appropriate based on CrCl.   Hgb 14.0 and HCT 38.4  A full discussion of the nature of anticoagulants has been carried out.  A benefit/risk analysis has been presented to the patient, so that they understand the justification for choosing anticoagulation with Xarelto at this time.  The need for compliance is stressed.  Pt is aware to take the medication once daily with the largest meal of the day.  Side effects of potential bleeding are discussed, including unusual colored urine or stools, coughing up blood or coffee ground emesis, nose bleeds or serious fall or head trauma.  Discussed signs and symptoms of stroke. The patient should avoid any OTC items containing aspirin or ibuprofen.  Avoid alcohol consumption.   Call if any signs of abnormal bleeding.  Discussed financial obligations and resolved any difficulty in obtaining medication.  Next lab test test in 6 months.

## 2012-11-07 ENCOUNTER — Encounter: Payer: Self-pay | Admitting: Internal Medicine

## 2012-11-07 LAB — CBC WITH DIFFERENTIAL/PLATELET
Eosinophils Absolute: 0.1 10*3/uL (ref 0.0–0.7)
Hemoglobin: 14 g/dL (ref 13.0–17.0)
Lymphocytes Relative: 28 % (ref 12–46)
Lymphs Abs: 1.2 10*3/uL (ref 0.7–4.0)
MCH: 31.2 pg (ref 26.0–34.0)
Monocytes Relative: 9 % (ref 3–12)
Neutro Abs: 2.7 10*3/uL (ref 1.7–7.7)
Neutrophils Relative %: 60 % (ref 43–77)
Platelets: 179 10*3/uL (ref 150–400)
RBC: 4.49 MIL/uL (ref 4.22–5.81)
WBC: 4.4 10*3/uL (ref 4.0–10.5)

## 2012-11-07 LAB — BASIC METABOLIC PANEL
BUN: 15 mg/dL (ref 6–23)
Calcium: 9.2 mg/dL (ref 8.4–10.5)
Creat: 1.4 mg/dL — ABNORMAL HIGH (ref 0.50–1.35)

## 2012-11-08 LAB — REMOTE ICD DEVICE
BRDY-0002RV: 40 {beats}/min
DEV-0020ICD: NEGATIVE
DEVICE MODEL ICD: 449377
HV IMPEDENCE: 53 Ohm
RV LEAD AMPLITUDE: 9.6 mv
TZAT-0001FASTVT: 1
TZAT-0004FASTVT: 8
TZAT-0019FASTVT: 7.5 V
TZAT-0020FASTVT: 1 ms
TZON-0004FASTVT: 30
TZON-0005FASTVT: 6
TZON-0010FASTVT: 80 ms
TZON-0010SLOWVT: 80 ms
TZST-0001FASTVT: 4
TZST-0001FASTVT: 5
TZST-0003FASTVT: 25 J
TZST-0003FASTVT: 36 J

## 2012-11-11 ENCOUNTER — Encounter: Payer: Self-pay | Admitting: *Deleted

## 2012-11-15 ENCOUNTER — Encounter: Payer: Self-pay | Admitting: Internal Medicine

## 2012-12-14 ENCOUNTER — Encounter: Payer: Self-pay | Admitting: Pulmonary Disease

## 2012-12-14 ENCOUNTER — Ambulatory Visit (INDEPENDENT_AMBULATORY_CARE_PROVIDER_SITE_OTHER): Payer: Medicare Other | Admitting: Pulmonary Disease

## 2012-12-14 ENCOUNTER — Telehealth: Payer: Self-pay | Admitting: Pulmonary Disease

## 2012-12-14 VITALS — BP 136/84 | HR 60 | Temp 97.9°F | Ht 69.0 in | Wt 201.6 lb

## 2012-12-14 DIAGNOSIS — I10 Essential (primary) hypertension: Secondary | ICD-10-CM

## 2012-12-14 DIAGNOSIS — I4891 Unspecified atrial fibrillation: Secondary | ICD-10-CM

## 2012-12-14 DIAGNOSIS — B3789 Other sites of candidiasis: Secondary | ICD-10-CM

## 2012-12-14 DIAGNOSIS — Z95810 Presence of automatic (implantable) cardiac defibrillator: Secondary | ICD-10-CM

## 2012-12-14 DIAGNOSIS — Z23 Encounter for immunization: Secondary | ICD-10-CM

## 2012-12-14 DIAGNOSIS — E785 Hyperlipidemia, unspecified: Secondary | ICD-10-CM

## 2012-12-14 DIAGNOSIS — D472 Monoclonal gammopathy: Secondary | ICD-10-CM

## 2012-12-14 DIAGNOSIS — R569 Unspecified convulsions: Secondary | ICD-10-CM

## 2012-12-14 DIAGNOSIS — R7309 Other abnormal glucose: Secondary | ICD-10-CM

## 2012-12-14 DIAGNOSIS — I2510 Atherosclerotic heart disease of native coronary artery without angina pectoris: Secondary | ICD-10-CM

## 2012-12-14 MED ORDER — CLOTRIMAZOLE-BETAMETHASONE 1-0.05 % EX CREA: TOPICAL_CREAM | CUTANEOUS | Status: DC

## 2012-12-14 MED ORDER — FLUCONAZOLE 100 MG PO TABS: ORAL_TABLET | ORAL | Status: DC

## 2012-12-14 NOTE — Telephone Encounter (Signed)
Called, spoke with pt -  Pt reports he has an area below his penis on the scrotal sac that looks like a red rash and looks like it is "skinned up."  Pt states it is also itching and is painful from this.  He believes area is bleeding bc it looks like some blood has gotten on his underwear.  This started x 2-3 days ago.  He is requesting OV today.  SN or TP has no openings.  Please advise.  Thank you.  Pt would also like SN to know that once he finishes the 15 mg of xarelto that he currently has, the [place] has increased this to 20 mg.    Last OV with SN:  10/17/12  Allergies  Allergen Reactions  . Atorvastatin     REACTION: pt states rash from Lipitor

## 2012-12-14 NOTE — Patient Instructions (Addendum)
Today we updated your med list in our EPIC system...    Continue your current medications the same...  For the groin rash>>    Take the Diflucan tabs 100mg - 2 tabs today then one tab daily til gone...    Use the Lortisone cream twice daily as we discussed...  Call for any questions.Marland KitchenMarland Kitchen

## 2012-12-14 NOTE — Progress Notes (Signed)
Subjective:    Patient ID: Brad Brown, male    DOB: [birth year], 67 y.o.   MRN: [id number]  HPI 67 y/o BM here for a follow up visit... he has mult med problems as noted & he has mult specialty physicians tending to his medical needs... he gets his meds from the [facility]...  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  I believe pt has an underlying dementia & he is encouraged to follow up w/ neuro for further eval... He continues to f/u w/ his specialists every 81mo & their notes are reviewed...  See prob list below>>  ~  Aug 05, 2011:  28mo ROV & [name] says he is doing well, good 19mo interval w/o new complaints or concerns;  He gets all his meds filled at the [place] but unfortuately he never remembers to bring the meds or list to his office visits "I was in a hurry- watching [place]"; he thinks the [place] changed his Aggrenox but he can't say to what?  We reviewed his prob list, our med list, xrays & labs> see below>>    He had Cards f/u 11/12 by [name]> HBP, ?CAD, Brugada syndrome, defibrillator- cath 2008 showed normal C's & renal doppler's were wnl; Stable, no changes made... LABS 5/13:  FLP- all parameters at goals on Simva20;  Chems- wnl w/ BS=97 A1c=6.2 on Diabeta;  CBC- wnl;  TSH=2.00...  ~  March 14, 2012:  41mo ROV & [name] reports doing satis, no new complaints or concerns...  We reviewed the following medical  problems during today's office visit>> NOTE: he is getting regular general medical follow up at the [facility] & they provide all his meds (the [facility] [address] [facility]'t [facility] in 49yr)>>    COPD> he had a mild exac10/13 treated by TP w/ ZPak, Depo80, Mucinex, Saline & resolved...    HBP> on Metop25-1/2Bid; BP=130/82 & he denies CP, palpit, dizzy, syncope, SOB, edema, etc...    CAD, Brugada syndrome> on Plavix75 now per the [place]; followed by [name] & [name]- their notes are reviewed...    Chol> on Simva20-1/2 tab; last FLP 5/13 showed TChol 112, TG 144, HDL 45, LDL 38    DM> on Glyburide2.5; last labs 5/13 revealed BS= 97, A1c= 6.2    Renal Insuffic> followed by [name] & Creat has been stable in the 1/1-1.3 range...    Hx Stroke> off Aggrenox per the [facility], they substituted [id number]; he denies any cerebral ischemic symptoms...    Hx Seizure> on Keppra500Bid; doing well w/o recurrent seizures on the med...    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 We reviewed prob list, meds, xrays and labs> see below for updates >>  LABS 10/13 by DrShadad> reviewed in EPIC... LABS 9/13 by [name]> scanned into EPIC...   ~  October 17, 2012:  67mo ROV & [name] had an AICD shock 3/14- seen by [name] w/ interrogation revealing inapprop shock for rapid AFib=> cardioverted; I note he has not had a 2DEcho in many yrs; EP felt he needed anticoag instead of antiplatlet Rx- initially started on Xarelto, then changed to Coumadin, now back on Xarelto apparently because the [place] has just started covering for this med... They also incr his Metoprolol to 25mg  Bid, he is maintaining NSR... He last saw [name] in 2012 & was told to f/u as needed... We reviewed the following medical problems during today's office visit >>     COPD> he had a mild exac10/13 treated by TP w/ ZPak, Depo80, Mucinex, Saline & resolved; no prob since then...    HBP> on Metop25Bid; BP=142/78 & he denies CP, palpit,  dizzy, syncope, SOB, edema, etc...    CAD, Brugada syndrome, new PAF> off Plavix75 now & on XARELTO15- see above; followed by [name]- his notes are reviewed...    Chol> on Simva40-1/2 tab; last FLP 6/14 at [facility] showed TChol 152, TG 274, HDL 44, LDL 53; needs better low fat diet...    DM> on Glyburide2.5; last labs 6/14 at [name] revealed BS= 97, A1c= 6.1    Renal Insuffic> followed by [name] & Creat has been stable in the 1.1-1.3 range; labs 6/14 from [place] showed BUN=21, Creat=1.5    Hx Stroke> off Aggrenox & on XARELTO15 now; he denies any cerebral ischemic symptoms...    Hx Seizure> on Keppra500Tid; doing well w/o recurrent seizures on the med...    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 We reviewed prob list, meds, xrays and labs> see below for updates >>  LABS 6/14 from [name] scanned into EPIC...    ~  December 14, 2012:  Pt called today requesting OV to check groin rash> states it's been present for 3-4d rash, raw, irritated & pruritic, in the intertrig folds & he's tried a slave he got "at the [employer] store";  Exam shows characteristic rash of cutaneous candida & is sl excoriated etc;  We discussed Rx w/ Diflucan orally & Lotrisone cream topically, +local care/ keep skin off skin/ etc... If not resolved we will send him to [facility]...    He notes that his breathing is good, BP=136/84, no CP/ palpit/ SOB/ edema, DM control has been good on meds, etc;  He is reminded to drink lots of water...           Problem List:  COPD - exsmoker quit >5 years now "I learned my lesson"... no regular meds... denies recurrent symptoms~ denies cough, sputum, hemoptysis, worsening dyspnea, wheezing, chest pain, snoring, daytime hypersomnolence, etc... ~  CXR 5/11 showed borderline cardiomeg, sl hyperinflation, clear lungs, pacer w/ AICD lead w/o change... ~  He had a mild exac10/13 treated by TP w/ ZPak, Depo80, Mucinex, Saline & resolved; no prob since then...  HBP, CAD & BRUGADA  Syndrome, new onset PAF 3/14 per [name] >> ~  Followed by Hilary Hertz & his notes are reviewed... 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 Myoview 2/08 showed sm inferolat infarct w/o ischemia & EF=59%... hosp 5/08 w/ seizure &  EKG showing Brugada syndrome- cath w/ normal coronaries, good LVF, and defibrillator implanted... he remains on ASA, & METOPROLOL 25mg - 1/2Bid... ~  5/10: BP= 138/80 & even better at home, he says... no CP,  palpit, dizzy, etc... ~  11/10: BP= 128/80 & feeling well- no CP, palpit, dizzy, etc... ~  5/11:  BP= 132/78 & feeling well- no complaints or concerns... ~  11/11:  BP= 120/80 & he is asymptomatic... ~  5/12:  BP= 132/76 & he continues asymptomatic... ~  11/12:  BP= 118/82 & he remains asymptomatic... ~  EKG 11/12 showed SBrady, rate59, IVCD/ pseudo-RBBB, otherw neg EKG... ~  5/13:  BP= 130/80 & feeling well w/o CP, palpit, SOB, edema, etc... ~  9/13:  He had f/u [name]- followed for non-ischemic cardiomyop, chr sys heart failure, HBP, etc; doing well w/ ICD- no discharges, stable, rec to continue follow up... ~  3/14:  [place] had an AICD shock 3/14- seen by [name] w/ interrogation revealing inapprop shock for rapid AFib=> cardioverted; EP felt he needed anticoag instead of antiplatlet Rx- initially started on Xarelto, then changed to Coumadin, now back on Xarelto apparently because the [place] has just started covering for this med... They also incr his Metoprolol to 25mg  Bid, he is maintaining NSR... He last saw [name] in 2012 & was told to f/u as needed; I note he has not had a 2DEcho in many yrs... ~  7/14:  BP= 142/78 & holding NSR on Metop25Bid & Xarelto15; he denies CP, notes occas palpit, denies SOB, edema, etc...  HYPERLIPIDEMIA - prev w/ fair control on Antara, but [facility] switched him to SIMVASTATIN 20mg /d. ~  FLP 5/09 showed TChol 121, TG 93, HDL 36, LDL 67 ~  FLP 5/10 on  Antara130 showed TChol 143, TG 140, HDL 45, LDL 70 ~  FLP 11/10 on Antara130 showed TChol 143, TG 194, HDL 46, LDL 59... same med, better diet; but [facility] ch to Simva20. ~  FLP 5/11 on Simva20 showed TChol 122, TG 193, HDL 41, LDL 43 ~  FLP 11/11 on Simva20 showed TChol 137, TG 228, HDL 42, LDL 50... needs better low fat diet. ~  FLP 5/12 on Simva20 showed Tchol 141, TG 192, HDL 42, LDL 61 ~  FLP 11/12 on Simva20 showed TChol 133, TG 189, HDL 45, LDL 50... Continue same & better low fat diet. ~  FLP 5/13 on Simva20 showed TChol 112, TG 144, HDL 45, LDL 38 ~  FLP 6/14 at the [place] on Simva20 showed TChol 152, TG 274, HDL 44, LDL 53; needs better low fat diet.   DIABETES MELLITUS, BORDERLINE (ICD-790.29) - prev on Metformin but [facility] changed to Glimep2mg , then to GLYBURIDE 2.5mg - taking 1/2 tab Qam. ~  labs 11/09 on diet showed BS= 116, A1c= 7.1.Marland Kitchen. rec> start Metformin 500mg /d. ~  labs 5/10 on Metform500 showed BS= 96, A1c= 6.1.Marland KitchenMarland Kitchen continue same. ~  [facility] changed pt to Gimepiride 2mg /d... ~  labs 11/10 on Glimep2 showed BS= 78, A1c= 5.9.Marland Kitchen. rec> decr Glimep to 1/2 tab in AM; [facility] ch to Glybur2.5mg . ~  labs 5/11 on Glybur2.5- 1/2 tab showed BS= 79, A1c= 6.0 ~  labs 11/11 on Glybur2.5-1/2tab showed BS= 99, A1c= 6.4 ~  Labs 5/12 on Glybur2.5-1/2tab showed BS= 95, A1c=6.5 ~  Labs 11/12 on Glybur2.5-1/2tab showed BS= 83, A1c= 6.3 ~  1/13:  He had Eye eval DrTanner- neg, no retinopathy... ~  Labs 5/13 on Glybur2.5-1/2tab showed BS= 97, A1c= 6.2 ~  12/13: note from [place], Ophthalmology- no DM retinopathy... ~  7/14: on Glyburide2.5; last labs 6/14 at [facility] revealed BS= 97, A1c= 6.1  Hx of RENAL INSUFFICIENCY -  ~  baseline Creat = 1.8; he has hx of  renal insuffic related to ACE/ ARB therapy & improved to baseline off this Rx; followed  by [facility]- notes & labs reviewed. ~  labs 2/09 by DrShadad w/ Cr=1.6 ~  labs 9/09 by [name] showed BUN= 20, Creat= 1.65 ~  labs 11/09 here showed BUN= 12, Creat= 1.6 ~   labs 5/10 showed BUN= 14, creat= 1.2 ~  labs 11/10 showed BUN= 14, Creat= 1.1 ~  labs 5/11 showed BUN= 13, Creat= 1.2 ~  Labs 5/12 showed BUN= 12, Creat= 1.2 ~  Labs 11/12 showed BUN= 16, Creat= 1.2 ~  Labs 5/13 showed BUN=13, Creat= 1.3  ~  10/13: he had yearly f/u [name], Creat=1.2 stable, goals discussed w/ pt, note from Nephrology reviewed... ~  7/14:  followed by [name] & Creat has been stable in the 1.1-1.3 range; labs 6/14 from [place] showed BUN=21, Creat=1.5  Hx ED >> followed by Urology & he has tried Cialis- both prn & daily use;  He has Primix shots for ED but he tells me that he is not using this med... ~  12/13: he had yearly f/u DrTannenbaum> bladder neck obstruction & ED on Cialis5mg /d & PEP injections (TRIMIX)  Hx STROKE & SEIZURE DISORDER - eval by neuro/ DrSethi... he remains on ASA 81mg Bid,  AGGRENOX Bid, & KEPPRA 500mg Bid... he is stable without focal weakness, sensory changes, speech problems, etc...  ~  5/10 he denies memory problems, but in need of additional evaluation, DrSethi's note 12/09 doesn't indicate difficulty in this area. ~  11/10: continues to deny problems... ~  5/11:  states he had a recent seizure and is due for f/u w/ DrSethi soon... I am still concerned about his memory & affect. ~  11/11 & 5/12>  we do not have notes from Neuro to review... 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  MONOCLONAL GAMMOPATHY - full eval from DrShadad and latest notes reviewed... of interest the pt tells me he has been a regular blood donor and freq gave "double units"... 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 he has some free kappa light chains in the urine as well... ~  8/08:  Neg metastatic bone survey; mild compression T6, degenerative cerv spondylosis... ~  10/11:  f/u DrShadad w/ extensive labs reviewed> no evid progression, continues on observation. ~  11/12:  Now that DrShadad et al are in the Epic system his notes & labs are avail to review (yearly f/u each fall). 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   Past Surgical History  Procedure Laterality Date  . Cardiac catheterization  1987    Showed distal left circumflex 100% occluded   . Cardiac defibrillator placement  08/17/2006    Implantation of a [employer] Jude single chamber defibrillator    Outpatient Encounter Prescriptions as of 12/14/2012  Medication Sig Dispense Refill  . Cellulose (AVICEL PH 105 MICRO CELLULOSE) POWD by Does not apply route as needed. Apply to infected area as needed.      Marland Kitchen  glyBURIDE (DIABETA) 2.5 MG tablet Take one tablet by mouth daily      . levETIRAcetam (KEPPRA) 500 MG tablet Take one tablet in the morning and two tablets at night      . metoprolol tartrate (LOPRESSOR) 25 MG tablet Take 1 tab by mouth twice daily  60 tablet  3  . Multiple Vitamin (MULTIVITAMIN) tablet Take 1 tablet by mouth daily.        . naftifine (NAFTIN) 1 % cream Apply 1 application topically 2 (two) times daily. Apply to infected area between toes 2 times daily      . Rivaroxaban (XARELTO) 15 MG TABS tablet Take 15 mg by mouth daily.      . simvastatin (ZOCOR) 20 MG tablet Take one half tablet by mouth daily at bedtime       . clotrimazole-betamethasone (LOTRISONE) cream Apply to groin rash 2 times daily  30 g  1  . fluconazole (DIFLUCAN) 100 MG tablet Take 2 tablets by mouth today then one tablet by mouth until gone  8 tablet  0   No facility-administered encounter medications on file as of 12/14/2012.    Allergies   Allergen Reactions  . Atorvastatin     REACTION: pt states rash from Lipitor    Current Medications, Allergies, Past Medical History, Past Surgical History, Family History, and Social History were reviewed in [employer] record.   Review of Systems        See HPI - all other systems neg except as noted... The patient denies anorexia, fever, weight loss, weight gain, vision loss, decreased hearing, hoarseness, chest pain, syncope, dyspnea on exertion, peripheral edema, prolonged cough, headaches, hemoptysis, abdominal pain, melena, hematochezia, severe indigestion/heartburn, hematuria, incontinence, muscle weakness, suspicious skin lesions, transient blindness, difficulty walking, depression, unusual weight change, abnormal bleeding, enlarged lymph nodes, and angioedema.     Objective:   Physical Exam    WD, WN, 67 y/o BM in NAD... GENERAL:  Alert & oriented; pleasant & cooperative... HEENT:  [place]/AT, EOM-wnl, PERRLA, EACs-clear, TMs-wnl, NOSE-clear, THROAT-clear & wnl. NECK:  Supple w/ fairROM; no JVD; normal carotid impulses w/o bruits; no thyromegaly or nodules palpated; no lymphadenopathy. CHEST:  Clear to P & A; without wheezes/ rales/ or rhonchi. HEART:  regular, gr 1/6 SEM without rubs or gallops heard... ABDOMEN:  Soft & nontender; normal bowel sounds; no organomegaly or masses detected. EXT: without deformities or arthritic changes; no varicose veins/ venous insuffic/ or edema. NEURO:  CN's intact; motor testing normal; sensory testing normal; gait normal & balance OK. DERM:  Typical red, sl excoriated groin rash c/w candida...  RADIOLOGY DATA:  Reviewed in the EPIC EMR & discussed w/ the patient...  LABORATORY DATA:  Reviewed in the EPIC EMR & discussed w/ the patient...   Assessment & Plan:    \Candida groin rash>>  We discussed local care, topical Rx w/ Lortisone cream Bid & Systemic rx w/ Diflucan...    COPD>  He quit smoking >61yrs ago & is  currently asymptomatic...  HBP/ CAD/ Brugada syndrome/ PAF>  followed by [name] for Cards w/ AICD- stable, on Metoprolol25Bid & Xarelto15...  HYPERLIPIDEMIA>  On Simva20 per the [facility] + low fat diet;  F/u FLP is ok x elevTG & advised on low fat diet, get wt down...  DM>  On Glyburide monotherapy per the [facility], discussed diet, exercise, BS monitoring at home etc; he denies hypoglycemic episodes & A1c= 6.1.Marland KitchenMarland Kitchen  RENAL>  Creat= 1.5 recently, and he continues to see  [name] yearly...  Hx Stroke/ Seizure>  On Keppra & stable w/o cerebral ischemic symptoms...  MGUS>  followed by DrShadad w/ labs each Autumn...   Patient's Medications  New Prescriptions   CLOTRIMAZOLE-BETAMETHASONE (LOTRISONE) CREAM    Apply to groin rash 2 times daily   FLUCONAZOLE (DIFLUCAN) 100 MG TABLET    Take 2 tablets by mouth today then one tablet by mouth until gone  Previous Medications   CELLULOSE (AVICEL PH 105 MICRO CELLULOSE) POWD    by Does not apply route as needed. Apply to infected area as needed.   GLYBURIDE (DIABETA) 2.5 MG TABLET    Take one tablet by mouth daily   LEVETIRACETAM (KEPPRA) 500 MG TABLET    Take one tablet in the morning and two tablets at night   METOPROLOL TARTRATE (LOPRESSOR) 25 MG TABLET    Take 1 tab by mouth twice daily   MULTIPLE VITAMIN (MULTIVITAMIN) TABLET    Take 1 tablet by mouth daily.     NAFTIFINE (NAFTIN) 1 % CREAM    Apply 1 application topically 2 (two) times daily. Apply to infected area between toes 2 times daily   RIVAROXABAN (XARELTO) 15 MG TABS TABLET    Take 15 mg by mouth daily.   SIMVASTATIN (ZOCOR) 20 MG TABLET    Take one half tablet by mouth daily at bedtime   Modified Medications   No medications on file  Discontinued Medications   No medications on file

## 2012-12-14 NOTE — Telephone Encounter (Signed)
Called, spoke with pt.  He would rather been seen today for a quick check. We have added him to SN's scheduled for today at 2:30.  Pt aware.

## 2012-12-14 NOTE — Telephone Encounter (Addendum)
Per SN---  Does he want a cream called into the pharmacy?  If so can send in lotrisone cream apply bid prn.  If not then  [facility] to add pt on to the schedule this afternoon for a quick check.  thanks

## 2012-12-31 ENCOUNTER — Other Ambulatory Visit: Payer: Self-pay | Admitting: Pulmonary Disease

## 2013-01-26 ENCOUNTER — Other Ambulatory Visit (HOSPITAL_BASED_OUTPATIENT_CLINIC_OR_DEPARTMENT_OTHER): Payer: Medicare Other

## 2013-01-26 DIAGNOSIS — D472 Monoclonal gammopathy: Secondary | ICD-10-CM

## 2013-01-26 LAB — CBC WITH DIFFERENTIAL/PLATELET
Basophils Absolute: 0.1 10*3/uL (ref 0.0–0.1)
Eosinophils Absolute: 0.1 10*3/uL (ref 0.0–0.5)
HGB: 14.3 g/dL (ref 13.0–17.1)
MCV: 91.2 fL (ref 79.3–98.0)
MONO#: 0.3 10*3/uL (ref 0.1–0.9)
NEUT#: 2.9 10*3/uL (ref 1.5–6.5)
RBC: 4.73 10*6/uL (ref 4.20–5.82)
RDW: 13.1 % (ref 11.0–14.6)
WBC: 4.5 10*3/uL (ref 4.0–10.3)
lymph#: 1.1 10*3/uL (ref 0.9–3.3)

## 2013-01-26 LAB — COMPREHENSIVE METABOLIC PANEL (CC13)
Albumin: 3.8 g/dL (ref 3.5–5.0)
Alkaline Phosphatase: 57 U/L (ref 40–150)
Anion Gap: 11 mEq/L (ref 3–11)
BUN: 17.3 mg/dL (ref 7.0–26.0)
CO2: 22 mEq/L (ref 22–29)
Calcium: 9.7 mg/dL (ref 8.4–10.4)
Chloride: 106 mEq/L (ref 98–109)
Glucose: 98 mg/dl (ref 70–140)
Potassium: 4.2 mEq/L (ref 3.5–5.1)
Sodium: 139 mEq/L (ref 136–145)
Total Protein: 7.6 g/dL (ref 6.4–8.3)

## 2013-01-30 LAB — SPEP & IFE WITH QIG
Alpha-2-Globulin: 8.7 % (ref 7.1–11.8)
Beta 2: 3.7 % (ref 3.2–6.5)
Beta Globulin: 4.8 % (ref 4.7–7.2)
IgA: 48 mg/dL — ABNORMAL LOW (ref 68–379)
IgG (Immunoglobin G), Serum: 1620 mg/dL — ABNORMAL HIGH (ref 650–1600)
M-Spike, %: 1.03 g/dL
Total Protein, Serum Electrophoresis: 7.1 g/dL (ref 6.0–8.3)

## 2013-01-30 LAB — KAPPA/LAMBDA LIGHT CHAINS
Kappa:Lambda Ratio: 9.16 — ABNORMAL HIGH (ref 0.26–1.65)
Lambda Free Lght Chn: 0.99 mg/dL (ref 0.57–2.63)

## 2013-02-02 ENCOUNTER — Ambulatory Visit (HOSPITAL_BASED_OUTPATIENT_CLINIC_OR_DEPARTMENT_OTHER): Payer: Medicare Other | Admitting: Oncology

## 2013-02-02 ENCOUNTER — Telehealth: Payer: Self-pay | Admitting: Oncology

## 2013-02-02 VITALS — BP 130/84 | HR 63 | Temp 96.8°F | Resp 18 | Ht 69.0 in | Wt 202.0 lb

## 2013-02-02 DIAGNOSIS — G40909 Epilepsy, unspecified, not intractable, without status epilepticus: Secondary | ICD-10-CM

## 2013-02-02 DIAGNOSIS — D472 Monoclonal gammopathy: Secondary | ICD-10-CM

## 2013-02-02 NOTE — Progress Notes (Signed)
Hematology and Oncology Follow Up Visit  Brad Brown [id number] 03-10-1946 67 y.o. 02/02/2013 8:59 AM   Principle Diagnosis: This is a 67 year old gentleman with monoclonal gammopathy of undetermined significance.  He has an IgG kappa subtype without any evidence of end-organ damage.  Current therapy: He is under observation and surveillance.  His M-spike continued to be close to 1 g/dL.  Interim History:  Brad Brown presents today for a followup visit.  He is a pleasant gentleman with a history of a monoclonal gammopathy without any evidence to suggest end-organ damage.  He has been followed for the last 6 years and had not really developed anything to suggest multiple myeloma.  His skeletal survey was unrevealing in his IgG level continued to be close to the normal range, with an M-spike of about 1 g/dL.  Since the last time I saw him, he had not had any hospitalization, had not had any illnesses.  He continued to perform activities of daily living without any hindrance or decline. He is on on Xarelto for cardiac indications and have done relatively well with it. He had noticed some occasional bleeding gums.  Medications: I have reviewed the patient's current medications.  Current Outpatient Prescriptions  Medication Sig Dispense Refill  . Cellulose (AVICEL PH 105 MICRO CELLULOSE) POWD by Does not apply route as needed. Apply to infected area as needed.      . clotrimazole-betamethasone (LOTRISONE) cream Apply to groin rash 2 times daily  30 g  1  . fluconazole (DIFLUCAN) 100 MG tablet Take 2 tablets by mouth today then one tablet by mouth until gone  8 tablet  0  . glyBURIDE (DIABETA) 2.5 MG tablet Take one tablet by mouth daily      . levETIRAcetam (KEPPRA) 500 MG tablet Take one tablet in the morning and two tablets at night      . metoprolol tartrate (LOPRESSOR) 25 MG tablet Take 1 tab by mouth twice daily  60 tablet  3  . Multiple Vitamin (MULTIVITAMIN) tablet Take 1 tablet by  mouth daily.        . naftifine (NAFTIN) 1 % cream Apply 1 application topically 2 (two) times daily. Apply to infected area between toes 2 times daily      . Rivaroxaban (XARELTO) 20 MG TABS tablet Take 20 mg by mouth daily with supper.      . simvastatin (ZOCOR) 20 MG tablet Take one half tablet by mouth daily at bedtime        No current facility-administered medications for this visit.    Allergies:  Allergies  Allergen Reactions  . Atorvastatin     REACTION: pt states rash from Lipitor    Past Medical History, Surgical history, Social history, and Family History were reviewed and updated.  Review of Systems:  Remaining ROS negative. Physical Exam: Blood pressure 130/84, pulse 63, temperature 96.8 F (36 C), temperature source Oral, resp. rate 18, height 5\' 9"  (1.753 m), weight 202 lb (91.627 kg). ECOG: 1 General appearance: alert Head: Normocephalic, without obvious abnormality, atraumatic Neck: no adenopathy, no carotid bruit, no JVD, supple, symmetrical, trachea midline and thyroid not enlarged, symmetric, no tenderness/mass/nodules Lymph nodes: Cervical, supraclavicular, and axillary nodes normal. Heart:regular rate and rhythm, S1, S2 normal, no murmur, click, rub or gallop Lung:chest clear, no wheezing, rales, normal symmetric air entry Abdomin: soft, non-tender, without masses or organomegaly EXT:no erythema, induration, or nodules   Lab Results: Lab Results  Component Value Date   WBC 4.5 01/26/2013  HGB 14.3 01/26/2013   HCT 43.2 01/26/2013   MCV 91.2 01/26/2013   PLT 166 01/26/2013     Chemistry      Component Value Date/Time   NA 139 01/26/2013 0850   NA 133* 11/04/2012 1622   K 4.2 01/26/2013 0850   K 4.2 11/04/2012 1622   CL 102 11/04/2012 1622   CL 104 01/06/2012 1355   CO2 22 01/26/2013 0850   CO2 24 11/04/2012 1622   BUN 17.3 01/26/2013 0850   BUN 15 11/04/2012 1622   CREATININE 1.4* 01/26/2013 0850   CREATININE 1.40* 11/04/2012 1622   CREATININE 1.4  10/07/2012 1352      Component Value Date/Time   CALCIUM 9.7 01/26/2013 0850   CALCIUM 9.2 11/04/2012 1622   ALKPHOS 57 01/26/2013 0850   ALKPHOS 44 08/05/2011 1054   AST 20 01/26/2013 0850   AST 22 08/05/2011 1054   ALT 27 01/26/2013 0850   ALT 28 08/05/2011 1054   BILITOT 0.92 01/26/2013 0850   BILITOT 1.3* 08/05/2011 1054      Results for Brad Brown (MRN [id number]) as of 02/02/2013 08:52  Ref. Range 12/25/2010 13:40 01/06/2012 00:00 01/26/2013 08:50  M-SPIKE, % No range found 0.98 0.97 1.03   Results for Brad Brown (MRN [id number]) as of 02/02/2013 08:52  Ref. Range 12/25/2010 13:40 01/06/2012 00:00 01/26/2013 08:50  IgG (Immunoglobin G), Serum Latest Range: [phone] mg/dL 1540 1380 1620 (H)  Results for Brad Brown (MRN [id number]) as of 02/02/2013 08:52  Ref. Range 12/25/2010 13:40 01/06/2012 00:00 01/26/2013 08:50  Kappa:Lambda Ratio Latest Range: 0.26-1.65  7.42 (H) 20.14 (H) 9.16 (H)    Impression and Plan:  A 67 year old gentleman with the following issues:  1. IgG kappa monoclonal gammopathy of undetermined significance without any evidence of end-organ damage to suggest multiple myeloma.  Laboratory data from 01/26/2013 were reviewed today in continued to show no major changes in his M spike or his quantitative immunoglobulins. We will continue with observation, follow up on an annual basis.  Obviously if he develops any cytopenias or anything to suggest renal insufficiency, I will re-stage him with skull survey and a bone marrow biopsy. 2. Seizure disorder.  He occasionally has had seizure activities, none recently for the time being.   [facility], MD 11/13/20148:59 AM

## 2013-02-02 NOTE — Telephone Encounter (Signed)
S/w pt re appt for 02/02/14 and 02/09/14

## 2013-02-13 ENCOUNTER — Encounter: Payer: Self-pay | Admitting: Internal Medicine

## 2013-02-13 ENCOUNTER — Ambulatory Visit (INDEPENDENT_AMBULATORY_CARE_PROVIDER_SITE_OTHER): Payer: Medicare Other | Admitting: *Deleted

## 2013-02-13 DIAGNOSIS — I4891 Unspecified atrial fibrillation: Secondary | ICD-10-CM

## 2013-02-13 LAB — MDC_IDC_ENUM_SESS_TYPE_REMOTE
Battery Remaining Longevity: 29 mo
Date Time Interrogation Session: 20141124180347
HighPow Impedance: 49 Ohm
Lead Channel Impedance Value: 410 Ohm
Lead Channel Pacing Threshold Pulse Width: 0.5 ms
Lead Channel Setting Pacing Amplitude: 2.5 V
Zone Setting Detection Interval: 250 ms
Zone Setting Detection Interval: 350 ms

## 2013-02-17 ENCOUNTER — Other Ambulatory Visit: Payer: Self-pay | Admitting: Internal Medicine

## 2013-02-24 ENCOUNTER — Encounter: Payer: Self-pay | Admitting: *Deleted

## 2013-04-19 ENCOUNTER — Ambulatory Visit: Payer: Medicare Other | Admitting: Pulmonary Disease

## 2013-05-10 ENCOUNTER — Ambulatory Visit (INDEPENDENT_AMBULATORY_CARE_PROVIDER_SITE_OTHER): Payer: Medicare Other | Admitting: *Deleted

## 2013-05-10 DIAGNOSIS — I4891 Unspecified atrial fibrillation: Secondary | ICD-10-CM

## 2013-05-10 NOTE — Progress Notes (Signed)
Pt was started on Xarelto for Afib on 09/2012.    Reviewed patients medication list.  Pt is not currently on any combined P-gp and strong CYP3A4 inhibitors/inducers (ketoconazole, traconazole, ritonavir, carbamazepine, phenytoin, rifampin, St. John's wort).  Reviewed labs.  SCr 1.3, Weight 91.6Kg , CrCl- 73.9.  Dose appropriate based on CrCl.   Hgb and HCT 14.2/42.4.   A full discussion of the nature of anticoagulants has been carried out.  A benefit/risk analysis has been presented to the patient, so that they understand the justification for choosing anticoagulation with Xarelto at this time.  The need for compliance is stressed.  Pt is aware to take the medication once daily with the largest meal of the day.  Side effects of potential bleeding are discussed, including unusual colored urine or stools, coughing up blood or coffee ground emesis, nose bleeds or serious fall or head trauma.  Discussed signs and symptoms of stroke. The patient should avoid any OTC items containing aspirin or ibuprofen.  Avoid alcohol consumption.   Call if any signs of abnormal bleeding.  Discussed financial obligations and resolved any difficulty in obtaining medication.  Next lab test test in 6 months on 11/09/13.

## 2013-05-11 LAB — CBC
HCT: 42.4 % (ref 39.0–52.0)
Hemoglobin: 14.2 g/dL (ref 13.0–17.0)
MCHC: 33.4 g/dL (ref 30.0–36.0)
MCV: 92.6 fl (ref 78.0–100.0)
PLATELETS: 187 10*3/uL (ref 150.0–400.0)
RBC: 4.59 Mil/uL (ref 4.22–5.81)
RDW: 13 % (ref 11.5–14.6)
WBC: 4.8 10*3/uL (ref 4.5–10.5)

## 2013-05-11 LAB — BASIC METABOLIC PANEL
BUN: 15 mg/dL (ref 6–23)
CHLORIDE: 103 meq/L (ref 96–112)
CO2: 27 mEq/L (ref 19–32)
Calcium: 9.4 mg/dL (ref 8.4–10.5)
Creatinine, Ser: 1.3 mg/dL (ref 0.4–1.5)
GFR: 71.17 mL/min (ref >60.00)
GLUCOSE: 102 mg/dL — AB (ref 70–99)
Potassium: 4.4 mEq/L (ref 3.5–5.1)
SODIUM: 136 meq/L (ref 135–145)

## 2013-05-17 ENCOUNTER — Ambulatory Visit (INDEPENDENT_AMBULATORY_CARE_PROVIDER_SITE_OTHER): Payer: Medicare Other | Admitting: *Deleted

## 2013-05-17 ENCOUNTER — Ambulatory Visit (INDEPENDENT_AMBULATORY_CARE_PROVIDER_SITE_OTHER): Payer: Medicare Other | Admitting: Adult Health

## 2013-05-17 ENCOUNTER — Encounter: Payer: Self-pay | Admitting: Adult Health

## 2013-05-17 VITALS — BP 136/74 | HR 73 | Temp 98.3°F | Ht 69.0 in | Wt 206.2 lb

## 2013-05-17 DIAGNOSIS — I498 Other specified cardiac arrhythmias: Secondary | ICD-10-CM

## 2013-05-17 DIAGNOSIS — I4891 Unspecified atrial fibrillation: Secondary | ICD-10-CM

## 2013-05-17 DIAGNOSIS — J069 Acute upper respiratory infection, unspecified: Secondary | ICD-10-CM

## 2013-05-17 DIAGNOSIS — Q248 Other specified congenital malformations of heart: Secondary | ICD-10-CM

## 2013-05-17 LAB — MDC_IDC_ENUM_SESS_TYPE_REMOTE
Battery Remaining Longevity: 2.4
Battery Voltage: 2.56 V
HighPow Impedance: 49 Ohm
Implantable Pulse Generator Serial Number: 449377
Lead Channel Impedance Value: 440 Ohm
Lead Channel Sensing Intrinsic Amplitude: 9.2 mV
Lead Channel Setting Pacing Amplitude: 2.5 V
Lead Channel Setting Pacing Pulse Width: 0.5 ms
Lead Channel Setting Sensing Sensitivity: 0.3 mV
MDC IDC SET ZONE DETECTION INTERVAL: 250 ms
MDC IDC SET ZONE DETECTION INTERVAL: 350 ms
MDC IDC STAT BRADY RV PERCENT PACED: 1 % — AB
Zone Setting Detection Interval: 300 ms

## 2013-05-17 MED ORDER — AZITHROMYCIN 250 MG PO TABS: ORAL_TABLET | ORAL | Status: AC

## 2013-05-17 MED ORDER — HYDROCODONE-HOMATROPINE 5-1.5 MG/5ML PO SYRP: 5.0000 mL | ORAL_SOLUTION | Freq: Four times a day (QID) | ORAL | Status: DC | PRN

## 2013-05-17 NOTE — Patient Instructions (Signed)
Zpack take as directed.  Mucinex DM Twice daily  As needed  Cough/congestion  Fluids and rest  Hydromet 1 tsp every 6hr As needed  Cough, may make you sleepy.  Please contact office for sooner follow up if symptoms do not improve or worsen or seek emergency care  follow up Dr. Lenna Gilford  As planned next week.

## 2013-05-17 NOTE — Assessment & Plan Note (Signed)
Zpack take as directed.  Mucinex DM Twice daily  As needed  Cough/congestion  Fluids and rest  Hydromet 1 tsp every 6hr As needed  Cough, may make you sleepy.  Please contact office for sooner follow up if symptoms do not improve or worsen or seek emergency care  follow up Dr. Lenna Gilford  As planned next week.

## 2013-05-17 NOTE — Progress Notes (Signed)
Subjective:    Patient ID: Brad Brown, male    DOB: 06/10/1945, 68 y.o.   MRN: [id number]  HPI 68 y/o BM has mult med problems as noted & he has mult specialty physicians tending to his medical needs... he gets his meds from the [facility]...   05/17/2013 Acute OV  Complains of  prod cough with yellow/green mucus, wheezing, increased SOB, some chest tightness x2 days.  Denies any f/c/s, nausea, vomiting, hemoptysis, or chest pain. OTC not helping. Worried about the bad weather coming in tomorrow.  No recent travel or abx use.           Problem List:  COPD - exsmoker quit >5 years now "I learned my lesson"... no regular meds... denies recurrent symptoms~ denies cough, sputum, hemoptysis, worsening dyspnea, wheezing, chest pain, snoring, daytime hypersomnolence, etc...  HBP, CAD & BRUGADA Syndrome - followed by Hilary Hertz & his notes are reviewed... 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 Myoview 2/08 showed sm inferolat infarct w/o ischemia & EF=59%... hosp 5/08 w/ seizure &  EKG showing Brugada syndrome- cath w/ normal coronaries, good LVF, and defibrillator implanted... he remains on ASA, & METOPROLOL 25mg - 1/2Bid... ~  5/10: BP= 138/80 & even better at home, he says... no CP, palpit, dizzy, etc... ~  11/10: BP= 128/80 & feeling well- no CP, palpit, dizzy, etc... ~  5/11:  BP= 132/78 & feeling well- no complaints or concerns... ~  11/11:  BP= 120/80 & he is asymptomatic... ~  5/12:  BP= 132/76 & he continues asymptomatic... ~  11/12:  BP= 118/82 & he remains asymptomatic... ~  5/13:  BP= 130/80 & feeling well w/o CP, palpit, SOB, edema, etc...  HYPERLIPIDEMIA - prev w/ fair control on Antara, but [facility] switched him to SIMVASTATIN 20mg /d. ~  FLP 5/09 showed TChol 121, TG 93, HDL 36, LDL 67 ~  FLP 5/10 on Antara130 showed TChol 143, TG 140, HDL 45, LDL 70 ~  FLP 11/10 on Antara130 showed TChol 143, TG  194, HDL 46, LDL 59... same med, better diet; but [facility] to Simva20. ~  FLP 5/11 on Simva20 showed TChol 122, TG 193, HDL 41, LDL 43 ~  FLP 11/11 on Simva20 showed TChol 137, TG 228, HDL 42, LDL 50... needs better low fat diet. ~  FLP 5/12 on Simva20 showed Tchol 141, TG 192, HDL 42, LDL 61 ~  FLP 11/12 on Simva20 showed TChol 133, TG 189, HDL 45, LDL 50... Continue same & better low fat diet. 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 ~  labs 11/09 on diet showed BS= 116, A1c= 7.1.Marland Kitchen. rec> start Metformin 500mg /d. ~  labs 5/10 on Metform500 showed BS= 96, A1c= 6.1.Marland KitchenMarland Kitchen continue same. ~  [facility] changed pt to Gimepiride 2mg /d... ~  labs 11/10 on Glimep2 showed BS= 78, A1c= 5.9.Marland Kitchen. rec> decr Glimep to 1/2 tab in AM; [facility] ch to Glybur2.5mg . ~  labs 5/11 on Glybur2.5- 1/2 tab showed BS= 79, A1c= 6.0 ~  labs 11/11 on Glybur2.5-1/2tab showed BS= 99, A1c= 6.4 ~  Labs 5/12 on Glybur2.5-1/2tab showed BS= 95, A1c=6.5 ~  Labs 11/12 on Glybur2.5-1/2tab showed BS= 83, A1c= 6.3 ~  1/13:  He had Eye eval DrTanner- neg, no  retinopathy... ~  Labs 5/13 on Glybur2.5-1/2tab showed BS= 97, A1c= 6.2  Hx of RENAL INSUFFICIENCY - baseline Creat = 1.8.Marland Kitchen. he has hx of renal insuffic related to ACE/ ARB therapy & improved to baseline off this Rx... followed  by [facility]- notes & labs reviewed. ~  labs 2/09 by DrShadad w/ Cr=1.6 ~  labs 9/09 by [name] showed BUN= 20, Creat= 1.65 ~  labs 11/09 here showed BUN= 12, Creat= 1.6 ~  labs 5/10 showed BUN= 14, creat= 1.2 ~  labs 11/10 showed BUN= 14, Creat= 1.1 ~  labs 5/11 showed BUN= 13, Creat= 1.2 ~  Labs 5/12 showed BUN= 12, Creat= 1.2 ~  Labs 11/12 showed BUN= 16, Creat= 1.2 ~  Labs 5/13 showed BUN=13, Creat= 1.3   Hx STROKE & SEIZURE DISORDER - eval by neuro/ DrSethi... he remains on ASA 81mg Bid,  AGGRENOX  Bid, & KEPPRA 500mg Bid... he is stable without focal weakness, sensory changes, speech problems, etc...  ~  5/10 he denies memory problems, but in need of additional evaluation, DrSethi's note 12/09 doesn't indicate difficulty in this area. ~  11/10: continues to deny problems... ~  5/11:  states he had a recent seizure and is due for f/u w/ DrSethi soon... I am still concerned about his memory & affect. ~  11/11 & 5/12>  we do not have notes from Neuro to review... 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  MONOCLONAL GAMMOPATHY - full eval from DrShadad and latest notes reviewed... of interest the pt tells me he has been a regular blood donor and freq gave "double units"... 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 he has some free kappa light chains in the urine as well... ~  10/11:  f/u DrShadad w/ extensive labs reviewed> no evid progression, continues on observation. ~  11/12:  Now that DrShadad et al are in the Epic system his notes & labs are avail to review (yearly f/u each fall).   Past Surgical History  Procedure Laterality Date  . Cardiac catheterization  1987    Showed distal left circumflex 100% occluded   . Cardiac defibrillator placement  08/17/2006    Implantation of a St[employer] Jude single chamber defibrillator    Outpatient Encounter Prescriptions as of 05/17/2013  Medication Sig  . Cellulose (AVICEL PH 105 MICRO CELLULOSE) POWD by Does not apply route as needed. Apply to infected area as needed.  . clotrimazole-betamethasone (LOTRISONE) cream Apply to groin rash 2 times daily  . glyBURIDE (DIABETA) 2.5 MG  tablet Take one tablet by mouth daily  . levETIRAcetam (KEPPRA) 500 MG tablet Take one tablet in the morning and two tablets at night  . metoprolol tartrate (LOPRESSOR) 25 MG tablet Take 1 tab by mouth twice daily  . Multiple Vitamin (MULTIVITAMIN) tablet Take 1 tablet by mouth daily.    . naftifine (NAFTIN) 1 % cream Apply to infected area between toes 2 times daily as needed  . Rivaroxaban (XARELTO) 20 MG TABS tablet Take 20 mg by mouth daily with supper.  . simvastatin (ZOCOR) 20 MG tablet Take one half  tablet by mouth daily at bedtime   . azithromycin (ZITHROMAX Z-PAK) 250 MG tablet Take 2 tablets (500 mg) on  Day 1,  followed by 1 tablet (250 mg) once daily on Days 2 through 5.  . HYDROcodone-homatropine (HYDROMET) 5-1.5 MG/5ML syrup Take 5 mLs by mouth every 6 (six) hours as needed for cough.  . [DISCONTINUED] fluconazole (DIFLUCAN) 100 MG tablet Take 2 tablets by mouth today then one tablet by mouth until gone    Allergies  Allergen Reactions  . Atorvastatin     REACTION: pt states rash from Lipitor    Current Medications, Allergies, Past Medical History, Past Surgical History, Family History, and Social History were reviewed in [employer] record.   Review of Systems       Constitutional:   No  weight loss, night sweats,  Fevers, chills, fatigue, or  lassitude.  HEENT:   No headaches,  Difficulty swallowing,  Tooth/dental problems, or  Sore throat,                No sneezing, itching, ear ache, + nasal congestion, post nasal drip,   CV:  No chest pain,  Orthopnea, PND, swelling in lower extremities, anasarca, dizziness, palpitations, syncope.   GI  No heartburn, indigestion, abdominal pain, nausea, vomiting, diarrhea, change in bowel habits, loss of appetite, bloody stools.   Resp:      No chest wall deformity  Skin: no rash or lesions.  GU: no dysuria, change in color of urine, no urgency or frequency.  No flank pain, no hematuria   MS:  No joint  pain or swelling.  No decreased range of motion.  No back pain.  Psych:  No change in mood or affect. No depression or anxiety.  No memory loss.       Objective:   Physical Exam    WD, WN, 68 y/o BM in NAD... GENERAL:  Alert & oriented; pleasant & cooperative... HEENT:  [place]/AT,  EACs-clear, TMs-wnl, NOSE-clear, THROAT-clear & wnl. NECK:  Supple w/ fairROM; no JVD; normal carotid impulses w/o bruits; no thyromegaly or nodules palpated; no lymphadenopathy CHEST:  Coarse BS  without wheezes/ rales/ or rhonchi. HEART:  regular, gr 1/6 SEM without rubs or gallops heard... ABDOMEN:  Soft & nontender; normal bowel sounds; no organomegaly or masses detected. EXT: without deformities or arthritic changes; no varicose veins/ venous insuffic/ or edema. NEURO:     ; gait normal & balance OK. DERM:  No lesions noted; no rash etc...    Assessment & Plan:

## 2013-05-22 ENCOUNTER — Emergency Department (HOSPITAL_COMMUNITY)
Admission: EM | Admit: 2013-05-22 | Discharge: 2013-05-22 | Disposition: A | Discharge status: Home / Self Care | Payer: Medicare Other | Source: Home / Self Care | Attending: Family Medicine | Admitting: Family Medicine

## 2013-05-22 ENCOUNTER — Encounter (HOSPITAL_COMMUNITY): Payer: Self-pay | Admitting: Emergency Medicine

## 2013-05-22 ENCOUNTER — Emergency Department (INDEPENDENT_AMBULATORY_CARE_PROVIDER_SITE_OTHER): Payer: Medicare Other

## 2013-05-22 DIAGNOSIS — S63509A Unspecified sprain of unspecified wrist, initial encounter: Secondary | ICD-10-CM

## 2013-05-22 DIAGNOSIS — S63502A Unspecified sprain of left wrist, initial encounter: Secondary | ICD-10-CM

## 2013-05-22 NOTE — ED Provider Notes (Signed)
CSN: [id number]     Arrival date & time 05/22/13  1315 History   First MD Initiated Contact with Patient 05/22/13 1424     Chief Complaint  Patient presents with  . Fall   (Consider location/radiation/quality/duration/timing/severity/associated sxs/prior Treatment) Patient is a 68 y.o. male presenting with fall. The history is provided by the patient.  Fall This is a new problem. The current episode started 12 to 24 hours ago (slipped down stairs at home and landed on left wrist breaking fall, no other injuries.). The problem has been gradually worsening. Associated symptoms comments: Left wrist pain and swelling.    Past Medical History  Diagnosis Date  . COPD (chronic obstructive pulmonary disease)   . CAD (coronary artery disease)   . 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 Automatic implantable cardiac defibrillator in situ   . Hyperlipidemia   . Borderline diabetes mellitus   . GERD (gastroesophageal reflux disease)   . Colonic polyp   . History of renal insufficiency syndrome   . Stroke   . Seizure disorder   . Monoclonal gammopathy    Past Surgical History  Procedure Laterality Date  . Cardiac catheterization  1987    Showed distal left circumflex 100% occluded   . Cardiac defibrillator placement  08/17/2006    Implantation of a St. Jude single chamber defibrillator   Family History  Problem Relation Age of Onset  . Heart attack Brother    History  Substance Use Topics  . Smoking status: Former Smoker -- 0.50 packs/day for 15 years    Types: Cigarettes    Quit date: 03/23/2004  . Smokeless tobacco: Never Used  . Alcohol Use: No     Comment: quit 2-3 years ago    Review of Systems  Constitutional: Negative.   HENT: Negative.   Cardiovascular: Negative.   Musculoskeletal: Positive for joint swelling. Negative for back pain, gait problem and neck pain.  Skin: Negative.   Neurological: Negative.     Allergies  Atorvastatin  Home  Medications   Current Outpatient Rx  Name  Route  Sig  Dispense  Refill  . azithromycin (ZITHROMAX Z-PAK) 250 MG tablet      Take 2 tablets (500 mg) on  Day 1,  followed by 1 tablet (250 mg) once daily on Days 2 through 5.   6 each   0   . Cellulose (AVICEL PH 105 MICRO CELLULOSE) POWD   Does not apply   by Does not apply route as needed. Apply to infected area as needed.         . clotrimazole-betamethasone (LOTRISONE) cream      Apply to groin rash 2 times daily   30 g   1   . glyBURIDE (DIABETA) 2.5 MG tablet      Take one tablet by mouth daily         . HYDROcodone-homatropine (HYDROMET) 5-1.5 MG/5ML syrup   Oral   Take 5 mLs by mouth every 6 (six) hours as needed for cough.   240 mL   0   . levETIRAcetam (KEPPRA) 500 MG tablet      Take one tablet in the morning and two tablets at night         . metoprolol tartrate (LOPRESSOR) 25 MG tablet      Take 1 tab by mouth twice daily   60 tablet   3   . Multiple Vitamin (MULTIVITAMIN) tablet   Oral   Take 1 tablet  by mouth daily.           . naftifine (NAFTIN) 1 % cream      Apply to infected area between toes 2 times daily as needed         . Rivaroxaban (XARELTO) 20 MG TABS tablet   Oral   Take 20 mg by mouth daily with supper.         . simvastatin (ZOCOR) 20 MG tablet      Take one half tablet by mouth daily at bedtime           BP 162/94  Pulse 79  Temp(Src) 98.7 F (37.1 C) (Oral)  Resp 16  SpO2 99% Physical Exam  Nursing note and vitals reviewed. Constitutional: He is oriented to person, place, and time. He appears well-developed and well-nourished.  Neck: Normal range of motion. Neck supple.  Musculoskeletal: He exhibits tenderness.       Left wrist: He exhibits decreased range of motion, tenderness, bony tenderness, swelling and deformity.  Neurological: He is alert and oriented to person, place, and time.  Skin: Skin is warm and dry.    ED Course  Procedures (including  critical care time) Labs Review Labs Reviewed - No data to display Imaging Review Dg Wrist Complete Left  05/22/2013   CLINICAL DATA:  [place] yesterday can hurt wrist  EXAM: LEFT WRIST - COMPLETE 3+ VIEW  COMPARISON:  None.  FINDINGS: There is no evidence of fracture or dislocation. There is no evidence of arthropathy or other focal bone abnormality. Soft tissues are unremarkable.  IMPRESSION: Negative.   Electronically Signed   By: Skipper Cliche M.D.   On: 05/22/2013 14:46   X-rays reviewed and report per radiologist.   MDM   1. Sprain of wrist, left       Billy Fischer, MD 05/22/13 [phone]

## 2013-05-22 NOTE — Discharge Instructions (Signed)
Wear splint for comfort and swelling as needed, ice today then warm soak for swelling.

## 2013-05-22 NOTE — ED Notes (Signed)
Reportedly fell yesterday, pain left wrist

## 2013-05-26 ENCOUNTER — Encounter: Payer: Self-pay | Admitting: Internal Medicine

## 2013-05-29 ENCOUNTER — Encounter: Payer: Self-pay | Admitting: Pulmonary Disease

## 2013-06-08 ENCOUNTER — Encounter: Payer: Self-pay | Admitting: *Deleted

## 2013-06-12 ENCOUNTER — Encounter: Payer: Self-pay | Admitting: *Deleted

## 2013-06-14 ENCOUNTER — Encounter: Payer: Self-pay | Admitting: Pulmonary Disease

## 2013-06-14 ENCOUNTER — Ambulatory Visit (INDEPENDENT_AMBULATORY_CARE_PROVIDER_SITE_OTHER): Payer: Medicare Other | Admitting: Pulmonary Disease

## 2013-06-14 ENCOUNTER — Other Ambulatory Visit (INDEPENDENT_AMBULATORY_CARE_PROVIDER_SITE_OTHER): Payer: Medicare Other

## 2013-06-14 ENCOUNTER — Telehealth: Payer: Self-pay | Admitting: Pulmonary Disease

## 2013-06-14 VITALS — BP 138/94 | HR 60 | Temp 96.7°F | Ht 69.0 in | Wt 204.0 lb

## 2013-06-14 DIAGNOSIS — N259 Disorder resulting from impaired renal tubular function, unspecified: Secondary | ICD-10-CM

## 2013-06-14 DIAGNOSIS — D472 Monoclonal gammopathy: Secondary | ICD-10-CM

## 2013-06-14 DIAGNOSIS — I2510 Atherosclerotic heart disease of native coronary artery without angina pectoris: Secondary | ICD-10-CM

## 2013-06-14 DIAGNOSIS — I10 Essential (primary) hypertension: Secondary | ICD-10-CM

## 2013-06-14 DIAGNOSIS — E785 Hyperlipidemia, unspecified: Secondary | ICD-10-CM

## 2013-06-14 DIAGNOSIS — I498 Other specified cardiac arrhythmias: Secondary | ICD-10-CM

## 2013-06-14 DIAGNOSIS — R7309 Other abnormal glucose: Secondary | ICD-10-CM

## 2013-06-14 DIAGNOSIS — R569 Unspecified convulsions: Secondary | ICD-10-CM

## 2013-06-14 DIAGNOSIS — N320 Bladder-neck obstruction: Secondary | ICD-10-CM

## 2013-06-14 DIAGNOSIS — Q248 Other specified congenital malformations of heart: Secondary | ICD-10-CM

## 2013-06-14 DIAGNOSIS — Z95810 Presence of automatic (implantable) cardiac defibrillator: Secondary | ICD-10-CM

## 2013-06-14 DIAGNOSIS — I6350 Cerebral infarction due to unspecified occlusion or stenosis of unspecified cerebral artery: Secondary | ICD-10-CM

## 2013-06-14 DIAGNOSIS — I4891 Unspecified atrial fibrillation: Secondary | ICD-10-CM

## 2013-06-14 LAB — LIPID PANEL
CHOLESTEROL: 159 mg/dL (ref 0–200)
HDL: 47.2 mg/dL (ref >39.00)
LDL Cholesterol: 57 mg/dL (ref 0–99)
TRIGLYCERIDES: 273 mg/dL — AB (ref 0.0–149.0)
Total CHOL/HDL Ratio: 3
VLDL: 54.6 mg/dL — ABNORMAL HIGH (ref 0.0–40.0)

## 2013-06-14 LAB — TSH: TSH: 1.92 u[IU]/mL (ref 0.35–5.50)

## 2013-06-14 LAB — BASIC METABOLIC PANEL
BUN: 13 mg/dL (ref 6–23)
CALCIUM: 9.6 mg/dL (ref 8.4–10.5)
CO2: 24 meq/L (ref 19–32)
Chloride: 104 mEq/L (ref 96–112)
Creatinine, Ser: 1.4 mg/dL (ref 0.4–1.5)
GFR: 65.32 mL/min (ref >60.00)
GLUCOSE: 104 mg/dL — AB (ref 70–99)
Potassium: 4.3 mEq/L (ref 3.5–5.1)
SODIUM: 136 meq/L (ref 135–145)

## 2013-06-14 LAB — PSA: PSA: 0.68 ng/mL (ref 0.10–4.00)

## 2013-06-14 LAB — HEMOGLOBIN A1C: Hgb A1c MFr Bld: 6.9 % — ABNORMAL HIGH (ref 4.6–6.5)

## 2013-06-14 MED ORDER — MELOXICAM 7.5 MG PO TABS: 7.5000 mg | ORAL_TABLET | Freq: Every day | ORAL | Status: DC

## 2013-06-14 NOTE — Telephone Encounter (Signed)
Duplicate encounter

## 2013-06-14 NOTE — Patient Instructions (Signed)
Today we updated your med list in our EPIC system...    Continue your current medications the same...  We decided to add an arhtritis med> MELOXICAM 7.5mg  take one tab daily as needed for arthritis pain...  Today we did your follow up fasting blood work...    We will contact you w/ the results when available...   We will arrange for a referral to our GI Dept to see about a colonoscopy...  Call for any questions or if we can be of service in any way.Marland KitchenMarland Kitchen

## 2013-06-14 NOTE — Progress Notes (Signed)
Subjective:    Patient ID: Brad Brown, male    DOB: July 15, 1945, 68 y.o.   MRN: [id number]  HPI 68 y/o BM here for a follow up visit... he has mult med problems as noted & he has mult specialty physicians tending to his medical needs... he gets his meds from the [facility]...  ~  Aug 05, 2011:  78mo ROV & Brad Brown says he is doing well, good 48mo interval w/o new complaints or concerns;  He gets all his meds filled at the [place] but unfortuately he never remembers to bring the meds or list to his office visits "I was in a hurry- watching [place]"; he thinks the [place] changed his Aggrenox but he can't say to what?  We reviewed his prob list, our med list, xrays & labs> see below>>    He had Cards f/u 11/12 by [name]> HBP, ?CAD, Brugada syndrome, defibrillator- cath 2008 showed normal C's & renal doppler's were wnl; Stable, no changes made...  LABS 5/13:  FLP- all parameters at goals on Simva20;  Chems- wnl w/ BS=97 A1c=6.2 on Diabeta;  CBC- wnl;  TSH=2.00...  ~  March 14, 2012:  69mo ROV & Brad Brown reports doing satis, no new complaints or concerns...  We reviewed the following medical problems during today's office visit>>  NOTE: he is getting regular general medical follow up at the [facility] & they provide all his meds (the [facility] [address] [facility]'t [facility] a [facility] in 28yr)>>    COPD> he had a mild exac10/13 treated by TP w/ ZPak, Depo80, Mucinex, Saline & resolved...    HBP> on Metop25-1/2Bid; BP=130/82 & he denies CP, palpit, dizzy, syncope, SOB, edema, etc...    CAD, Brugada syndrome> on Plavix75 now per the [place]; followed by [name] & [name]- their notes are reviewed...    Chol> on Simva20-1/2 tab; last FLP 5/13 showed TChol 112, TG 144, HDL 45, LDL 38    DM> on Glyburide2.5; last labs 5/13 revealed BS= 97, A1c= 6.2    Renal Insuffic> followed by [name] & Creat has been stable in the 1/1-1.3 range...    Hx Stroke> off Aggrenox per the [facility], they substituted PLAVIX75; he denies any  cerebral ischemic symptoms...    Hx Seizure> on Keppra500Bid; doing well w/o recurrent seizures on the med...    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 We reviewed prob list, meds, xrays and labs> see below for updates >>   LABS 10/13 by DrShadad> reviewed in EPIC...  LABS 9/13 by [name]> scanned into EPIC...   ~  October 17, 2012:  48mo ROV & Brad Brown had an AICD shock 3/14- seen by [name] w/ interrogation revealing inapprop shock for rapid AFib=> cardioverted; I note he has not had a 2DEcho in many yrs; EP felt he needed anticoag instead of antiplatlet Rx- initially started on Xarelto, then changed to Coumadin, now back on Xarelto apparently because the [place] has just started covering for this med... They also incr his Metoprolol to 25mg  Bid, he is maintaining NSR... He last saw [name] in 2012 & was told to f/u as needed... We reviewed the following medical problems during today's office visit >>     COPD> he had a mild exac10/13 treated by TP w/ ZPak, Depo80, Mucinex, Saline & resolved; no prob since then...    HBP> on Metop25Bid; BP=142/78 & he denies CP, palpit, dizzy, syncope, SOB, edema, etc...    CAD, Brugada syndrome, new PAF> off Plavix75 now &  on XARELTO15- see above; followed by [name]- his notes are reviewed...    Chol> on Simva40-1/2 tab; last FLP 6/14 at [facility] showed TChol 152, TG 274, HDL 44, LDL 53; needs better low fat diet...    DM> on Glyburide2.5; last labs 6/14 at [facility] revealed BS= 97, A1c= 6.1    Renal Insuffic> followed by [name] & Creat has been stable in the 1.1-1.3 range; labs 6/14 from [place] showed BUN=21, Creat=1.5    Hx Stroke> off Aggrenox & on XARELTO15 now; he denies any cerebral ischemic symptoms...    Hx Seizure> on Keppra500Tid; doing well w/o recurrent seizures on the med...    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 We reviewed prob list, meds, xrays and labs> see below for updates >>   LABS 6/14 from  [facility] scanned into EPIC...    ~  December 14, 2012:  Pt called today requesting OV to check groin rash> states it's been present for 3-4d rash, raw, irritated & pruritic, in the intertrig folds & he's tried a salve he got "at the dollar store";  Exam shows characteristic rash of cutaneous candida & is sl excoriated etc;  We discussed Rx w/ Diflucan orally & Lotrisone cream topically, +local care/ keep skin off skin/ etc... If not resolved we will send him to [facility]...    He notes that his breathing is good, BP=136/84, no CP/ palpit/ SOB/ edema, DM control has been good on meds, etc;  He is reminded to drink lots of water...  ~  June 14, 2013:  107mo ROV & Brad Brown notes pain/arthritis in hands, hot soaks help some, wants med- try Mobic7.5mg  prn...  He had a URI 2/15 & saw TP> treated w/ ZPak & Hydromet w/ improvement... We reviewed the following medical problems during today's office visit >>     COPD> recent exac 2/15 treated w/ ZPak, Mucinex & Hydromet- resolved; no prob since then states breathing is ok- denies cough, sput, dyspnea, CP, etc...    HBP> on Metop25Bid; BP=138/94 & he denies CP, palpit, dizzy, syncope, SOB, edema, etc; wt is up to 204 & reminded about diet, exercise & wt reduction...    CAD, Brugada syndrome, new PAF> on XARELTO20- see above; followed by [name]- his notes are reviewed...    Chol> on Simva40-1/2 tab; last FLP 3/15 here showed TChol 159, TG 273, HDL 47, LDL 57; needs better low fat diet & wt reduction...    DM> on Glyburide2.5; labs 3/15 here revealed BS= 104, A1c= 6.9 and we reviewed diet, exercise 7 wt reduction needed...    Renal Insuffic> followed by [name] & Creat has been stable in the 1.1-1.3 range (Stage2); labs 3/15 here showed BUN=13, Creat=1.4    GU> followed by DrTannenbaum on Trimix injections and Cialis; he knows to be very careful w/ shots in light of his Xarelto...    Hx Stroke> on [place]; he denies any cerebral ischemic symptoms...    Hx Seizure> on  Keppra500Tid; doing well w/o recurrent seizures on the med...    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 We reviewed prob list, meds, xrays and labs> see below for updates >>   LABS 3/15:  FLP- ok x TG=273 on Simva20;  Chems- ok x BS=104, A1c=6.9, Cr=1.4 on Glybur2.5;  TSH=1.92;  PSA=0.68           Problem List:  COPD - exsmoker quit >5 years now "I learned my lesson"...  no regular meds... denies recurrent symptoms~ denies cough, sputum, hemoptysis, worsening dyspnea, wheezing, chest pain, snoring, daytime hypersomnolence, etc... ~  CXR 5/11 showed borderline cardiomeg, sl hyperinflation, clear lungs, pacer w/ AICD lead w/o change... ~  He had a mild exac10/13 treated by TP w/ ZPak, Depo80, Mucinex, Saline & resolved; no prob since then... ~  He had another mild exac 2/15 treated w/ ZPak, Mucinex, Hydromet & improved...  HBP, CAD & BRUGADA Syndrome, new onset PAF 3/14 per [name] >> ~  Followed by Hilary Hertz & his notes are reviewed... 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 Myoview 2/08 showed sm inferolat infarct w/o ischemia & EF=59%... hosp 5/08 w/ seizure &  EKG showing Brugada syndrome- cath w/ normal coronaries, good LVF, and defibrillator implanted... he remains on ASA, & METOPROLOL 25mg - 1/2Bid... ~  5/10: BP= 138/80 & even better at home, he says... no CP, palpit, dizzy, etc... ~  11/10: BP= 128/80 & feeling well- no CP, palpit, dizzy, etc... ~  5/11:  BP= 132/78 & feeling well- no complaints or concerns... ~  11/11:  BP= 120/80 & he is asymptomatic... ~  5/12:  BP= 132/76 & he continues asymptomatic... ~  11/12:  BP= 118/82 & he remains asymptomatic... ~  EKG 11/12 showed SBrady, rate59, IVCD/ pseudo-RBBB, otherw neg EKG... ~  5/13:  BP= 130/80 & feeling well w/o CP, palpit, SOB, edema, etc... ~  9/13:  He had f/u [name]- followed for non-ischemic  cardiomyop, chr sys heart failure, HBP, etc; doing well w/ ICD- no discharges, stable, rec to continue follow up... ~  3/14:  Brad Brown had an AICD shock 3/14- seen by [name] w/ interrogation revealing inapprop shock for rapid AFib=> cardioverted; EP felt he needed anticoag instead of antiplatlet Rx- initially started on Xarelto, then changed to Coumadin, now back on Xarelto apparently because the [place] has just started covering for this med... They also incr his Metoprolol to 25mg  Bid, he is maintaining NSR... He last saw [name] in 2012 & was told to f/u as needed; I note he has not had a 2DEcho in many yrs... ~  7/14:  BP= 142/78 & holding NSR on Metop25Bid & Xarelto15; he denies CP, notes occas palpit, denies SOB, edema, etc... ~  3/15:  on Metop25Bid & XARELTO20- BP=138/94 but better at home he says, followed by [name]- his notes are reviewed...  HYPERLIPIDEMIA - prev w/ fair control on Antara, but [facility] switched him to SIMVASTATIN 20mg /d. ~  FLP 5/09 showed TChol 121, TG 93, HDL 36, LDL 67 ~  FLP 5/10 on Antara130 showed TChol 143, TG 140, HDL 45, LDL 70 ~  FLP 11/10 on Antara130 showed TChol 143, TG 194, HDL 46, LDL 59... same med, better diet; but [facility] ch to Simva20. ~  FLP 5/11 on Simva20 showed TChol 122, TG 193, HDL 41, LDL 43 ~  FLP 11/11 on Simva20 showed TChol 137, TG 228, HDL 42, LDL 50... needs better low fat diet. ~  FLP 5/12 on Simva20 showed Tchol 141, TG 192, HDL 42, LDL 61 ~  FLP 11/12 on Simva20 showed TChol 133, TG 189, HDL 45, LDL 50... Continue same & better low fat diet. ~  FLP 5/13 on Simva20 showed TChol 112, TG 144, HDL 45, LDL 38 ~  FLP 6/14 at the [place] on Simva20 showed TChol 152, TG 274, HDL 44, LDL 53; needs better low fat diet.  ~  FLP 3/15 on Simva20 showed TChol 159, TG 273, HDL 47, LDL 57; needs better low fat diet & wt reduction.   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 ~  labs 11/09 on diet showed BS= 116, A1c= 7.1.Marland Kitchen. rec> start Metformin 500mg /d. ~  labs 5/10 on Metform500 showed BS= 96, A1c= 6.1.Marland KitchenMarland Kitchen continue same. ~  [facility] changed pt to Gimepiride 2mg /d... ~  labs 11/10 on Glimep2 showed BS= 78, A1c= 5.9.Marland Kitchen. rec> decr Glimep to 1/2 tab in AM; [facility] ch to Glybur2.5mg . ~  labs 5/11 on Glybur2.5- 1/2 tab showed BS= 79, A1c= 6.0 ~  labs 11/11 on Glybur2.5-1/2tab showed BS= 99, A1c= 6.4 ~  Labs 5/12 on Glybur2.5-1/2tab showed BS= 95, A1c=6.5 ~  Labs 11/12 on Glybur2.5-1/2tab showed BS= 83, A1c= 6.3 ~  1/13:  He had Eye eval DrTanner- neg, no retinopathy... ~  Labs 5/13 on Glybur2.5-1/2tab showed BS= 97, A1c= 6.2 ~  12/13: note from [place], Ophthalmology- no DM retinopathy... ~  7/14: on Glyburide2.5; last labs 6/14 at [facility] revealed BS= 97, A1c= 6.1 ~  12/14: note from [place], Ophthalmology- no DM retinopathy... ~  3/15: on Glyburide2.5; labs 3/15 here revealed BS= 104, A1c= 6.9 and we reviewed diet, exercise 7 wt reduction needed...  Hx of RENAL INSUFFICIENCY -  ~  baseline Creat = 1.8; he has hx of renal insuffic related to ACE/ ARB therapy & improved to baseline off this Rx; followed  by [facility]- notes & labs reviewed. ~  labs 2/09 by DrShadad w/ Cr=1.6 ~  labs 9/09 by [name] showed BUN= 20, Creat= 1.65 ~  labs 11/09 here showed BUN= 12, Creat= 1.6 ~  labs 5/10 showed BUN= 14, creat= 1.2 ~  labs 11/10 showed BUN= 14, Creat= 1.1 ~  labs 5/11 showed BUN= 13, Creat= 1.2 ~  Labs 5/12 showed BUN= 12, Creat= 1.2 ~  Labs 11/12 showed BUN= 16, Creat= 1.2 ~  Labs 5/13 showed BUN=13, Creat= 1.3  ~  10/13: he had yearly f/u [name], Creat=1.2 stable, goals discussed w/ pt, note from Nephrology reviewed... ~  7/14:  followed by [name] & Creat has been stable in the 1.1-1.3 range; labs 6/14 from [place] showed BUN=21, Creat=1.5 ~  11/14: he had f/u [name] & his note is reviewed... ~   followed by [name] & Creat has been stable in the  1.1-1.3 range (Stage2); labs 3/15 here showed BUN=13, Creat=1.4  Hx BPH & Hx ED >> followed by Urology & he has tried Cialis- both prn & daily use;  He has Primix shots for ED but he tells me that he is not using this med... ~  12/13: he had yearly f/u DrTannenbaum> bladder neck obstruction & ED on Cialis5mg /d & PEP injections (TRIMIX) ~  12/14: he had f/u DrTannenbaum> pt injects TRIMIX & takes Cialis for ED, told to be very careful in light of his Xarelto,  ~  Labs here 3/15 showed PSA= 0.68  Hx STROKE & SEIZURE DISORDER - eval by neuro/ DrSethi...  ~  on ASA 81mg Bid,  AGGRENOX Bid, & KEPPRA 500mg Bid... he is stable without focal weakness, sensory changes, speech problems, etc...  ~  5/10 he denies memory problems, but in need of additional evaluation, DrSethi's note 12/09 doesn't indicate difficulty in this area. ~  11/10: continues to deny problems... ~  5/11:  states he had a recent seizure and is due for f/u w/ DrSethi soon.Marland KitchenMarland Kitchen  I am still concerned about his memory & affect. ~  11/11 & 5/12>  we do not have notes from Neuro to review... 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 ~  5/13: he gets all his meds filled at the [place] but didn't bring bottles or list; he thinks they changed Aggrenox to something else (?Plavix); reminded to bring all meds to every doctor visit... ~  3/14: [place]rick had an AICD shock 3/14- seen by [name] w/ interrogation revealing inapprop shock for rapid AFib=> cardioverted; EP felt he needed anticoag instead of antiplatlet Rx- initially started on Xarelto, then changed to Coumadin, now back on Xarelto apparently because the [place] has just started covering for this med.  MONOCLONAL GAMMOPATHY - full eval from DrShadad and latest notes reviewed... of interest the pt tells me he has  been a regular blood donor and freq gave "double units"... 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 he has some free kappa light chains in the urine as well... ~  8/08:  Neg metastatic bone survey; mild compression T6, degenerative cerv spondylosis... ~  10/11:  f/u DrShadad w/ extensive labs reviewed> no evid progression, continues on observation. ~  11/12:  Now that DrShadad et al are in the Epic system his notes & labs are avail to review (yearly f/u each fall). 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 ~  11/14: he had f/u DrShadad> MGUS, IgG kappa, on surveillance & stable w/o signif change in his labs (reviewed in Epic)...   Past Surgical History  Procedure Laterality Date  . Cardiac catheterization  1987    Showed distal left circumflex 100% occluded   . Cardiac defibrillator placement  08/17/2006    Implantation of a [employer] single chamber defibrillator    Outpatient Encounter Prescriptions as of 06/14/2013  Medication Sig  . Cellulose (AVICEL PH 105 MICRO CELLULOSE) POWD by Does not apply route as needed. Apply to infected area as needed.  . clotrimazole-betamethasone (LOTRISONE) cream Apply to groin rash 2 times daily  . glyBURIDE (DIABETA) 2.5 MG tablet Take one tablet by mouth daily  . HYDROcodone-homatropine (HYDROMET) 5-1.5 MG/5ML syrup Take 5 mLs by mouth every 6 (six) hours as needed for cough.  . levETIRAcetam (KEPPRA) 500 MG tablet Take one tablet in the morning and two tablets at night  . metoprolol tartrate (LOPRESSOR) 25 MG tablet Take 1 tab by mouth twice daily  . Multiple Vitamin (MULTIVITAMIN) tablet Take 1 tablet by mouth daily.    . naftifine (NAFTIN) 1 % cream Apply to infected area  between toes 2 times daily as needed  . Rivaroxaban (XARELTO) 20 MG TABS tablet Take 20 mg by mouth daily with supper.  . simvastatin (ZOCOR) 20 MG tablet Take one half tablet by mouth daily at bedtime     Allergies  Allergen Reactions  . Atorvastatin     REACTION: pt states rash from Lipitor    Current Medications, Allergies, Past Medical History, Past Surgical History, Family History,  and Social History were reviewed in [employer] record.   Review of Systems        See HPI - all other systems neg except as noted... The patient denies anorexia, fever, weight loss, weight gain, vision loss, decreased hearing, hoarseness, chest pain, syncope, dyspnea on exertion, peripheral edema, prolonged cough, headaches, hemoptysis, abdominal pain, melena, hematochezia, severe indigestion/heartburn, hematuria, incontinence, muscle weakness, suspicious skin lesions, transient blindness, difficulty walking, depression, unusual weight change, abnormal bleeding, enlarged lymph nodes, and angioedema.     Objective:   Physical Exam    WD, WN, 68 y/o BM in NAD... GENERAL:  Alert & oriented; pleasant & cooperative... HEENT:  [place]/AT, EOM-wnl, PERRLA, EACs-clear, TMs-wnl, NOSE-clear, THROAT-clear & wnl. NECK:  Supple w/ fairROM; no JVD; normal carotid impulses w/o bruits; no thyromegaly or nodules palpated; no lymphadenopathy. CHEST:  Clear to P & A; without wheezes/ rales/ or rhonchi. HEART:  regular, gr 1/6 SEM without rubs or gallops heard... ABDOMEN:  Soft & nontender; normal bowel sounds; no organomegaly or masses detected. EXT: without deformities or arthritic changes; no varicose veins/ venous insuffic/ or edema. NEURO:  CN's intact; motor testing normal; sensory testing normal; gait normal & balance OK. DERM:  Typical red, sl excoriated groin rash c/w candida...  RADIOLOGY DATA:  Reviewed in the EPIC EMR & discussed w/ the patient...  LABORATORY DATA:  Reviewed in the EPIC  EMR & discussed w/ the patient...   Assessment & Plan:    COPD>  He quit smoking >71yrs ago & is currently asymptomatic...  HBP/ CAD/ Brugada syndrome/ PAF>  followed by [name] for Cards w/ AICD- stable, on Metoprolol25Bid & Xarelto20...  HYPERLIPIDEMIA>  On Simva20 per the [facility] + low fat diet;  F/u FLP is ok x elevTG and we reviewed low fat diet & need for wt loss...  DM>  On Glyburide monotherapy per the [facility], discussed diet, exercise, BS monitoring at home etc; he denies hypoglycemic episodes & A1c= 6.9.Marland KitchenMarland Kitchen  RENAL>  Creat= 1.4 recently, and he continues to see [name] yearly...  Hx Stroke/ Seizure>  On Keppra & stable w/o cerebral ischemic symptoms...  MGUS>  followed by DrShadad w/ labs each Autumn.Marland KitchenMarland Kitchen

## 2013-06-14 NOTE — Telephone Encounter (Signed)
Brad Brown x 1 for the pt  Pt was seen by SN earlier today and given mobic--meloxicam to take for the arthritis pain that he c/o about today.  Will need to explain this to the pt when he calls back.

## 2013-06-14 NOTE — Telephone Encounter (Signed)
Pt called back and he is aware that the meloxicam is ok for him to use for the arthritis pain.  Nothing further is needed.

## 2013-06-15 ENCOUNTER — Telehealth: Payer: Self-pay | Admitting: Pulmonary Disease

## 2013-06-15 NOTE — Telephone Encounter (Signed)
Pt calling back in regards to GI Referral [id number] - [facility] REFERRAL TO GASTROENTEROLOGY  Please advise [place], pt is returning call. Thanks.

## 2013-06-16 NOTE — Telephone Encounter (Signed)
Pt is returning Libby's call & can be reached at [phone]. Marland Kitchen Me

## 2013-06-16 NOTE — Telephone Encounter (Signed)
LMTCB Sally E Ottinger ° °

## 2013-06-19 ENCOUNTER — Encounter: Payer: Self-pay | Admitting: Nurse Practitioner

## 2013-06-19 NOTE — Telephone Encounter (Signed)
Spoke to pt and he has appt to see paula gunther in [place] [facility] 06/26/13 Joellen [place]

## 2013-06-21 ENCOUNTER — Encounter: Payer: Self-pay | Admitting: Gastroenterology

## 2013-06-22 ENCOUNTER — Encounter: Payer: Self-pay | Admitting: Pulmonary Disease

## 2013-06-22 ENCOUNTER — Encounter: Payer: Self-pay | Admitting: *Deleted

## 2013-06-26 ENCOUNTER — Ambulatory Visit (INDEPENDENT_AMBULATORY_CARE_PROVIDER_SITE_OTHER): Payer: Medicare Other | Admitting: Nurse Practitioner

## 2013-06-26 ENCOUNTER — Telehealth: Payer: Self-pay | Admitting: *Deleted

## 2013-06-26 ENCOUNTER — Encounter: Payer: Self-pay | Admitting: Nurse Practitioner

## 2013-06-26 VITALS — BP 148/90 | HR 64 | Ht 68.0 in | Wt 209.0 lb

## 2013-06-26 DIAGNOSIS — Z0000 Encounter for general adult medical examination without abnormal findings: Secondary | ICD-10-CM | POA: Insufficient documentation

## 2013-06-26 DIAGNOSIS — Z1211 Encounter for screening for malignant neoplasm of colon: Secondary | ICD-10-CM

## 2013-06-26 DIAGNOSIS — Z800 Family history of malignant neoplasm of digestive organs: Secondary | ICD-10-CM

## 2013-06-26 DIAGNOSIS — Z860100 Personal history of colon polyps, unspecified: Secondary | ICD-10-CM

## 2013-06-26 MED ORDER — MOVIPREP 100 G PO SOLR: 1.0000 | Freq: Once | ORAL | Status: DC

## 2013-06-26 NOTE — Patient Instructions (Signed)

## 2013-06-26 NOTE — Telephone Encounter (Signed)
06/26/2013   RE: Brad Brown DOB: [birth year] MRN: [id number]   Dear Dr. Lovena Le,    We have scheduled the above patient for an endoscopic procedure. Our records show that he is on anticoagulation therapy.   Please advise as to how long the patient may come off his therapy of Xarelto prior to the procedure, which is scheduled for 08-17-13.  Please fax back/ or route the completed form to [place], [place].   Sincerely,    Hope Pigeon

## 2013-06-26 NOTE — Progress Notes (Signed)
HPI :  Patient is a 68 year old male with multiple medical problems including but not limited to coronary artery disease, and ischemic cardiomyopathy, chronic systolic heart failure, status post ICD implantation. He is known to Dr. Fuller Plan from a previous colonoscopy done in April 2004 for a family history of colon cancer (brother). Patient is here today to discuss screening colonoscopy. He has no GI symptoms such as rectal bleeding, abdominal pain. No weight loss. Recent labs done at the [facility] center reveal hemoglobin 13.7, MCV 89. Basic metabolic profile was normal. Patient does take meloxicam as needed. He is also relative for atrial fibrillation  Past Medical History  Diagnosis Date  . COPD (chronic obstructive pulmonary disease)   . CAD (coronary artery disease)   . 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 Automatic implantable cardiac defibrillator in situ   . Hyperlipidemia   . Borderline diabetes mellitus   . GERD (gastroesophageal reflux disease)   . Colonic polyp 2004    hyperplastic   . History of renal insufficiency syndrome   . Stroke   . Seizure disorder   . Monoclonal gammopathy   . Arthritis   . Atrial fibrillation   . HTN (hypertension)     Family History  Problem Relation Age of Onset  . Heart attack Brother   . Colon cancer Brother   . Heart attack Mother    History  Substance Use Topics  . Smoking status: Former Smoker -- 0.50 packs/day for 15 years    Types: Cigarettes    Quit date: 03/23/2004  . Smokeless tobacco: Never Used  . Alcohol Use: No     Comment: quit 2-3 years ago   Current Outpatient Prescriptions  Medication Sig Dispense Refill  . Cellulose (AVICEL PH 105 MICRO CELLULOSE) POWD by Does not apply route as needed. Apply to infected area as needed.      . clotrimazole-betamethasone (LOTRISONE) cream Apply to groin rash 2 times daily  30 g  1  . glyBURIDE (DIABETA) 2.5 MG tablet Take one tablet by mouth daily      .  levETIRAcetam (KEPPRA) 500 MG tablet Take one tablet in the morning and two tablets at night      . meloxicam (MOBIC) 7.5 MG tablet Take 1 tablet (7.5 mg total) by mouth daily.  90 tablet  1  . metoprolol tartrate (LOPRESSOR) 25 MG tablet Take 1 tab by mouth twice daily  60 tablet  3  . Multiple Vitamin (MULTIVITAMIN) tablet Take 1 tablet by mouth daily.        . naftifine (NAFTIN) 1 % cream Apply to infected area between toes 2 times daily as needed      . Rivaroxaban (XARELTO) 20 MG TABS tablet Take 20 mg by mouth daily with supper.      . simvastatin (ZOCOR) 20 MG tablet Take one half tablet by mouth daily at bedtime        No current facility-administered medications for this visit.   Allergies  Allergen Reactions  . Lipitor [Atorvastatin]     REACTION: pt states rash from Lipitor    Review of Systems: All systems reviewed and negative except where noted in HPI.   Physical Exam: BP 148/90  Pulse 64  Ht 5\' 8"  (1.727 m)  Wt 209 lb (94.802 kg)  BMI 31.79 kg/m2 Constitutional: Pleasant,well-developed, white male in no acute distress. HEENT: Normocephalic and atraumatic. Conjunctivae are normal. No scleral icterus. Neck supple.  Cardiovascular: Normal rate, regular rhythm.  Pulmonary/chest: Effort normal and breath sounds normal. No wheezing, rales or rhonchi. Abdominal: Soft, nondistended, nontender. Bowel sounds active throughout. There are no masses palpable. No hepatomegaly. Extremities: no edema Lymphadenopathy: No cervical adenopathy noted. Neurological: Alert and oriented to person place and time. Skin: Skin is warm and dry. No rashes noted. Psychiatric: Normal mood and affect. Behavior is normal.   ASSESSMENT AND PLAN:  20. 68 year old male with family history of colon cancer in brother. Brother was diagnosed in his fifties. Patient's last colonoscopy was 10 years ago. The exam was normal except for internal hemorrhoids and a hyperplastic polyp. Patient needs a screening  colonoscopy.The risks, benefits, and alternatives to colonoscopy with possible biopsy and possible polypectomy were discussed with the patient and he consents to proceed.   2. atrial fibrillation. The patient was started on Xarelto 6 months ago. Xarelto should be held for the procedure, we will contact prescribing provider   3. NSAID use. We did discuss the risk of GI bleeding associated with concurrent use of NSAIDs and Xarelto. His [facility] recommended discontinuation of Mobic.   4. Multiple medical problems as listed above. Patient has ICD.

## 2013-06-26 NOTE — Progress Notes (Signed)
Reviewed and agree with management plan. If Xarelto cannot be safely held would plan for ACBE or virtual colonoscopy while on anticoagulation.  Pricilla Riffle. Fuller Plan, MD [facility]

## 2013-06-29 NOTE — Telephone Encounter (Signed)
LMOM AT HOME AND CELL FOR PATIENT TO CALL OFFICE BACK.Marland Kitchen

## 2013-06-29 NOTE — Telephone Encounter (Signed)
Return patients call LMOM.

## 2013-06-29 NOTE — Telephone Encounter (Signed)
Per Dr Lovena Le may stop Xarelto 24 hours prior and resume 48 hours after procedure

## 2013-06-29 NOTE — Telephone Encounter (Signed)
Patient called back and I advised patient of Dr. Tanna Furry recommendation of holding his Xarelto for 24 hrs prior to procedure. Patient verbalized understanding.

## 2013-07-06 ENCOUNTER — Encounter: Payer: Self-pay | Admitting: Gastroenterology

## 2013-07-20 ENCOUNTER — Other Ambulatory Visit: Payer: Self-pay

## 2013-07-21 ENCOUNTER — Ambulatory Visit (INDEPENDENT_AMBULATORY_CARE_PROVIDER_SITE_OTHER): Payer: Medicare Other | Admitting: Internal Medicine

## 2013-07-21 ENCOUNTER — Encounter: Payer: Self-pay | Admitting: Internal Medicine

## 2013-07-21 VITALS — BP 140/92 | HR 63 | Ht 68.0 in | Wt 199.8 lb

## 2013-07-21 DIAGNOSIS — Q248 Other specified congenital malformations of heart: Secondary | ICD-10-CM

## 2013-07-21 DIAGNOSIS — I10 Essential (primary) hypertension: Secondary | ICD-10-CM

## 2013-07-21 DIAGNOSIS — Z95810 Presence of automatic (implantable) cardiac defibrillator: Secondary | ICD-10-CM

## 2013-07-21 DIAGNOSIS — I498 Other specified cardiac arrhythmias: Secondary | ICD-10-CM

## 2013-07-21 LAB — MDC_IDC_ENUM_SESS_TYPE_INCLINIC
Brady Statistic RV Percent Paced: 0.01 %
Date Time Interrogation Session: 20150501104215
HighPow Impedance: 52.5799
HighPow Impedance: 53 Ohm
Implantable Pulse Generator Serial Number: 449377
Lead Channel Pacing Threshold Amplitude: 1 V
Lead Channel Sensing Intrinsic Amplitude: 12 mV
Lead Channel Setting Pacing Amplitude: 2.5 V
Lead Channel Setting Pacing Pulse Width: 0.5 ms
Lead Channel Setting Sensing Sensitivity: 0.3 mV
MDC IDC MSMT BATTERY REMAINING LONGEVITY: 28.8 mo
MDC IDC MSMT BATTERY VOLTAGE: 2.56 V
MDC IDC MSMT LEADCHNL RV IMPEDANCE VALUE: 475 Ohm
MDC IDC MSMT LEADCHNL RV PACING THRESHOLD PULSEWIDTH: 0.5 ms
MDC IDC SET ZONE DETECTION INTERVAL: 300 ms
Zone Setting Detection Interval: 250 ms
Zone Setting Detection Interval: 350 ms

## 2013-07-21 MED ORDER — LOSARTAN POTASSIUM 25 MG PO TABS: 25.0000 mg | ORAL_TABLET | Freq: Every day | ORAL | Status: DC

## 2013-07-21 NOTE — Assessment & Plan Note (Signed)
His blood pressure is elevated and I have asked him to start losartan.

## 2013-07-21 NOTE — Progress Notes (Signed)
HPI [name] returns today for followup. He is a very pleasant 68 year old man with coronary artery disease, and ischemic cardiomyopathy, chronic systolic heart failure, status post ICD implantation. He returns today for followup. The patient denies chest pain. He has class II heart failure symptoms. The patient has done well in the interim. No peripheral edema.  He is back taking Xarelto. His blood pressure has not been well controlled. Allergies  Allergen Reactions  . Lipitor [Atorvastatin]     REACTION: pt states rash from Lipitor     Current Outpatient Prescriptions  Medication Sig Dispense Refill  . Cellulose (AVICEL PH 105 MICRO CELLULOSE) POWD by Does not apply route as needed. Apply to infected area as needed.      . clotrimazole-betamethasone (LOTRISONE) cream Apply to groin rash 2 times daily  30 g  1  . glyBURIDE (DIABETA) 2.5 MG tablet Take one tablet by mouth daily      . levETIRAcetam (KEPPRA) 500 MG tablet Take one tablet in the morning and two tablets at night      . metoprolol tartrate (LOPRESSOR) 25 MG tablet Take 1 tab by mouth twice daily  60 tablet  3  . Multiple Vitamin (MULTIVITAMIN) tablet Take 1 tablet by mouth daily.        . naftifine (NAFTIN) 1 % cream Apply to infected area between toes 2 times daily as needed      . Rivaroxaban (XARELTO) 20 MG TABS tablet Take 20 mg by mouth daily with supper.      . simvastatin (ZOCOR) 20 MG tablet Take one half tablet by mouth daily at bedtime       . losartan (COZAAR) 25 MG tablet Take 1 tablet (25 mg total) by mouth daily.  90 tablet  3  . meloxicam (MOBIC) 7.5 MG tablet Take 1 tablet (7.5 mg total) by mouth daily.  90 tablet  1   No current facility-administered medications for this visit.     Past Medical History  Diagnosis Date  . COPD (chronic obstructive pulmonary disease)   . CAD (coronary artery disease)   . 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 Automatic implantable cardiac defibrillator  in situ   . Hyperlipidemia   . Borderline diabetes mellitus   . GERD (gastroesophageal reflux disease)   . Colonic polyp 2004    hyperplastic   . History of renal insufficiency syndrome   . Stroke   . Seizure disorder   . Monoclonal gammopathy   . Arthritis   . Atrial fibrillation   . HTN (hypertension)     ROS:   All systems reviewed and negative except as noted in the HPI.   Past Surgical History  Procedure Laterality Date  . Cardiac catheterization  1987    Showed distal left circumflex 100% occluded   . Cardiac defibrillator placement  08/17/2006    Implantation of a St. Jude single chamber defibrillator  . Inguinal hernia repair Left      Family History  Problem Relation Age of Onset  . Heart attack Brother   . Colon cancer Brother   . Heart attack Mother      History   Social History  . Marital Status: Single    Spouse Name: N/A    Number of Children: 1  . Years of Education: N/A   Occupational History  . retired    Social History Main Topics  . Smoking status: Former Smoker -- 0.50 packs/day for 15 years  Types: Cigarettes    Quit date: 03/23/2004  . Smokeless tobacco: Never Used  . Alcohol Use: No     Comment: quit 2-3 years ago  . Drug Use: No  . Sexual Activity: Not on file   Other Topics Concern  . Not on file   Social History Narrative   ICD-St. Jude  Remote-Yes     BP 140/92  Pulse 63  Ht 5\' 8"  (1.727 m)  Wt 199 lb 12.8 oz (90.629 kg)  BMI 30.39 kg/m2  Physical Exam:  Well appearing 68 year old man,NAD HEENT: Unremarkable Neck:  No JVD, no thyromegally Lungs:  Clear with no wheezes, rales, or rhonchi. HEART:  Regular rate rhythm, no murmurs, no rubs, no clicks Abd:  soft, positive bowel sounds, no organomegally, no rebound, no guarding Ext:  2 plus pulses, no edema, no cyanosis, no clubbing Skin:  No rashes no nodules Neuro:  CN II through XII intact, motor grossly intact   DEVICE  Normal device function.  See  PaceArt for details.   Assess/Plan:

## 2013-07-21 NOTE — Assessment & Plan Note (Signed)
His St. Jude device is working normally. Will recheck in several months.

## 2013-07-21 NOTE — Patient Instructions (Addendum)
Your physician wants you to follow-up in: 12 months with Dr Knox Saliva will receive a reminder letter in the mail two months in advance. If you don't receive a letter, please call our office to schedule the follow-up appointment.  Remote monitoring is used to monitor your Pacemaker of ICD from home. This monitoring reduces the number of office visits required to check your device to one time per year. It allows [place] to keep an eye on the functioning of your device to ensure it is working properly. You are scheduled for a device check from home on 10/23/13. You may send your transmission at any time that day. If you have a wireless device, the transmission will be sent automatically. After your physician reviews your transmission, you will receive a postcard with your next transmission date.  Your physician has recommended you make the following change in your medication:  1) Start Losartan 25mg  daily    Your device battery has approximately 2 1/2 years of battey left

## 2013-07-21 NOTE — Assessment & Plan Note (Signed)
He carries a diagnosis of Brugada syndrome after experiencing seizure like activity and RBBB and ST elevation on ECG. He has had no recurrent arrhythmias. Will follow.

## 2013-07-25 ENCOUNTER — Ambulatory Visit (INDEPENDENT_AMBULATORY_CARE_PROVIDER_SITE_OTHER): Payer: Medicare Other | Admitting: Pulmonary Disease

## 2013-07-25 ENCOUNTER — Encounter: Payer: Self-pay | Admitting: Pulmonary Disease

## 2013-07-25 ENCOUNTER — Telehealth: Payer: Self-pay | Admitting: Pulmonary Disease

## 2013-07-25 VITALS — BP 134/86 | HR 68 | Temp 97.9°F | Ht 69.0 in | Wt 202.8 lb

## 2013-07-25 DIAGNOSIS — I498 Other specified cardiac arrhythmias: Secondary | ICD-10-CM

## 2013-07-25 DIAGNOSIS — N259 Disorder resulting from impaired renal tubular function, unspecified: Secondary | ICD-10-CM

## 2013-07-25 DIAGNOSIS — Z95810 Presence of automatic (implantable) cardiac defibrillator: Secondary | ICD-10-CM

## 2013-07-25 DIAGNOSIS — I4891 Unspecified atrial fibrillation: Secondary | ICD-10-CM

## 2013-07-25 DIAGNOSIS — D472 Monoclonal gammopathy: Secondary | ICD-10-CM

## 2013-07-25 DIAGNOSIS — I2510 Atherosclerotic heart disease of native coronary artery without angina pectoris: Secondary | ICD-10-CM

## 2013-07-25 DIAGNOSIS — J45909 Unspecified asthma, uncomplicated: Secondary | ICD-10-CM

## 2013-07-25 DIAGNOSIS — E785 Hyperlipidemia, unspecified: Secondary | ICD-10-CM

## 2013-07-25 DIAGNOSIS — I6350 Cerebral infarction due to unspecified occlusion or stenosis of unspecified cerebral artery: Secondary | ICD-10-CM

## 2013-07-25 DIAGNOSIS — R7309 Other abnormal glucose: Secondary | ICD-10-CM

## 2013-07-25 DIAGNOSIS — Q248 Other specified congenital malformations of heart: Secondary | ICD-10-CM

## 2013-07-25 DIAGNOSIS — I10 Essential (primary) hypertension: Secondary | ICD-10-CM

## 2013-07-25 MED ORDER — METHYLPREDNISOLONE ACETATE 80 MG/ML IJ SUSP
80.0000 mg | Freq: Once | INTRAMUSCULAR | Status: AC
  Administered 2013-07-25: 80 mg via INTRAMUSCULAR

## 2013-07-25 MED ORDER — AMOXICILLIN-POT CLAVULANATE 875-125 MG PO TABS: 1.0000 | ORAL_TABLET | Freq: Two times a day (BID) | ORAL | Status: DC

## 2013-07-25 MED ORDER — PREDNISONE (PAK) 5 MG PO TABS: ORAL_TABLET | ORAL | Status: DC

## 2013-07-25 NOTE — Patient Instructions (Signed)
Today we updated your med list in our EPIC system...    Continue your current medications the same...  Today we gave you a Depo shot...    We wrote a new prescription for a Pred dosepak to take as directed...    And we wrote for an anitbiotic- AUGMENTIN 875mg - take one tab twice daily til gone...  You should also take the OTC MUCINEX 600mg - 2tabs twice daily w/ plenty of fluids...    And OTC ALIGN probiotic one daily while you are on the Augmentin...  Call for any questions.Marland KitchenMarland Kitchen

## 2013-07-25 NOTE — Telephone Encounter (Signed)
Pt c/o deep cough with discolored mucus production and some wheezing. Pt requests OV with SN or TP. Pt scheduled for appt with Dr Lenna Gilford today at 12pm.  Nothing further needed

## 2013-07-26 ENCOUNTER — Encounter: Payer: Self-pay | Admitting: Internal Medicine

## 2013-07-28 ENCOUNTER — Encounter: Payer: Self-pay | Admitting: Pulmonary Disease

## 2013-07-28 NOTE — Progress Notes (Signed)
Subjective:    Patient ID: Brad Brown, male    DOB: 1946-02-28, 68 y.o.   MRN: [id number]  HPI 68 y/o Brad Brown here for a follow up visit... he has mult med problems as noted & he has mult specialty physicians tending to his medical needs... he gets his meds from the [facility]...  ~  March 14, 2012:  60mo ROV & Deveron reports doing satis, no new complaints or concerns...  We reviewed the following medical problems during today's office visit>>  NOTE: he is getting regular general medical follow up at the [facility] & they provide all his meds (the [facility] [address] [facility]'t [facility] a [facility] in 46yr)>>    COPD> he had a mild exac10/13 treated by TP w/ ZPak, Depo80, Mucinex, Saline & resolved...    HBP> on Metop25-1/2Bid; BP=130/82 & he denies CP, palpit, dizzy, syncope, SOB, edema, etc...    CAD, Brugada syndrome> on Plavix75 now per the [place]; followed by [name] & [name]- their notes are reviewed...    Chol> on Simva20-1/2 tab; last FLP 5/13 showed TChol 112, TG 144, HDL 45, LDL 38    DM> on Glyburide2.5; last labs 5/13 revealed BS= 97, A1c= 6.2    Renal Insuffic> followed by [name] & Creat has been stable in the 1/1-1.3 range...    Hx Stroke> off Aggrenox per the [facility], they substituted [id number]; he denies any cerebral ischemic symptoms...    Hx Seizure> on Keppra500Bid; doing well w/o recurrent seizures on the med...    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 We reviewed prob list, meds, xrays and labs> see below for updates >>   LABS 10/13 by DrShadad> reviewed in EPIC...  LABS 9/13 by [name]> scanned into EPIC...   ~  October 17, 2012:  62mo ROV & [name] had an AICD shock 3/14- seen by [name] w/ interrogation revealing inapprop shock for rapid AFib=> cardioverted; I note he has not had a 2DEcho in many yrs; EP felt he needed anticoag instead of antiplatlet Rx- initially started on Xarelto, then changed to Coumadin, now back on Xarelto apparently because the [place]  has just started covering for this med... They also incr his Metoprolol to 25mg  Bid, he is maintaining NSR... He last saw [name] in 2012 & was told to f/u as needed... We reviewed the following medical problems during today's office visit >>     COPD> he had a mild exac10/13 treated by TP w/ ZPak, Depo80, Mucinex, Saline & resolved; no prob since then...    HBP> on Metop25Bid; BP=142/78 & he denies CP, palpit, dizzy, syncope, SOB, edema, etc...    CAD, Brugada syndrome, new PAF> off Plavix75 now & on XARELTO15- see above; followed by [name]- his notes are reviewed...    Chol> on Simva40-1/2 tab; last FLP 6/14 at [facility] showed TChol 152, TG 274, HDL 44, LDL 53; needs better low fat diet...    DM> on Glyburide2.5; last labs 6/14 at [facility] revealed BS= 97, A1c= 6.1    Renal Insuffic> followed by [name] & Creat has been stable in the 1.1-1.3 range; labs 6/14 from [place] showed BUN=21, Creat=1.5    Hx Stroke> off Aggrenox & on XARELTO15 now; he denies any cerebral ischemic symptoms...    Hx Seizure> on Keppra500Tid; doing well w/o recurrent seizures on the med...    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 We reviewed prob list, meds, xrays and labs> see below for updates >>  LABS 6/14 from [facility] scanned into EPIC...    ~  December 14, 2012:  Pt called today requesting OV to check groin rash> states it's been present for 3-4d rash, raw, irritated & pruritic, in the intertrig folds & he's tried a salve he got "at the dollar store";  Exam shows characteristic rash of cutaneous candida & is sl excoriated etc;  We discussed Rx w/ Diflucan orally & Lotrisone cream topically, +local care/ keep skin off skin/ etc... If not resolved we will send him to [facility]...    He notes that his breathing is good, BP=136/84, no CP/ palpit/ SOB/ edema, DM control has been good on meds, etc;  He is reminded to drink lots of water...  ~  June 14, 2013:  30mo ROV & [name] notes pain/arthritis in  hands, hot soaks help some, wants med- try Mobic7.5mg  prn...  He had a URI 2/15 & saw TP> treated w/ ZPak & Hydromet w/ improvement... We reviewed the following medical problems during today's office visit >>     COPD> recent exac 2/15 treated w/ ZPak, Mucinex & Hydromet- resolved; no prob since then states breathing is ok- denies cough, sput, dyspnea, CP, etc...    HBP> on Metop25Bid; BP=138/94 & he denies CP, palpit, dizzy, syncope, SOB, edema, etc; wt is up to 204 & reminded about diet, exercise & wt reduction...    CAD, Brugada syndrome, new PAF> on XARELTO20- see above; followed by [name]- his notes are reviewed...    Chol> on Simva40-1/2 tab; last FLP 3/15 here showed TChol 159, TG 273, HDL 47, LDL 57; needs better low fat diet & wt reduction...    DM> on Glyburide2.5; labs 3/15 here revealed BS= 104, A1c= 6.9 and we reviewed diet, exercise 7 wt reduction needed...    Renal Insuffic> followed by [name] & Creat has been stable in the 1.1-1.3 range (Stage2); labs 3/15 here showed BUN=13, Creat=1.4    GU> followed by DrTannenbaum on Trimix injections and Cialis; he knows to be very careful w/ shots in light of his Xarelto...    Hx Stroke> on [place]; he denies any cerebral ischemic symptoms...    Hx Seizure> on Keppra500Tid; doing well w/o recurrent seizures on the med...    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 We reviewed prob list, meds, xrays and labs> see below for updates >>   LABS 3/15:  FLP- ok x TG=273 on Simva20;  Chems- ok x BS=104, A1c=6.9, Cr=1.4 on Glybur2.5;  TSH=1.92;  PSA=0.68  ~  Jul 25, 2013:  6wk ROV & add-on appt requested for 2wk hx cough, yellow mucus, chest congestion & wheezing noted; Exam is c/w AB & we decided to treat w/ Depo80, dosepak, Augmentin 875Bid, Mucinex, Align, etc...    He had a follow up appt w/ [name] 5/15> CAD, ischemic cardiomyop, chr sys CHF, s/p ICD implantation (devise check was ok), Brugada  syndrome; on Metop25Bid, Losar25, Xarelto20; BP improved 134/86, denies CP, palpit, SOB, edema...    Since he was here last- Delsa Sale has seen GI regarding f/u colonoscopy> +fam hx of colon ca in brother, last colonoscopy 2004 by DrStark, exam was neg & colon sched for 08/17/13... We reviewed prob list, meds, xrays and labs> see below for updates >>            Problem List:  COPD - exsmoker quit >5 years now "I learned my lesson"... no regular meds... denies recurrent symptoms~ denies cough, sputum, hemoptysis,  worsening dyspnea, wheezing, chest pain, snoring, daytime hypersomnolence, etc... ~  CXR 5/11 showed borderline cardiomeg, sl hyperinflation, clear lungs, pacer w/ AICD lead w/o change... ~  He had a mild exac10/13 treated by TP w/ ZPak, Depo80, Mucinex, Saline & resolved; no prob since then... ~  He had another mild exac 2/15 treated w/ ZPak, Mucinex, Hydromet & improved... ~  5/15: presented w/ AB exac treated w/ Depo80, dosepak, Augmentin, Mucinex, etc...   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   2DEcho 8/05 showed norm LVF w/ EF=50-55% but AK in inferobasialr wall, sl thickening of AoV&MV w/ trivAI & MR, LA mildly dilated, mild atheroma in desc Ao...  Myoview 2/08 showed sm inferolat infarct w/o ischemia & EF=59%... hosp 5/08 w/ seizure &  EKG showing Brugada syndrome- cath w/ normal coronaries, good LVF, and defibrillator implanted... he remains on ASA, & METOPROLOL 25mg - 1/2Bid... ~  5/10: BP= 138/80 & even better at home, he says... no CP, palpit, dizzy, etc... ~  11/10: BP= 128/80 & feeling well- no CP, palpit, dizzy, etc... ~  5/11:  BP= 132/78 & feeling well- no complaints or concerns... ~  11/11:  BP= 120/80 & he is asymptomatic... ~  5/12:  BP= 132/76 &  he continues asymptomatic... ~  11/12:  BP= 118/82 & he remains asymptomatic... ~  EKG 11/12 showed SBrady, rate59, IVCD/ pseudo-RBBB, otherw neg EKG... ~  5/13:  BP= 130/80 & feeling well w/o CP, palpit, SOB, edema, etc... ~  9/13:  He had f/u [name]- followed for non-ischemic cardiomyop, chr sys heart failure, HBP, etc; doing well w/ ICD- no discharges, stable, rec to continue follow up... ~  3/14:  Heyden had an AICD shock 3/14- seen by [name] w/ interrogation revealing inapprop shock for rapid AFib=> cardioverted; EP felt he needed anticoag instead of antiplatlet Rx- initially started on Xarelto, then changed to Coumadin, now back on Xarelto apparently because the [place] has just started covering for this med... They also incr his Metoprolol to 25mg  Bid, he is maintaining NSR... He last saw [name] in 2012 & was told to f/u as needed; I note he has not had a 2DEcho in many yrs... ~  7/14:  BP= 142/78 & holding NSR on Metop25Bid & Xarelto15; he denies CP, notes occas palpit, denies SOB, edema, etc... ~  3/15:  on Metop25Bid & XARELTO20- BP=138/94 but better at home he says, followed by [name]- his notes are reviewed...  HYPERLIPIDEMIA - prev w/ fair control on Antara, but [facility] switched him to SIMVASTATIN 20mg /d. ~  FLP 5/09 showed TChol 121, TG 93, HDL 36, LDL 67 ~  FLP 5/10 on Antara130 showed TChol 143, TG 140, HDL 45, LDL 70 ~  FLP 11/10 on Antara130 showed TChol 143, TG 194, HDL 46, LDL 59... same med, better diet; but [facility] ch to Simva20. ~  FLP 5/11 on Simva20 showed TChol 122, TG 193, HDL 41, LDL 43 ~  FLP 11/11 on Simva20 showed TChol 137, TG 228, HDL 42, LDL 50... needs better low fat diet. ~  FLP 5/12 on Simva20 showed Tchol 141, TG 192, HDL 42, LDL 61 ~  FLP 11/12 on Simva20 showed TChol 133, TG 189, HDL 45, LDL 50... Continue  same & better low fat diet. ~  FLP 5/13 on Simva20 showed TChol 112, TG 144, HDL 45, LDL 38 ~  FLP 6/14 at the [place] on Simva20 showed TChol 152, TG 274, HDL 44,  LDL 53; needs better low fat diet.  ~  FLP 3/15 on Simva20 showed TChol 159, TG 273, HDL 47, LDL 57; needs better low fat diet & wt reduction.   DIABETES MELLITUS, BORDERLINE (ICD-790.29) - prev on Metformin but [facility] changed to Glimep2mg , then to GLYBURIDE 2.5mg - taking 1/2 tab Qam. ~  labs 11/09 on diet showed BS= 116, A1c= 7.1.Marland Kitchen. rec> start Metformin 500mg /d. ~  labs 5/10 on Metform500 showed BS= 96, A1c= 6.1.Marland KitchenMarland Kitchen continue same. ~  [facility] changed pt to Gimepiride 2mg /d... ~  labs 11/10 on Glimep2 showed BS= 78, A1c= 5.9.Marland Kitchen. rec> decr Glimep to 1/2 tab in AM; [facility] ch to Glybur2.5mg . ~  labs 5/11 on Glybur2.5- 1/2 tab showed BS= 79, A1c= 6.0 ~  labs 11/11 on Glybur2.5-1/2tab showed BS= 99, A1c= 6.4 ~  Labs 5/12 on Glybur2.5-1/2tab showed BS= 95, A1c=6.5 ~  Labs 11/12 on Glybur2.5-1/2tab showed BS= 83, A1c= 6.3 ~  1/13:  He had Eye eval DrTanner- neg, no retinopathy... ~  Labs 5/13 on Glybur2.5-1/2tab showed BS= 97, A1c= 6.2 ~  12/13: note from [place], Ophthalmology- no DM retinopathy... ~  7/14: on Glyburide2.5; last labs 6/14 at [facility] revealed BS= 97, A1c= 6.1 ~  12/14: note from [place] Vaya, Ophthalmology- no DM retinopathy... ~  3/15: on Glyburide2.5; labs 3/15 here revealed BS= 104, A1c= 6.9 and we reviewed diet, exercise 7 wt reduction needed...  POS FAMILY HX OF COLON CA IN Brother >>  ~  Cree has had prev colonoscopies by DrStark 1/99 & 4/04> he chart was rechecked by GI 4/15 & that note is reviewed- sche for f/u colonoscopy 08/17/13...  Hx of RENAL INSUFFICIENCY -  ~  baseline Creat = 1.8; he has hx of renal insuffic related to ACE/ ARB therapy & improved to baseline off this Rx; followed  by [facility]- notes & labs reviewed. ~  labs 2/09 by DrShadad w/ Cr=1.6 ~  labs 9/09 by [name] showed BUN= 20, Creat= 1.65 ~  labs 11/09 here showed BUN= 12, Creat= 1.6 ~  labs 5/10 showed BUN= 14, creat= 1.2 ~  labs 11/10 showed BUN= 14, Creat= 1.1 ~  labs 5/11 showed BUN= 13, Creat= 1.2 ~  Labs  5/12 showed BUN= 12, Creat= 1.2 ~  Labs 11/12 showed BUN= 16, Creat= 1.2 ~  Labs 5/13 showed BUN=13, Creat= 1.3  ~  10/13: he had yearly f/u [name], Creat=1.2 stable, goals discussed w/ pt, note from Nephrology reviewed... ~  7/14:  followed by [name] & Creat has been stable in the 1.1-1.3 range; labs 6/14 from [place] showed BUN=21, Creat=1.5 ~  11/14: he had f/u [name] & his note is reviewed... ~   followed by [name] & Creat has been stable in the 1.1-1.3 range (Stage2); labs 3/15 here showed BUN=13, Creat=1.4  Hx BPH & Hx ED >> followed by Urology & he has tried Cialis- both prn & daily use;  He has Primix shots for ED but he tells me that he is not using this med... ~  12/13: he had yearly f/u DrTannenbaum> bladder neck obstruction & ED on Cialis5mg /d & PEP injections (TRIMIX) ~  12/14: he had f/u DrTannenbaum> pt injects TRIMIX & takes Cialis for ED, told to be very careful in light of his Xarelto,  ~  Labs  here 3/15 showed PSA= 0.68  Hx STROKE & SEIZURE DISORDER - eval by neuro/ DrSethi...  ~  on ASA 81mg Bid,  AGGRENOX Bid, & KEPPRA 500mg Bid... he is stable without focal weakness, sensory changes, speech problems, etc...  ~  5/10 he denies memory problems, but in need of additional evaluation, DrSethi's note 12/09 doesn't indicate difficulty in this area. ~  11/10: continues to deny problems... ~  5/11:  states he had a recent seizure and is due for f/u w/ DrSethi soon... I am still concerned about his memory & affect. ~  11/11 & 5/12>  we do not have notes from Neuro to review... 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 ~  5/13: he gets all his meds filled at the [place] but didn't bring bottles or list; he thinks they changed Aggrenox to something else (?Plavix); reminded to bring  all meds to every doctor visit... ~  3/14: Kortez had an AICD shock 3/14- seen by [name] w/ interrogation revealing inapprop shock for rapid AFib=> cardioverted; EP felt he needed anticoag instead of antiplatlet Rx- initially started on Xarelto, then changed to Coumadin, now back on Xarelto apparently because the [place] has just started covering for this med.  MONOCLONAL GAMMOPATHY - full eval from DrShadad and latest notes reviewed... of interest the pt tells me he has been a regular blood donor and freq gave "double units"... 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 he has some free kappa light chains in the urine as well... ~  8/08:  Neg metastatic bone survey; mild compression T6, degenerative cerv spondylosis... ~  10/11:  f/u DrShadad w/ extensive labs reviewed> no evid progression, continues on observation. ~  11/12:  Now that DrShadad et al are in the Epic system his notes & labs are avail to review (yearly f/u each fall). 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 ~  11/14: he had f/u DrShadad> MGUS, IgG kappa, on surveillance & stable w/o signif change in his labs (reviewed in Epic)...   Past Surgical History  Procedure Laterality Date  . Cardiac catheterization  1987    Showed distal left circumflex 100% occluded   . Cardiac defibrillator placement  08/17/2006    Implantation of a [employer] single chamber defibrillator  . Inguinal hernia repair Left     Outpatient Encounter Prescriptions as of 07/25/2013  Medication Sig  . Cellulose (AVICEL PH 105 MICRO CELLULOSE) POWD by Does not apply route as needed. Apply to infected area as needed.  . clotrimazole-betamethasone (LOTRISONE) cream Apply to groin rash 2 times  daily  . glyBURIDE (DIABETA) 2.5 MG tablet Take one tablet by mouth daily  . levETIRAcetam (KEPPRA) 500 MG tablet Take one tablet in the morning and two tablets at night  . losartan (COZAAR) 25 MG tablet Take 1 tablet (25 mg total) by mouth daily.  . metoprolol tartrate (LOPRESSOR) 25 MG tablet Take 1 tab by mouth twice daily  . Multiple Vitamin (MULTIVITAMIN) tablet Take 1 tablet  by mouth daily.    . naftifine (NAFTIN) 1 % cream Apply to infected area between toes 2 times daily as needed  . Rivaroxaban (XARELTO) 20 MG TABS tablet Take 20 mg by mouth daily with supper.  . simvastatin (ZOCOR) 20 MG tablet Take one half tablet by mouth daily at bedtime   . amoxicillin-clavulanate (AUGMENTIN) 875-125 MG per tablet Take 1 tablet by mouth 2 (two) times daily.  . meloxicam (MOBIC) 7.5 MG tablet Take 1 tablet (7.5 mg total) by mouth daily.  . predniSONE (STERAPRED UNI-PAK) 5 MG TABS tablet Take as directed  . [EXPIRED] methylPREDNISolone acetate (DEPO-MEDROL) injection 80 mg     Allergies  Allergen Reactions  . Lipitor [Atorvastatin]     REACTION: pt states rash from Lipitor    Current Medications, Allergies, Past Medical History, Past Surgical History, Family History, and Social History were reviewed in [employer] record.   Review of Systems        See HPI - all other systems neg except as noted... The patient denies anorexia, fever, weight loss, weight gain, vision loss, decreased hearing, hoarseness, chest pain, syncope, dyspnea on exertion, peripheral edema, prolonged cough, headaches, hemoptysis, abdominal pain, melena, hematochezia, severe indigestion/heartburn, hematuria, incontinence, muscle weakness, suspicious skin lesions, transient blindness, difficulty walking, depression, unusual weight change, abnormal bleeding, enlarged lymph nodes, and angioedema.     Objective:   Physical Exam    WD, WN, 68 y/o Brad Brown in NAD... GENERAL:  Alert & oriented; pleasant &  cooperative... HEENT:  [place]/AT, EOM-wnl, PERRLA, EACs-clear, TMs-wnl, NOSE-clear, THROAT-clear & wnl. NECK:  Supple w/ fairROM; no JVD; normal carotid impulses w/o bruits; no thyromegaly or nodules palpated; no lymphadenopathy. CHEST:  Clear to P & A;  with few scat rhonchi & mild end-exp wheezing... HEART:  regular, gr 1/6 SEM without rubs or gallops heard... ABDOMEN:  Soft & nontender; normal bowel sounds; no organomegaly or masses detected. EXT: without deformities or arthritic changes; no varicose veins/ venous insuffic/ or edema. NEURO:  CN's intact; motor testing normal; sensory testing normal; gait normal & balance OK. DERM:  Prev candida rash has resolved...  RADIOLOGY DATA:  Reviewed in the EPIC EMR & discussed w/ the patient...  LABORATORY DATA:  Reviewed in the EPIC EMR & discussed w/ the patient...   Assessment & Plan:   AB exac> presented 5/15 w/ cough, yellow sput, chest congestion; we decided to treat w/ Pred, Augmentin, cough syrup...  COPD>  He quit smoking >8yrs ago & is generally stable...  HBP/ CAD/ Brugada syndrome/ PAF>  followed by [name] for Cards w/ AICD- stable, on Metoprolol25Bid, Losar25, & Xarelto20...  HYPERLIPIDEMIA>  On Simva20 per the [facility] + low fat diet;  F/u FLP is ok x elevTG and we reviewed low fat diet & need for wt loss...  DM>  On Glyburide monotherapy per the [facility], discussed diet, exercise, BS monitoring at home etc; he denies hypoglycemic episodes & A1c= 6.9.Marland KitchenMarland Kitchen  RENAL>  Creat= 1.4 recently, and he continues to see [name] yearly...  Hx Stroke/ Seizure>  On Keppra & stable w/o cerebral ischemic symptoms...  MGUS>  followed by DrShadad w/ labs each Autumn...   Patient's Medications  New Prescriptions   AMOXICILLIN-CLAVULANATE (AUGMENTIN) 875-125 MG PER TABLET    Take 1 tablet by mouth 2 (two) times daily.   PREDNISONE (STERAPRED UNI-PAK) 5 MG TABS TABLET    Take as directed  Previous Medications   CELLULOSE (AVICEL PH 105 MICRO  CELLULOSE) POWD  by Does not apply route as needed. Apply to infected area as needed.   CLOTRIMAZOLE-BETAMETHASONE (LOTRISONE) CREAM    Apply to groin rash 2 times daily   GLYBURIDE (DIABETA) 2.5 MG TABLET    Take one tablet by mouth daily   LEVETIRACETAM (KEPPRA) 500 MG TABLET    Take one tablet in the morning and two tablets at night   LOSARTAN (COZAAR) 25 MG TABLET    Take 1 tablet (25 mg total) by mouth daily.   MELOXICAM (MOBIC) 7.5 MG TABLET    Take 1 tablet (7.5 mg total) by mouth daily.   METOPROLOL TARTRATE (LOPRESSOR) 25 MG TABLET    Take 1 tab by mouth twice daily   MULTIPLE VITAMIN (MULTIVITAMIN) TABLET    Take 1 tablet by mouth daily.     NAFTIFINE (NAFTIN) 1 % CREAM    Apply to infected area between toes 2 times daily as needed   RIVAROXABAN (XARELTO) 20 MG TABS TABLET    Take 20 mg by mouth daily with supper.   SIMVASTATIN (ZOCOR) 20 MG TABLET    Take one half tablet by mouth daily at bedtime   Modified Medications   No medications on file  Discontinued Medications   No medications on file

## 2013-08-17 ENCOUNTER — Encounter: Payer: Self-pay | Admitting: Gastroenterology

## 2013-08-17 ENCOUNTER — Ambulatory Visit (AMBULATORY_SURGERY_CENTER): Payer: Medicare Other | Admitting: Gastroenterology

## 2013-08-17 VITALS — BP 138/89 | HR 59 | Temp 97.1°F | Resp 14 | Ht 69.0 in | Wt 209.0 lb

## 2013-08-17 DIAGNOSIS — D126 Benign neoplasm of colon, unspecified: Secondary | ICD-10-CM

## 2013-08-17 DIAGNOSIS — Z800 Family history of malignant neoplasm of digestive organs: Secondary | ICD-10-CM

## 2013-08-17 DIAGNOSIS — Z1211 Encounter for screening for malignant neoplasm of colon: Secondary | ICD-10-CM

## 2013-08-17 LAB — GLUCOSE, CAPILLARY
GLUCOSE-CAPILLARY: 82 mg/dL (ref 70–99)
Glucose-Capillary: 89 mg/dL (ref 70–99)
Glucose-Capillary: 142 mg/dL — ABNORMAL HIGH (ref 70–99)

## 2013-08-17 MED ORDER — SODIUM CHLORIDE 0.9 % IV SOLN: 500.0000 mL | INTRAVENOUS | Status: DC

## 2013-08-17 NOTE — Patient Instructions (Signed)

## 2013-08-17 NOTE — Op Note (Signed)
[place]  [address]. [place], [address]   COLONOSCOPY PROCEDURE REPORT PATIENT: Brad Brown, Brad Brown  MR#: [id number] BIRTHDATE: 03-07-1946 , 67  yrs. old GENDER: Male ENDOSCOPIST: Ladene Artist, MD, [facility] PROCEDURE DATE:  08/17/2013 PROCEDURE:   Colonoscopy with biopsy and snare polypectomy First Screening Colonoscopy - Avg.  risk and is 50 yrs.  old or older - No.  Prior Negative Screening - Now for repeat screening. N/A  History of Adenoma - Now for follow-up colonoscopy & has been > or = to 3 yrs.  N/A  Polyps Removed Today? Yes. ASA CLASS:   Class III INDICATIONS:elevated risk screening-patient's immediate family history of colon cancer. MEDICATIONS: MAC sedation, administered by CRNA and propofol (Diprivan) 150mg  IV DESCRIPTION OF PROCEDURE:   After the risks benefits and alternatives of the procedure were thoroughly explained, informed consent was obtained.  A digital rectal exam revealed no abnormalities of the rectum.   The LB [id number]  endoscope was introduced through the anus and advanced to the cecum, which was identified by both the appendix and ileocecal valve. No adverse events experienced.   The quality of the prep was good, using MoviPrep  The instrument was then slowly withdrawn as the colon was fully examined.  COLON FINDINGS: Three sessile polyps measuring 4-7 mm were found at the cecum.  A polypectomy was performed with a cold snare.  The resection was complete and the polyp tissue was completely retrieved.   Two sessile polyps were found in the ascending colon and transverse colon.  A polypectomy was performed with cold forceps and with a cold snare.   Two sessile polyps measuring 4-5 mm in size were found in the sigmoid colon.  A polypectomy was performed with cold forceps.  The resection was complete and the polyp tissue was completely retrieved.   The colon was otherwise normal.  There was no diverticulosis, inflammation,  polyps or cancers unless previously stated.  Retroflexed views revealed small internal hemorrhoids. The time to cecum=1 minutes 31 seconds. Withdrawal time=14 minutes 01 seconds.  The scope was withdrawn and the procedure completed. COMPLICATIONS: There were no complications.  ENDOSCOPIC IMPRESSION: 1.   Three sessile polyps 4-7 mm at the cecum; polypectomy was performed with a cold snare 2.   Two sessile polyps 4-5 mm in the ascending, transverse colon; polypectomy performed-cold forceps and with cold snare 3.   Two sessile polyps measuring 4-5 mm in the sigmoid colon; polypectomy performed with cold forceps 4.   Small internal hemorrhoids  RECOMMENDATIONS: 1.  Hold aspirin, aspirin products, and anti-inflammatory medication for 2 weeks. 2.  Await pathology results 3.  Repeat colonoscopy in 3 years if 3 or more polyps are adenomatous; otherwise 5 years 4.  Resume Xarelto in 3 days  eSigned:  Ladene Artist, MD, [facility] 08/17/2013 3:56 PM

## 2013-08-17 NOTE — Progress Notes (Signed)
Called to room to assist during endoscopic procedure.  Patient ID and intended procedure confirmed with present staff. Received instructions for my participation in the procedure from the performing physician.  

## 2013-08-17 NOTE — Progress Notes (Signed)
Patient stating he took a sip of Sprite at 1330 today, con=mpleted his prep and then 16 oz of water at  1330. Informed dr. Fuller Plan and Unk Pinto, CRNA of blood sugar of 82, baseline 95-100 and NPO status. D5w hung , blood sugar rechecked at 1454, 89.

## 2013-08-17 NOTE — Progress Notes (Signed)
Report to PACU, RN, vss, BBS= Clear.  

## 2013-08-18 ENCOUNTER — Telehealth: Payer: Self-pay | Admitting: *Deleted

## 2013-08-18 NOTE — Telephone Encounter (Signed)
  Follow up Call-  Call back number 08/17/2013  Post procedure Call Back phone  # [phone]  Permission to leave phone message Yes     Patient questions:  Do you have a fever, pain , or abdominal swelling? no Pain Score  0 *  Have you tolerated food without any problems? yes  Have you been able to return to your normal activities? yes  Do you have any questions about your discharge instructions: Diet   no Medications  no Follow up visit  no  Do you have questions or concerns about your Care? no  Actions: * If pain score is 4 or above: No action needed, pain <4.

## 2013-08-25 ENCOUNTER — Encounter: Payer: Self-pay | Admitting: Gastroenterology

## 2013-08-25 ENCOUNTER — Ambulatory Visit (INDEPENDENT_AMBULATORY_CARE_PROVIDER_SITE_OTHER): Payer: Medicare Other | Admitting: Internal Medicine

## 2013-08-25 ENCOUNTER — Encounter: Payer: Self-pay | Admitting: Internal Medicine

## 2013-08-25 VITALS — BP 134/80 | HR 64 | Temp 97.5°F | Ht 69.0 in | Wt 203.5 lb

## 2013-08-25 DIAGNOSIS — K219 Gastro-esophageal reflux disease without esophagitis: Secondary | ICD-10-CM

## 2013-08-25 DIAGNOSIS — I2510 Atherosclerotic heart disease of native coronary artery without angina pectoris: Secondary | ICD-10-CM

## 2013-08-25 DIAGNOSIS — Z23 Encounter for immunization: Secondary | ICD-10-CM

## 2013-08-25 DIAGNOSIS — I10 Essential (primary) hypertension: Secondary | ICD-10-CM

## 2013-08-25 DIAGNOSIS — R569 Unspecified convulsions: Secondary | ICD-10-CM

## 2013-08-25 DIAGNOSIS — J449 Chronic obstructive pulmonary disease, unspecified: Secondary | ICD-10-CM

## 2013-08-25 DIAGNOSIS — I4891 Unspecified atrial fibrillation: Secondary | ICD-10-CM

## 2013-08-25 DIAGNOSIS — M129 Arthropathy, unspecified: Secondary | ICD-10-CM

## 2013-08-25 DIAGNOSIS — E785 Hyperlipidemia, unspecified: Secondary | ICD-10-CM

## 2013-08-25 DIAGNOSIS — M1990 Unspecified osteoarthritis, unspecified site: Secondary | ICD-10-CM

## 2013-08-25 DIAGNOSIS — R7309 Other abnormal glucose: Secondary | ICD-10-CM

## 2013-08-25 DIAGNOSIS — N259 Disorder resulting from impaired renal tubular function, unspecified: Secondary | ICD-10-CM

## 2013-08-25 NOTE — Progress Notes (Signed)
Pre visit review using our clinic review tool, if applicable. No additional management support is needed unless otherwise documented below in the visit note. 

## 2013-08-25 NOTE — Patient Instructions (Addendum)

## 2013-08-25 NOTE — Progress Notes (Signed)
Subjective:    Patient ID: Brad Brown, male    DOB: 05/24/1945, 68 y.o.   MRN: [id number]  HPI  Pt presents to the clinic today to establish care. He is transferring care from Dr. Lenna Gilford.  COPD: Not on any inhalers.  CAD s/p MI : No current issues. ON metoprolol and zocor  HLD: Last lipid profile 05/2013 showed continue elevated triglycerides despite taking zocor 20 mg day.  DM2: Last A1C 6.9%, on Glyburide.  GERD: Not medicated. Occassional. Takes Pepto Bismol with good relief.  Seizure disorder: No recent seizure activity on Keppra.  HTN: Blood pressure well controlled on Losartan.  Afib: Rate controlled on Metoprolol. On Xarelto for anticoagulation.  Arthritis: Prescribed Mobic. Doesn't take it because he is on xarelto.  CRI: Last creatinine 1.4 (05/2013)  Review of Systems  Past Medical History  Diagnosis Date  . COPD (chronic obstructive pulmonary disease)   . CAD (coronary artery disease)   . 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 Automatic implantable cardiac defibrillator in situ   . Hyperlipidemia   . Borderline diabetes mellitus   . GERD (gastroesophageal reflux disease)   . Colonic polyp 2004    hyperplastic   . History of renal insufficiency syndrome   . Stroke   . Seizure disorder   . Monoclonal gammopathy   . Arthritis   . Atrial fibrillation   . HTN (hypertension)     Current Outpatient Prescriptions  Medication Sig Dispense Refill  . Cellulose (AVICEL PH 105 MICRO CELLULOSE) POWD by Does not apply route as needed. Apply to infected area as needed.      . clotrimazole-betamethasone (LOTRISONE) cream Apply to groin rash 2 times daily  30 g  1  . glyBURIDE (DIABETA) 2.5 MG tablet Take one tablet by mouth daily      . levETIRAcetam (KEPPRA) 500 MG tablet Take one tablet in the morning and two tablets at night      . losartan (COZAAR) 25 MG tablet Take 1 tablet (25 mg total) by mouth daily.  90 tablet  3  . meloxicam (MOBIC)  7.5 MG tablet Take 1 tablet (7.5 mg total) by mouth daily.  90 tablet  1  . metoprolol tartrate (LOPRESSOR) 25 MG tablet Take 1 tab by mouth twice daily  60 tablet  3  . Multiple Vitamin (MULTIVITAMIN) tablet Take 1 tablet by mouth daily.        . naftifine (NAFTIN) 1 % cream Apply to infected area between toes 2 times daily as needed      . predniSONE (STERAPRED UNI-PAK) 5 MG TABS tablet Take as directed  1 each  0  . Rivaroxaban (XARELTO) 20 MG TABS tablet Take 20 mg by mouth daily with supper.      . simvastatin (ZOCOR) 20 MG tablet Take one half tablet by mouth daily at bedtime        No current facility-administered medications for this visit.    Allergies  Allergen Reactions  . Lipitor [Atorvastatin]     REACTION: pt states rash from Lipitor    Family History  Problem Relation Age of Onset  . Heart attack Brother   . Colon cancer Brother   . Heart attack Mother     History   Social History  . Marital Status: Single    Spouse Name: N/A    Number of Children: 1  . Years of Education: N/A   Occupational History  . retired  Social History Main Topics  . Smoking status: Former Smoker -- 0.50 packs/day for 15 years    Types: Cigarettes    Quit date: 03/23/2004  . Smokeless tobacco: Never Used  . Alcohol Use: Yes     Comment: rare--wine  . Drug Use: No  . Sexual Activity: Not on file   Other Topics Concern  . Not on file   Social History Narrative   ICD-St. Jude  Remote-Yes     Constitutional: Denies fever, malaise, fatigue, headache or abrupt weight changes.  HEENT: Denies eye pain, eye redness, ear pain, ringing in the ears, wax buildup, runny nose, nasal congestion, bloody nose, or sore throat. Respiratory: Denies difficulty breathing, shortness of breath, cough or sputum production.   Cardiovascular: Denies chest pain, chest tightness, palpitations or swelling in the hands or feet.  Gastrointestinal: Denies abdominal pain, bloating, constipation, diarrhea  or blood in the stool.  GU: Denies urgency, frequency, pain with urination, burning sensation, blood in urine, odor or discharge. Musculoskeletal: Denies decrease in range of motion, difficulty with gait, muscle pain or joint pain and swelling.  Skin: Denies redness, rashes, lesions or ulcercations.  Neurological: Denies dizziness, difficulty with memory, difficulty with speech or problems with balance and coordination.   No other specific complaints in a complete review of systems (except as listed in HPI above).     Objective:   Physical Exam   BP 134/80  Pulse 64  Temp(Src) 97.5 F (36.4 C) (Oral)  Ht 5\' 9"  (1.753 m)  Wt 203 lb 8 oz (92.307 kg)  BMI 30.04 kg/m2  SpO2 98% Wt Readings from Last 3 Encounters:  08/25/13 203 lb 8 oz (92.307 kg)  08/17/13 209 lb (94.802 kg)  07/25/13 202 lb 12.8 oz (91.989 kg)    General: Appears his stated age, well developed, well nourished in NAD. Skin: Warm, dry and intact. No rashes, lesions or ulcerations noted. HEENT: Head: normal shape and size; Eyes: sclera white, no icterus, conjunctiva pink, PERRLA and EOMs intact; Ears: Tm's gray and intact, normal light reflex; Nose: mucosa pink and moist, septum midline; Throat/Mouth: Teeth present, mucosa pink and moist, no exudate, lesions or ulcerations noted.  Neck: Normal range of motion. Neck supple, trachea midline. No massses, lumps or thyromegaly present.  Cardiovascular: Normal rate with irregular rhythm. No murmur, rubs or gallops noted. No JVD or BLE edema. No carotid bruits noted. Pulmonary/Chest: Normal effort and positive vesicular breath sounds. No respiratory distress. No wheezes, rales or ronchi noted.  Abdomen: Soft and nontender. Normal bowel sounds, no bruits noted. No distention or masses noted. Liver, spleen and kidneys non palpable. Musculoskeletal: Normal range of motion. No signs of joint swelling. No difficulty with gait.  Neurological: Alert and oriented. Cranial nerves II-XII  intact. Coordination normal. +DTRs bilaterally. Psychiatric: Mood and affect normal. Behavior is normal. Judgment and thought content normal.     BMET    Component Value Date/Time   NA 136 06/14/2013 1235   NA 139 01/26/2013 0850   K 4.3 06/14/2013 1235   K 4.2 01/26/2013 0850   CL 104 06/14/2013 1235   CL 104 01/06/2012 1355   CO2 24 06/14/2013 1235   CO2 22 01/26/2013 0850   GLUCOSE 104* 06/14/2013 1235   GLUCOSE 98 01/26/2013 0850   GLUCOSE 77 01/06/2012 1355   BUN 13 06/14/2013 1235   BUN 17.3 01/26/2013 0850   CREATININE 1.4 06/14/2013 1235   CREATININE 1.4* 01/26/2013 0850   CREATININE 1.40* 11/04/2012 1622  CALCIUM 9.6 06/14/2013 1235   CALCIUM 9.7 01/26/2013 0850   GFRNONAA 74.56 02/03/2010 1110   GFRAA 57 02/08/2008 1040    Lipid Panel     Component Value Date/Time   CHOL 159 06/14/2013 1235   TRIG 273.0* 06/14/2013 1235   HDL 47.20 06/14/2013 1235   CHOLHDL 3 06/14/2013 1235   VLDL 54.6* 06/14/2013 1235   LDLCALC 57 06/14/2013 1235    CBC    Component Value Date/Time   WBC 4.8 05/10/2013 1602   WBC 4.5 01/26/2013 0850   RBC 4.59 05/10/2013 1602   RBC 4.73 01/26/2013 0850   HGB 14.2 05/10/2013 1602   HGB 14.3 01/26/2013 0850   HCT 42.4 05/10/2013 1602   HCT 43.2 01/26/2013 0850   PLT 187.0 05/10/2013 1602   PLT 166 01/26/2013 0850   MCV 92.6 05/10/2013 1602   MCV 91.2 01/26/2013 0850   MCH 30.1 01/26/2013 0850   MCH 31.2 11/04/2012 1622   MCHC 33.4 05/10/2013 1602   MCHC 33.0 01/26/2013 0850   RDW 13.0 05/10/2013 1602   RDW 13.1 01/26/2013 0850   LYMPHSABS 1.1 01/26/2013 0850   LYMPHSABS 1.2 11/04/2012 1622   MONOABS 0.3 01/26/2013 0850   MONOABS 0.4 11/04/2012 1622   EOSABS 0.1 01/26/2013 0850   EOSABS 0.1 11/04/2012 1622   BASOSABS 0.1 01/26/2013 0850   BASOSABS 0.0 11/04/2012 1622    Hgb A1C Lab Results  Component Value Date   HGBA1C 6.9* 06/14/2013        Assessment & Plan:   Tdap and pneumovax given today

## 2013-08-27 DIAGNOSIS — M1990 Unspecified osteoarthritis, unspecified site: Secondary | ICD-10-CM | POA: Insufficient documentation

## 2013-08-27 DIAGNOSIS — K219 Gastro-esophageal reflux disease without esophagitis: Secondary | ICD-10-CM | POA: Insufficient documentation

## 2013-08-27 NOTE — Assessment & Plan Note (Signed)
No recent seizure activity of keppra

## 2013-08-27 NOTE — Assessment & Plan Note (Signed)
Elevated triglycerides on zocor 20 mg Add fish oil

## 2013-08-27 NOTE — Assessment & Plan Note (Signed)
Mild Will follow BUN/creatinine

## 2013-08-27 NOTE — Assessment & Plan Note (Signed)
Irregular today but rate controlled on metoprolol On xarelto for anticoagulation

## 2013-08-27 NOTE — Assessment & Plan Note (Signed)
Rare Takes tums prn

## 2013-08-27 NOTE — Assessment & Plan Note (Signed)
Mild No inhalers at this time

## 2013-08-27 NOTE — Assessment & Plan Note (Signed)
Last A1C 6.95 Continue glyburide for now

## 2013-08-27 NOTE — Assessment & Plan Note (Signed)
Triglycerides remain elevated despite being on zocor 20 mg daily Encouraged a low cholesterol diet  Add Fish Oil OTC

## 2013-08-27 NOTE — Assessment & Plan Note (Signed)
Prescribed mobic but does not take it because he is on xarelto Advised tylenol prn

## 2013-08-27 NOTE — Assessment & Plan Note (Signed)
Well controlled on losartan. 

## 2013-10-23 ENCOUNTER — Ambulatory Visit (INDEPENDENT_AMBULATORY_CARE_PROVIDER_SITE_OTHER): Payer: Medicare Other | Admitting: *Deleted

## 2013-10-23 DIAGNOSIS — I4891 Unspecified atrial fibrillation: Secondary | ICD-10-CM

## 2013-10-23 DIAGNOSIS — Q248 Other specified congenital malformations of heart: Secondary | ICD-10-CM

## 2013-10-23 DIAGNOSIS — I498 Other specified cardiac arrhythmias: Secondary | ICD-10-CM

## 2013-10-24 ENCOUNTER — Encounter: Payer: Self-pay | Admitting: Internal Medicine

## 2013-10-24 LAB — MDC_IDC_ENUM_SESS_TYPE_REMOTE
Battery Voltage: 2.56 V
Brady Statistic RV Percent Paced: 1 %
Lead Channel Impedance Value: 400 Ohm
Lead Channel Setting Pacing Pulse Width: 0.5 ms
MDC IDC MSMT BATTERY REMAINING LONGEVITY: 27 mo
MDC IDC MSMT LEADCHNL RV SENSING INTR AMPL: 8.3 mV
MDC IDC PG SERIAL: 449377
MDC IDC SET LEADCHNL RV PACING AMPLITUDE: 2.5 V
MDC IDC SET LEADCHNL RV SENSING SENSITIVITY: 0.3 mV
MDC IDC SET ZONE DETECTION INTERVAL: 250 ms
Zone Setting Detection Interval: 300 ms
Zone Setting Detection Interval: 350 ms

## 2013-10-24 NOTE — Progress Notes (Signed)
Remote ICD transmission.   

## 2013-10-27 ENCOUNTER — Encounter: Payer: Self-pay | Admitting: Cardiology

## 2013-11-09 ENCOUNTER — Ambulatory Visit (INDEPENDENT_AMBULATORY_CARE_PROVIDER_SITE_OTHER): Payer: Medicare Other

## 2013-11-09 DIAGNOSIS — Z5181 Encounter for therapeutic drug level monitoring: Secondary | ICD-10-CM

## 2013-11-09 DIAGNOSIS — I4891 Unspecified atrial fibrillation: Secondary | ICD-10-CM

## 2013-11-09 LAB — BASIC METABOLIC PANEL
BUN: 17 mg/dL (ref 6–23)
CHLORIDE: 104 meq/L (ref 96–112)
CO2: 26 mEq/L (ref 19–32)
Calcium: 9 mg/dL (ref 8.4–10.5)
Creatinine, Ser: 1.5 mg/dL (ref 0.4–1.5)
GFR: 59.79 mL/min — ABNORMAL LOW (ref >60.00)
Glucose, Bld: 69 mg/dL — ABNORMAL LOW (ref 70–99)
Potassium: 3.7 mEq/L (ref 3.5–5.1)
SODIUM: 135 meq/L (ref 135–145)

## 2013-11-09 LAB — CBC
HCT: 40.1 % (ref 39.0–52.0)
Hemoglobin: 13.3 g/dL (ref 13.0–17.0)
MCHC: 33.1 g/dL (ref 30.0–36.0)
MCV: 93 fl (ref 78.0–100.0)
Platelets: 194 10*3/uL (ref 150.0–400.0)
RBC: 4.31 Mil/uL (ref 4.22–5.81)
RDW: 12.9 % (ref 11.5–15.5)
WBC: 6 10*3/uL (ref 4.0–10.5)

## 2013-11-09 NOTE — Progress Notes (Signed)
Pt was started on Xarelto 20mg  QD by Dr Lovena Le for afib on 09/2012.    Reviewed patients medication list.  Pt is not currently on any combined P-gp and strong CYP3A4 inhibitors/inducers (ketoconazole, traconazole, ritonavir, carbamazepine, phenytoin, rifampin, St. John's wort).  Reviewed labs.  SCr 1.5 on 11/09/13, Weight 205 lbs., CrCl- 64.58.  Dose appropriate based on CrCl.   Hgb and HCT Within Normal Limits 13.3/40.1 on 11/09/13.  Continue on same dosage Xarelto 20mg  QD.

## 2013-11-09 NOTE — Patient Instructions (Addendum)
A full discussion of the nature of anticoagulants has been carried out.  A benefit/risk analysis has been presented to the patient, so that they understand the justification for choosing anticoagulation with Xarelto at this time.  The need for compliance is stressed.  Pt is aware to take the medication once daily with the largest meal of the day.  Side effects of potential bleeding are discussed, including unusual colored urine or stools, coughing up blood or coffee ground emesis, nose bleeds or serious fall or head trauma.  Discussed signs and symptoms of stroke. The patient should avoid any OTC items containing aspirin or ibuprofen.  Avoid alcohol consumption.   Call if any signs of abnormal bleeding.  Discussed financial obligations and resolved any difficulty in obtaining medication.  Next lab test test in 6 months.   Called spoke with pt advised to continue on same dosage of Xarelto 20mg  once daily.  Repeat bloodwork in 6 months on 05/10/13 at 2pm.

## 2013-12-01 ENCOUNTER — Ambulatory Visit (INDEPENDENT_AMBULATORY_CARE_PROVIDER_SITE_OTHER): Payer: Medicare Other | Admitting: Internal Medicine

## 2013-12-01 ENCOUNTER — Encounter: Payer: Self-pay | Admitting: Internal Medicine

## 2013-12-01 VITALS — BP 138/86 | HR 73 | Temp 98.3°F | Wt 202.0 lb

## 2013-12-01 DIAGNOSIS — J069 Acute upper respiratory infection, unspecified: Secondary | ICD-10-CM

## 2013-12-01 DIAGNOSIS — R05 Cough: Secondary | ICD-10-CM

## 2013-12-01 DIAGNOSIS — R059 Cough, unspecified: Secondary | ICD-10-CM

## 2013-12-01 NOTE — Patient Instructions (Signed)
Cough, Adult  A cough is a reflex that helps clear your throat and airways. It can help heal the body or may be a reaction to an irritated airway. A cough may only last 2 or 3 weeks (acute) or may last more than 8 weeks (chronic).  CAUSES Acute cough:  Viral or bacterial infections. Chronic cough:  Infections.  Allergies.  Asthma.  Post-nasal drip.  Smoking.  Heartburn or acid reflux.  Some medicines.  Chronic lung problems (COPD).  Cancer. SYMPTOMS   Cough.  Fever.  Chest pain.  Increased breathing rate.  High-pitched whistling sound when breathing (wheezing).  Colored mucus that you cough up (sputum). TREATMENT   A bacterial cough may be treated with antibiotic medicine.  A viral cough must run its course and will not respond to antibiotics.  Your caregiver may recommend other treatments if you have a chronic cough. HOME CARE INSTRUCTIONS   Only take over-the-counter or prescription medicines for pain, discomfort, or fever as directed by your caregiver. Use cough suppressants only as directed by your caregiver.  Use a cold steam vaporizer or humidifier in your bedroom or home to help loosen secretions.  Sleep in a semi-upright position if your cough is worse at night.  Rest as needed.  Stop smoking if you smoke. SEEK IMMEDIATE MEDICAL CARE IF:   You have pus in your sputum.  Your cough starts to worsen.  You cannot control your cough with suppressants and are losing sleep.  You begin coughing up blood.  You have difficulty breathing.  You develop pain which is getting worse or is uncontrolled with medicine.  You have a fever. MAKE SURE YOU:   Understand these instructions.  Will watch your condition.  Will get help right away if you are not doing well or get worse. Document Released: 09/05/2010 Document Revised: 06/01/2011 Document Reviewed: 09/05/2010 Exit[employer] Patient Information 2015 [employer]. This information is not intended  to replace advice given to you by your health care provider. Make sure you discuss any questions you have with your health care provider.  

## 2013-12-01 NOTE — Progress Notes (Signed)
Pre visit review using our clinic review tool, if applicable. No additional management support is needed unless otherwise documented below in the visit note. 

## 2013-12-01 NOTE — Progress Notes (Signed)
HPI  Pt presents to the clinic today with c/o cough and chest congestion. He reports this started 3 days ago. The cough is productive of yellow mucous. He is blowing clear mucous out of his nose. He denies fever, chills or body aches. He has not tried anything OTC. He does have a history of COPD (which he denies). He does not use any inhalers that he is aware of. He does not currently smoke. He has not had sick contacts.  Review of Systems      Past Medical History  Diagnosis Date  . COPD (chronic obstructive pulmonary disease)   . CAD (coronary artery disease)   . 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 Automatic implantable cardiac defibrillator in situ   . Hyperlipidemia   . Borderline diabetes mellitus   . GERD (gastroesophageal reflux disease)   . Colonic polyp 2004    hyperplastic   . History of renal insufficiency syndrome   . Stroke   . Seizure disorder   . Monoclonal gammopathy   . Arthritis   . Atrial fibrillation   . HTN (hypertension)     Family History  Problem Relation Age of Onset  . Heart attack Brother   . Colon cancer Brother   . Heart attack Mother     History   Social History  . Marital Status: Single    Spouse Name: N/A    Number of Children: 1  . Years of Education: N/A   Occupational History  . retired    Social History Main Topics  . Smoking status: Former Smoker -- 0.50 packs/day for 15 years    Types: Cigarettes    Quit date: 03/23/2004  . Smokeless tobacco: Never Used  . Alcohol Use: 0.6 oz/week    1 Glasses of wine per week     Comment: rare--wine  . Drug Use: No  . Sexual Activity: Not on file   Other Topics Concern  . Not on file   Social History Narrative   ICD-St. Jude  Remote-Yes    Allergies  Allergen Reactions  . Lipitor [Atorvastatin]     REACTION: pt states rash from Lipitor     Constitutional:  Denies headache, fatigue, fever or abrupt weight changes.  HEENT:  Positive runny nose. Denies eye  redness, eye pain, pressure behind the eyes, facial pain, nasal congestion, ear pain, ringing in the ears, wax buildup, or bloody nose. Respiratory: Positive cough. Denies difficulty breathing or shortness of breath.  Cardiovascular: Denies chest pain, chest tightness, palpitations or swelling in the hands or feet.   No other specific complaints in a complete review of systems (except as listed in HPI above).  Objective:   BP 138/86  Pulse 73  Temp(Src) 98.3 F (36.8 C) (Oral)  Wt 202 lb (91.627 kg)  SpO2 98%  Wt Readings from Last 3 Encounters:  08/25/13 203 lb 8 oz (92.307 kg)  08/17/13 209 lb (94.802 kg)  07/25/13 202 lb 12.8 oz (91.989 kg)     General: Appears his stated age, well developed, well nourished in NAD. HEENT:  Ears: Tm's gray and intact, normal light reflex; Nose: mucosa pink and moist, septum midline; Throat/Mouth: + PND. Teeth present, mucosa erythematous and moist, no exudate noted, no lesions or ulcerations noted.  Cardiovascular: Normal rate and rhythm. S1,S2 noted.  No murmur, rubs or gallops noted.  Pulmonary/Chest: Normal effort and positive vesicular breath sounds. No respiratory distress. No wheezes, rales or ronchi noted.  Assessment & Plan:   Viral URI with cough:  Get some rest and drink plenty of water Lets try Mucinex and Delsym for a few days He declines RX for Hycodan cough syrup Advised him is symptoms persist or worsen, to call back on Monday and will call in zpack  RTC as needed or if symptoms persist.

## 2013-12-01 NOTE — Progress Notes (Signed)
Subjective:    Patient ID: Brad Brown, male    DOB: 11/04/1945, 68 y.o.   MRN: [id number]  HPI Patient presents with productive cough and rhinnorhea for the past 3 days.  He reports yellow sputum and clear nasal discharge.  He denies fever.  He has not taken anything for relief.  He has been able to sleep.   Review of Systems  Past Medical History  Diagnosis Date  . COPD (chronic obstructive pulmonary disease)   . CAD (coronary artery disease)   . 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 Automatic implantable cardiac defibrillator in situ   . Hyperlipidemia   . Borderline diabetes mellitus   . GERD (gastroesophageal reflux disease)   . Colonic polyp 2004    hyperplastic   . History of renal insufficiency syndrome   . Stroke   . Seizure disorder   . Monoclonal gammopathy   . Arthritis   . Atrial fibrillation   . HTN (hypertension)     Current Outpatient Prescriptions  Medication Sig Dispense Refill  . Cellulose (AVICEL PH 105 MICRO CELLULOSE) POWD by Does not apply route as needed. Apply to infected area as needed.      . clotrimazole-betamethasone (LOTRISONE) cream Apply to groin rash 2 times daily  30 g  1  . glyBURIDE (DIABETA) 2.5 MG tablet Take one tablet by mouth daily      . levETIRAcetam (KEPPRA) 500 MG tablet Take one tablet in the morning and two tablets at night      . losartan (COZAAR) 25 MG tablet Take 1 tablet (25 mg total) by mouth daily.  90 tablet  3  . meloxicam (MOBIC) 7.5 MG tablet Take 1 tablet (7.5 mg total) by mouth daily.  90 tablet  1  . metoprolol tartrate (LOPRESSOR) 25 MG tablet Take 12.5 mg by mouth 2 (two) times daily.      . Multiple Vitamin (MULTIVITAMIN) tablet Take 1 tablet by mouth daily.        . naftifine (NAFTIN) 1 % cream Apply to infected area between toes 2 times daily as needed      . predniSONE (STERAPRED UNI-PAK) 5 MG TABS tablet Take as directed  1 each  0  . Rivaroxaban (XARELTO) 20 MG TABS tablet Take 20  mg by mouth daily with supper.      . simvastatin (ZOCOR) 20 MG tablet Take one half tablet by mouth daily at bedtime        No current facility-administered medications for this visit.    Allergies  Allergen Reactions  . Lipitor [Atorvastatin]     REACTION: pt states rash from Lipitor    Family History  Problem Relation Age of Onset  . Heart attack Brother   . Colon cancer Brother   . Heart attack Mother     History   Social History  . Marital Status: Single    Spouse Name: N/A    Number of Children: 1  . Years of Education: N/A   Occupational History  . retired    Social History Main Topics  . Smoking status: Former Smoker -- 0.50 packs/day for 15 years    Types: Cigarettes    Quit date: 03/23/2004  . Smokeless tobacco: Never Used  . Alcohol Use: 0.6 oz/week    1 Glasses of wine per week     Comment: rare--wine  . Drug Use: No  . Sexual Activity: Not on file   Other Topics Concern  .  Not on file   Social History Narrative   ICD-St. Jude  Remote-Yes     Constitutional: Denies fever, malaise, fatigue, headache or abrupt weight changes.  HEENT: Denies eye pain, eye redness, ear pain, ringing in the ears, wax buildup, nasal congestion, bloody nose, or sore throat. Respiratory: Denies difficulty breathing, shortness of breath   Cardiovascular: Denies chest pain, chest tightness, palpitations or swelling in the hands or feet.   No other specific complaints in a complete review of systems (except as listed in HPI above).     Objective:   Physical Exam  BP 138/86  Pulse 73  Temp(Src) 98.3 F (36.8 C) (Oral)  Wt 202 lb (91.627 kg)  SpO2 98% Wt Readings from Last 3 Encounters:  12/01/13 202 lb (91.627 kg)  08/25/13 203 lb 8 oz (92.307 kg)  08/17/13 209 lb (94.802 kg)    General: Appears his stated age, well developed, well nourished in NAD. HEENT:  Nose: mucosa pink and moist, septum midline; Throat/Mouth: Teeth present, mucosa pink and moist, no  exudate, lesions or ulcerations noted.  Neck:Neck supple, trachea midline. No lymphadenopathy noted. Cardiovascular: Normal rate and rhythm. S1,S2 noted.  No murmur, rubs or gallops noted. Pulmonary/Chest: Normal effort and positive vesicular breath sounds. No respiratory distress. No wheezes, rales or ronchi noted. Negative egophany test  BMET    Component Value Date/Time   NA 135 11/09/2013 1539   NA 139 01/26/2013 0850   K 3.7 11/09/2013 1539   K 4.2 01/26/2013 0850   CL 104 11/09/2013 1539   CL 104 01/06/2012 1355   CO2 26 11/09/2013 1539   CO2 22 01/26/2013 0850   GLUCOSE 69* 11/09/2013 1539   GLUCOSE 98 01/26/2013 0850   GLUCOSE 77 01/06/2012 1355   BUN 17 11/09/2013 1539   BUN 17.3 01/26/2013 0850   CREATININE 1.5 11/09/2013 1539   CREATININE 1.4* 01/26/2013 0850   CREATININE 1.40* 11/04/2012 1622   CALCIUM 9.0 11/09/2013 1539   CALCIUM 9.7 01/26/2013 0850   GFRNONAA 74.56 02/03/2010 1110   GFRAA 57 02/08/2008 1040    Lipid Panel     Component Value Date/Time   CHOL 159 06/14/2013 1235   TRIG 273.0* 06/14/2013 1235   HDL 47.20 06/14/2013 1235   CHOLHDL 3 06/14/2013 1235   VLDL 54.6* 06/14/2013 1235   LDLCALC 57 06/14/2013 1235    CBC    Component Value Date/Time   WBC 6.0 11/09/2013 1539   WBC 4.5 01/26/2013 0850   RBC 4.31 11/09/2013 1539   RBC 4.73 01/26/2013 0850   HGB 13.3 11/09/2013 1539   HGB 14.3 01/26/2013 0850   HCT 40.1 11/09/2013 1539   HCT 43.2 01/26/2013 0850   PLT 194.0 11/09/2013 1539   PLT 166 01/26/2013 0850   MCV 93.0 11/09/2013 1539   MCV 91.2 01/26/2013 0850   MCH 30.1 01/26/2013 0850   MCH 31.2 11/04/2012 1622   MCHC 33.1 11/09/2013 1539   MCHC 33.0 01/26/2013 0850   RDW 12.9 11/09/2013 1539   RDW 13.1 01/26/2013 0850   LYMPHSABS 1.1 01/26/2013 0850   LYMPHSABS 1.2 11/04/2012 1622   MONOABS 0.3 01/26/2013 0850   MONOABS 0.4 11/04/2012 1622   EOSABS 0.1 01/26/2013 0850   EOSABS 0.1 11/04/2012 1622   BASOSABS 0.1 01/26/2013 0850   BASOSABS 0.0 11/04/2012 1622    Hgb  A1C Lab Results  Component Value Date   HGBA1C 6.9* 06/14/2013         Assessment & Plan:  Cough and  rhinnorhea Take Mucinex and Delsym for symptomatic relief, patient did not want Hycodane.   If not feeling better by Monday, call office and we will send a prescription for a Z-pak.

## 2013-12-04 ENCOUNTER — Telehealth: Payer: Self-pay

## 2013-12-04 ENCOUNTER — Telehealth: Payer: Self-pay | Admitting: *Deleted

## 2013-12-04 ENCOUNTER — Other Ambulatory Visit: Payer: Self-pay | Admitting: Internal Medicine

## 2013-12-04 MED ORDER — AZITHROMYCIN 250 MG PO TABS: ORAL_TABLET | ORAL | Status: DC

## 2013-12-04 NOTE — Telephone Encounter (Signed)
[facility] Triage Call Report Triage Record Num: [id number] Operator: Trinna Balloon Patient Name: Brad Brown Call Date & Time: 12/03/2013 10:05:46AM Patient Phone: [phone] PCP: Webb Silversmith Patient Gender: Male PCP Fax : Patient DOB: 09/26/1945 Practice Name: [facility] Reason for Call: Caller: [name]/Patient; PCP: Webb Silversmith; CB#: [phone]; Pt reports he still has a cough after his office visit on 12/01/13 for same. He kindly requests a Z pack called in as he has not improved. Per EPIC he was advised to call for the medication ON 12/04/13. Emergent sx r/o. "Information to office" OFFICE NOTE: PLEASE CALL IN ZPACK AS REQUESTED. Protocol(s) Used: Office Note Recommended Outcome per Protocol: Information Noted and Sent to Office Reason for Outcome: Caller information to office Care Advice: ~ 09/

## 2013-12-04 NOTE — Telephone Encounter (Signed)
z pack sent to pharmacy

## 2013-12-04 NOTE — Telephone Encounter (Signed)
Left message on voicemail.

## 2013-12-04 NOTE — Telephone Encounter (Signed)
Pt left v/m; pt was seen 12/01/13 and if not better by today was to call and request zpak to [facility]. Pt request z pak.

## 2013-12-11 ENCOUNTER — Encounter: Payer: Self-pay | Admitting: Family Medicine

## 2013-12-11 ENCOUNTER — Ambulatory Visit (INDEPENDENT_AMBULATORY_CARE_PROVIDER_SITE_OTHER): Payer: Medicare Other | Admitting: Family Medicine

## 2013-12-11 ENCOUNTER — Telehealth: Payer: Self-pay | Admitting: *Deleted

## 2013-12-11 ENCOUNTER — Ambulatory Visit (INDEPENDENT_AMBULATORY_CARE_PROVIDER_SITE_OTHER)
Admission: RE | Admit: 2013-12-11 | Discharge: 2013-12-11 | Disposition: A | Discharge status: Home / Self Care | Payer: Medicare Other | Source: Ambulatory Visit | Attending: Family Medicine | Admitting: Family Medicine

## 2013-12-11 VITALS — BP 130/84 | HR 55 | Temp 97.6°F | Wt 207.0 lb

## 2013-12-11 DIAGNOSIS — R059 Cough, unspecified: Secondary | ICD-10-CM

## 2013-12-11 DIAGNOSIS — R05 Cough: Secondary | ICD-10-CM

## 2013-12-11 MED ORDER — BENZONATATE 200 MG PO CAPS: 200.0000 mg | ORAL_CAPSULE | Freq: Three times a day (TID) | ORAL | Status: DC | PRN

## 2013-12-11 MED ORDER — DOXYCYCLINE HYCLATE 100 MG PO TABS: 100.0000 mg | ORAL_TABLET | Freq: Two times a day (BID) | ORAL | Status: AC

## 2013-12-11 NOTE — Telephone Encounter (Signed)
[facility] Triage Call Report Triage Record Num: [id number] Operator: Merrilee Seashore Patient Name: Brad Brown Call Date & Time: 12/11/2013 7:00:45AM Patient Phone: [phone] PCP: Webb Silversmith Patient Gender: Male PCP Fax : Patient DOB: 28-Mar-1945 Practice Name: [facility] Reason for Call: Caller: Brad Brown/Patient; PCP: Webb Silversmith; CB#: [phone]; Call regarding cough/night sweats. Onset 12/09/13. c/o cough that induced wheezing. Also c/o night sweats. Waking every night and having to change night shirt due to sweat. He has had a cold, the episode of coughting which occurred this weekend was different, and alarming. Denies SOB, chest pain or anything breathing problems. Afebrile. Triaged per Breathing Problems Guidelines. See Provider within 72 hours for "Intermittent wheezing, shortness of breath or cough and has had viarl illness within the last month. Appointment scheduled for 12/11/13 @ 15:45. Protocol(s) Used: Breathing Problems Recommended Outcome per Protocol: See Provider within 72 Hours Reason for Outcome: Intermittent wheezing, shortness of breath, or cough AND has had viral illness within last month Care Advice: ~ 09/

## 2013-12-11 NOTE — Patient Instructions (Addendum)
Nice to meet you Xrays downstairs today and no news is good news.  Doxycycline twice daily for next 7 days.  Tessalon perles to help with cough up to 2 times daily.  It may take up to 3 weeks for cough to fully go away but you should feel better soon.  If worsening fever or chills please seek medical attention immediately.  Followup with Rollene Fare in next 2 weeks

## 2013-12-11 NOTE — Progress Notes (Signed)
SUBJECTIVE:  Brad Brown is a 68 y.o. male who complains of congestion, swollen glands, post nasal drip, productive cough, bilateral sinus pain and cough almost made him pass outonce  for 14 days patient was seen previously and was diagnosed with a cough as well as an upper respiratory infection. Patient was not improving so was sent in a azithromycin. Patient did take this a regular basis. Patient states that overall he feels somewhat better but continues to have a cough that is significantly productive. Patient states he cannot go longer minutes without coughing. States that he is more but coughing and then have some shortness of breath but is associated with it. Still has some mild fatigue.nervous because in. He denies a history of chest pain, myalgias, vomiting and weight loss and denies a history of asthma. Patient has smoke cigarettes.   OBJECTIVE: He appears well, vital signs are as noted. Ears normal.  Throat and pharynx normal.  Neck supple. No adenopathy in the neck. Nose is congested. Sinuses non tender. The chest is clear, without wheezes or rales.  ASSESSMENT:  bronchitis  PLAN: X-ray ordered today but due to the longevity of this. In addition patient will be given doxycycline for broader coverage. We discussed Tessalon Perles for coughing relieve. Patient is to follow up with primary care provider in 2 weeks if not better.

## 2013-12-22 ENCOUNTER — Ambulatory Visit: Payer: Medicare Other | Admitting: Internal Medicine

## 2013-12-22 DIAGNOSIS — Z0289 Encounter for other administrative examinations: Secondary | ICD-10-CM

## 2014-01-24 ENCOUNTER — Encounter: Payer: Self-pay | Admitting: Internal Medicine

## 2014-01-24 ENCOUNTER — Telehealth: Payer: Self-pay | Admitting: Internal Medicine

## 2014-01-24 ENCOUNTER — Ambulatory Visit (INDEPENDENT_AMBULATORY_CARE_PROVIDER_SITE_OTHER): Payer: Medicare Other | Admitting: *Deleted

## 2014-01-24 DIAGNOSIS — I498 Other specified cardiac arrhythmias: Secondary | ICD-10-CM

## 2014-01-24 DIAGNOSIS — I4891 Unspecified atrial fibrillation: Secondary | ICD-10-CM

## 2014-01-24 DIAGNOSIS — Q248 Other specified congenital malformations of heart: Secondary | ICD-10-CM

## 2014-01-24 LAB — MDC_IDC_ENUM_SESS_TYPE_REMOTE
Battery Voltage: 2.56 V
Brady Statistic RV Percent Paced: 1 %
Date Time Interrogation Session: 20151104132621
HighPow Impedance: 51 Ohm
Lead Channel Impedance Value: 440 Ohm
Lead Channel Pacing Threshold Amplitude: 1 V
Lead Channel Setting Pacing Amplitude: 2.5 V
MDC IDC MSMT BATTERY REMAINING LONGEVITY: 29 mo
MDC IDC MSMT LEADCHNL RV PACING THRESHOLD PULSEWIDTH: 0.5 ms
MDC IDC MSMT LEADCHNL RV SENSING INTR AMPL: 8.7 mV
MDC IDC PG SERIAL: 449377
MDC IDC SET LEADCHNL RV PACING PULSEWIDTH: 0.5 ms
MDC IDC SET LEADCHNL RV SENSING SENSITIVITY: 0.3 mV
MDC IDC SET ZONE DETECTION INTERVAL: 300 ms
Zone Setting Detection Interval: 250 ms
Zone Setting Detection Interval: 350 ms

## 2014-01-24 NOTE — Progress Notes (Signed)
Remote ICD transmission.   

## 2014-01-24 NOTE — Telephone Encounter (Signed)
Spoke with patient and he was aware of ICD shock on 01/05/14 but forgot to call.  Patient states, "It was after a severe bout of indigestion and vomiting."  Patient informed of office visit scheduled 11/13 with Dr. Lovena Le and driving restriction.

## 2014-01-24 NOTE — Telephone Encounter (Signed)
New Message  Pt called to make sure the signal was received. Requests a call back to discuss the results

## 2014-02-02 ENCOUNTER — Ambulatory Visit (INDEPENDENT_AMBULATORY_CARE_PROVIDER_SITE_OTHER): Payer: Medicare Other | Admitting: Internal Medicine

## 2014-02-02 ENCOUNTER — Encounter: Payer: Self-pay | Admitting: Internal Medicine

## 2014-02-02 ENCOUNTER — Other Ambulatory Visit (HOSPITAL_BASED_OUTPATIENT_CLINIC_OR_DEPARTMENT_OTHER): Payer: Medicare Other

## 2014-02-02 VITALS — BP 118/78 | HR 58 | Ht 69.0 in | Wt 204.0 lb

## 2014-02-02 DIAGNOSIS — I498 Other specified cardiac arrhythmias: Secondary | ICD-10-CM

## 2014-02-02 DIAGNOSIS — Q248 Other specified congenital malformations of heart: Secondary | ICD-10-CM

## 2014-02-02 DIAGNOSIS — I480 Paroxysmal atrial fibrillation: Secondary | ICD-10-CM

## 2014-02-02 DIAGNOSIS — D472 Monoclonal gammopathy: Secondary | ICD-10-CM

## 2014-02-02 DIAGNOSIS — Z95810 Presence of automatic (implantable) cardiac defibrillator: Secondary | ICD-10-CM

## 2014-02-02 LAB — MDC_IDC_ENUM_SESS_TYPE_INCLINIC
Brady Statistic RV Percent Paced: 0.01 %
HighPow Impedance: 56.2466
Implantable Pulse Generator Serial Number: 449377
Lead Channel Impedance Value: 487.5 Ohm
Lead Channel Pacing Threshold Amplitude: 1 V
Lead Channel Pacing Threshold Pulse Width: 0.5 ms
Lead Channel Sensing Intrinsic Amplitude: 10.4 mV
Lead Channel Setting Pacing Pulse Width: 0.5 ms
Lead Channel Setting Sensing Sensitivity: 0.3 mV
MDC IDC MSMT BATTERY REMAINING LONGEVITY: 18 mo
MDC IDC MSMT BATTERY VOLTAGE: 2.54 V
MDC IDC MSMT LEADCHNL RV PACING THRESHOLD AMPLITUDE: 1 V
MDC IDC MSMT LEADCHNL RV PACING THRESHOLD PULSEWIDTH: 0.5 ms
MDC IDC SESS DTM: 20151113085332
MDC IDC SET LEADCHNL RV PACING AMPLITUDE: 2.5 V
MDC IDC SET ZONE DETECTION INTERVAL: 300 ms
Zone Setting Detection Interval: 250 ms
Zone Setting Detection Interval: 350 ms

## 2014-02-02 LAB — CBC WITH DIFFERENTIAL/PLATELET
BASO%: 1.9 % (ref 0.0–2.0)
BASOS ABS: 0.1 10*3/uL (ref 0.0–0.1)
EOS%: 1.7 % (ref 0.0–7.0)
Eosinophils Absolute: 0.1 10*3/uL (ref 0.0–0.5)
HCT: 44.7 % (ref 38.4–49.9)
HEMOGLOBIN: 14.5 g/dL (ref 13.0–17.1)
LYMPH#: 1.4 10*3/uL (ref 0.9–3.3)
LYMPH%: 29.3 % (ref 14.0–49.0)
MCH: 29.5 pg (ref 27.2–33.4)
MCHC: 32.6 g/dL (ref 32.0–36.0)
MCV: 90.6 fL (ref 79.3–98.0)
MONO#: 0.3 10*3/uL (ref 0.1–0.9)
MONO%: 5.7 % (ref 0.0–14.0)
NEUT#: 2.8 10*3/uL (ref 1.5–6.5)
NEUT%: 61.4 % (ref 39.0–75.0)
Platelets: 189 10*3/uL (ref 140–400)
RBC: 4.93 10*6/uL (ref 4.20–5.82)
RDW: 13.2 % (ref 11.0–14.6)
WBC: 4.6 10*3/uL (ref 4.0–10.3)

## 2014-02-02 LAB — COMPREHENSIVE METABOLIC PANEL (CC13)
ALT: 28 U/L (ref 0–55)
AST: 17 U/L (ref 5–34)
Albumin: 4.2 g/dL (ref 3.5–5.0)
Alkaline Phosphatase: 69 U/L (ref 40–150)
Anion Gap: 5 mEq/L (ref 3–11)
BUN: 13.1 mg/dL (ref 7.0–26.0)
CALCIUM: 10 mg/dL (ref 8.4–10.4)
CHLORIDE: 107 meq/L (ref 98–109)
CO2: 25 mEq/L (ref 22–29)
CREATININE: 1.4 mg/dL — AB (ref 0.7–1.3)
Glucose: 127 mg/dl (ref 70–140)
Potassium: 4.7 mEq/L (ref 3.5–5.1)
Sodium: 137 mEq/L (ref 136–145)
Total Bilirubin: 0.76 mg/dL (ref 0.20–1.20)
Total Protein: 7.8 g/dL (ref 6.4–8.3)

## 2014-02-02 NOTE — Progress Notes (Signed)
HPI Brad Brown returns today for followup. He is a very pleasant 68 year old man with coronary artery disease, and ischemic cardiomyopathy, PAF, Brugada syndrome, status post ICD implantation. He returns today for followup. The patient denies chest pain. He has class II heart failure symptoms. The patient has done well in the interim. No peripheral edema.  He is back taking Xarelto. His blood pressure has not been well controlled. He had a single ICD shock back in October for what appears to be atrial fib with an RVR.  Allergies  Allergen Reactions  . Lipitor [Atorvastatin]     REACTION: pt states rash from Lipitor     Current Outpatient Prescriptions  Medication Sig Dispense Refill  . Cellulose (AVICEL PH 105 MICRO CELLULOSE) POWD Apply to infected area daily as needed for infection between toes    . clotrimazole-betamethasone (LOTRISONE) cream Apply to groin rash 2 times daily 30 g 1  . glyBURIDE (DIABETA) 2.5 MG tablet Take one tablet by mouth daily    . levETIRAcetam (KEPPRA) 500 MG tablet Take one tablet by mouth in the morning and two tablets by mouth at night    . losartan (COZAAR) 25 MG tablet Take 1 tablet (25 mg total) by mouth daily. 90 tablet 3  . meloxicam (MOBIC) 7.5 MG tablet Take 1 tablet (7.5 mg total) by mouth daily. 90 tablet 1  . metoprolol tartrate (LOPRESSOR) 25 MG tablet Take 12.5 mg by mouth 2 (two) times daily.    . Multiple Vitamin (MULTIVITAMIN) tablet Take 1 tablet by mouth daily.      . naftifine (NAFTIN) 1 % cream Apply 1 application topically 2 (two) times daily as needed (For  infection between toes).     . Rivaroxaban (XARELTO) 20 MG TABS tablet Take 20 mg by mouth daily with supper.    . simvastatin (ZOCOR) 20 MG tablet Take one half tablet by mouth daily at bedtime      No current facility-administered medications for this visit.     Past Medical History  Diagnosis Date  . COPD (chronic obstructive pulmonary disease)   . CAD (coronary artery disease)    . 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 Automatic implantable cardiac defibrillator in situ   . Hyperlipidemia   . Borderline diabetes mellitus   . GERD (gastroesophageal reflux disease)   . Colonic polyp 2004    hyperplastic   . History of renal insufficiency syndrome   . Stroke   . Seizure disorder   . Monoclonal gammopathy   . Arthritis   . Atrial fibrillation   . HTN (hypertension)     ROS:   All systems reviewed and negative except as noted in the HPI.   Past Surgical History  Procedure Laterality Date  . Cardiac catheterization  1987    Showed distal left circumflex 100% occluded   . Cardiac defibrillator placement  08/17/2006    Implantation of a St. Jude single chamber defibrillator  . Inguinal hernia repair Left      Family History  Problem Relation Age of Onset  . Heart attack Brother   . Colon cancer Brother   . Heart attack Mother      History   Social History  . Marital Status: Single    Spouse Name: N/A    Number of Children: 1  . Years of Education: N/A   Occupational History  . retired    Social History Main Topics  . Smoking status: Former Smoker -- 0.50 packs/day  for 15 years    Types: Cigarettes    Quit date: 03/23/2004  . Smokeless tobacco: Never Used  . Alcohol Use: 0.6 oz/week    1 Glasses of wine per week     Comment: rare--wine  . Drug Use: No  . Sexual Activity: Not on file   Other Topics Concern  . Not on file   Social History Narrative   ICD-St. Jude  Remote-Yes     BP 118/78 mmHg  Pulse 58  Ht 5\' 9"  (1.753 m)  Wt 204 lb (92.534 kg)  BMI 30.11 kg/m2  Physical Exam:  Well appearing 68 year old man,NAD HEENT: Unremarkable Neck:  No JVD, no thyromegally Lungs:  Clear with no wheezes, rales, or rhonchi. HEART:  Regular rate rhythm, no murmurs, no rubs, no clicks Abd:  soft, positive bowel sounds, no organomegally, no rebound, no guarding Ext:  2 plus pulses, no edema, no cyanosis, no  clubbing Skin:  No rashes no nodules Neuro:  CN II through XII intact, motor grossly intact   DEVICE  Normal device function.  See PaceArt for details.   Assess/Plan:

## 2014-02-02 NOTE — Assessment & Plan Note (Signed)
He is maintaining NSR. No change in medical therapy.

## 2014-02-02 NOTE — Patient Instructions (Signed)
Remote monitoring is used to monitor your ICD from home. This monitoring reduces the number of office visits required to check your device to one time per year. It allows [place] to keep an eye on the functioning of your device to ensure it is working properly. You are scheduled for a device check from home on 05-07-2014. You may send your transmission at any time that day. If you have a wireless device, the transmission will be sent automatically. After your physician reviews your transmission, you will receive a postcard with your next transmission date.  Your physician recommends that you schedule a follow-up appointment in: 12 months with Dr.Taylor

## 2014-02-02 NOTE — Assessment & Plan Note (Signed)
His St. Jude ICD is working normally. Will recheck in several months.  

## 2014-02-06 LAB — SPEP & IFE WITH QIG
Albumin ELP: 57.1 % (ref 55.8–66.1)
Alpha-1-Globulin: 3.7 % (ref 2.9–4.9)
Alpha-2-Globulin: 8.9 % (ref 7.1–11.8)
BETA 2: 4 % (ref 3.2–6.5)
Beta Globulin: 4.9 % (ref 4.7–7.2)
GAMMA GLOBULIN: 21.4 % — AB (ref 11.1–18.8)
IGA: 58 mg/dL — AB (ref 68–379)
IGG (IMMUNOGLOBIN G), SERUM: 1640 mg/dL — AB (ref 650–1600)
IGM, SERUM: 23 mg/dL — AB (ref 41–251)
M-SPIKE, %: 1.22 g/dL
Total Protein, Serum Electrophoresis: 7.8 g/dL (ref 6.0–8.3)

## 2014-02-06 LAB — KAPPA/LAMBDA LIGHT CHAINS
Kappa free light chain: 7.98 mg/dL — ABNORMAL HIGH (ref 0.33–1.94)
Kappa:Lambda Ratio: 12.87 — ABNORMAL HIGH (ref 0.26–1.65)
Lambda Free Lght Chn: 0.62 mg/dL (ref 0.57–2.63)

## 2014-02-09 ENCOUNTER — Ambulatory Visit (HOSPITAL_BASED_OUTPATIENT_CLINIC_OR_DEPARTMENT_OTHER): Payer: Medicare Other | Admitting: Oncology

## 2014-02-09 ENCOUNTER — Telehealth: Payer: Self-pay | Admitting: Oncology

## 2014-02-09 VITALS — BP 149/79 | HR 57 | Temp 98.0°F | Resp 18 | Ht 69.0 in | Wt 208.5 lb

## 2014-02-09 DIAGNOSIS — D472 Monoclonal gammopathy: Secondary | ICD-10-CM

## 2014-02-09 NOTE — Telephone Encounter (Signed)
Pt confirmed labs/ov per 11/20 POF, gave pt AVS..... KJ  °

## 2014-02-09 NOTE — Progress Notes (Signed)
Hematology and Oncology Follow Up Visit  DEANNA BOEHLKE [id number] July 09, 1945 68 y.o. 02/09/2014 10:39 AM   Principle Diagnosis: This is a 68 year-old gentleman with monoclonal gammopathy of undetermined significance.  He has an IgG kappa subtype without any evidence of end-organ damage. He also has history of atrial fibrillation, cardiac arrhythmias and Brugada syndrome followed by cardiology.  Current therapy: He is under observation and surveillance.  His M-spike continued to be close to 1 g/dL.  Interim History:  Mr. Spiller presents today for a followup visit. Since the last visit, he continues to be asymptomatic. He had not had any hospitalization, had not had any illnesses.  He continued to perform activities of daily living without any hindrance or decline. He is on on Xarelto for cardiac indications and have done relatively well with it. He had noticed some occasional bleeding gums. He does not report any headaches or blurry vision or syncope. He does not report any fevers, chills, sweats or weight loss. He does not report any chest pain, palpitation or orthopnea. He does not report leg edema cough or shortness of breath. He does not report any nausea, vomiting or difficulty eating. He does not report any lymphadenopathy or petechiae. He does not report any urinary symptoms such as dysuria or hematuria and the rest of his review of systems unremarkable.  Medications: I have reviewed the patient's current medications.  Current Outpatient Prescriptions  Medication Sig Dispense Refill  . Cellulose (AVICEL PH 105 MICRO CELLULOSE) POWD Apply to infected area daily as needed for infection between toes    . clotrimazole-betamethasone (LOTRISONE) cream Apply to groin rash 2 times daily 30 g 1  . glyBURIDE (DIABETA) 2.5 MG tablet Take one tablet by mouth daily    . levETIRAcetam (KEPPRA) 500 MG tablet Take one tablet by mouth in the morning and two tablets by mouth at night    . losartan  (COZAAR) 25 MG tablet Take 1 tablet (25 mg total) by mouth daily. 90 tablet 3  . meloxicam (MOBIC) 7.5 MG tablet Take 1 tablet (7.5 mg total) by mouth daily. 90 tablet 1  . metoprolol tartrate (LOPRESSOR) 25 MG tablet Take 12.5 mg by mouth 2 (two) times daily.    . Multiple Vitamin (MULTIVITAMIN) tablet Take 1 tablet by mouth daily.      . naftifine (NAFTIN) 1 % cream Apply 1 application topically 2 (two) times daily as needed (For  infection between toes).     . Rivaroxaban (XARELTO) 20 MG TABS tablet Take 20 mg by mouth daily with supper.    . simvastatin (ZOCOR) 20 MG tablet Take one half tablet by mouth daily at bedtime      No current facility-administered medications for this visit.    Allergies:  Allergies  Allergen Reactions  . Lipitor [Atorvastatin]     REACTION: pt states rash from Lipitor    Past Medical History, Surgical history, Social history, and Family History were reviewed and updated.  Physical Exam: Blood pressure 149/79, pulse 57, temperature 98 F (36.7 C), temperature source Oral, resp. rate 18, height 5' 9" (1.753 m), weight 208 lb 8 oz (94.575 kg), SpO2 100 %. ECOG: 1 General appearance: alert Head: Normocephalic, without obvious abnormality Neck: no adenopathy Lymph nodes: Cervical, supraclavicular, and axillary nodes normal. Heart:regular rate and rhythm, S1, S2 normal, no murmur, click, rub or gallop Lung:chest clear, no wheezing, rales, normal symmetric air entry Abdomin: soft, non-tender, without masses or organomegaly EXT:no erythema, induration, or nodules  Lab Results: Lab Results  Component Value Date   WBC 4.6 02/02/2014   HGB 14.5 02/02/2014   HCT 44.7 02/02/2014   MCV 90.6 02/02/2014   PLT 189 02/02/2014     Chemistry      Component Value Date/Time   NA 137 02/02/2014 1009   NA 135 11/09/2013 1539   K 4.7 02/02/2014 1009   K 3.7 11/09/2013 1539   CL 104 11/09/2013 1539   CL 104 01/06/2012 1355   CO2 25 02/02/2014 1009   CO2 26  11/09/2013 1539   BUN 13.1 02/02/2014 1009   BUN 17 11/09/2013 1539   CREATININE 1.4* 02/02/2014 1009   CREATININE 1.5 11/09/2013 1539   CREATININE 1.40* 11/04/2012 1622      Component Value Date/Time   CALCIUM 10.0 02/02/2014 1009   CALCIUM 9.0 11/09/2013 1539   ALKPHOS 69 02/02/2014 1009   ALKPHOS 44 08/05/2011 1054   AST 17 02/02/2014 1009   AST 22 08/05/2011 1054   ALT 28 02/02/2014 1009   ALT 28 08/05/2011 1054   BILITOT 0.76 02/02/2014 1009   BILITOT 1.3* 08/05/2011 1054      Results for Reichel, Loman W (MRN [id number]) as of 02/09/2014 10:23  Ref. Range 01/06/2012 00:00 01/26/2013 08:50 02/02/2014 10:09  M-SPIKE, % No range found 0.97 1.03 1.22  SPE Interp. No range found * * *  IgG (Immunoglobin G), Serum Latest Range: 650-1600 mg/dL 1380 1620 (H) 1640 (H)       Impression and Plan:  A 68-year-old gentleman with the following issues:  1. IgG kappa monoclonal gammopathy of undetermined significance without any evidence of end-organ damage to suggest multiple myeloma.  Laboratory data from 02/02/2014 were reviewed today in continued to show no major changes in his M spike or his quantitative immunoglobulins. The plan is to continue with active surveillance and repeat protein studies in 12 months. His any major changes noted we will restage him with a skeletal survey and a bone marrow biopsy. 2. Seizure disorder.  He occasionally has had seizure activities, none recently for the time being. 3.     Brugada syndrome:  followed by cardiology.    SHADAD,FIRAS, MD 11/20/201510:39 AM  

## 2014-02-27 ENCOUNTER — Ambulatory Visit (INDEPENDENT_AMBULATORY_CARE_PROVIDER_SITE_OTHER): Payer: Medicare Other | Admitting: Internal Medicine

## 2014-02-27 ENCOUNTER — Encounter: Payer: Self-pay | Admitting: Internal Medicine

## 2014-02-27 VITALS — BP 130/82 | HR 58 | Temp 97.7°F | Wt 205.0 lb

## 2014-02-27 DIAGNOSIS — E785 Hyperlipidemia, unspecified: Secondary | ICD-10-CM

## 2014-02-27 DIAGNOSIS — I480 Paroxysmal atrial fibrillation: Secondary | ICD-10-CM

## 2014-02-27 DIAGNOSIS — I10 Essential (primary) hypertension: Secondary | ICD-10-CM

## 2014-02-27 DIAGNOSIS — J449 Chronic obstructive pulmonary disease, unspecified: Secondary | ICD-10-CM

## 2014-02-27 DIAGNOSIS — R569 Unspecified convulsions: Secondary | ICD-10-CM

## 2014-02-27 DIAGNOSIS — K219 Gastro-esophageal reflux disease without esophagitis: Secondary | ICD-10-CM

## 2014-02-27 DIAGNOSIS — M1990 Unspecified osteoarthritis, unspecified site: Secondary | ICD-10-CM

## 2014-02-27 DIAGNOSIS — E119 Type 2 diabetes mellitus without complications: Secondary | ICD-10-CM

## 2014-02-27 DIAGNOSIS — I25119 Atherosclerotic heart disease of native coronary artery with unspecified angina pectoris: Secondary | ICD-10-CM

## 2014-02-27 DIAGNOSIS — D472 Monoclonal gammopathy: Secondary | ICD-10-CM

## 2014-02-27 LAB — LIPID PANEL
Cholesterol: 150 mg/dL (ref 0–200)
HDL: 35.9 mg/dL — ABNORMAL LOW (ref >39.00)
NONHDL: 114.1
Total CHOL/HDL Ratio: 4
Triglycerides: 310 mg/dL — ABNORMAL HIGH (ref 0.0–149.0)
VLDL: 62 mg/dL — AB (ref 0.0–40.0)

## 2014-02-27 LAB — LDL CHOLESTEROL, DIRECT: Direct LDL: 44.8 mg/dL

## 2014-02-27 LAB — HEMOGLOBIN A1C: HEMOGLOBIN A1C: 7.7 % — AB (ref 4.6–6.5)

## 2014-02-27 NOTE — Assessment & Plan Note (Signed)
Will check A1C today Foot exam today Flu and tetanus UTD Eye exam UTD Encouraged him to work on diet and exercise

## 2014-02-27 NOTE — Assessment & Plan Note (Signed)
Has RX for mobic but doesn't take it due to being on xarelto Will use tylenol when needed

## 2014-02-27 NOTE — Patient Instructions (Signed)
Fat and Cholesterol Control Diet Fat and cholesterol levels in your blood and organs are influenced by your diet. High levels of fat and cholesterol may lead to diseases of the heart, small and large blood vessels, gallbladder, liver, and pancreas. CONTROLLING FAT AND CHOLESTEROL WITH DIET Although exercise and lifestyle factors are important, your diet is key. That is because certain foods are known to raise cholesterol and others to lower it. The goal is to balance foods for their effect on cholesterol and more importantly, to replace saturated and trans fat with other types of fat, such as monounsaturated fat, polyunsaturated fat, and omega-3 fatty acids. On average, a person should consume no more than 15 to 17 g of saturated fat daily. Saturated and trans fats are considered "bad" fats, and they will raise LDL cholesterol. Saturated fats are primarily found in animal products such as meats, butter, and cream. However, that does not mean you need to give up all your favorite foods. Today, there are good tasting, low-fat, low-cholesterol substitutes for most of the things you like to eat. Choose low-fat or nonfat alternatives. Choose round or loin cuts of red meat. These types of cuts are lowest in fat and cholesterol. Chicken (without the skin), fish, veal, and ground turkey breast are great choices. Eliminate fatty meats, such as hot dogs and salami. Even shellfish have little or no saturated fat. Have a 3 oz (85 g) portion when you eat lean meat, poultry, or fish. Trans fats are also called "partially hydrogenated oils." They are oils that have been scientifically manipulated so that they are solid at room temperature resulting in a longer shelf life and improved taste and texture of foods in which they are added. Trans fats are found in stick margarine, some tub margarines, cookies, crackers, and baked goods.  When baking and cooking, oils are a great substitute for butter. The monounsaturated oils are  especially beneficial since it is believed they lower LDL and raise HDL. The oils you should avoid entirely are saturated tropical oils, such as coconut and palm.  Remember to eat a lot from food groups that are naturally free of saturated and trans fat, including fish, fruit, vegetables, beans, grains (barley, rice, couscous, bulgur wheat), and pasta (without cream sauces).  IDENTIFYING FOODS THAT LOWER FAT AND CHOLESTEROL  Soluble fiber may lower your cholesterol. This type of fiber is found in fruits such as apples, vegetables such as broccoli, potatoes, and carrots, legumes such as beans, peas, and lentils, and grains such as barley. Foods fortified with plant sterols (phytosterol) may also lower cholesterol. You should eat at least 2 g per day of these foods for a cholesterol lowering effect.  Read package labels to identify low-saturated fats, trans fat free, and low-fat foods at the supermarket. Select cheeses that have only 2 to 3 g saturated fat per ounce. Use a heart-healthy tub margarine that is free of trans fats or partially hydrogenated oil. When buying baked goods (cookies, crackers), avoid partially hydrogenated oils. Breads and muffins should be made from whole grains (whole-wheat or whole oat flour, instead of "flour" or "enriched flour"). Buy non-creamy canned soups with reduced salt and no added fats.  FOOD PREPARATION TECHNIQUES  Never deep-fry. If you must fry, either stir-fry, which uses very little fat, or use non-stick cooking sprays. When possible, broil, bake, or roast meats, and steam vegetables. Instead of putting butter or margarine on vegetables, use lemon and herbs, applesauce, and cinnamon (for squash and sweet potatoes). Use nonfat   yogurt, salsa, and low-fat dressings for salads.  LOW-SATURATED FAT / LOW-FAT FOOD SUBSTITUTES Meats / Saturated Fat (g)  Avoid: Steak, marbled (3 oz/85 g) / 11 g  Choose: Steak, lean (3 oz/85 g) / 4 g  Avoid: Hamburger (3 oz/85 g) / 7  g  Choose: Hamburger, lean (3 oz/85 g) / 5 g  Avoid: Ham (3 oz/85 g) / 6 g  Choose: Ham, lean cut (3 oz/85 g) / 2.4 g  Avoid: Chicken, with skin, dark meat (3 oz/85 g) / 4 g  Choose: Chicken, skin removed, dark meat (3 oz/85 g) / 2 g  Avoid: Chicken, with skin, light meat (3 oz/85 g) / 2.5 g  Choose: Chicken, skin removed, light meat (3 oz/85 g) / 1 g Dairy / Saturated Fat (g)  Avoid: Whole milk (1 cup) / 5 g  Choose: Low-fat milk, 2% (1 cup) / 3 g  Choose: Low-fat milk, 1% (1 cup) / 1.5 g  Choose: Skim milk (1 cup) / 0.3 g  Avoid: Hard cheese (1 oz/28 g) / 6 g  Choose: Skim milk cheese (1 oz/28 g) / 2 to 3 g  Avoid: Cottage cheese, 4% fat (1 cup) / 6.5 g  Choose: Low-fat cottage cheese, 1% fat (1 cup) / 1.5 g  Avoid: Ice cream (1 cup) / 9 g  Choose: Sherbet (1 cup) / 2.5 g  Choose: Nonfat frozen yogurt (1 cup) / 0.3 g  Choose: Frozen fruit bar / trace  Avoid: Whipped cream (1 tbs) / 3.5 g  Choose: Nondairy whipped topping (1 tbs) / 1 g Condiments / Saturated Fat (g)  Avoid: Mayonnaise (1 tbs) / 2 g  Choose: Low-fat mayonnaise (1 tbs) / 1 g  Avoid: Butter (1 tbs) / 7 g  Choose: Extra light margarine (1 tbs) / 1 g  Avoid: Coconut oil (1 tbs) / 11.8 g  Choose: Olive oil (1 tbs) / 1.8 g  Choose: Corn oil (1 tbs) / 1.7 g  Choose: Safflower oil (1 tbs) / 1.2 g  Choose: Sunflower oil (1 tbs) / 1.4 g  Choose: Soybean oil (1 tbs) / 2.4 g  Choose: Canola oil (1 tbs) / 1 g Document Released: 03/09/2005 Document Revised: 07/04/2012 Document Reviewed: 06/07/2013 [employer] Patient Information 2015 [employer]. This information is not intended to replace advice given to you by your health care provider. Make sure you discuss any questions you have with your health care provider.  

## 2014-02-27 NOTE — Assessment & Plan Note (Signed)
Denies myalgias on zocor Will check CBC and CMET today Handout given on lowfat diet

## 2014-02-27 NOTE — Progress Notes (Signed)
Subjective:    Patient ID: Brad Brown, male    DOB: Sep 12, 1945, 68 y.o.   MRN: [id number]  HPI  Pt presents to the clinic today to for 6 month followup.  COPD: Not on any inhalers. He is a former smoker.  CAD s/p MI : No current issues. On metoprolol and zocor. He is following a low fat diet. He does have and ICD that recently fired due to nausea and vomiting. He is following with cardiology. Note from 01/2014 reviewed.  HLD: Last lipid profile 05/2013 showed continue elevated triglycerides despite taking zocor 20 mg day.  DM2: Last A1C 6.9%, on Glyburide. He does not check his blood sugars. He has not had any hypoglycemia that he is aware of. He last eye exam was 01/2014. Flu shot at [facility] 12/2013. Pneumonia shot 08/2013.  GERD: Not medicated. Occassional but none in the last 6 weeks. Takes Pepto Bismol with good relief.  Seizure disorder: No recent seizure activity on Keppra.  HTN: Blood pressure well controlled on Losartan.  Afib: Rate controlled on Metoprolol. On Xarelto for anticoagulation.  Arthritis: Better lately. Prescribed Mobic. Doesn't take it because he is on xarelto.  CRI: Last creatinine 1.4 (05/2013). 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 Note from 02/09/14 reviewed.  Review of Systems  Past Medical History  Diagnosis Date  . COPD (chronic obstructive pulmonary disease)   . CAD (coronary artery disease)   . 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 Automatic implantable cardiac defibrillator in situ   . Hyperlipidemia   . Borderline diabetes mellitus   . GERD (gastroesophageal reflux disease)   . Colonic polyp 2004    hyperplastic   . History of renal insufficiency syndrome   . Stroke   . Seizure disorder   . Monoclonal gammopathy   . Arthritis   . Atrial fibrillation   . HTN (hypertension)     Current Outpatient Prescriptions  Medication Sig Dispense Refill  . Cellulose (AVICEL PH 105 MICRO CELLULOSE) POWD  Apply to infected area daily as needed for infection between toes    . clotrimazole-betamethasone (LOTRISONE) cream Apply to groin rash 2 times daily 30 g 1  . glyBURIDE (DIABETA) 2.5 MG tablet Take one tablet by mouth daily    . levETIRAcetam (KEPPRA) 500 MG tablet Take one tablet by mouth in the morning and two tablets by mouth at night    . losartan (COZAAR) 25 MG tablet Take 1 tablet (25 mg total) by mouth daily. 90 tablet 3  . meloxicam (MOBIC) 7.5 MG tablet Take 1 tablet (7.5 mg total) by mouth daily. 90 tablet 1  . metoprolol tartrate (LOPRESSOR) 25 MG tablet Take 12.5 mg by mouth 2 (two) times daily.    . Multiple Vitamin (MULTIVITAMIN) tablet Take 1 tablet by mouth daily.      . naftifine (NAFTIN) 1 % cream Apply 1 application topically 2 (two) times daily as needed (For  infection between toes).     . Rivaroxaban (XARELTO) 20 MG TABS tablet Take 20 mg by mouth daily with supper.    . simvastatin (ZOCOR) 20 MG tablet Take one half tablet by mouth daily at bedtime      No current facility-administered medications for this visit.    Allergies  Allergen Reactions  . Lipitor [Atorvastatin]     REACTION: pt states rash from Lipitor    Family History  Problem Relation Age of Onset  . Heart attack Brother   . Colon cancer  Brother   . Heart attack Mother     History   Social History  . Marital Status: Single    Spouse Name: N/A    Number of Children: 1  . Years of Education: N/A   Occupational History  . retired    Social History Main Topics  . Smoking status: Former Smoker -- 0.50 packs/day for 15 years    Types: Cigarettes    Quit date: 03/23/2004  . Smokeless tobacco: Never Used  . Alcohol Use: 0.6 oz/week    1 Glasses of wine per week     Comment: rare--wine  . Drug Use: No  . Sexual Activity: Not on file   Other Topics Concern  . Not on file   Social History Narrative   ICD-St. Jude  Remote-Yes     Constitutional: Denies fever, malaise, fatigue,  headache or abrupt weight changes.  Respiratory: Denies difficulty breathing, shortness of breath, cough or sputum production.   Cardiovascular: Denies chest pain, chest tightness, palpitations or swelling in the hands or feet.  Gastrointestinal: Pt reports occasional nausea. Denies abdominal pain, bloating, constipation, diarrhea or blood in the stool.  Musculoskeletal: Pt reports occasional joint pain. Denies decrease in range of motion, difficulty with gait, muscle pain or joint swelling.  Skin: Pt reports a lesion on the right side of his face. Denies redness, rashes or ulcercations.  Neurological: Denies dizziness, difficulty with memory, difficulty with speech or problems with balance and coordination.   No other specific complaints in a complete review of systems (except as listed in HPI above).     Objective:   Physical Exam   BP 130/82 mmHg  Pulse 58  Temp(Src) 97.7 F (36.5 C) (Oral)  Wt 205 lb (92.987 kg)  SpO2 98% Wt Readings from Last 3 Encounters:  02/27/14 205 lb (92.987 kg)  02/09/14 208 lb 8 oz (94.575 kg)  02/02/14 204 lb (92.534 kg)    General: Appears his stated age, obese but in NAD. Skin: Warm, dry and intact. > 1 cm round skin colored lesion noted on right face under the eye with cental darkening. HEENT: Head: normal shape and size; Ears: Tm's gray and intact, normal light reflex; Nose: mucosa pink and moist, septum midline; Throat/Mouth: Teeth present, mucosa pink and moist, no exudate, lesions or ulcerations noted.  Neck:  Neck supple, trachea midline. No masses, lumps or thyromegaly present.  Cardiovascular: Normal rate with irregular rhythm. No murmur, rubs or gallops noted. No JVD or BLE edema. No carotid bruits noted. Pulmonary/Chest: Normal effort and positive vesicular breath sounds. No respiratory distress. No wheezes, rales or ronchi noted.  Abdomen: Soft and nontender. Normal bowel sounds, no bruits noted. No distention or masses noted. Liver, spleen  and kidneys non palpable. Musculoskeletal: Normal range of motion. Strength 5/5 BUE/BLE.  No difficulty with gait.  Neurological: Alert and oriented. Psychiatric: Mood and affect normal. Behavior is normal. Judgment and thought content normal.     BMET    Component Value Date/Time   NA 137 02/02/2014 1009   NA 135 11/09/2013 1539   K 4.7 02/02/2014 1009   K 3.7 11/09/2013 1539   CL 104 11/09/2013 1539   CL 104 01/06/2012 1355   CO2 25 02/02/2014 1009   CO2 26 11/09/2013 1539   GLUCOSE 127 02/02/2014 1009   GLUCOSE 69* 11/09/2013 1539   GLUCOSE 77 01/06/2012 1355   BUN 13.1 02/02/2014 1009   BUN 17 11/09/2013 1539   CREATININE 1.4* 02/02/2014 1009  CREATININE 1.5 11/09/2013 1539   CREATININE 1.40* 11/04/2012 1622   CALCIUM 10.0 02/02/2014 1009   CALCIUM 9.0 11/09/2013 1539   GFRNONAA 74.56 02/03/2010 1110   GFRAA 57 02/08/2008 1040    Lipid Panel     Component Value Date/Time   CHOL 159 06/14/2013 1235   TRIG 273.0* 06/14/2013 1235   HDL 47.20 06/14/2013 1235   CHOLHDL 3 06/14/2013 1235   VLDL 54.6* 06/14/2013 1235   LDLCALC 57 06/14/2013 1235    CBC    Component Value Date/Time   WBC 4.6 02/02/2014 1009   WBC 6.0 11/09/2013 1539   RBC 4.93 02/02/2014 1009   RBC 4.31 11/09/2013 1539   HGB 14.5 02/02/2014 1009   HGB 13.3 11/09/2013 1539   HCT 44.7 02/02/2014 1009   HCT 40.1 11/09/2013 1539   PLT 189 02/02/2014 1009   PLT 194.0 11/09/2013 1539   MCV 90.6 02/02/2014 1009   MCV 93.0 11/09/2013 1539   MCH 29.5 02/02/2014 1009   MCH 31.2 11/04/2012 1622   MCHC 32.6 02/02/2014 1009   MCHC 33.1 11/09/2013 1539   RDW 13.2 02/02/2014 1009   RDW 12.9 11/09/2013 1539   LYMPHSABS 1.4 02/02/2014 1009   LYMPHSABS 1.2 11/04/2012 1622   MONOABS 0.3 02/02/2014 1009   MONOABS 0.4 11/04/2012 1622   EOSABS 0.1 02/02/2014 1009   EOSABS 0.1 11/04/2012 1622   BASOSABS 0.1 02/02/2014 1009   BASOSABS 0.0 11/04/2012 1622    Hgb A1C Lab Results  Component Value Date    HGBA1C 6.9* 06/14/2013        Assessment & Plan:

## 2014-02-27 NOTE — Assessment & Plan Note (Signed)
On metoprolol and statin Follows with cardiology  Will check lipid profile today

## 2014-02-27 NOTE — Assessment & Plan Note (Signed)
Being followed by medical oncology

## 2014-02-27 NOTE — Assessment & Plan Note (Signed)
Mild Lungs clear on exam today No inhalers He no longer smokes

## 2014-02-27 NOTE — Progress Notes (Signed)
Pre visit review using our clinic review tool, if applicable. No additional management support is needed unless otherwise documented below in the visit note. 

## 2014-02-27 NOTE — Assessment & Plan Note (Signed)
Well controlled on Losartan Will check CBC and CMET today

## 2014-02-27 NOTE — Assessment & Plan Note (Signed)
Occasional Uses pepto bismol prn

## 2014-02-27 NOTE — Assessment & Plan Note (Signed)
Rate controlled on metoprolol On xarelto for anticoagulation Will check CBC and CMET today

## 2014-02-27 NOTE — Assessment & Plan Note (Signed)
No recent seizures on keppra Will check CMET today

## 2014-02-28 ENCOUNTER — Other Ambulatory Visit: Payer: Self-pay | Admitting: Internal Medicine

## 2014-02-28 MED ORDER — GLYBURIDE 5 MG PO TABS: 5.0000 mg | ORAL_TABLET | Freq: Every day | ORAL | Status: DC

## 2014-02-28 MED ORDER — NIACIN (ANTIHYPERLIPIDEMIC) 500 MG PO TABS: 500.0000 mg | ORAL_TABLET | Freq: Every day | ORAL | Status: DC

## 2014-03-01 ENCOUNTER — Telehealth: Payer: Self-pay | Admitting: Internal Medicine

## 2014-03-01 NOTE — Telephone Encounter (Signed)
emmi mailed  °

## 2014-03-09 ENCOUNTER — Telehealth: Payer: Self-pay | Admitting: Internal Medicine

## 2014-03-09 NOTE — Telephone Encounter (Signed)
Patient returned your call.  Patient requesting you call him back on his cell phone.

## 2014-03-19 ENCOUNTER — Telehealth: Payer: Self-pay | Admitting: Internal Medicine

## 2014-03-19 NOTE — Telephone Encounter (Signed)
Pt came in today wanting to discuss him medication He had several questions

## 2014-03-19 NOTE — Telephone Encounter (Signed)
Spoke to pt in office and gave written instructions

## 2014-03-19 NOTE — Telephone Encounter (Signed)
Spoke to pt in office and he was given the instructions in writing

## 2014-03-22 ENCOUNTER — Other Ambulatory Visit: Payer: Self-pay

## 2014-03-22 DIAGNOSIS — E119 Type 2 diabetes mellitus without complications: Secondary | ICD-10-CM

## 2014-03-22 MED ORDER — GLIPIZIDE 5 MG PO TABS: 5.0000 mg | ORAL_TABLET | Freq: Two times a day (BID) | ORAL | Status: DC

## 2014-03-28 DIAGNOSIS — N5201 Erectile dysfunction due to arterial insufficiency: Secondary | ICD-10-CM | POA: Diagnosis not present

## 2014-05-07 ENCOUNTER — Telehealth: Payer: Self-pay | Admitting: Internal Medicine

## 2014-05-07 ENCOUNTER — Ambulatory Visit (INDEPENDENT_AMBULATORY_CARE_PROVIDER_SITE_OTHER): Payer: Medicare Other | Admitting: *Deleted

## 2014-05-07 ENCOUNTER — Encounter: Payer: Self-pay | Admitting: Internal Medicine

## 2014-05-07 DIAGNOSIS — Q248 Other specified congenital malformations of heart: Secondary | ICD-10-CM | POA: Diagnosis not present

## 2014-05-07 DIAGNOSIS — I498 Other specified cardiac arrhythmias: Secondary | ICD-10-CM

## 2014-05-07 LAB — MDC_IDC_ENUM_SESS_TYPE_REMOTE
HIGH POWER IMPEDANCE MEASURED VALUE: 49 Ohm
HIGH POWER IMPEDANCE MEASURED VALUE: 56 Ohm
Implantable Pulse Generator Serial Number: 449377
Lead Channel Impedance Value: 490 Ohm
Lead Channel Pacing Threshold Pulse Width: 0.5 ms
Lead Channel Setting Pacing Amplitude: 2.5 V
Lead Channel Setting Pacing Pulse Width: 0.5 ms
MDC IDC MSMT BATTERY REMAINING LONGEVITY: 29 mo
MDC IDC MSMT BATTERY VOLTAGE: 2.56 V
MDC IDC MSMT LEADCHNL RV PACING THRESHOLD AMPLITUDE: 1 V
MDC IDC MSMT LEADCHNL RV SENSING INTR AMPL: 11.6 mV
MDC IDC SESS DTM: 20160215115931
MDC IDC SET LEADCHNL RV SENSING SENSITIVITY: 0.3 mV
MDC IDC STAT BRADY RV PERCENT PACED: 1 %
Zone Setting Detection Interval: 250 ms
Zone Setting Detection Interval: 300 ms
Zone Setting Detection Interval: 350 ms

## 2014-05-07 NOTE — Telephone Encounter (Signed)
Informed pt that transmission was received.  

## 2014-05-07 NOTE — Progress Notes (Signed)
Remote ICD transmission.   

## 2014-05-07 NOTE — Telephone Encounter (Signed)
New Msg         Pt calling to see if remote check was successful.    Please return pt call.

## 2014-05-08 ENCOUNTER — Ambulatory Visit (INDEPENDENT_AMBULATORY_CARE_PROVIDER_SITE_OTHER): Payer: Medicare Other | Admitting: *Deleted

## 2014-05-08 DIAGNOSIS — Z5181 Encounter for therapeutic drug level monitoring: Secondary | ICD-10-CM | POA: Diagnosis not present

## 2014-05-08 DIAGNOSIS — I4891 Unspecified atrial fibrillation: Secondary | ICD-10-CM

## 2014-05-08 LAB — CBC
HEMATOCRIT: 41.2 % (ref 39.0–52.0)
HEMOGLOBIN: 14.1 g/dL (ref 13.0–17.0)
MCHC: 34.1 g/dL (ref 30.0–36.0)
MCV: 88.8 fl (ref 78.0–100.0)
PLATELETS: 179 10*3/uL (ref 150.0–400.0)
RBC: 4.64 Mil/uL (ref 4.22–5.81)
RDW: 13.2 % (ref 11.5–15.5)
WBC: 5.3 10*3/uL (ref 4.0–10.5)

## 2014-05-08 LAB — BASIC METABOLIC PANEL
BUN: 16 mg/dL (ref 6–23)
CHLORIDE: 105 meq/L (ref 96–112)
CO2: 29 mEq/L (ref 19–32)
Calcium: 9.2 mg/dL (ref 8.4–10.5)
Creatinine, Ser: 1.24 mg/dL (ref 0.40–1.50)
GFR: 74.93 mL/min (ref >60.00)
GLUCOSE: 67 mg/dL — AB (ref 70–99)
POTASSIUM: 4.2 meq/L (ref 3.5–5.1)
Sodium: 138 mEq/L (ref 135–145)

## 2014-05-08 NOTE — Progress Notes (Signed)
Pt was started on Xarelto 20 mg daily for Atrial Fib on July 2014.    Reviewed patients medication list.  Pt is not  currently on any combined P-gp and strong CYP3A4 inhibitors/inducers (ketoconazole, traconazole, ritonavir, carbamazepine, phenytoin, rifampin, St. John's wort).  Reviewed labs.  SCr 1.24 , Weight 92.45kg, CrCl 76.627.  Dose is appropriate based on CrCl. 76.627   Hgb 14.1 and HCT 41.2  A full discussion of the nature of anticoagulants has been carried out.  A benefit/risk analysis has been presented to the patient, so that they understand the justification for choosing anticoagulation with Xarelto at this time.  The need for compliance is stressed.  Pt is aware to take the medication once daily with the largest meal of the day.  Side effects of potential bleeding are discussed, including unusual colored urine or stools, coughing up blood or coffee ground emesis, nose bleeds or serious fall or head trauma.  Discussed signs and symptoms of stroke. The patient should avoid any OTC items containing aspirin or ibuprofen.  Avoid alcohol consumption.   Call if any signs of abnormal bleeding.  Discussed financial obligations and pt states has not had any problem in  in obtaining medication.  Next lab test test in 6 months.  Pt states has not had any problems with bleeding or any sign or symptom of stroke States no new medications Will get CBC and BMET today and will call with results of these labs 05/09/2014 Spoke with pt and informed that labs are within normal limits and to continue same dose of Xarelto and made appt for him to be seen in 6 months and pt states understanding

## 2014-05-09 ENCOUNTER — Telehealth: Payer: Self-pay | Admitting: Internal Medicine

## 2014-05-09 ENCOUNTER — Other Ambulatory Visit: Payer: Self-pay | Admitting: Internal Medicine

## 2014-05-09 ENCOUNTER — Other Ambulatory Visit: Payer: Self-pay | Admitting: Pulmonary Disease

## 2014-05-09 NOTE — Telephone Encounter (Signed)
New Msg       Pt states he is returning call from today.    Please return call.

## 2014-05-09 NOTE — Telephone Encounter (Signed)
See coumadin encounter of yesterday note completed today with pt given results of labs and appt made to be seen in 6 months

## 2014-05-10 ENCOUNTER — Encounter: Payer: Self-pay | Admitting: Cardiology

## 2014-05-11 NOTE — Telephone Encounter (Signed)
Last filled 05/2013 #90 with 1 refill--please advise

## 2014-07-24 ENCOUNTER — Encounter: Payer: Self-pay | Admitting: Internal Medicine

## 2014-07-24 ENCOUNTER — Ambulatory Visit (INDEPENDENT_AMBULATORY_CARE_PROVIDER_SITE_OTHER): Payer: Medicare Other | Admitting: Internal Medicine

## 2014-07-24 VITALS — BP 140/72 | HR 57 | Ht 69.0 in | Wt 203.0 lb

## 2014-07-24 DIAGNOSIS — Q248 Other specified congenital malformations of heart: Secondary | ICD-10-CM

## 2014-07-24 DIAGNOSIS — E785 Hyperlipidemia, unspecified: Secondary | ICD-10-CM | POA: Diagnosis not present

## 2014-07-24 DIAGNOSIS — D631 Anemia in chronic kidney disease: Secondary | ICD-10-CM | POA: Diagnosis not present

## 2014-07-24 DIAGNOSIS — I480 Paroxysmal atrial fibrillation: Secondary | ICD-10-CM | POA: Diagnosis not present

## 2014-07-24 DIAGNOSIS — N182 Chronic kidney disease, stage 2 (mild): Secondary | ICD-10-CM | POA: Diagnosis not present

## 2014-07-24 DIAGNOSIS — I498 Other specified cardiac arrhythmias: Secondary | ICD-10-CM

## 2014-07-24 DIAGNOSIS — Z95810 Presence of automatic (implantable) cardiac defibrillator: Secondary | ICD-10-CM

## 2014-07-24 LAB — CUP PACEART INCLINIC DEVICE CHECK
Battery Voltage: 2.54 V
Brady Statistic RV Percent Paced: 0.01 %
Date Time Interrogation Session: 20160503130855
HighPow Impedance: 50.31 Ohm
Lead Channel Pacing Threshold Amplitude: 1 V
Lead Channel Pacing Threshold Pulse Width: 0.5 ms
Lead Channel Pacing Threshold Pulse Width: 0.5 ms
Lead Channel Sensing Intrinsic Amplitude: 9.5 mV
Lead Channel Setting Pacing Amplitude: 2.5 V
Lead Channel Setting Sensing Sensitivity: 0.3 mV
MDC IDC MSMT BATTERY REMAINING LONGEVITY: 18 mo
MDC IDC MSMT LEADCHNL RV IMPEDANCE VALUE: 475 Ohm
MDC IDC MSMT LEADCHNL RV PACING THRESHOLD AMPLITUDE: 1 V
MDC IDC PG SERIAL: 449377
MDC IDC SET LEADCHNL RV PACING PULSEWIDTH: 0.5 ms
MDC IDC SET ZONE DETECTION INTERVAL: 250 ms
MDC IDC SET ZONE DETECTION INTERVAL: 300 ms
Zone Setting Detection Interval: 350 ms

## 2014-07-24 NOTE — Assessment & Plan Note (Signed)
His St. Jude single-chamber ICD is working normally. We'll plan to recheck in several months. 

## 2014-07-24 NOTE — Assessment & Plan Note (Signed)
His atrial fibrillation appears to be well-controlled. He'll continue his current medications.

## 2014-07-24 NOTE — Assessment & Plan Note (Signed)
He is encouraged to reduce his fat intake, and he will continue simvastatin.

## 2014-07-24 NOTE — Patient Instructions (Addendum)
Medication Instructions:  Your physician recommends that you continue on your current medications as directed. Please refer to the Current Medication list given to you today.   Labwork: None ordered  Testing/Procedures: None ordered  Follow-Up: Remote monitoring is used to monitor your ICD from home. This monitoring reduces the number of office visits required to check your device to one time per year. It allows [place] to keep an eye on the functioning of your device to ensure it is working properly. You are scheduled for a device check from home on 10/23/2014. You may send your transmission at any time that day. If you have a wireless device, the transmission will be sent automatically. After your physician reviews your transmission, you will receive a postcard with your next transmission date.  Your physician recommends that you schedule a follow-up appointment in: 12 months with Dr.Taylor  Any Other Special Instructions Will Be Listed Below (If Applicable).

## 2014-07-24 NOTE — Progress Notes (Signed)
HPI [name] returns today for followup. He is a very pleasant 69 year old man with coronary artery disease, and ischemic cardiomyopathy, PAF, Brugada syndrome, status post ICD implantation. He returns today for followup. The patient denies chest pain. He has class II heart failure symptoms. The patient has done well in the interim. No peripheral edema.  He is taking Xarelto.  Since his last visit, he has had no ICD therapies. Allergies  Allergen Reactions  . Lipitor [Atorvastatin]     REACTION: pt states rash from Lipitor     Current Outpatient Prescriptions  Medication Sig Dispense Refill  . clotrimazole-betamethasone (LOTRISONE) cream Apply to groin rash 2 times daily 30 g 1  . glipiZIDE (GLUCOTROL) 5 MG tablet Take 1 tablet (5 mg total) by mouth 2 (two) times daily before a meal. 60 tablet 2  . levETIRAcetam (KEPPRA) 500 MG tablet Take one tablet by mouth in the morning and two tablets by mouth at night    . losartan (COZAAR) 25 MG tablet Take 1 tablet (25 mg total) by mouth daily. 90 tablet 3  . meloxicam (MOBIC) 7.5 MG tablet TAKE 1 TABLET (7.5 MG TOTAL) BY MOUTH DAILY. 90 tablet 1  . metoprolol tartrate (LOPRESSOR) 25 MG tablet Take 12.5 mg by mouth 2 (two) times daily.    . Multiple Vitamin (MULTIVITAMIN) tablet Take 1 tablet by mouth daily.      . naftifine (NAFTIN) 1 % cream Apply 1 application topically 2 (two) times daily as needed (For  infection between toes).     . Niacin, Antihyperlipidemic, 500 MG TABS Take 500 mg by mouth daily. 30 tablet 2  . Rivaroxaban (XARELTO) 20 MG TABS tablet Take 20 mg by mouth daily with supper.    . simvastatin (ZOCOR) 20 MG tablet Take one half tablet by mouth daily at bedtime      No current facility-administered medications for this visit.     Past Medical History  Diagnosis Date  . COPD (chronic obstructive pulmonary disease)   . CAD (coronary artery disease)   . 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 Automatic  implantable cardiac defibrillator in situ   . Hyperlipidemia   . Borderline diabetes mellitus   . GERD (gastroesophageal reflux disease)   . Colonic polyp 2004    hyperplastic   . History of renal insufficiency syndrome   . Stroke   . Seizure disorder   . Monoclonal gammopathy   . Arthritis   . Atrial fibrillation   . HTN (hypertension)     ROS:   All systems reviewed and negative except as noted in the HPI.   Past Surgical History  Procedure Laterality Date  . Cardiac catheterization  1987    Showed distal left circumflex 100% occluded   . Cardiac defibrillator placement  08/17/2006    Implantation of a St. Jude single chamber defibrillator  . Inguinal hernia repair Left      Family History  Problem Relation Age of Onset  . Heart attack Brother   . Colon cancer Brother   . Heart attack Mother      History   Social History  . Marital Status: Single    Spouse Name: N/A  . Number of Children: 1  . Years of Education: N/A   Occupational History  . retired    Social History Main Topics  . Smoking status: Former Smoker -- 0.50 packs/day for 15 years    Types: Cigarettes    Quit date: 03/23/2004  .  Smokeless tobacco: Never Used  . Alcohol Use: 0.6 oz/week    1 Glasses of wine per week     Comment: rare--wine  . Drug Use: No  . Sexual Activity: Not on file   Other Topics Concern  . Not on file   Social History Narrative   ICD-St. Jude  Remote-Yes     BP 140/72 mmHg  Pulse 57  Ht 5\' 9"  (1.753 m)  Wt 203 lb (92.08 kg)  BMI 29.96 kg/m2  Physical Exam:  Well appearing 69 year old man,NAD HEENT: Unremarkable Neck:  No JVD, no thyromegally Lungs:  Clear with no wheezes, rales, or rhonchi. HEART:  Regular rate rhythm, no murmurs, no rubs, no clicks Abd:  soft, positive bowel sounds, no organomegally, no rebound, no guarding Ext:  2 plus pulses, no edema, no cyanosis, no clubbing Skin:  No rashes no nodules Neuro:  CN II through XII intact, motor  grossly intact   DEVICE  Normal device function.  See PaceArt for details.   Assess/Plan:

## 2014-08-30 ENCOUNTER — Encounter: Payer: Self-pay | Admitting: Internal Medicine

## 2014-08-30 ENCOUNTER — Ambulatory Visit (INDEPENDENT_AMBULATORY_CARE_PROVIDER_SITE_OTHER): Payer: Medicare Other | Admitting: Internal Medicine

## 2014-08-30 ENCOUNTER — Other Ambulatory Visit: Payer: Self-pay

## 2014-08-30 VITALS — BP 154/86 | HR 90 | Temp 98.1°F | Ht 68.5 in | Wt 206.0 lb

## 2014-08-30 DIAGNOSIS — Z125 Encounter for screening for malignant neoplasm of prostate: Secondary | ICD-10-CM

## 2014-08-30 DIAGNOSIS — Z23 Encounter for immunization: Secondary | ICD-10-CM | POA: Diagnosis not present

## 2014-08-30 DIAGNOSIS — M1990 Unspecified osteoarthritis, unspecified site: Secondary | ICD-10-CM

## 2014-08-30 DIAGNOSIS — I480 Paroxysmal atrial fibrillation: Secondary | ICD-10-CM

## 2014-08-30 DIAGNOSIS — E785 Hyperlipidemia, unspecified: Secondary | ICD-10-CM

## 2014-08-30 DIAGNOSIS — I10 Essential (primary) hypertension: Secondary | ICD-10-CM

## 2014-08-30 DIAGNOSIS — H00016 Hordeolum externum left eye, unspecified eyelid: Secondary | ICD-10-CM

## 2014-08-30 DIAGNOSIS — K219 Gastro-esophageal reflux disease without esophagitis: Secondary | ICD-10-CM

## 2014-08-30 DIAGNOSIS — Z0000 Encounter for general adult medical examination without abnormal findings: Secondary | ICD-10-CM

## 2014-08-30 DIAGNOSIS — E119 Type 2 diabetes mellitus without complications: Secondary | ICD-10-CM

## 2014-08-30 DIAGNOSIS — I25119 Atherosclerotic heart disease of native coronary artery with unspecified angina pectoris: Secondary | ICD-10-CM | POA: Diagnosis not present

## 2014-08-30 DIAGNOSIS — R569 Unspecified convulsions: Secondary | ICD-10-CM

## 2014-08-30 DIAGNOSIS — J449 Chronic obstructive pulmonary disease, unspecified: Secondary | ICD-10-CM

## 2014-08-30 LAB — COMPREHENSIVE METABOLIC PANEL
ALT: 23 U/L (ref 0–53)
AST: 20 U/L (ref 0–37)
Albumin: 4.4 g/dL (ref 3.5–5.2)
Alkaline Phosphatase: 57 U/L (ref 39–117)
BUN: 16 mg/dL (ref 6–23)
CALCIUM: 9.3 mg/dL (ref 8.4–10.5)
CO2: 29 meq/L (ref 19–32)
CREATININE: 1.5 mg/dL (ref 0.40–1.50)
Chloride: 103 mEq/L (ref 96–112)
GFR: 60.1 mL/min (ref >60.00)
GLUCOSE: 73 mg/dL (ref 70–99)
POTASSIUM: 3.9 meq/L (ref 3.5–5.1)
Sodium: 136 mEq/L (ref 135–145)
Total Bilirubin: 1 mg/dL (ref 0.2–1.2)
Total Protein: 7.2 g/dL (ref 6.0–8.3)

## 2014-08-30 LAB — LIPID PANEL
CHOL/HDL RATIO: 4
Cholesterol: 133 mg/dL (ref 0–200)
HDL: 35.6 mg/dL — AB (ref >39.00)
NonHDL: 97.4
Triglycerides: 367 mg/dL — ABNORMAL HIGH (ref 0.0–149.0)
VLDL: 73.4 mg/dL — ABNORMAL HIGH (ref 0.0–40.0)

## 2014-08-30 LAB — HEMOGLOBIN A1C: HEMOGLOBIN A1C: 6.6 % — AB (ref 4.6–6.5)

## 2014-08-30 LAB — CBC
HCT: 40.8 % (ref 39.0–52.0)
HEMOGLOBIN: 13.6 g/dL (ref 13.0–17.0)
MCHC: 33.4 g/dL (ref 30.0–36.0)
MCV: 90.9 fl (ref 78.0–100.0)
Platelets: 176 10*3/uL (ref 150.0–400.0)
RBC: 4.49 Mil/uL (ref 4.22–5.81)
RDW: 13 % (ref 11.5–15.5)
WBC: 6.6 10*3/uL (ref 4.0–10.5)

## 2014-08-30 LAB — LDL CHOLESTEROL, DIRECT: Direct LDL: 27 mg/dL

## 2014-08-30 LAB — PSA, MEDICARE: PSA: 0.61 ng/mL (ref 0.10–4.00)

## 2014-08-30 MED ORDER — GLIPIZIDE 5 MG PO TABS: 5.0000 mg | ORAL_TABLET | Freq: Two times a day (BID) | ORAL | Status: DC

## 2014-08-30 NOTE — Assessment & Plan Note (Signed)
Will repeat A1C today No microalbumin as he is on ARB Prevnar today Flu and Pneumovax UTD He gets eye exam yearly Continue Glipizide unless instructed otherwise

## 2014-08-30 NOTE — Patient Instructions (Signed)
Mild Neurocognitive Disorder Mild neurocognitive disorder (formerly known as mild cognitive impairment) is a mental disorder. It is a slight abnormal decrease in mental function. The areas of mental function affected may include memory, thought, communication, behavior, and completion of tasks. The decrease is noticeable and measurable but for the most part does not interfere with your daily activities. Mild neurocognitive disorder typically occurs in people older than 60 years but can occur earlier. It is not as serious as major neurocognitive disorder (formerly known as dementia) but may lead to a more serious neurocognitive disorder. However, in some cases the condition does not get worse. A few people with this disorder even improve. CAUSES  There are a number of different causes of mild neurocognitive disorder:   Brain disorders associated with abnormal protein deposits, such as Alzheimer's disease, Pick's disease, and Lewy body disease.  Brain disorders associated with abnormal movement, such as Parkinson's disease and Huntington's disease.  Diseases affecting blood vessels in the brain and resulting in mini-strokes.  Certain infections, such as human immunodeficiency virus (HIV) infection.  Traumatic brain injury.  Other medical conditions such as brain tumors, underactive thyroid (hypothyroidism), and vitamin B12 deficiency.  Use of certain prescription medicine and "recreational" drugs. SYMPTOMS  Symptoms of mild neurocognitive disorder include:  Difficulty remembering. You may forget details of recent events, names, or phone numbers. You may forget important social events and appointments or repeatedly forget where you put your car keys.  Difficulty thinking and solving problems. You may have trouble with complex tasks such as paying bills or driving in unfamiliar locations.  Difficulty communicating. You may have trouble finding the right word, naming an object, forming a  sentence that makes sense, or understanding what you read or hear.  Changes in your behavior or personality. You may lose interest in the things that you used to enjoy or withdraw from social situations. You may get angry more easily than usual. You may act before thinking. You may do things in public that you would not usually do. You may hear or see things that are not real (hallucinations). You may believe falsely that others are trying to hurt you (paranoia). DIAGNOSIS Mild neurocognitive disorder is diagnosed through an assessment by your health care provider. Your health care provider will ask you and your family, friends, or coworkers questions about your symptoms. He or she will ask how often the symptoms occur, how long they have been occurring, whether they are getting worse, and the effect they are having on your life. Your health care provider may refer you to a neurologist or mental health specialist for a detailed evaluation of your mental functions (neuropsychological testing).  To identify the cause of your mild neurocognitive disorder, your health care provider may:  Obtain a detailed medical history.  Ask about alcohol and drug use, including prescription medicine.  Perform a physical exam.  Order blood tests and brain imaging exams. TREATMENT  Mild neurocognitive disorder caused by infections, use of certain medicines or "recreational" drugs, and certain medical conditions may improve with treatment of the condition that is causing the disorder. Mild neurocognitive disorder resulting from other causes generally does not improve and may worsen. In these cases, the goal of treatment is to slow progression of the disorder and help you cope with the loss of mental function. Treatments in these cases include:   Medicine. Medicine helps mainly with memory loss and behavioral symptoms.   Talk therapy. Talk therapy provides education, emotional support, memory aids, and other   ways of  making up for decreases in mental function.   Lifestyle changes. These include regular exercise, a healthy diet (including essential omega-3 fatty acids), intellectual stimulation, and increased social interaction. Document Released: 11/09/2012 Document Revised: 07/24/2013 Document Reviewed: 11/09/2012 [facility] Patient Information 2015 [place], [place]. This information is not intended to replace advice given to you by your health care provider. Make sure you discuss any questions you have with your health care provider.

## 2014-08-30 NOTE — Assessment & Plan Note (Signed)
Not medicated Discussed how weight loss could improve his symptoms

## 2014-08-30 NOTE — Assessment & Plan Note (Signed)
Controlled with Metoprolol and Xarelto Continue to follow with cardiology

## 2014-08-30 NOTE — Addendum Note (Signed)
Addended by: Lurlean Nanny on: 08/30/2014 04:13 PM   Modules accepted: Orders

## 2014-08-30 NOTE — Assessment & Plan Note (Signed)
No angina LDL at goal on Simvistatin Advised him to consume a low fat diet Take fish oil BID to help lower triglycerides On Xarelto- continue

## 2014-08-30 NOTE — Assessment & Plan Note (Signed)
None on Keppra Will continue for now

## 2014-08-30 NOTE — Assessment & Plan Note (Signed)
Continue meloxicam daily

## 2014-08-30 NOTE — Assessment & Plan Note (Signed)
BP elevated today but he does not think he has taken his BP medication Will continue Losartan at this time CBC and CMET today DASH diet Work on exercising to lose some weight

## 2014-08-30 NOTE — Assessment & Plan Note (Signed)
Lungs normal today Will continue to monitor He will continue to follow with pulmonology

## 2014-08-30 NOTE — Assessment & Plan Note (Signed)
No issues on simvistatin Consume a low fat diet

## 2014-08-30 NOTE — Progress Notes (Signed)
HPI:  Pt presents to the clinic today for his annual medicare wellness visit. He is also due for 6 month follow up of chronic conditions.  COPD: Currently not on any inhalers. He no longer smokes. He does follow with pulmonology. Last note reviewed. Flu and Pneumovax UTD.  HLD with CAD: His last LDL was 44.8. His triglycerides are 310.0.  He takes Simvistatin daily. He takes Fish Oil when he remembers. He tries to consume a low fat diet.  DM 2: He does not test his sugars. His last A1C 912/2015) was 7.7%. He denies numbness or tingling in his hands or feet. He takes Glipizide daily as instructed. His last eye exam was 01/2014. Flu and Pneumovax are UTD.  GERD: Rare. He is not sure what triggers his reflux. He does not take anything OTC for this.  CRI: Last creatinine 1.24 (05/2014).  Seizure disorder: No seizures recently. He takes Keppra twice a day.  HTN: His BP today is 154/86. He is not sure if he has taken his blood pressure medicaiton today or not. He is on Losartan 25 mg.  Afib: He is on Metoprolol and  Xarelto. He does follow with cardiology. He denies chest pain or shortness of breath  He is concerned about a lump on his left eye. He noticed this 3-4 days ago. He is putting saline eye drops without relief. He denies blurred vision. He has not had any trauma to the eye.  Past Medical History  Diagnosis Date  . COPD (chronic obstructive pulmonary disease)   . CAD (coronary artery disease)   . 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 Automatic implantable cardiac defibrillator in situ   . Hyperlipidemia   . Borderline diabetes mellitus   . GERD (gastroesophageal reflux disease)   . Colonic polyp 2004    hyperplastic   . History of renal insufficiency syndrome   . Stroke   . Seizure disorder   . Monoclonal gammopathy   . Arthritis   . Atrial fibrillation   . HTN (hypertension)     Current Outpatient Prescriptions  Medication Sig Dispense Refill  .  clotrimazole-betamethasone (LOTRISONE) cream Apply to groin rash 2 times daily 30 g 1  . glipiZIDE (GLUCOTROL) 5 MG tablet Take 1 tablet (5 mg total) by mouth 2 (two) times daily before a meal. 180 tablet 0  . levETIRAcetam (KEPPRA) 500 MG tablet Take one tablet by mouth in the morning and two tablets by mouth at night    . losartan (COZAAR) 25 MG tablet Take 1 tablet (25 mg total) by mouth daily. 90 tablet 3  . meloxicam (MOBIC) 7.5 MG tablet TAKE 1 TABLET (7.5 MG TOTAL) BY MOUTH DAILY. 90 tablet 1  . metoprolol tartrate (LOPRESSOR) 25 MG tablet Take 12.5 mg by mouth 2 (two) times daily.    . Multiple Vitamin (MULTIVITAMIN) tablet Take 1 tablet by mouth daily.      . naftifine (NAFTIN) 1 % cream Apply 1 application topically 2 (two) times daily as needed (For  infection between toes).     . Niacin, Antihyperlipidemic, 500 MG TABS Take 500 mg by mouth daily. 30 tablet 2  . Rivaroxaban (XARELTO) 20 MG TABS tablet Take 20 mg by mouth daily with supper.    . simvastatin (ZOCOR) 20 MG tablet Take one half tablet by mouth daily at bedtime      No current facility-administered medications for this visit.    Allergies  Allergen Reactions  . Lipitor [Atorvastatin]  REACTION: pt states rash from Lipitor    Family History  Problem Relation Age of Onset  . Heart attack Brother   . Colon cancer Brother   . Heart attack Mother     History   Social History  . Marital Status: Single    Spouse Name: N/A  . Number of Children: 1  . Years of Education: N/A   Occupational History  . retired    Social History Main Topics  . Smoking status: Former Smoker -- 0.50 packs/day for 15 years    Types: Cigarettes    Quit date: 03/23/2004  . Smokeless tobacco: Never Used  . Alcohol Use: 0.6 oz/week    1 Glasses of wine per week     Comment: rare--wine  . Drug Use: No  . Sexual Activity: Not on file   Other Topics Concern  . Not on file   Social History Narrative   ICD-St. Jude  Remote-Yes     Hospitiliaztions: None  Health Maintenance:    Flu: 2014  Tetanus: 2015  Pneumovax: 08/2013  Prevnar: never  Zostavax:never  PSA Screening: 05/2013  Bone Density: 10/2006  Colonoscopy: 2015 at [facility] Doctor: unsure  Dental Exam: biannually  Providers:    PCP: Webb Silversmith, NP-C  Cardioogist: Dr. Lovena Le  Oncologist: Dr. Alen Blew  Pulmonologist: Dr. Lenna Gilford  Gastroenterologist: Dr. Fuller Plan  I have personally reviewed and have noted:  1. The patient's medical and social history 2. Their use of alcohol, tobacco or illicit drugs 3. Their current medications and supplements 4. The patient's functional ability including ADL's, fall risks, home safety risks and  hearing or visual impairment. 5. Diet and physical activities 6. Evidence for depression or mood disorder  Subjective:   Review of Systems:   Constitutional: Denies fever, malaise, fatigue, headache or abrupt weight changes.  HEENT: Denies eye pain, eye redness, ear pain, ringing in the ears, wax buildup, runny nose, nasal congestion, bloody nose, or sore throat. Respiratory: Denies difficulty breathing, shortness of breath, cough or sputum production.   Cardiovascular: Denies chest pain, chest tightness, palpitations or swelling in the hands or feet.  Gastrointestinal: Denies abdominal pain, bloating, constipation, diarrhea or blood in the stool.  GU: Denies urgency, frequency, pain with urination, burning sensation, blood in urine, odor or discharge. Musculoskeletal: Denies decrease in range of motion, difficulty with gait, muscle pain or joint pain and swelling.  Skin: Pt reports lesion on left lower eyelid. Denies redness, rashes, or ulcercations.  Neurological: Denies dizziness, difficulty with memory, difficulty with speech or problems with balance and coordination.  Psych: Pt denies anxiety, depression, SI/HI.  No other specific complaints in a complete review of systems (except as listed in HPI  above).  Objective:  PE:   BP 154/86 mmHg  Pulse 90  Temp(Src) 98.1 F (36.7 C) (Oral)  Ht 5' 8.5" (1.74 m)  Wt 206 lb (93.441 kg)  BMI 30.86 kg/m2  SpO2 98%  Wt Readings from Last 3 Encounters:  07/24/14 203 lb (92.08 kg)  02/27/14 205 lb (92.987 kg)  02/09/14 208 lb 8 oz (94.575 kg)    General: Appears his stated age, obese in NAD. Skin: Warm, dry and intact. No rashes, lesions or ulcerations noted. HEENT: Head: normal shape and size; Eyes: sclera white, no icterus, conjunctiva pink, PERRLA and EOMs intact, small stye noted to left lower lid; Ears: Tm's gray and intact, normal light reflex; Throat/Mouth: Teeth present, mucosa pink and moist, no exudate, lesions or ulcerations noted.  Neck:  Neck supple, trachea midline. No masses, lumps or thyromegaly present.  Cardiovascular: Normal rate and rhythm. S1,S2 noted.  No murmur, rubs or gallops noted. No JVD or BLE edema. No carotid bruits noted. Pulmonary/Chest: Normal effort and positive vesicular breath sounds. No respiratory distress. No wheezes, rales or ronchi noted.  Abdomen: Soft and nontender. Normal bowel sounds, no bruits noted. No distention or masses noted. Liver, spleen and kidneys non palpable. Musculoskeletal: Strength 5/5 BU/BLE. No signs of joint swelling. No difficulty with gait.  Neurological: Awake, confused at times. He has obvious trouble with recall. Psychiatric: Mood and affect normal. Behavior is normal. Judgment and thought content normal.    BMET    Component Value Date/Time   NA 138 05/08/2014 1320   NA 137 02/02/2014 1009   K 4.2 05/08/2014 1320   K 4.7 02/02/2014 1009   CL 105 05/08/2014 1320   CL 104 01/06/2012 1355   CO2 29 05/08/2014 1320   CO2 25 02/02/2014 1009   GLUCOSE 67* 05/08/2014 1320   GLUCOSE 127 02/02/2014 1009   GLUCOSE 77 01/06/2012 1355   BUN 16 05/08/2014 1320   BUN 13.1 02/02/2014 1009   CREATININE 1.24 05/08/2014 1320   CREATININE 1.4* 02/02/2014 1009   CREATININE  1.40* 11/04/2012 1622   CALCIUM 9.2 05/08/2014 1320   CALCIUM 10.0 02/02/2014 1009   GFRNONAA 74.56 02/03/2010 1110   GFRAA 57 02/08/2008 1040    Lipid Panel     Component Value Date/Time   CHOL 150 02/27/2014 1036   TRIG 310.0* 02/27/2014 1036   HDL 35.90* 02/27/2014 1036   CHOLHDL 4 02/27/2014 1036   VLDL 62.0* 02/27/2014 1036   LDLCALC 57 06/14/2013 1235    CBC    Component Value Date/Time   WBC 5.3 05/08/2014 1320   WBC 4.6 02/02/2014 1009   RBC 4.64 05/08/2014 1320   RBC 4.93 02/02/2014 1009   HGB 14.1 05/08/2014 1320   HGB 14.5 02/02/2014 1009   HCT 41.2 05/08/2014 1320   HCT 44.7 02/02/2014 1009   PLT 179.0 05/08/2014 1320   PLT 189 02/02/2014 1009   MCV 88.8 05/08/2014 1320   MCV 90.6 02/02/2014 1009   MCH 29.5 02/02/2014 1009   MCH 31.2 11/04/2012 1622   MCHC 34.1 05/08/2014 1320   MCHC 32.6 02/02/2014 1009   RDW 13.2 05/08/2014 1320   RDW 13.2 02/02/2014 1009   LYMPHSABS 1.4 02/02/2014 1009   LYMPHSABS 1.2 11/04/2012 1622   MONOABS 0.3 02/02/2014 1009   MONOABS 0.4 11/04/2012 1622   EOSABS 0.1 02/02/2014 1009   EOSABS 0.1 11/04/2012 1622   BASOSABS 0.1 02/02/2014 1009   BASOSABS 0.0 11/04/2012 1622    Hgb A1C Lab Results  Component Value Date   HGBA1C 7.7* 02/27/2014      Assessment and Plan:   Medicare Annual Wellness Visit:  Diet: Heart healthy/Diabetic Physical activity: He works out 6 days per week at the gym Depression/mood screen: Negative Hearing: Intact to whispered voice Visual acuity: Grossly normal, performs annual eye exam  ADLs: Capable Fall risk: None Home safety: Good Cognitive evaluation: Oriented to person and place, not time. Unable to recall 1/3 objects. Unable to spell "world" backwards. Clock drawing was slightly off. EOL planning: He is working on [employer], full code/ I agree  Preventative Medicine: Prevnar given today, CBC, CMET, Lipid, A1C and PSA  Next appointment: 6 months to follow up chronic  conditons   Stye, left eye:  Warm compresses TID

## 2014-08-30 NOTE — Progress Notes (Signed)
Pre visit review using our clinic review tool, if applicable. No additional management support is needed unless otherwise documented below in the visit note. 

## 2014-09-10 ENCOUNTER — Other Ambulatory Visit: Payer: Self-pay | Admitting: Internal Medicine

## 2014-09-19 ENCOUNTER — Ambulatory Visit: Payer: Medicare Other | Admitting: Internal Medicine

## 2014-10-01 DIAGNOSIS — R3915 Urgency of urination: Secondary | ICD-10-CM | POA: Diagnosis not present

## 2014-10-01 DIAGNOSIS — N5201 Erectile dysfunction due to arterial insufficiency: Secondary | ICD-10-CM | POA: Diagnosis not present

## 2014-10-01 DIAGNOSIS — R350 Frequency of micturition: Secondary | ICD-10-CM | POA: Diagnosis not present

## 2014-10-01 DIAGNOSIS — N320 Bladder-neck obstruction: Secondary | ICD-10-CM | POA: Diagnosis not present

## 2014-10-22 ENCOUNTER — Other Ambulatory Visit: Payer: Self-pay | Admitting: Internal Medicine

## 2014-10-23 ENCOUNTER — Other Ambulatory Visit: Payer: Self-pay | Admitting: Internal Medicine

## 2014-10-23 ENCOUNTER — Telehealth: Payer: Self-pay | Admitting: Internal Medicine

## 2014-10-23 ENCOUNTER — Ambulatory Visit (INDEPENDENT_AMBULATORY_CARE_PROVIDER_SITE_OTHER): Payer: Medicare Other | Admitting: *Deleted

## 2014-10-23 DIAGNOSIS — Q248 Other specified congenital malformations of heart: Secondary | ICD-10-CM | POA: Diagnosis not present

## 2014-10-23 DIAGNOSIS — I498 Other specified cardiac arrhythmias: Secondary | ICD-10-CM

## 2014-10-23 LAB — CUP PACEART REMOTE DEVICE CHECK
Battery Voltage: 2.54 V
Brady Statistic RV Percent Paced: 1 %
HighPow Impedance: 51 Ohm
Lead Channel Impedance Value: 440 Ohm
Lead Channel Pacing Threshold Amplitude: 1 V
Lead Channel Setting Sensing Sensitivity: 0.3 mV
MDC IDC MSMT BATTERY REMAINING LONGEVITY: 18 mo
MDC IDC MSMT LEADCHNL RV PACING THRESHOLD PULSEWIDTH: 0.5 ms
MDC IDC MSMT LEADCHNL RV SENSING INTR AMPL: 9.7 mV
MDC IDC SESS DTM: 20160802124724
MDC IDC SET LEADCHNL RV PACING AMPLITUDE: 2.5 V
MDC IDC SET LEADCHNL RV PACING PULSEWIDTH: 0.5 ms
Pulse Gen Serial Number: 449377
Zone Setting Detection Interval: 250 ms
Zone Setting Detection Interval: 300 ms
Zone Setting Detection Interval: 350 ms

## 2014-10-23 NOTE — Telephone Encounter (Signed)
Informed pt that his remote transmission was received. Pt verbalized understanding.  

## 2014-10-23 NOTE — Telephone Encounter (Signed)
New message       Did we get his remote transmission?

## 2014-10-24 NOTE — Progress Notes (Signed)
Remote ICD transmission.   

## 2014-11-06 ENCOUNTER — Ambulatory Visit (INDEPENDENT_AMBULATORY_CARE_PROVIDER_SITE_OTHER): Payer: Medicare Other | Admitting: Pharmacist

## 2014-11-06 VITALS — Wt 205.0 lb

## 2014-11-06 DIAGNOSIS — I4891 Unspecified atrial fibrillation: Secondary | ICD-10-CM

## 2014-11-06 LAB — BASIC METABOLIC PANEL
BUN: 13 mg/dL (ref 6–23)
CO2: 27 mEq/L (ref 19–32)
Calcium: 9.6 mg/dL (ref 8.4–10.5)
Chloride: 103 mEq/L (ref 96–112)
Creatinine, Ser: 1.36 mg/dL (ref 0.40–1.50)
GFR: 67.26 mL/min (ref >60.00)
GLUCOSE: 142 mg/dL — AB (ref 70–99)
POTASSIUM: 4.2 meq/L (ref 3.5–5.1)
Sodium: 135 mEq/L (ref 135–145)

## 2014-11-06 LAB — CBC
HEMATOCRIT: 43.3 % (ref 39.0–52.0)
HEMOGLOBIN: 14.6 g/dL (ref 13.0–17.0)
MCHC: 33.6 g/dL (ref 30.0–36.0)
MCV: 91.4 fl (ref 78.0–100.0)
Platelets: 171 10*3/uL (ref 150.0–400.0)
RBC: 4.74 Mil/uL (ref 4.22–5.81)
RDW: 12.9 % (ref 11.5–15.5)
WBC: 5.3 10*3/uL (ref 4.0–10.5)

## 2014-11-06 NOTE — Progress Notes (Signed)
Pt was started on Xarelto 20mg  daily for afib in July 2014.  Reviewed patients medication list.  Pt is not currently on any combined P-gp and strong CYP3A4 inhibitors/inducers (ketoconazole, traconazole, ritonavir, carbamazepine, phenytoin, rifampin, St. John's wort).  Reviewed labs.  SCr 1.36, Weight 93.2 kg, CrCl 35mL/min.  Dose appropriate based on CrCl.   Hgb and HCT 14.6/43.3 - both stable and WNL. Left message with patient informing him of lab results and to continue on Xarelto 20mg  daily.  A full discussion of the nature of anticoagulants has been carried out.  A benefit/risk analysis has been presented to the patient, so that they understand the justification for choosing anticoagulation with Xarelto at this time.  The need for compliance is stressed.  Pt is aware to take the medication once daily with the largest meal of the day.  Side effects of potential bleeding are discussed, including unusual colored urine or stools, coughing up blood or coffee ground emesis, nose bleeds or serious fall or head trauma.  Discussed signs and symptoms of stroke. The patient should avoid any OTC items containing aspirin or ibuprofen.  Avoid alcohol consumption.   Call if any signs of abnormal bleeding.  Discussed financial obligations and resolved any difficulty in obtaining medication.

## 2014-11-14 ENCOUNTER — Ambulatory Visit (INDEPENDENT_AMBULATORY_CARE_PROVIDER_SITE_OTHER): Payer: Medicare Other | Admitting: Internal Medicine

## 2014-11-14 ENCOUNTER — Encounter: Payer: Self-pay | Admitting: Internal Medicine

## 2014-11-14 VITALS — BP 128/82 | HR 65 | Temp 98.0°F | Wt 204.2 lb

## 2014-11-14 DIAGNOSIS — J449 Chronic obstructive pulmonary disease, unspecified: Secondary | ICD-10-CM

## 2014-11-14 DIAGNOSIS — K121 Other forms of stomatitis: Secondary | ICD-10-CM

## 2014-11-14 DIAGNOSIS — R05 Cough: Secondary | ICD-10-CM | POA: Diagnosis not present

## 2014-11-14 DIAGNOSIS — R059 Cough, unspecified: Secondary | ICD-10-CM

## 2014-11-14 NOTE — Progress Notes (Signed)
Pre visit review using our clinic review tool, if applicable. No additional management support is needed unless otherwise documented below in the visit note. 

## 2014-11-14 NOTE — Patient Instructions (Signed)
Cough, Adult  A cough is a reflex that helps clear your throat and airways. It can help heal the body or may be a reaction to an irritated airway. A cough may only last 2 or 3 weeks (acute) or may last more than 8 weeks (chronic).  CAUSES Acute cough:  Viral or bacterial infections. Chronic cough:  Infections.  Allergies.  Asthma.  Post-nasal drip.  Smoking.  Heartburn or acid reflux.  Some medicines.  Chronic lung problems (COPD).  Cancer. SYMPTOMS   Cough.  Fever.  Chest pain.  Increased breathing rate.  High-pitched whistling sound when breathing (wheezing).  Colored mucus that you cough up (sputum). TREATMENT   A bacterial cough may be treated with antibiotic medicine.  A viral cough must run its course and will not respond to antibiotics.  Your caregiver may recommend other treatments if you have a chronic cough. HOME CARE INSTRUCTIONS   Only take over-the-counter or prescription medicines for pain, discomfort, or fever as directed by your caregiver. Use cough suppressants only as directed by your caregiver.  Use a cold steam vaporizer or humidifier in your bedroom or home to help loosen secretions.  Sleep in a semi-upright position if your cough is worse at night.  Rest as needed.  Stop smoking if you smoke. SEEK IMMEDIATE MEDICAL CARE IF:   You have pus in your sputum.  Your cough starts to worsen.  You cannot control your cough with suppressants and are losing sleep.  You begin coughing up blood.  You have difficulty breathing.  You develop pain which is getting worse or is uncontrolled with medicine.  You have a fever. MAKE SURE YOU:   Understand these instructions.  Will watch your condition.  Will get help right away if you are not doing well or get worse. Document Released: 09/05/2010 Document Revised: 06/01/2011 Document Reviewed: 09/05/2010 Exit[employer] Patient Information 2015 [employer]. This information is not intended  to replace advice given to you by your health care provider. Make sure you discuss any questions you have with your health care provider.  

## 2014-11-14 NOTE — Progress Notes (Signed)
HPI  Pt presents to the clinic today with c/o cough and chest congestion. He reports this started 1 week ago. The cough is productive of yellow mucous. He is mildly short of breath. He denies runny nose, ear pain or sore throat He has noticed some ulcers in his mouth. He denies fever, chills or body aches. He has tried Tylenol without much relief. He does have a history of COPD but reports he no longer smokes. He has not had sick contacts.  Review of Systems      Past Medical History  Diagnosis Date  . COPD (chronic obstructive pulmonary disease)   . CAD (coronary artery disease)   . 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 Automatic implantable cardiac defibrillator in situ   . Hyperlipidemia   . Borderline diabetes mellitus   . GERD (gastroesophageal reflux disease)   . Colonic polyp 2004    hyperplastic   . History of renal insufficiency syndrome   . Stroke   . Seizure disorder   . Monoclonal gammopathy   . Arthritis   . Atrial fibrillation   . HTN (hypertension)     Family History  Problem Relation Age of Onset  . Heart attack Brother   . Colon cancer Brother   . Heart attack Mother     Social History   Social History  . Marital Status: Single    Spouse Name: N/A  . Number of Children: 1  . Years of Education: N/A   Occupational History  . retired    Social History Main Topics  . Smoking status: Former Smoker -- 0.50 packs/day for 15 years    Types: Cigarettes    Quit date: 03/23/2004  . Smokeless tobacco: Never Used  . Alcohol Use: 0.6 oz/week    1 Glasses of wine per week     Comment: rare--wine  . Drug Use: No  . Sexual Activity: Not on file   Other Topics Concern  . Not on file   Social History Narrative   ICD-St. Jude  Remote-Yes    Allergies  Allergen Reactions  . Lipitor [Atorvastatin]     REACTION: pt states rash from Lipitor     Constitutional: Denies headache, fatigue, fever or abrupt weight changes.  HEENT:  Pt  reports oral ulcers. Denies eye redness, eye pain, pressure behind the eyes, facial pain, nasal congestion, ear pain, ringing in the ears, wax buildup, runny nose or sore throat. Respiratory: Positive cough and shortness of breath. Denies difficulty breathing.  Cardiovascular: Denies chest pain, chest tightness, palpitations or swelling in the hands or feet.   No other specific complaints in a complete review of systems (except as listed in HPI above).  Objective:   BP 128/82 mmHg  Pulse 65  Temp(Src) 98 F (36.7 C) (Oral)  Wt 204 lb 4 oz (92.647 kg)  SpO2 97% Wt Readings from Last 3 Encounters:  11/14/14 204 lb 4 oz (92.647 kg)  11/06/14 205 lb (92.987 kg)  08/30/14 206 lb (93.441 kg)     General: Appears his stated age, well developed, well nourished in NAD. HEENT: Head: normal shape and size; Eyes: sclera white, no icterus, conjunctiva pink; Ears: Tm's gray and intact, normal light reflex; Nose: mucosa pink and moist, septum midline; Throat/Mouth: Teeth present, mucosa pink and moist, no exudate noted. Small scattered aphthous ulcers noted on left cheek.  Neck: No cervical lymphadenopathy.  Cardiovascular: Normal rate and rhythm. S1,S2 noted.  No murmur, rubs or gallops noted.  Pulmonary/Chest: Normal effort and positive vesicular breath sounds. No respiratory distress. No wheezes, rales or ronchi noted.      Assessment & Plan:   Cough:  Likely viral Get some rest and drink plenty of water Try Mucinex every 12 hours for the next 3 days Delsym as needed for cough  Oral ulcers:  Salt water gargles as needed Reassurance given that this should resolve with time RTC as needed or if symptoms persist.

## 2014-11-16 ENCOUNTER — Telehealth: Payer: Self-pay

## 2014-11-16 NOTE — Telephone Encounter (Signed)
Pt left v/m; pt seen 11/14/14; pt is slightly better but pt has been taking mucinex and delsym but continues with very yellow phlegm when coughs. Pt wants to know if needs different med. [facility].

## 2014-11-16 NOTE — Telephone Encounter (Signed)
Pt is aware as instructed and will f/u if needed monday

## 2014-11-16 NOTE — Telephone Encounter (Signed)
Continue Mucinex over the weekend, let me know if you are not better by Monday

## 2014-11-19 ENCOUNTER — Other Ambulatory Visit: Payer: Self-pay | Admitting: Internal Medicine

## 2014-11-19 MED ORDER — AZITHROMYCIN 250 MG PO TABS: ORAL_TABLET | ORAL | Status: DC

## 2014-11-19 NOTE — Telephone Encounter (Signed)
Pt states he was calling back because you told him if no improvement in Sx you may send in Zpack--not that he has been taking medication--please advise

## 2014-11-19 NOTE — Telephone Encounter (Signed)
Who gave him a zpack? I didn't.

## 2014-11-19 NOTE — Telephone Encounter (Signed)
azithromax sent to pharmacy

## 2014-11-19 NOTE — Telephone Encounter (Signed)
Patient called stating that he took his Z-pak over the weekend and was told to call back today if any any better. Patient stated that he is a little better, but feels that he needs more medication. Patient stated that his cough is more active and productive/yellow, but no fever.. Patient stated that he is taking mucinex and cough medication.  Patient requested more medication be sent to the pharmacy. Pharmacy [facility]

## 2014-11-21 ENCOUNTER — Encounter: Payer: Self-pay | Admitting: Cardiology

## 2014-11-27 ENCOUNTER — Encounter: Payer: Self-pay | Admitting: Internal Medicine

## 2014-11-30 ENCOUNTER — Ambulatory Visit (INDEPENDENT_AMBULATORY_CARE_PROVIDER_SITE_OTHER)
Admission: RE | Admit: 2014-11-30 | Discharge: 2014-11-30 | Disposition: A | Discharge status: Home / Self Care | Payer: Medicare Other | Source: Ambulatory Visit | Attending: Internal Medicine | Admitting: Internal Medicine

## 2014-11-30 ENCOUNTER — Encounter: Payer: Self-pay | Admitting: Internal Medicine

## 2014-11-30 ENCOUNTER — Ambulatory Visit (INDEPENDENT_AMBULATORY_CARE_PROVIDER_SITE_OTHER): Payer: Medicare Other | Admitting: Internal Medicine

## 2014-11-30 VITALS — BP 140/88 | HR 67 | Temp 98.3°F | Wt 202.0 lb

## 2014-11-30 DIAGNOSIS — J441 Chronic obstructive pulmonary disease with (acute) exacerbation: Secondary | ICD-10-CM | POA: Diagnosis not present

## 2014-11-30 DIAGNOSIS — J209 Acute bronchitis, unspecified: Secondary | ICD-10-CM

## 2014-11-30 DIAGNOSIS — R05 Cough: Secondary | ICD-10-CM | POA: Diagnosis not present

## 2014-11-30 DIAGNOSIS — R0602 Shortness of breath: Secondary | ICD-10-CM | POA: Diagnosis not present

## 2014-11-30 MED ORDER — LEVOFLOXACIN 500 MG PO TABS: 500.0000 mg | ORAL_TABLET | Freq: Every day | ORAL | Status: DC

## 2014-11-30 MED ORDER — PREDNISONE 10 MG PO TABS: ORAL_TABLET | ORAL | Status: DC

## 2014-11-30 NOTE — Patient Instructions (Signed)

## 2014-11-30 NOTE — Progress Notes (Signed)
HPI  Pt presents to the clinic today with c/o worsening cough and chest congestion. He was seen for the same 8/24. He was diagnosed with a viral URI. He was instructed to take Mucinex and Delsym. He called back a few days later, reporting that he was worse. I called in a Zpack. He took the medication as prescribed. He feels like his symptoms are worse. The cough is still productive of yellow mucous. He can hear himself wheezing, especially when he lays down at night. He is short of breath with exertion but denies chest pain or chest tightness. He has not taken anything additional OTC. He does have COPD but he no longer smokes. He has not had sick contacts that he is aware of.  Review of Systems      Past Medical History  Diagnosis Date  . COPD (chronic obstructive pulmonary disease)   . CAD (coronary artery disease)   . 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 Automatic implantable cardiac defibrillator in situ   . Hyperlipidemia   . Borderline diabetes mellitus   . GERD (gastroesophageal reflux disease)   . Colonic polyp 2004    hyperplastic   . History of renal insufficiency syndrome   . Stroke   . Seizure disorder   . Monoclonal gammopathy   . Arthritis   . Atrial fibrillation   . HTN (hypertension)     Family History  Problem Relation Age of Onset  . Heart attack Brother   . Colon cancer Brother   . Heart attack Mother     Social History   Social History  . Marital Status: Single    Spouse Name: N/A  . Number of Children: 1  . Years of Education: N/A   Occupational History  . retired    Social History Main Topics  . Smoking status: Former Smoker -- 0.50 packs/day for 15 years    Types: Cigarettes    Quit date: 03/23/2004  . Smokeless tobacco: Never Used  . Alcohol Use: 0.6 oz/week    1 Glasses of wine per week     Comment: rare--wine  . Drug Use: No  . Sexual Activity: Not on file   Other Topics Concern  . Not on file   Social History  Narrative   ICD-St. Jude  Remote-Yes    Allergies  Allergen Reactions  . Lipitor [Atorvastatin]     REACTION: pt states rash from Lipitor     Constitutional:  Denies headache, fatigue, fever or abrupt weight changes.  HEENT:  Denies eye redness, eye pain, pressure behind the eyes, facial pain, nasal congestion, ear pain, ringing in the ears, wax buildup, runny nose or sore throat. Respiratory: Positive cough and shortness of breath. Denies difficulty breathing.  Cardiovascular: Denies chest pain, chest tightness, palpitations or swelling in the hands or feet.   No other specific complaints in a complete review of systems (except as listed in HPI above).  Objective:   BP 140/88 mmHg  Pulse 67  Temp(Src) 98.3 F (36.8 C) (Oral)  Wt 202 lb (91.627 kg)  SpO2 96% Wt Readings from Last 3 Encounters:  11/30/14 202 lb (91.627 kg)  11/14/14 204 lb 4 oz (92.647 kg)  11/06/14 205 lb (92.987 kg)     General: Appears his stated age, in NAD. HEENT: Head: normal shape and size; Eyes: sclera white, no icterus, conjunctiva pink; Ears: Tm's gray and intact, normal light reflex; Throat/Mouth:  Teeth present, mucosa pink and moist, no exudate noted,  no lesions or ulcerations noted.  Neck: No cervical lymphadenopathy.  Cardiovascular: Normal rate and rhythm. S1,S2 noted.  No murmur, rubs or gallops noted.  Pulmonary/Chest: Normal effort and scattered rhonchi in the LUL, LLL. Diminished in the RUL, RML, RLL. Intermittent expiratory wheezing noted. No respiratory distress.      Assessment & Plan:   COPD exacerbation:  Get some rest and drink plenty of water Chest xray today to r/o pna eRx for Levaquin 500 mg daily x 7 days eRx for Prednisone Taper over 6 days Delsym OTC for cough  RTC as needed or if symptoms persist.

## 2014-11-30 NOTE — Progress Notes (Signed)
Pre visit review using our clinic review tool, if applicable. No additional management support is needed unless otherwise documented below in the visit note. 

## 2014-12-03 ENCOUNTER — Telehealth: Payer: Self-pay | Admitting: Internal Medicine

## 2014-12-03 NOTE — Telephone Encounter (Signed)
Pt stopped by the office to find out the results from his x-ray on Friday 11/30/14. If you could please give him a call back with results. Pts call back number: [phone].

## 2014-12-04 NOTE — Telephone Encounter (Signed)
Refer to lab note °

## 2014-12-06 ENCOUNTER — Encounter: Payer: Self-pay | Admitting: Internal Medicine

## 2014-12-06 ENCOUNTER — Ambulatory Visit (INDEPENDENT_AMBULATORY_CARE_PROVIDER_SITE_OTHER): Payer: Medicare Other | Admitting: Internal Medicine

## 2014-12-06 VITALS — BP 138/88 | HR 82 | Temp 98.2°F | Wt 202.0 lb

## 2014-12-06 DIAGNOSIS — J449 Chronic obstructive pulmonary disease, unspecified: Secondary | ICD-10-CM

## 2014-12-06 MED ORDER — FLUTICASONE PROPIONATE HFA 110 MCG/ACT IN AERO: 1.0000 | INHALATION_SPRAY | Freq: Two times a day (BID) | RESPIRATORY_TRACT | Status: DC

## 2014-12-06 NOTE — Patient Instructions (Signed)
Cough, Adult  A cough is a reflex that helps clear your throat and airways. It can help heal the body or may be a reaction to an irritated airway. A cough may only last 2 or 3 weeks (acute) or may last more than 8 weeks (chronic).  CAUSES Acute cough:  Viral or bacterial infections. Chronic cough:  Infections.  Allergies.  Asthma.  Post-nasal drip.  Smoking.  Heartburn or acid reflux.  Some medicines.  Chronic lung problems (COPD).  Cancer. SYMPTOMS   Cough.  Fever.  Chest pain.  Increased breathing rate.  High-pitched whistling sound when breathing (wheezing).  Colored mucus that you cough up (sputum). TREATMENT   A bacterial cough may be treated with antibiotic medicine.  A viral cough must run its course and will not respond to antibiotics.  Your caregiver may recommend other treatments if you have a chronic cough. HOME CARE INSTRUCTIONS   Only take over-the-counter or prescription medicines for pain, discomfort, or fever as directed by your caregiver. Use cough suppressants only as directed by your caregiver.  Use a cold steam vaporizer or humidifier in your bedroom or home to help loosen secretions.  Sleep in a semi-upright position if your cough is worse at night.  Rest as needed.  Stop smoking if you smoke. SEEK IMMEDIATE MEDICAL CARE IF:   You have pus in your sputum.  Your cough starts to worsen.  You cannot control your cough with suppressants and are losing sleep.  You begin coughing up blood.  You have difficulty breathing.  You develop pain which is getting worse or is uncontrolled with medicine.  You have a fever. MAKE SURE YOU:   Understand these instructions.  Will watch your condition.  Will get help right away if you are not doing well or get worse. Document Released: 09/05/2010 Document Revised: 06/01/2011 Document Reviewed: 09/05/2010 Exit[employer] Patient Information 2015 [employer]. This information is not intended  to replace advice given to you by your health care provider. Make sure you discuss any questions you have with your health care provider.  

## 2014-12-06 NOTE — Progress Notes (Signed)
Pre visit review using our clinic review tool, if applicable. No additional management support is needed unless otherwise documented below in the visit note. 

## 2014-12-06 NOTE — Progress Notes (Signed)
Subjective:    Patient ID: Brad Brown, male    DOB: 1945-04-27, 69 y.o.   MRN: [id number]  HPI  Pt presents to the clinic today with cough and chest congestion. This is the 3rd time he has been seen for this in the last 3 weeks. He was treated for a COPD exacerbation with Levaquin and Prednisone. He did not some improvement while on the medications but symptoms have returned. He has a chest xray which showed bibasilar scarring, otherwise normal. His symptoms seems worse when he lays down. He feels like he is wheezing and his chest is tight. He has coughing fits, but the cough is unproductive. He denies shortness of breath or dyspnea with exertion. Nothing seems to make it better or worse. He has seen a pulmonologist in the past, Dr. Lenna Gilford. Note from 07/2013 reviewed.  Review of Systems      Past Medical History  Diagnosis Date  . COPD (chronic obstructive pulmonary disease)   . CAD (coronary artery disease)   . 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 Automatic implantable cardiac defibrillator in situ   . Hyperlipidemia   . Borderline diabetes mellitus   . GERD (gastroesophageal reflux disease)   . Colonic polyp 2004    hyperplastic   . History of renal insufficiency syndrome   . Stroke   . Seizure disorder   . Monoclonal gammopathy   . Arthritis   . Atrial fibrillation   . HTN (hypertension)     Current Outpatient Prescriptions  Medication Sig Dispense Refill  . clotrimazole-betamethasone (LOTRISONE) cream Apply to groin rash 2 times daily 30 g 1  . glipiZIDE (GLUCOTROL) 5 MG tablet TAKE 1 TABLET TWICE A DAY BEFORE A MEAL 180 tablet 1  . levETIRAcetam (KEPPRA) 500 MG tablet Take one tablet by mouth in the morning and two tablets by mouth at night    . losartan (COZAAR) 25 MG tablet TAKE 1 TABLET (25 MG TOTAL) BY MOUTH DAILY. 90 tablet 3  . meloxicam (MOBIC) 7.5 MG tablet TAKE 1 TABLET (7.5 MG TOTAL) BY MOUTH DAILY. 90 tablet 1  . metoprolol tartrate  (LOPRESSOR) 25 MG tablet Take 12.5 mg by mouth 2 (two) times daily.    . Multiple Vitamin (MULTIVITAMIN) tablet Take 1 tablet by mouth daily.      . naftifine (NAFTIN) 1 % cream Apply 1 application topically 2 (two) times daily as needed (For  infection between toes).     . Niacin, Antihyperlipidemic, 500 MG TABS Take 500 mg by mouth daily. 30 tablet 2  . Omega-3 Fatty Acids (FISH OIL) 1200 MG CAPS Take 1 capsule by mouth daily.    . Rivaroxaban (XARELTO) 20 MG TABS tablet Take 20 mg by mouth daily with supper.    . simvastatin (ZOCOR) 20 MG tablet Take one half tablet by mouth daily at bedtime     . fluticasone (FLOVENT HFA) 110 MCG/ACT inhaler Inhale 1 puff into the lungs 2 (two) times daily. 1 Inhaler 12   No current facility-administered medications for this visit.    Allergies  Allergen Reactions  . Lipitor [Atorvastatin]     REACTION: pt states rash from Lipitor    Family History  Problem Relation Age of Onset  . Heart attack Brother   . Colon cancer Brother   . Heart attack Mother     Social History   Social History  . Marital Status: Single    Spouse Name: N/A  . Number of  Children: 1  . Years of Education: N/A   Occupational History  . retired    Social History Main Topics  . Smoking status: Former Smoker -- 0.50 packs/day for 15 years    Types: Cigarettes    Quit date: 03/23/2004  . Smokeless tobacco: Never Used  . Alcohol Use: 0.6 oz/week    1 Glasses of wine per week     Comment: rare--wine  . Drug Use: No  . Sexual Activity: Not on file   Other Topics Concern  . Not on file   Social History Narrative   ICD-St. Jude  Remote-Yes     Constitutional: Denies fever, malaise, fatigue, headache or abrupt weight changes.  HEENT: Denies eye pain, eye redness, ear pain, ringing in the ears, wax buildup, runny nose, nasal congestion, bloody nose, or sore throat. Respiratory: Pt reports cough. Denies difficulty breathing, shortness of breath, or sputum  production.   Cardiovascular: Pt reports chest tightness. Denies chest pain, palpitations or swelling in the hands or feet.   No other specific complaints in a complete review of systems (except as listed in HPI above).  Objective:   Physical Exam   BP 138/88 mmHg  Pulse 82  Temp(Src) 98.2 F (36.8 C) (Oral)  Wt 202 lb (91.627 kg)  SpO2 98% Wt Readings from Last 3 Encounters:  12/06/14 202 lb (91.627 kg)  11/30/14 202 lb (91.627 kg)  11/14/14 204 lb 4 oz (92.647 kg)    General: Appears his stated age, well developed, well nourished in NAD. HEENT: Head: normal shape and size; Eyes: sclera white, no icterus, conjunctiva pink, PERRLA and EOMs intact; Ears: Tm's gray and intact, normal light reflex; Throat/Mouth: Teeth present, mucosa pink and moist, no exudate, lesions or ulcerations noted.  Neck:  No adenopathy noted. Cardiovascular: Normal rate and rhythm. S1,S2 noted.   Pulmonary/Chest: Normal effort and with wheezing noted in the LUL. No respiratory distress. No rales or ronchi noted.    BMET    Component Value Date/Time   NA 135 11/06/2014 0957   NA 137 02/02/2014 1009   K 4.2 11/06/2014 0957   K 4.7 02/02/2014 1009   CL 103 11/06/2014 0957   CL 104 01/06/2012 1355   CO2 27 11/06/2014 0957   CO2 25 02/02/2014 1009   GLUCOSE 142* 11/06/2014 0957   GLUCOSE 127 02/02/2014 1009   GLUCOSE 77 01/06/2012 1355   BUN 13 11/06/2014 0957   BUN 13.1 02/02/2014 1009   CREATININE 1.36 11/06/2014 0957   CREATININE 1.4* 02/02/2014 1009   CREATININE 1.40* 11/04/2012 1622   CALCIUM 9.6 11/06/2014 0957   CALCIUM 10.0 02/02/2014 1009   GFRNONAA 74.56 02/03/2010 1110   GFRAA 57 02/08/2008 1040    Lipid Panel     Component Value Date/Time   CHOL 133 08/30/2014 1444   TRIG 367.0* 08/30/2014 1444   HDL 35.60* 08/30/2014 1444   CHOLHDL 4 08/30/2014 1444   VLDL 73.4* 08/30/2014 1444   LDLCALC 57 06/14/2013 1235    CBC    Component Value Date/Time   WBC 5.3 11/06/2014 0957     WBC 4.6 02/02/2014 1009   RBC 4.74 11/06/2014 0957   RBC 4.93 02/02/2014 1009   HGB 14.6 11/06/2014 0957   HGB 14.5 02/02/2014 1009   HCT 43.3 11/06/2014 0957   HCT 44.7 02/02/2014 1009   PLT 171.0 11/06/2014 0957   PLT 189 02/02/2014 1009   MCV 91.4 11/06/2014 0957   MCV 90.6 02/02/2014 1009   MCH 29.5  02/02/2014 1009   MCH 31.2 11/04/2012 1622   MCHC 33.6 11/06/2014 0957   MCHC 32.6 02/02/2014 1009   RDW 12.9 11/06/2014 0957   RDW 13.2 02/02/2014 1009   LYMPHSABS 1.4 02/02/2014 1009   LYMPHSABS 1.2 11/04/2012 1622   MONOABS 0.3 02/02/2014 1009   MONOABS 0.4 11/04/2012 1622   EOSABS 0.1 02/02/2014 1009   EOSABS 0.1 11/04/2012 1622   BASOSABS 0.1 02/02/2014 1009   BASOSABS 0.0 11/04/2012 1622    Hgb A1C Lab Results  Component Value Date   HGBA1C 6.6* 08/30/2014        Assessment & Plan:   COPD:  No need to repeat chest xray No indication for repeat antibiotics, or prednisone eRx for Flovent BID He will call Dr. Lenna Gilford to see if he can be evaluated next week Will likely need repeat PFT's but will defer to Dr. Lenna Gilford  RTC as needed or if symptoms persist or worsen

## 2014-12-07 ENCOUNTER — Ambulatory Visit (INDEPENDENT_AMBULATORY_CARE_PROVIDER_SITE_OTHER): Payer: Medicare Other | Admitting: Pulmonary Disease

## 2014-12-07 ENCOUNTER — Encounter: Payer: Self-pay | Admitting: Pulmonary Disease

## 2014-12-07 VITALS — BP 132/82 | HR 60 | Temp 97.7°F | Wt 203.2 lb

## 2014-12-07 DIAGNOSIS — I2510 Atherosclerotic heart disease of native coronary artery without angina pectoris: Secondary | ICD-10-CM

## 2014-12-07 DIAGNOSIS — Z95810 Presence of automatic (implantable) cardiac defibrillator: Secondary | ICD-10-CM

## 2014-12-07 DIAGNOSIS — I10 Essential (primary) hypertension: Secondary | ICD-10-CM | POA: Diagnosis not present

## 2014-12-07 DIAGNOSIS — I498 Other specified cardiac arrhythmias: Secondary | ICD-10-CM

## 2014-12-07 DIAGNOSIS — J411 Mucopurulent chronic bronchitis: Secondary | ICD-10-CM | POA: Diagnosis not present

## 2014-12-07 DIAGNOSIS — I482 Chronic atrial fibrillation, unspecified: Secondary | ICD-10-CM

## 2014-12-07 DIAGNOSIS — J449 Chronic obstructive pulmonary disease, unspecified: Secondary | ICD-10-CM

## 2014-12-07 DIAGNOSIS — Q248 Other specified congenital malformations of heart: Secondary | ICD-10-CM

## 2014-12-07 MED ORDER — BUDESONIDE-FORMOTEROL FUMARATE 160-4.5 MCG/ACT IN AERO: 2.0000 | INHALATION_SPRAY | Freq: Two times a day (BID) | RESPIRATORY_TRACT | Status: DC

## 2014-12-07 MED ORDER — METHYLPREDNISOLONE 8 MG PO TABS: ORAL_TABLET | ORAL | Status: DC

## 2014-12-07 MED ORDER — LEVALBUTEROL HCL 0.63 MG/3ML IN NEBU
0.6300 mg | INHALATION_SOLUTION | Freq: Once | RESPIRATORY_TRACT | Status: AC
  Administered 2014-12-07: 0.63 mg via RESPIRATORY_TRACT

## 2014-12-07 MED ORDER — METHYLPREDNISOLONE ACETATE 80 MG/ML IJ SUSP
80.0000 mg | Freq: Once | INTRAMUSCULAR | Status: AC
  Administered 2014-12-07: 80 mg via INTRAMUSCULAR

## 2014-12-07 NOTE — Progress Notes (Signed)
Subjective:    Patient ID: Brad Brown, male    DOB: [birth year], 69 y.o.   MRN: [id number]  HPI 69 y/o BM here for a follow up visit... he has mult med problems as noted & he has mult specialty physicians tending to his medical needs... he gets his meds from the [facility]... ~  SEE PREV EPIC NOTES FOR OLDER DATA >>    LABS 10/13 by DrShadad> reviewed in EPIC...  LABS 9/13 by [name]> scanned into EPIC...   ~  October 17, 2012:  45mo ROV & Glenn had an AICD shock 3/14- seen by [name] w/ interrogation revealing inapprop shock for rapid AFib=> cardioverted; I note he has not had a 2DEcho in many yrs; EP felt he needed anticoag instead of antiplatlet Rx- initially started on Xarelto, then changed to Coumadin, now back on Xarelto apparently because the [place] has just started covering for this med... They also incr his Metoprolol to 25mg  Bid, he is maintaining NSR... He last saw [name] in 2012 & was told to f/u as needed... We reviewed the following medical problems during today's office visit >>     COPD> he had a mild exac10/13 treated by TP w/ ZPak, Depo80, Mucinex, Saline & resolved; no prob since then...    HBP> on Metop25Bid; BP=142/78 & he denies CP, palpit, dizzy, syncope, SOB, edema, etc...    CAD, Brugada syndrome, new PAF> off Plavix75 now & on XARELTO15- see above; followed by [name]- his notes are reviewed...    Chol> on Simva40-1/2 tab; last FLP 6/14 at [facility] showed TChol 152, TG 274, HDL 44, LDL 53; needs better low fat diet...    DM> on Glyburide2.5; last labs 6/14 at [facility] revealed BS= 97, A1c= 6.1    Renal Insuffic> followed by [name] & Creat has been stable in the 1.1-1.3 range; labs 6/14 from [place] showed BUN=21, Creat=1.5    Hx Stroke> off Aggrenox & on XARELTO15 now; he denies any cerebral ischemic symptoms...    Hx Seizure> on Keppra500Tid; doing well w/o recurrent seizures on the med...    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 We reviewed  prob list, meds, xrays and labs> see below for updates >>   LABS 6/14 from [facility] scanned into EPIC...    ~  December 14, 2012:  Pt called today requesting OV to check groin rash> states it's been present for 3-4d rash, raw, irritated & pruritic, in the intertrig folds & he's tried a salve he got "at the dollar store";  Exam shows characteristic rash of cutaneous candida & is sl excoriated etc;  We discussed Rx w/ Diflucan orally & Lotrisone cream topically, +local care/ keep skin off skin/ etc... If not resolved we will send him to [facility]...    He notes that his breathing is good, BP=136/84, no CP/ palpit/ SOB/ edema, DM control has been good on meds, etc;  He is reminded to drink lots of water...  ~  June 14, 2013:  2mo ROV & Brad Brown notes pain/arthritis in hands, hot soaks help some, wants med- try Mobic7.5mg  prn...  He had a URI 2/15 & saw TP> treated w/ ZPak & Hydromet w/ improvement... We reviewed the following medical problems during today's office visit >>     COPD> recent exac 2/15 treated w/ ZPak, Mucinex & Hydromet- resolved; no prob since then states breathing is ok- denies cough, sput, dyspnea, CP, etc...    HBP> on Metop25Bid; BP=138/94 & he denies  CP, palpit, dizzy, syncope, SOB, edema, etc; wt is up to 204 & reminded about diet, exercise & wt reduction...    CAD, Brugada syndrome, new PAF> on XARELTO20- see above; followed by [name]- his notes are reviewed...    Chol> on Simva40-1/2 tab; last FLP 3/15 here showed TChol 159, TG 273, HDL 47, LDL 57; needs better low fat diet & wt reduction...    DM> on Glyburide2.5; labs 3/15 here revealed BS= 104, A1c= 6.9 and we reviewed diet, exercise 7 wt reduction needed...    Renal Insuffic> followed by [name] & Creat has been stable in the 1.1-1.3 range (Stage2); labs 3/15 here showed BUN=13, Creat=1.4    GU> followed by DrTannenbaum on Trimix injections and Cialis; he knows to be very careful w/ shots in light of his Xarelto...    Hx Stroke>  on [place]; he denies any cerebral ischemic symptoms...    Hx Seizure> on Keppra500Tid; doing well w/o recurrent seizures on the med...    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 We reviewed prob list, meds, xrays and labs> see below for updates >>   LABS 3/15:  FLP- ok x TG=273 on Simva20;  Chems- ok x BS=104, A1c=6.9, Cr=1.4 on Glybur2.5;  TSH=1.92;  PSA=0.68  ~  Jul 25, 2013:  6wk ROV & add-on appt requested for 2wk hx cough, yellow mucus, chest congestion & wheezing noted; Exam is c/w AB & we decided to treat w/ Depo80, dosepak, Augmentin 875Bid, Mucinex, Align, etc...    He had a follow up appt w/ [name] 5/15> CAD, ischemic cardiomyop, chr sys CHF, s/p ICD implantation (devise check was ok), Brugada syndrome; on Metop25Bid, Losar25, Xarelto20; BP improved 134/86, denies CP, palpit, SOB, edema...    Since he was here last- Sonya has seen GI regarding f/u colonoscopy> +fam hx of colon ca in brother, last colonoscopy 2004 by DrStark, exam was neg & colon sched for 08/17/13... We reviewed prob list, meds, xrays and labs> see below for updates >>   ~  December 07, 2014:  75mo ROV & add-on appt requested for 64mo hx of cough, chest congestion & wheezing> Brad Brown is followed by the PCPs at [facility] office, and by [employer] for Cards-EP (CAD, cardiomyop, PAF, Brugada syndrome, s/p AICD), and DrShadad Heme/Onc for MGUS;  He was seen by Webb Silversmith at [facility] yest w/ cough & chest congestion- 3rd visit in 3 weeks for similar symptoms; c/o chest tightness & wheezing, coughing in paroxysms, but not much sput; treated for COPD exac w/ Levaquin/ Pred, CXR & labs below, they added Flovent & asked [place] to check the pt...     EXAM shows Afeb, VSS, O2sat=97% on RA;  HEENT- neg, mallampati2;  Chest- few scar rhonchi w/o w/r/consolidation;  Heart- RR gr1/6 SEM w/o r/g;  Abd- soft, neg;  Ext- w/o c/c/e;  Neuro- unusual affect, no focal abn...  CXR 11/30/14 showed norm  heart size, AICD i left chest, clear lungs x some right basilar pleuroparenchymal scarring, NAD...  Spirometry machine is down & unable to obtain today...  LABS 8/16:  Chems- ok x Cr=1.36, BS=142, A1c=6.6;  CBC- wnl IMP/PLAN>>  I agree that Brad Brown appears to have a COPD exac that has been refractory to management so far; we gave him a NEB rx in the office & I propose that we treat him w/ Depo80, Medrol taper (see AVS), Mucinex600-2Bid w/ fluids and start SYMBICORT160-2spBid instead of the Flovent; continue other meds  the same... I'd be happy to recheck him in the future at PCP discretion.           Problem List:  COPD - exsmoker quit >5 years now "I learned my lesson"... no regular meds... denies recurrent symptoms~ denies cough, sputum, hemoptysis, worsening dyspnea, wheezing, chest pain, snoring, daytime hypersomnolence, etc... ~  CXR 5/11 showed borderline cardiomeg, sl hyperinflation, clear lungs, pacer w/ AICD lead w/o change... ~  He had a mild exac10/13 treated by TP w/ ZPak, Depo80, Mucinex, Saline & resolved; no prob since then... ~  He had another mild exac 2/15 treated w/ ZPak, Mucinex, Hydromet & improved... ~  5/15: presented w/ AB exac treated w/ Depo80, dosepak, Augmentin, Mucinex, etc...    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   2DEcho 8/05 showed norm LVF w/ EF=50-55% but AK in inferobasialr wall, sl thickening of AoV&MV w/ trivAI & MR, LA mildly dilated, mild atheroma in desc Ao...  Myoview 2/08 showed sm inferolat infarct w/o ischemia & EF=59%... hosp 5/08 w/ seizure &  EKG showing Brugada syndrome- cath w/ normal coronaries, good LVF, and defibrillator implanted...  he remains on ASA, & METOPROLOL 25mg - 1/2Bid... ~  5/10: BP= 138/80 & even better at home, he says... no CP, palpit, dizzy, etc... ~  11/10: BP= 128/80 & feeling well- no CP, palpit, dizzy, etc... ~  5/11:  BP= 132/78 & feeling well- no complaints or concerns... ~  11/11:  BP= 120/80 & he is asymptomatic... ~  5/12:  BP= 132/76 & he continues asymptomatic... ~  11/12:  BP= 118/82 & he remains asymptomatic... ~  EKG 11/12 showed SBrady, rate59, IVCD/ pseudo-RBBB, otherw neg EKG... ~  5/13:  BP= 130/80 & feeling well w/o CP, palpit, SOB, edema, etc... ~  9/13:  He had f/u [name]- followed for non-ischemic cardiomyop, chr sys heart failure, HBP, etc; doing well w/ ICD- no discharges, stable, rec to continue follow up... ~  3/14:  Brad Brown had an AICD shock 3/14- seen by [name] w/ interrogation revealing inapprop shock for rapid AFib=> cardioverted; EP felt he needed anticoag instead of antiplatlet Rx- initially started on Xarelto, then changed to Coumadin, now back on Xarelto apparently because the [place] has just started covering for this med... They also incr his Metoprolol to 25mg  Bid, he is maintaining NSR... He last saw [name] in 2012 & was told to f/u as needed; I note he has not had a 2DEcho in many yrs... ~  7/14:  BP= 142/78 & holding NSR on Metop25Bid & Xarelto15; he denies CP, notes occas palpit, denies SOB, edema, etc... ~  3/15:  on Metop25Bid & XARELTO20- BP=138/94 but better at home he says, followed by [name]- his notes are reviewed... ~  FOLLOWED by [name] & [name] for CARDS >>   HYPERLIPIDEMIA - prev w/ fair control on Antara, but [facility] switched him to SIMVASTATIN 20mg /d. ~  FLP 5/09 showed TChol 121, TG 93, HDL 36, LDL 67 ~  FLP 5/10 on Antara130 showed TChol 143, TG 140, HDL 45, LDL 70 ~  FLP 11/10 on Antara130 showed TChol 143, TG 194, HDL 46, LDL 59... same  med, better diet; but [facility] ch to Simva20. ~  FLP 5/11 on Simva20 showed TChol 122, TG 193, HDL 41, LDL 43 ~  FLP 11/11  on Simva20 showed TChol 137, TG 228, HDL 42, LDL 50... needs better low fat diet. ~  FLP 5/12 on Simva20 showed Tchol 141, TG 192, HDL 42, LDL 61 ~  FLP 11/12 on Simva20 showed TChol 133, TG 189, HDL 45, LDL 50... Continue same & better low fat diet. ~  FLP 5/13 on Simva20 showed TChol 112, TG 144, HDL 45, LDL 38 ~  FLP 6/14 at the [place] on Simva20 showed TChol 152, TG 274, HDL 44, LDL 53; needs better low fat diet.  ~  FLP 3/15 on Simva20 showed TChol 159, TG 273, HDL 47, LDL 57; needs better low fat diet & wt reduction.  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 ~  labs 11/09 on diet showed BS= 116, A1c= 7.1.Marland Kitchen. rec> start Metformin 500mg /d. ~  labs 5/10 on Metform500 showed BS= 96, A1c= 6.1.Marland KitchenMarland Kitchen continue same. ~  [facility] changed pt to Gimepiride 2mg /d... ~  labs 11/10 on Glimep2 showed BS= 78, A1c= 5.9.Marland Kitchen. rec> decr Glimep to 1/2 tab in AM; [facility] ch to Glybur2.5mg . ~  labs 5/11 on Glybur2.5- 1/2 tab showed BS= 79, A1c= 6.0 ~  labs 11/11 on Glybur2.5-1/2tab showed BS= 99, A1c= 6.4 ~  Labs 5/12 on Glybur2.5-1/2tab showed BS= 95, A1c=6.5 ~  Labs 11/12 on Glybur2.5-1/2tab showed BS= 83, A1c= 6.3 ~  1/13:  He had Eye eval DrTanner- neg, no retinopathy... ~  Labs 5/13 on Glybur2.5-1/2tab showed BS= 97, A1c= 6.2 ~  12/13: note from [place], Ophthalmology- no DM retinopathy... ~  7/14: on Glyburide2.5; last labs 6/14 at [facility] revealed BS= 97, A1c= 6.1 ~  12/14: note from [place], Ophthalmology- no DM retinopathy... ~  3/15: on Glyburide2.5; labs 3/15 here revealed BS= 104, A1c= 6.9 and we reviewed diet, exercise & wt reduction needed... ~  DM MANAGED by PCP at [facility] office >>   POS FAMILY HX OF COLON CA IN Brother >>  ~  Brad Brown has had prev colonoscopies by DrStark 1/99 & 4/04> he chart was rechecked by GI 4/15 & that note is reviewed- sche for f/u  colonoscopy 08/17/13... ~  FOLLOWED FOR GI by DrStark >>   Hx of RENAL INSUFFICIENCY -  ~  baseline Creat = 1.8; he has hx of renal insuffic related to ACE/ ARB therapy & improved to baseline off this Rx; followed  by [facility]- notes & labs reviewed. ~  labs 2/09 by DrShadad w/ Cr=1.6 ~  labs 9/09 by [name] showed BUN= 20, Creat= 1.65 ~  labs 11/09 here showed BUN= 12, Creat= 1.6 ~  labs 5/10 showed BUN= 14, creat= 1.2 ~  labs 11/10 showed BUN= 14, Creat= 1.1 ~  labs 5/11 showed BUN= 13, Creat= 1.2 ~  Labs 5/12 showed BUN= 12, Creat= 1.2 ~  Labs 11/12 showed BUN= 16, Creat= 1.2 ~  Labs 5/13 showed BUN=13, Creat= 1.3  ~  10/13: he had yearly f/u [name], Creat=1.2 stable, goals discussed w/ pt, note from Nephrology reviewed... ~  7/14:  followed by [name] & Creat has been stable in the 1.1-1.3 range; labs 6/14 from [place] showed BUN=21, Creat=1.5 ~  11/14: he had f/u [name] & his note is reviewed... ~  followed by [name] & Creat has been stable in the 1.1-1.3 range (Stage2); labs 3/15 here showed BUN=13, Creat=1.4 ~  FOLLOWED by [facility], DrColodonato & UROLOGY, DrTannenbaum >>   Hx BPH & Hx ED >> followed by Urology & he has tried Cialis- both prn & daily use;  He has Primix shots for ED but he tells me that he is not using this med... ~  12/13: he had yearly f/u DrTannenbaum> bladder neck obstruction & ED on Cialis5mg /d & PEP injections (TRIMIX) ~  12/14: he had f/u DrTannenbaum> pt injects TRIMIX & takes Cialis for ED, told to be very careful in light of his Xarelto,  ~  Labs here 3/15 showed PSA= 0.68  Hx STROKE & SEIZURE DISORDER - eval by neuro/ DrSethi...  ~  on ASA 81mg Bid,  AGGRENOX Bid, & KEPPRA 500mg Bid... he is stable without focal weakness, sensory changes, speech problems, etc...  ~  5/10 he denies memory problems, but in need of additional evaluation, DrSethi's note 12/09 doesn't indicate difficulty in this area. ~  11/10: continues to  deny problems... ~  5/11:  states he had a recent seizure and is due for f/u w/ DrSethi soon... I am still concerned about his memory & affect. ~  11/11 & 5/12>  we do not have notes from Neuro to review... 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 ~  5/13: he gets all his meds filled at the [place] but didn't bring bottles or list; he thinks they changed Aggrenox to something else (?Plavix); reminded to bring all meds to every doctor visit... ~  3/14: [place] had an AICD shock 3/14- seen by [name] w/ interrogation revealing inapprop shock for rapid AFib=> cardioverted; EP felt he needed anticoag instead of antiplatlet Rx- initially started on Xarelto, then changed to Coumadin, now back on Xarelto apparently because the [place] has just started covering for this med. ~  FOLLOWED by NEUOLOGY, DrSethi >>   MONOCLONAL GAMMOPATHY - full eval from DrShadad and latest notes reviewed... of interest the pt tells me he has been a regular blood donor and freq gave "double units"... 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 he has some free kappa light chains in the urine as well... ~  8/08:  Neg metastatic bone survey; mild compression T6, degenerative cerv spondylosis... ~  10/11:  f/u DrShadad w/ extensive labs reviewed> no evid progression, continues on observation. ~  11/12:  Now that DrShadad et al are in the Epic system his notes & labs are avail to review (yearly f/u each fall). 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 ~  11/14: he had f/u  DrShadad> MGUS, IgG kappa, on surveillance & stable w/o signif change in his labs (reviewed in Epic)... ~  FOLLOWED by HEME, DrShadad >>    Past Surgical History  Procedure Laterality Date  . Cardiac catheterization  1987    Showed distal left circumflex 100% occluded   . Cardiac defibrillator placement  08/17/2006  Implantation of a [employer] single chamber defibrillator  . Inguinal hernia repair Left     Outpatient Encounter Prescriptions as of 12/07/2014  Medication Sig  . clotrimazole-betamethasone (LOTRISONE) cream Apply to groin rash 2 times daily  . fluticasone (FLOVENT HFA) 110 MCG/ACT inhaler Inhale 1 puff into the lungs 2 (two) times daily.  Marland Kitchen glipiZIDE (GLUCOTROL) 5 MG tablet TAKE 1 TABLET TWICE A DAY BEFORE A MEAL  . levETIRAcetam (KEPPRA) 500 MG tablet Take one tablet by mouth in the morning and two tablets by mouth at night  . losartan (COZAAR) 25 MG tablet TAKE 1 TABLET (25 MG TOTAL) BY MOUTH DAILY.  . meloxicam (MOBIC) 7.5 MG tablet TAKE 1 TABLET (7.5 MG TOTAL) BY MOUTH DAILY.  . metoprolol tartrate (LOPRESSOR) 25 MG tablet Take 12.5 mg by mouth 2 (two) times daily.  . Multiple Vitamin (MULTIVITAMIN) tablet Take 1 tablet by mouth daily.    . naftifine (NAFTIN) 1 % cream Apply 1 application topically 2 (two) times daily as needed (For  infection between toes).   . Niacin, Antihyperlipidemic, 500 MG TABS Take 500 mg by mouth daily.  . Omega-3 Fatty Acids (FISH OIL) 1200 MG CAPS Take 1 capsule by mouth daily.  . Rivaroxaban (XARELTO) 20 MG TABS tablet Take 20 mg by mouth daily with supper.  . simvastatin (ZOCOR) 20 MG tablet Take one half tablet by mouth daily at bedtime   . budesonide-formoterol (SYMBICORT) 160-4.5 MCG/ACT inhaler Inhale 2 puffs into the lungs 2 (two) times daily.  . methylPREDNISolone (MEDROL) 8 MG tablet Take as directed  . [EXPIRED] levalbuterol (XOPENEX) nebulizer solution 0.63 mg   . [EXPIRED] methylPREDNISolone acetate (DEPO-MEDROL) injection 80 mg     No facility-administered encounter medications on file as of 12/07/2014.    Allergies  Allergen Reactions  . Lipitor [Atorvastatin]     REACTION: pt states rash from Lipitor    Current Medications, Allergies, Past Medical History, Past Surgical History, Family History, and Social History were reviewed in [employer] record.   Review of Systems        See HPI - all other systems neg except as noted... The patient denies anorexia, fever, weight loss, weight gain, vision loss, decreased hearing, hoarseness, chest pain, syncope, dyspnea on exertion, peripheral edema, prolonged cough, headaches, hemoptysis, abdominal pain, melena, hematochezia, severe indigestion/heartburn, hematuria, incontinence, muscle weakness, suspicious skin lesions, transient blindness, difficulty walking, depression, unusual weight change, abnormal bleeding, enlarged lymph nodes, and angioedema.     Objective:   Physical Exam    WD, WN, 69 y/o BM in NAD... GENERAL:  Alert & oriented; pleasant & cooperative... HEENT:  [place]/AT, EOM-wnl, PERRLA, EACs-clear, TMs-wnl, NOSE-clear, THROAT-clear & wnl. NECK:  Supple w/ fairROM; no JVD; normal carotid impulses w/o bruits; no thyromegaly or nodules palpated; no lymphadenopathy. CHEST:  Clear to P & A;  with few scat rhonchi & mild end-exp wheezing... HEART:  regular, gr 1/6 SEM without rubs or gallops heard... ABDOMEN:  Soft & nontender; normal bowel sounds; no organomegaly or masses detected. EXT: without deformities or arthritic changes; no varicose veins/ venous insuffic/ or edema. NEURO:  CN's intact; motor testing normal; sensory testing normal; gait normal & balance OK. DERM:  Prev candida rash has resolved...  RADIOLOGY DATA:  Reviewed in the EPIC EMR & discussed w/ the patient...  LABORATORY DATA:  Reviewed in the EPIC EMR & discussed w/ the patient...   Assessment & Plan:   AB exac> presented 9/16 w/ cough, sput, chest congestion;  refractory to Levaquin, Pred, Flovent from PCP; we treated w/ Neb, Depo/Medrol, Symbicort160, Mucinex... COPD>  He quit smoking >63yrs ago & is generally stable...  HBP/ CAD/ Brugada syndrome/ PAF>  followed by [name] for Cards w/ AICD- stable, on Metoprolol25Bid, Losar25, & Xarelto20...  HYPERLIPIDEMIA>  On Simva20 per the [facility] + low fat diet;  F/u FLP is ok x elevTG and we reviewed low fat diet & need for wt loss...  DM>  On Glyburide monotherapy per the [facility], discussed diet, exercise, BS monitoring at home etc; he denies hypoglycemic episodes & A1c= 6.9.Marland KitchenMarland Kitchen  RENAL>  Creat= 1.4 recently, and he continues to see [name] yearly...  Hx Stroke/ Seizure>  On Keppra & stable w/o cerebral ischemic symptoms, followed by DrSethi...  MGUS>  followed by DrShadad w/ labs each Autumn...   Patient's Medications  New Prescriptions   BUDESONIDE-FORMOTEROL (SYMBICORT) 160-4.5 MCG/ACT INHALER    Inhale 2 puffs into the lungs 2 (two) times daily.   METHYLPREDNISOLONE (MEDROL) 8 MG TABLET    Take as directed  Previous Medications   CLOTRIMAZOLE-BETAMETHASONE (LOTRISONE) CREAM    Apply to groin rash 2 times daily   FLUTICASONE (FLOVENT HFA) 110 MCG/ACT INHALER    Inhale 1 puff into the lungs 2 (two) times daily.   GLIPIZIDE (GLUCOTROL) 5 MG TABLET    TAKE 1 TABLET TWICE A DAY BEFORE A MEAL   LEVETIRACETAM (KEPPRA) 500 MG TABLET    Take one tablet by mouth in the morning and two tablets by mouth at night   LOSARTAN (COZAAR) 25 MG TABLET    TAKE 1 TABLET (25 MG TOTAL) BY MOUTH DAILY.   MELOXICAM (MOBIC) 7.5 MG TABLET    TAKE 1 TABLET (7.5 MG TOTAL) BY MOUTH DAILY.   METOPROLOL TARTRATE (LOPRESSOR) 25 MG TABLET    Take 12.5 mg by mouth 2 (two) times daily.   MULTIPLE VITAMIN (MULTIVITAMIN) TABLET    Take 1 tablet by mouth daily.     NAFTIFINE (NAFTIN) 1 % CREAM    Apply 1 application topically 2 (two) times daily as needed (For  infection between toes).    NIACIN, ANTIHYPERLIPIDEMIC, 500 MG TABS    Take  500 mg by mouth daily.   OMEGA-3 FATTY ACIDS (FISH OIL) 1200 MG CAPS    Take 1 capsule by mouth daily.   RIVAROXABAN (XARELTO) 20 MG TABS TABLET    Take 20 mg by mouth daily with supper.   SIMVASTATIN (ZOCOR) 20 MG TABLET    Take one half tablet by mouth daily at bedtime   Modified Medications   No medications on file  Discontinued Medications   No medications on file

## 2014-12-07 NOTE — Patient Instructions (Signed)
Today we updated your med list in our EPIC system...    Continue your current medications the same...  We gave you a NEB treatment in the office today...  We gave you a Depo shot 7 a new Rx for MEDROL 8mg  tabs to take as follows>    Start w/ one tab twice daily for 4 days...    Then decrease to 1 tab each AM for 4 days...    Then decrease to 1/2 tab daily each AM for 4 days...    Then decrease to 1/2 tab every other day til gone (1/2, 0, 1/2, 0, etc)...  Try the new SYMBICORT160- 2 sprays twice daily, and     add-in the OTC MUCINEX 600mg  tabs- one tab 4 times daily w/ fluids for the thick phlegm...  Call for any questions or if we can be of service in any way.Marland KitchenMarland Kitchen

## 2014-12-27 ENCOUNTER — Encounter: Payer: Self-pay | Admitting: Internal Medicine

## 2014-12-27 ENCOUNTER — Ambulatory Visit (INDEPENDENT_AMBULATORY_CARE_PROVIDER_SITE_OTHER): Payer: Medicare Other | Admitting: Internal Medicine

## 2014-12-27 VITALS — BP 140/84 | HR 65 | Temp 97.9°F | Wt 201.0 lb

## 2014-12-27 DIAGNOSIS — L03032 Cellulitis of left toe: Secondary | ICD-10-CM | POA: Diagnosis not present

## 2014-12-27 DIAGNOSIS — S90425A Blister (nonthermal), left lesser toe(s), initial encounter: Secondary | ICD-10-CM

## 2014-12-27 MED ORDER — DOXYCYCLINE HYCLATE 100 MG PO TABS: 100.0000 mg | ORAL_TABLET | Freq: Two times a day (BID) | ORAL | Status: DC

## 2014-12-27 NOTE — Progress Notes (Signed)
Pre visit review using our clinic review tool, if applicable. No additional management support is needed unless otherwise documented below in the visit note. 

## 2014-12-27 NOTE — Patient Instructions (Signed)

## 2014-12-27 NOTE — Progress Notes (Signed)
Subjective:    Patient ID: Brad Brown, male    DOB: 1945/06/13, 69 y.o.   MRN: [id number]  HPI  Pt presents to the clinic today with c/o pain and swelling of his left 5th toe. He noticed this a few days ago, after he twisted his ankle while walking his dog. He describes the pain as throbbing. He has noticed that the area is red and tender to touch. He has tried epsom salt soaks and antibiotic ointment without any relief. He does have DM 2 but denies decreased sensation in his feet. He does not do daily foot exams. He has never had gout that he is aware of.  Review of Systems      Past Medical History  Diagnosis Date  . COPD (chronic obstructive pulmonary disease) ([place])   . CAD (coronary artery disease)   . 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 Automatic implantable cardiac defibrillator in situ   . Hyperlipidemia   . Borderline diabetes mellitus   . GERD (gastroesophageal reflux disease)   . Colonic polyp 2004    hyperplastic   . History of renal insufficiency syndrome   . Stroke ([place])   . Seizure disorder ([place])   . Monoclonal gammopathy   . Arthritis   . Atrial fibrillation ([place])   . HTN (hypertension)     Current Outpatient Prescriptions  Medication Sig Dispense Refill  . budesonide-formoterol (SYMBICORT) 160-4.5 MCG/ACT inhaler Inhale 2 puffs into the lungs 2 (two) times daily. 1 Inhaler 6  . clotrimazole-betamethasone (LOTRISONE) cream Apply to groin rash 2 times daily 30 g 1  . fluticasone (FLOVENT HFA) 110 MCG/ACT inhaler Inhale 1 puff into the lungs 2 (two) times daily. 1 Inhaler 12  . glipiZIDE (GLUCOTROL) 5 MG tablet TAKE 1 TABLET TWICE A DAY BEFORE A MEAL 180 tablet 1  . levETIRAcetam (KEPPRA) 500 MG tablet Take one tablet by mouth in the morning and two tablets by mouth at night    . losartan (COZAAR) 25 MG tablet TAKE 1 TABLET (25 MG TOTAL) BY MOUTH DAILY. 90 tablet 3  . meloxicam (MOBIC) 7.5 MG tablet TAKE 1 TABLET (7.5 MG TOTAL) BY  MOUTH DAILY. 90 tablet 1  . methylPREDNISolone (MEDROL) 8 MG tablet Take as directed 16 tablet 0  . metoprolol tartrate (LOPRESSOR) 25 MG tablet Take 12.5 mg by mouth 2 (two) times daily.    . Multiple Vitamin (MULTIVITAMIN) tablet Take 1 tablet by mouth daily.      . naftifine (NAFTIN) 1 % cream Apply 1 application topically 2 (two) times daily as needed (For  infection between toes).     . Niacin, Antihyperlipidemic, 500 MG TABS Take 500 mg by mouth daily. 30 tablet 2  . Omega-3 Fatty Acids (FISH OIL) 1200 MG CAPS Take 1 capsule by mouth daily.    . Rivaroxaban (XARELTO) 20 MG TABS tablet Take 20 mg by mouth daily with supper.    . simvastatin (ZOCOR) 20 MG tablet Take one half tablet by mouth daily at bedtime     . doxycycline (VIBRA-TABS) 100 MG tablet Take 1 tablet (100 mg total) by mouth 2 (two) times daily. 20 tablet 0   No current facility-administered medications for this visit.    Allergies  Allergen Reactions  . Lipitor [Atorvastatin]     REACTION: pt states rash from Lipitor    Family History  Problem Relation Age of Onset  . Heart attack Brother   . Colon cancer Brother   .  Heart attack Mother     Social History   Social History  . Marital Status: Single    Spouse Name: N/A  . Number of Children: 1  . Years of Education: N/A   Occupational History  . retired    Social History Main Topics  . Smoking status: Former Smoker -- 0.50 packs/day for 15 years    Types: Cigarettes    Quit date: 03/23/2004  . Smokeless tobacco: Never Used  . Alcohol Use: 0.6 oz/week    1 Glasses of wine per week     Comment: rare--wine  . Drug Use: No  . Sexual Activity: Not on file   Other Topics Concern  . Not on file   Social History Narrative   ICD-St. Jude  Remote-Yes     Constitutional: Denies fever, malaise, fatigue, headache or abrupt weight changes.  Respiratory: Denies difficulty breathing, shortness of breath, cough or sputum production.   Cardiovascular: Denies  chest pain, chest tightness, palpitations or swelling in the hands or feet.  Musculoskeletal: Pt reports ;eft 5th toe pain. Denies decrease in range of motion, muscle pain or joint swelling.  Skin: Pt reports redness of left foot. Denies rashes, lesions or ulcercations.    No other specific complaints in a complete review of systems (except as listed in HPI above).  Objective:   Physical Exam  BP 140/84 mmHg  Pulse 65  Temp(Src) 97.9 F (36.6 C) (Oral)  Wt 201 lb (91.173 kg)  SpO2 98% Wt Readings from Last 3 Encounters:  12/27/14 201 lb (91.173 kg)  12/07/14 203 lb 3.2 oz (92.171 kg)  12/06/14 202 lb (91.627 kg)   General: Appears his stated age, well developed, well nourished in NAD. Skin: Redness noted of 5th toe, extending up dorsal surface of midfoot. Not warm to touch. He has a purulent blister noted underneath left 5th toe. Cardiovascular: Normal rate and rhythm. S1,S2 noted.   Pulmonary/Chest: Normal effort and positive vesicular breath sounds. No respiratory distress. No wheezes, rales or ronchi noted.  Musculoskeletal: Normal flexion, extension of the left 5th toe. Nontender to palpation. Normal gait. Neurological: Alert and oriented. Sensation intact to BLE.  BMET    Component Value Date/Time   NA 135 11/06/2014 0957   NA 137 02/02/2014 1009   K 4.2 11/06/2014 0957   K 4.7 02/02/2014 1009   CL 103 11/06/2014 0957   CL 104 01/06/2012 1355   CO2 27 11/06/2014 0957   CO2 25 02/02/2014 1009   GLUCOSE 142* 11/06/2014 0957   GLUCOSE 127 02/02/2014 1009   GLUCOSE 77 01/06/2012 1355   BUN 13 11/06/2014 0957   BUN 13.1 02/02/2014 1009   CREATININE 1.36 11/06/2014 0957   CREATININE 1.4* 02/02/2014 1009   CREATININE 1.40* 11/04/2012 1622   CALCIUM 9.6 11/06/2014 0957   CALCIUM 10.0 02/02/2014 1009   GFRNONAA 74.56 02/03/2010 1110   GFRAA 57 02/08/2008 1040    Lipid Panel     Component Value Date/Time   CHOL 133 08/30/2014 1444   TRIG 367.0* 08/30/2014 1444    HDL 35.60* 08/30/2014 1444   CHOLHDL 4 08/30/2014 1444   VLDL 73.4* 08/30/2014 1444   LDLCALC 57 06/14/2013 1235    CBC    Component Value Date/Time   WBC 5.3 11/06/2014 0957   WBC 4.6 02/02/2014 1009   RBC 4.74 11/06/2014 0957   RBC 4.93 02/02/2014 1009   HGB 14.6 11/06/2014 0957   HGB 14.5 02/02/2014 1009   HCT 43.3 11/06/2014 0957  HCT 44.7 02/02/2014 1009   PLT 171.0 11/06/2014 0957   PLT 189 02/02/2014 1009   MCV 91.4 11/06/2014 0957   MCV 90.6 02/02/2014 1009   MCH 29.5 02/02/2014 1009   MCH 31.2 11/04/2012 1622   MCHC 33.6 11/06/2014 0957   MCHC 32.6 02/02/2014 1009   RDW 12.9 11/06/2014 0957   RDW 13.2 02/02/2014 1009   LYMPHSABS 1.4 02/02/2014 1009   LYMPHSABS 1.2 11/04/2012 1622   MONOABS 0.3 02/02/2014 1009   MONOABS 0.4 11/04/2012 1622   EOSABS 0.1 02/02/2014 1009   EOSABS 0.1 11/04/2012 1622   BASOSABS 0.1 02/02/2014 1009   BASOSABS 0.0 11/04/2012 1622    Hgb A1C Lab Results  Component Value Date   HGBA1C 6.6* 08/30/2014         Assessment & Plan:   Blister with cellulitis of left 5th toe:  Elevate leg to reduce swelling Ok to continue epsom salt soaks eRx for Doxycycline 100 mg BID x 10 days Return precautions given  RTC as needed or if symptoms persist or worsen

## 2014-12-27 NOTE — Progress Notes (Signed)
Subjective:    Patient ID: Brad Brown, male    DOB: 03-27-1945, 69 y.o.   MRN: [id number]  HPI  Brad Brown is a 69 year old male who presents today with chief complaint of left 5th toe swelling and erythema.  This began 2 days ago after he was walking his dog and twisted his ankle.  He wore a brace on his ankle for 2 days and the ankle pain resolved.  He cannot put weight on his feet without pain in his toe.  No joint swelling, decreased range of motion.   Review of Systems  Constitutional: Negative for fever, chills and fatigue.  HENT: Negative.   Respiratory: Negative for cough, shortness of breath and wheezing.   Cardiovascular: Negative for chest pain, palpitations and leg swelling.  Gastrointestinal: Negative for abdominal pain, diarrhea and constipation.  Genitourinary: Negative for dysuria, urgency and flank pain.  Musculoskeletal: Negative for myalgias, joint swelling and gait problem.       Left 5th toe is painful with walking and with weight bearing.   Skin: Positive for color change (On left 5th toe- redness).   Family History  Problem Relation Age of Onset  . Heart attack Brother   . Colon cancer Brother   . Heart attack Mother    Current Outpatient Prescriptions on File Prior to Visit  Medication Sig Dispense Refill  . budesonide-formoterol (SYMBICORT) 160-4.5 MCG/ACT inhaler Inhale 2 puffs into the lungs 2 (two) times daily. 1 Inhaler 6  . clotrimazole-betamethasone (LOTRISONE) cream Apply to groin rash 2 times daily 30 g 1  . fluticasone (FLOVENT HFA) 110 MCG/ACT inhaler Inhale 1 puff into the lungs 2 (two) times daily. 1 Inhaler 12  . glipiZIDE (GLUCOTROL) 5 MG tablet TAKE 1 TABLET TWICE A DAY BEFORE A MEAL 180 tablet 1  . levETIRAcetam (KEPPRA) 500 MG tablet Take one tablet by mouth in the morning and two tablets by mouth at night    . losartan (COZAAR) 25 MG tablet TAKE 1 TABLET (25 MG TOTAL) BY MOUTH DAILY. 90 tablet 3  . meloxicam (MOBIC) 7.5 MG tablet  TAKE 1 TABLET (7.5 MG TOTAL) BY MOUTH DAILY. 90 tablet 1  . methylPREDNISolone (MEDROL) 8 MG tablet Take as directed 16 tablet 0  . metoprolol tartrate (LOPRESSOR) 25 MG tablet Take 12.5 mg by mouth 2 (two) times daily.    . Multiple Vitamin (MULTIVITAMIN) tablet Take 1 tablet by mouth daily.      . naftifine (NAFTIN) 1 % cream Apply 1 application topically 2 (two) times daily as needed (For  infection between toes).     . Niacin, Antihyperlipidemic, 500 MG TABS Take 500 mg by mouth daily. 30 tablet 2  . Omega-3 Fatty Acids (FISH OIL) 1200 MG CAPS Take 1 capsule by mouth daily.    . Rivaroxaban (XARELTO) 20 MG TABS tablet Take 20 mg by mouth daily with supper.    . simvastatin (ZOCOR) 20 MG tablet Take one half tablet by mouth daily at bedtime      No current facility-administered medications on file prior to visit.        Objective:   Physical Exam  Constitutional: He is oriented to person, place, and time. He appears well-developed and well-nourished. No distress.  HENT:  Head: Normocephalic and atraumatic.  Mouth/Throat: No oropharyngeal exudate.  Neck: Normal range of motion. Neck supple.  Cardiovascular: Normal rate, regular rhythm and normal heart sounds.   No murmur heard. Pulmonary/Chest: Effort normal and breath sounds  normal.  Abdominal: Soft. Bowel sounds are normal.  Musculoskeletal: Normal range of motion.  Left fifth toe is erythematous, with tracking up towards ankle.  Between 4th and 5th toe there is an area of broken skin.    Lymphadenopathy:    He has no cervical adenopathy.  Neurological: He is alert and oriented to person, place, and time.  Skin: Skin is warm and dry. He is not diaphoretic.          Assessment & Plan:  1. Cellulitis Doxycycline 100mg  po bid for 7 days.  Follow up with office in one week if symptoms not improving.

## 2015-01-03 ENCOUNTER — Encounter: Payer: Self-pay | Admitting: Internal Medicine

## 2015-01-03 ENCOUNTER — Telehealth: Payer: Self-pay | Admitting: *Deleted

## 2015-01-03 ENCOUNTER — Telehealth: Payer: Self-pay | Admitting: Oncology

## 2015-01-03 ENCOUNTER — Ambulatory Visit (INDEPENDENT_AMBULATORY_CARE_PROVIDER_SITE_OTHER): Payer: Medicare Other | Admitting: Internal Medicine

## 2015-01-03 VITALS — BP 140/88 | HR 70 | Temp 97.4°F | Wt 199.0 lb

## 2015-01-03 DIAGNOSIS — B353 Tinea pedis: Secondary | ICD-10-CM

## 2015-01-03 MED ORDER — CICLOPIROX OLAMINE 0.77 % EX CREA: TOPICAL_CREAM | Freq: Two times a day (BID) | CUTANEOUS | Status: DC

## 2015-01-03 NOTE — Patient Instructions (Signed)

## 2015-01-03 NOTE — Progress Notes (Signed)
Subjective:    Patient ID: Brad Brown, male    DOB: [birth year], 69 y.o.   MRN: [id number]  HPI  Pt presents to the clinic today to follow up pain and swelling of his left 5th toe. He was seen 10/6 for the same and put on oral antibiotics for a possible cellulitis. He has not noticed any improvement, despite taking the antibiotics as prescribed. The area is red and tender to touch. It is itchy. The blister has opened up. He does have DM 2 but denies decreased sensation in his feet. He does not do daily foot exams. He has never had gout that he is aware of.  Review of Systems      Past Medical History  Diagnosis Date  . COPD (chronic obstructive pulmonary disease) ([place])   . CAD (coronary artery disease)   . 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 Automatic implantable cardiac defibrillator in situ   . Hyperlipidemia   . Borderline diabetes mellitus   . GERD (gastroesophageal reflux disease)   . Colonic polyp 2004    hyperplastic   . History of renal insufficiency syndrome   . Stroke ([place])   . Seizure disorder ([place])   . Monoclonal gammopathy   . Arthritis   . Atrial fibrillation ([place])   . HTN (hypertension)     Current Outpatient Prescriptions  Medication Sig Dispense Refill  . budesonide-formoterol (SYMBICORT) 160-4.5 MCG/ACT inhaler Inhale 2 puffs into the lungs 2 (two) times daily. 1 Inhaler 6  . clotrimazole-betamethasone (LOTRISONE) cream Apply to groin rash 2 times daily 30 g 1  . doxycycline (VIBRA-TABS) 100 MG tablet Take 1 tablet (100 mg total) by mouth 2 (two) times daily. 20 tablet 0  . fluticasone (FLOVENT HFA) 110 MCG/ACT inhaler Inhale 1 puff into the lungs 2 (two) times daily. 1 Inhaler 12  . glipiZIDE (GLUCOTROL) 5 MG tablet TAKE 1 TABLET TWICE A DAY BEFORE A MEAL 180 tablet 1  . levETIRAcetam (KEPPRA) 500 MG tablet Take one tablet by mouth in the morning and two tablets by mouth at night    . losartan (COZAAR) 25 MG tablet TAKE 1 TABLET  (25 MG TOTAL) BY MOUTH DAILY. 90 tablet 3  . meloxicam (MOBIC) 7.5 MG tablet TAKE 1 TABLET (7.5 MG TOTAL) BY MOUTH DAILY. 90 tablet 1  . methylPREDNISolone (MEDROL) 8 MG tablet Take as directed 16 tablet 0  . metoprolol tartrate (LOPRESSOR) 25 MG tablet Take 12.5 mg by mouth 2 (two) times daily.    . Multiple Vitamin (MULTIVITAMIN) tablet Take 1 tablet by mouth daily.      . naftifine (NAFTIN) 1 % cream Apply 1 application topically 2 (two) times daily as needed (For  infection between toes).     . Niacin, Antihyperlipidemic, 500 MG TABS Take 500 mg by mouth daily. 30 tablet 2  . Omega-3 Fatty Acids (FISH OIL) 1200 MG CAPS Take 1 capsule by mouth daily.    . Rivaroxaban (XARELTO) 20 MG TABS tablet Take 20 mg by mouth daily with supper.    . simvastatin (ZOCOR) 20 MG tablet Take one half tablet by mouth daily at bedtime     . ciclopirox (LOPROX) 0.77 % cream Apply topically 2 (two) times daily. 15 g 0   No current facility-administered medications for this visit.    Allergies  Allergen Reactions  . Lipitor [Atorvastatin]     REACTION: pt states rash from Lipitor    Family History  Problem Relation Age  of Onset  . Heart attack Brother   . Colon cancer Brother   . Heart attack Mother     Social History   Social History  . Marital Status: Single    Spouse Name: N/A  . Number of Children: 1  . Years of Education: N/A   Occupational History  . retired    Social History Main Topics  . Smoking status: Former Smoker -- 0.50 packs/day for 15 years    Types: Cigarettes    Quit date: 03/23/2004  . Smokeless tobacco: Never Used  . Alcohol Use: 0.6 oz/week    1 Glasses of wine per week     Comment: rare--wine  . Drug Use: No  . Sexual Activity: Not on file   Other Topics Concern  . Not on file   Social History Narrative   ICD-St. Jude  Remote-Yes     Constitutional: Denies fever, malaise, fatigue, headache or abrupt weight changes.  Respiratory: Denies difficulty  breathing, shortness of breath, cough or sputum production.   Cardiovascular: Denies chest pain, chest tightness, palpitations or swelling in the hands or feet.  Musculoskeletal: Pt reports left 5th toe pain. Denies decrease in range of motion, muscle pain or joint swelling.  Skin: Pt reports redness of left foot. Denies rashes, lesions or ulcercations.    No other specific complaints in a complete review of systems (except as listed in HPI above).  Objective:   Physical Exam  BP 140/88 mmHg  Pulse 70  Temp(Src) 97.4 F (36.3 C) (Oral)  Wt 199 lb (90.266 kg)  SpO2 98% Wt Readings from Last 3 Encounters:  01/03/15 199 lb (90.266 kg)  12/27/14 201 lb (91.173 kg)  12/07/14 203 lb 3.2 oz (92.171 kg)   General: Appears his stated age, well developed, well nourished in NAD. Skin: Redness noted of 5th toe, extending up dorsal surface of midfoot. Not warm to touch. Fungul infection noted underneath left 5th toe. Cardiovascular: Normal rate and rhythm. S1,S2 noted.   Pulmonary/Chest: Normal effort and positive vesicular breath sounds. No respiratory distress. No wheezes, rales or ronchi noted.  Musculoskeletal: Normal flexion, extension of the left 5th toe. Nontender to palpation. Normal gait. Neurological: Alert and oriented. Sensation intact to BLE.  BMET    Component Value Date/Time   NA 135 11/06/2014 0957   NA 137 02/02/2014 1009   K 4.2 11/06/2014 0957   K 4.7 02/02/2014 1009   CL 103 11/06/2014 0957   CL 104 01/06/2012 1355   CO2 27 11/06/2014 0957   CO2 25 02/02/2014 1009   GLUCOSE 142* 11/06/2014 0957   GLUCOSE 127 02/02/2014 1009   GLUCOSE 77 01/06/2012 1355   BUN 13 11/06/2014 0957   BUN 13.1 02/02/2014 1009   CREATININE 1.36 11/06/2014 0957   CREATININE 1.4* 02/02/2014 1009   CREATININE 1.40* 11/04/2012 1622   CALCIUM 9.6 11/06/2014 0957   CALCIUM 10.0 02/02/2014 1009   GFRNONAA 74.56 02/03/2010 1110   GFRAA 57 02/08/2008 1040    Lipid Panel     Component  Value Date/Time   CHOL 133 08/30/2014 1444   TRIG 367.0* 08/30/2014 1444   HDL 35.60* 08/30/2014 1444   CHOLHDL 4 08/30/2014 1444   VLDL 73.4* 08/30/2014 1444   LDLCALC 57 06/14/2013 1235    CBC    Component Value Date/Time   WBC 5.3 11/06/2014 0957   WBC 4.6 02/02/2014 1009   RBC 4.74 11/06/2014 0957   RBC 4.93 02/02/2014 1009   HGB 14.6 11/06/2014 0957  HGB 14.5 02/02/2014 1009   HCT 43.3 11/06/2014 0957   HCT 44.7 02/02/2014 1009   PLT 171.0 11/06/2014 0957   PLT 189 02/02/2014 1009   MCV 91.4 11/06/2014 0957   MCV 90.6 02/02/2014 1009   MCH 29.5 02/02/2014 1009   MCH 31.2 11/04/2012 1622   MCHC 33.6 11/06/2014 0957   MCHC 32.6 02/02/2014 1009   RDW 12.9 11/06/2014 0957   RDW 13.2 02/02/2014 1009   LYMPHSABS 1.4 02/02/2014 1009   LYMPHSABS 1.2 11/04/2012 1622   MONOABS 0.3 02/02/2014 1009   MONOABS 0.4 11/04/2012 1622   EOSABS 0.1 02/02/2014 1009   EOSABS 0.1 11/04/2012 1622   BASOSABS 0.1 02/02/2014 1009   BASOSABS 0.0 11/04/2012 1622    Hgb A1C Lab Results  Component Value Date   HGBA1C 6.6* 08/30/2014         Assessment & Plan:   Tinea pedis, left foot:  Elevate leg to reduce swelling Ok to continue epsom salt soaks eRx for Ciclopirox BID until resolved Return precautions given  RTC as needed or if symptoms persist or worsen

## 2015-01-03 NOTE — Telephone Encounter (Signed)
Loprox 0.77 cream was prescribed for a 15 gram tube.  [facility] only has 30 grams available.  Okay to change to 30 grams?

## 2015-01-03 NOTE — Telephone Encounter (Signed)
PAL - moved 11/15 f/u to 11/23. Lab for 11/8 remains the same. Spoke with patient he is aware.

## 2015-01-03 NOTE — Progress Notes (Signed)
Pre visit review using our clinic review tool, if applicable. No additional management support is needed unless otherwise documented below in the visit note. 

## 2015-01-04 NOTE — Telephone Encounter (Signed)
Spoke to [place] with [facility] and gave verbal okay for 30 gm tube

## 2015-01-04 NOTE — Telephone Encounter (Signed)
Ok to change to 30 gm tube

## 2015-01-22 ENCOUNTER — Ambulatory Visit (INDEPENDENT_AMBULATORY_CARE_PROVIDER_SITE_OTHER): Payer: Medicare Other | Admitting: *Deleted

## 2015-01-22 DIAGNOSIS — Z95810 Presence of automatic (implantable) cardiac defibrillator: Secondary | ICD-10-CM

## 2015-01-22 DIAGNOSIS — I498 Other specified cardiac arrhythmias: Secondary | ICD-10-CM

## 2015-01-22 DIAGNOSIS — Q248 Other specified congenital malformations of heart: Secondary | ICD-10-CM | POA: Diagnosis not present

## 2015-01-24 NOTE — Progress Notes (Signed)
Remote ICD transmission.   

## 2015-01-25 ENCOUNTER — Other Ambulatory Visit: Payer: Self-pay | Admitting: Internal Medicine

## 2015-01-28 ENCOUNTER — Ambulatory Visit (HOSPITAL_BASED_OUTPATIENT_CLINIC_OR_DEPARTMENT_OTHER): Payer: Medicare Other

## 2015-01-28 ENCOUNTER — Telehealth: Payer: Self-pay | Admitting: Oncology

## 2015-01-28 DIAGNOSIS — D472 Monoclonal gammopathy: Secondary | ICD-10-CM | POA: Diagnosis not present

## 2015-01-28 LAB — CBC WITH DIFFERENTIAL/PLATELET
BASO%: 0.4 % (ref 0.0–2.0)
BASOS ABS: 0 10*3/uL (ref 0.0–0.1)
EOS ABS: 0.1 10*3/uL (ref 0.0–0.5)
EOS%: 2.6 % (ref 0.0–7.0)
HEMATOCRIT: 40.2 % (ref 38.4–49.9)
HEMOGLOBIN: 13.7 g/dL (ref 13.0–17.1)
LYMPH#: 1.2 10*3/uL (ref 0.9–3.3)
LYMPH%: 23.4 % (ref 14.0–49.0)
MCH: 30.9 pg (ref 27.2–33.4)
MCHC: 34.1 g/dL (ref 32.0–36.0)
MCV: 90.7 fL (ref 79.3–98.0)
MONO#: 0.3 10*3/uL (ref 0.1–0.9)
MONO%: 5.3 % (ref 0.0–14.0)
NEUT%: 68.3 % (ref 39.0–75.0)
NEUTROS ABS: 3.6 10*3/uL (ref 1.5–6.5)
Platelets: 170 10*3/uL (ref 140–400)
RBC: 4.43 10*6/uL (ref 4.20–5.82)
RDW: 13 % (ref 11.0–14.6)
WBC: 5.3 10*3/uL (ref 4.0–10.3)

## 2015-01-28 LAB — COMPREHENSIVE METABOLIC PANEL (CC13)
ALBUMIN: 3.8 g/dL (ref 3.5–5.0)
ALK PHOS: 57 U/L (ref 40–150)
ALT: 28 U/L (ref 0–55)
AST: 17 U/L (ref 5–34)
Anion Gap: 8 mEq/L (ref 3–11)
BILIRUBIN TOTAL: 1.12 mg/dL (ref 0.20–1.20)
BUN: 14.2 mg/dL (ref 7.0–26.0)
CALCIUM: 9.5 mg/dL (ref 8.4–10.4)
CO2: 23 mEq/L (ref 22–29)
Chloride: 108 mEq/L (ref 98–109)
Creatinine: 1.5 mg/dL — ABNORMAL HIGH (ref 0.7–1.3)
EGFR: 55 mL/min/{1.73_m2} — ABNORMAL LOW (ref >90)
GLUCOSE: 123 mg/dL (ref 70–140)
POTASSIUM: 4.2 meq/L (ref 3.5–5.1)
SODIUM: 139 meq/L (ref 136–145)
TOTAL PROTEIN: 7 g/dL (ref 6.4–8.3)

## 2015-01-28 NOTE — Telephone Encounter (Signed)
Patient called in to move labs to this afternoon as he has a conflict

## 2015-01-29 ENCOUNTER — Other Ambulatory Visit: Payer: Medicare Other

## 2015-01-30 LAB — SPEP & IFE WITH QIG
ABNORMAL PROTEIN BAND1: 0.8 g/dL
ALBUMIN ELP: 4.3 g/dL (ref 3.8–4.8)
ALPHA-1-GLOBULIN: 0.3 g/dL (ref 0.2–0.3)
ALPHA-2-GLOBULIN: 0.6 g/dL (ref 0.5–0.9)
Beta 2: 0.3 g/dL (ref 0.2–0.5)
Beta Globulin: 0.4 g/dL (ref 0.4–0.6)
GAMMA GLOBULIN: 1.3 g/dL (ref 0.8–1.7)
IgA: 46 mg/dL — ABNORMAL LOW (ref 68–379)
IgG (Immunoglobin G), Serum: 1460 mg/dL (ref 650–1600)
IgM, Serum: 21 mg/dL — ABNORMAL LOW (ref 41–251)
Total Protein, Serum Electrophoresis: 7 g/dL (ref 6.1–8.1)

## 2015-01-30 LAB — KAPPA/LAMBDA LIGHT CHAINS
KAPPA FREE LGHT CHN: 8.31 mg/dL — AB (ref 0.33–1.94)
Kappa:Lambda Ratio: 18.07 — ABNORMAL HIGH (ref 0.26–1.65)
LAMBDA FREE LGHT CHN: 0.46 mg/dL — AB (ref 0.57–2.63)

## 2015-02-04 DIAGNOSIS — N5201 Erectile dysfunction due to arterial insufficiency: Secondary | ICD-10-CM | POA: Diagnosis not present

## 2015-02-04 DIAGNOSIS — R350 Frequency of micturition: Secondary | ICD-10-CM | POA: Diagnosis not present

## 2015-02-04 DIAGNOSIS — N182 Chronic kidney disease, stage 2 (mild): Secondary | ICD-10-CM | POA: Diagnosis not present

## 2015-02-05 ENCOUNTER — Ambulatory Visit: Payer: Medicare Other | Admitting: Oncology

## 2015-02-07 LAB — CUP PACEART REMOTE DEVICE CHECK
Brady Statistic RV Percent Paced: 1 %
Date Time Interrogation Session: 20161101123946
HIGH POWER IMPEDANCE MEASURED VALUE: 48 Ohm
Lead Channel Impedance Value: 440 Ohm
Lead Channel Pacing Threshold Amplitude: 1 V
MDC IDC LEAD IMPLANT DT: 20080527
MDC IDC LEAD LOCATION: 753860
MDC IDC LEAD MODEL: 7121
MDC IDC MSMT BATTERY REMAINING LONGEVITY: 18 mo
MDC IDC MSMT BATTERY VOLTAGE: 2.54 V
MDC IDC MSMT LEADCHNL RV PACING THRESHOLD PULSEWIDTH: 0.5 ms
MDC IDC MSMT LEADCHNL RV SENSING INTR AMPL: 8.9 mV
MDC IDC SET LEADCHNL RV PACING AMPLITUDE: 2.5 V
MDC IDC SET LEADCHNL RV PACING PULSEWIDTH: 0.5 ms
MDC IDC SET LEADCHNL RV SENSING SENSITIVITY: 0.3 mV
Pulse Gen Serial Number: 449377

## 2015-02-08 ENCOUNTER — Encounter: Payer: Self-pay | Admitting: Cardiology

## 2015-02-13 ENCOUNTER — Ambulatory Visit (HOSPITAL_BASED_OUTPATIENT_CLINIC_OR_DEPARTMENT_OTHER): Payer: Medicare Other | Admitting: Oncology

## 2015-02-13 ENCOUNTER — Telehealth: Payer: Self-pay | Admitting: Oncology

## 2015-02-13 VITALS — BP 158/83 | HR 63 | Temp 97.5°F | Resp 20 | Ht 68.5 in | Wt 208.5 lb

## 2015-02-13 DIAGNOSIS — G40909 Epilepsy, unspecified, not intractable, without status epilepticus: Secondary | ICD-10-CM

## 2015-02-13 DIAGNOSIS — D472 Monoclonal gammopathy: Secondary | ICD-10-CM

## 2015-02-13 DIAGNOSIS — N289 Disorder of kidney and ureter, unspecified: Secondary | ICD-10-CM | POA: Diagnosis not present

## 2015-02-13 NOTE — Progress Notes (Signed)
Hematology and Oncology Follow Up Visit  Brad Brown [id number] 1945/05/24 69 y.o. 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 without any evidence of end-organ damage. He does have mild chronic renal insufficiency which is unchanged over the last 8 years.  Current therapy: He is under observation and surveillance.  His M-spike continued to be less than 1 g/dL.  Interim History:  Brad Brown presents today for a followup visit by himself. Since the last visit, he reports no complaints. He continues to be active without any decline in his health or energy. He denied any pathological fractures or recurrent infections. His cardiac and kidney status have been stable without any recent illnesses or hospitalizations.   He does not report any headaches or blurry vision or syncope. He does not report any fevers, chills, sweats or weight loss. He does not report any chest pain, palpitation or orthopnea. He does not report leg edema cough or shortness of breath. He does not report any nausea, vomiting or difficulty eating. He does not report any lymphadenopathy or petechiae. He does not report any urinary symptoms such as dysuria or hematuria and the rest of his review of systems unremarkable.  Medications: I have reviewed the patient's current medications.  Current Outpatient Prescriptions  Medication Sig Dispense Refill  . budesonide-formoterol (SYMBICORT) 160-4.5 MCG/ACT inhaler Inhale 2 puffs into the lungs 2 (two) times daily. 1 Inhaler 6  . ciclopirox (LOPROX) 0.77 % cream Apply topically 2 (two) times daily. 15 g 0  . clotrimazole-betamethasone (LOTRISONE) cream Apply to groin rash 2 times daily 30 g 1  . doxycycline (VIBRA-TABS) 100 MG tablet Take 1 tablet (100 mg total) by mouth 2 (two) times daily. 20 tablet 0  . fluticasone (FLOVENT HFA) 110 MCG/ACT  inhaler Inhale 1 puff into the lungs 2 (two) times daily. 1 Inhaler 12  . glipiZIDE (GLUCOTROL) 5 MG tablet TAKE 1 TABLET TWICE A DAY BEFORE A MEAL 180 tablet 1  . levETIRAcetam (KEPPRA) 500 MG tablet Take one tablet by mouth in the morning and two tablets by mouth at night    . losartan (COZAAR) 25 MG tablet TAKE 1 TABLET (25 MG TOTAL) BY MOUTH DAILY. 90 tablet 3  . meloxicam (MOBIC) 7.5 MG tablet TAKE 1 TABLET (7.5 MG TOTAL) BY MOUTH DAILY. 90 tablet 1  . methylPREDNISolone (MEDROL) 8 MG tablet Take as directed 16 tablet 0  . metoprolol tartrate (LOPRESSOR) 25 MG tablet Take 12.5 mg by mouth 2 (two) times daily.    . Multiple Vitamin (MULTIVITAMIN) tablet Take 1 tablet by mouth daily.      . naftifine (NAFTIN) 1 % cream Apply 1 application topically 2 (two) times daily as needed (For  infection between toes).     . Niacin, Antihyperlipidemic, 500 MG TABS Take 500 mg by mouth daily. 30 tablet 2  . Omega-3 Fatty Acids (FISH OIL) 1200 MG CAPS Take 1 capsule by mouth daily.    . Rivaroxaban (XARELTO) 20 MG TABS tablet Take 20 mg by mouth daily with supper.    . simvastatin (ZOCOR) 20 MG tablet Take one half tablet by mouth daily at bedtime     . tamsulosin (FLOMAX) 0.4 MG CAPS capsule Take 0.4 mg by mouth daily.  5   No current facility-administered medications for this visit.    Allergies:  Allergies  Allergen Reactions  . Lipitor [Atorvastatin]  REACTION: pt states rash from Lipitor    Past Medical History, Surgical history, Social history, and Family History were reviewed and updated.  Physical Exam: Blood pressure 158/83, pulse 63, temperature 97.5 F (36.4 C), temperature source Oral, resp. rate 20, height 5' 8.5" (1.74 m), weight 208 lb 8 oz (94.575 kg), SpO2 100 %. ECOG: 0 General appearance: alert awake gentleman appeared in no active distress. Head: Normocephalic, without obvious abnormality no oral ulcers or lesions. Neck: no adenopathy Lymph nodes: Cervical,  supraclavicular, and axillary nodes normal. Heart:regular rate and rhythm, S1, S2 normal, no murmur, click, rub or gallop Lung:chest clear, no wheezing, rales, normal symmetric air entry Abdomin: soft, non-tender, without masses or organomegaly no shifting dullness or ascites. EXT:no erythema, induration, or nodules   Lab Results: Lab Results  Component Value Date   WBC 5.3 01/28/2015   HGB 13.7 01/28/2015   HCT 40.2 01/28/2015   MCV 90.7 01/28/2015   PLT 170 01/28/2015     Chemistry      Component Value Date/Time   NA 139 01/28/2015 1238   NA 135 11/06/2014 0957   K 4.2 01/28/2015 1238   K 4.2 11/06/2014 0957   CL 103 11/06/2014 0957   CL 104 01/06/2012 1355   CO2 23 01/28/2015 1238   CO2 27 11/06/2014 0957   BUN 14.2 01/28/2015 1238   BUN 13 11/06/2014 0957   CREATININE 1.5* 01/28/2015 1238   CREATININE 1.36 11/06/2014 0957   CREATININE 1.40* 11/04/2012 1622      Component Value Date/Time   CALCIUM 9.5 01/28/2015 1238   CALCIUM 9.6 11/06/2014 0957   ALKPHOS 57 01/28/2015 1238   ALKPHOS 57 08/30/2014 1444   AST 17 01/28/2015 1238   AST 20 08/30/2014 1444   ALT 28 01/28/2015 1238   ALT 23 08/30/2014 1444   BILITOT 1.12 01/28/2015 1238   BILITOT 1.0 08/30/2014 1444       Results for Brad Brown (MRN [id number]) as of 02/13/2015 12:34  Ref. Range 01/26/2013 08:50 02/02/2014 10:09 01/28/2015 12:38  IgG (Immunoglobin G), Serum Latest Ref Range: [phone] mg/dL 1620 (H) 1640 (H) 1460  IgA Latest Ref Range: 68-379 mg/dL 48 (L) 58 (L) 46 (L)  IgM, Serum Latest Ref Range: 41-251 mg/dL 13 (L) 23 (L) 21 (L)  Total Protein, Serum Electrophoresis Latest Ref Range: 6.1-8.1 g/dL 7.1 7.8 7.0  Kappa free light chain Latest Ref Range: 0.33-1.94 mg/dL 9.07 (H) 7.98 (H) 8.31 (H)  Lambda Free Lght Chn Latest Ref Range: 0.57-2.63 mg/dL 0.99 0.62 0.46 (L)  Kappa:Lambda Ratio Latest Ref Range: 0.26-1.65  9.16 (H) 12.87 (H) 18.07 (H)       Impression and Plan:  A  69 year old gentleman with the following issues:  1. IgG kappa monoclonal gammopathy of undetermined significance without any evidence of end-organ damage to suggest multiple myeloma. He has an elevated Kappa light chain which have not changed dramatically over the years. His M spike and November 2016 continues to be less than 1 g/dL without any evidence of end organ damage. The plan is to continue with active surveillance for the time being and monitor these laboratory testing annually. Restaging with a bone marrow biopsy and a skeletal survey will be done if there is increase in his protein studies or evidence of end organ damage. 2. Renal insufficiency: His creatinine have been stable for the last 8 years ranging between 1.3-1.5. 3. Seizure disorder.  No seizure activity recorded as of late. 4.  Brugada syndrome:  followed by  cardiology. 5. Follow-up: Will be in one year for laboratory testing and repeat physical examination.      [facility] ([facility] MD 11/23/201612:32 PM

## 2015-02-13 NOTE — Telephone Encounter (Signed)
per pof to sch pt appt-gave pt copy of avs °

## 2015-02-28 ENCOUNTER — Encounter: Payer: Self-pay | Admitting: Internal Medicine

## 2015-02-28 ENCOUNTER — Ambulatory Visit (INDEPENDENT_AMBULATORY_CARE_PROVIDER_SITE_OTHER): Payer: Medicare Other | Admitting: Internal Medicine

## 2015-02-28 VITALS — BP 136/94 | HR 59 | Temp 97.5°F | Wt 205.0 lb

## 2015-02-28 DIAGNOSIS — E119 Type 2 diabetes mellitus without complications: Secondary | ICD-10-CM

## 2015-02-28 DIAGNOSIS — I10 Essential (primary) hypertension: Secondary | ICD-10-CM

## 2015-02-28 DIAGNOSIS — I2510 Atherosclerotic heart disease of native coronary artery without angina pectoris: Secondary | ICD-10-CM | POA: Diagnosis not present

## 2015-02-28 DIAGNOSIS — I482 Chronic atrial fibrillation, unspecified: Secondary | ICD-10-CM

## 2015-02-28 DIAGNOSIS — E785 Hyperlipidemia, unspecified: Secondary | ICD-10-CM

## 2015-02-28 DIAGNOSIS — K219 Gastro-esophageal reflux disease without esophagitis: Secondary | ICD-10-CM

## 2015-02-28 DIAGNOSIS — R569 Unspecified convulsions: Secondary | ICD-10-CM

## 2015-02-28 DIAGNOSIS — J411 Mucopurulent chronic bronchitis: Secondary | ICD-10-CM

## 2015-02-28 LAB — LIPID PANEL
CHOLESTEROL: 128 mg/dL (ref 0–200)
HDL: 37.7 mg/dL — ABNORMAL LOW (ref >39.00)
NonHDL: 90.35
Total CHOL/HDL Ratio: 3
Triglycerides: 220 mg/dL — ABNORMAL HIGH (ref 0.0–149.0)
VLDL: 44 mg/dL — ABNORMAL HIGH (ref 0.0–40.0)

## 2015-02-28 LAB — HEMOGLOBIN A1C: HEMOGLOBIN A1C: 6.9 % — AB (ref 4.6–6.5)

## 2015-02-28 LAB — LDL CHOLESTEROL, DIRECT: LDL DIRECT: 30 mg/dL

## 2015-02-28 NOTE — Assessment & Plan Note (Signed)
Will check Lipid Profile and CMET today Continue Zocor Encouraged him to consume a low fat diet

## 2015-02-28 NOTE — Patient Instructions (Signed)

## 2015-02-28 NOTE — Assessment & Plan Note (Signed)
Chronic Continue Metoprolol and Xarelto He will continue to follow with cardiology

## 2015-02-28 NOTE — Assessment & Plan Note (Signed)
No currently an issue Will continue to monitor

## 2015-02-28 NOTE — Assessment & Plan Note (Signed)
Continue Keppra.

## 2015-02-28 NOTE — Assessment & Plan Note (Signed)
Will check A1C today Flu and Pneumovax and Prvevnar UTD Continue Glipizide Foot exam today Encouraged him to continue yearly eye exams Reinforced low carb, low fat diet

## 2015-02-28 NOTE — Assessment & Plan Note (Signed)
Continue Symbicort and Flovent He will continue to follow with Dr. Lenna Gilford

## 2015-02-28 NOTE — Assessment & Plan Note (Signed)
LDL at goal on Zocor Continue Xarelto

## 2015-02-28 NOTE — Progress Notes (Signed)
Pre visit review using our clinic review tool, if applicable. No additional management support is needed unless otherwise documented below in the visit note. 

## 2015-02-28 NOTE — Assessment & Plan Note (Signed)
Slightly elevated today Continue Losartan

## 2015-02-28 NOTE — Progress Notes (Signed)
HPI:  Pt presents to the clinic today for 6 month follow up of chronic conditions.  COPD: CHe takes Symbicort and Flovent as prescribed. He no longer smokes. He does follow with pulmonology. Last note reviewed. Flu and Pneumovax UTD.  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 His triglycerides are 367.0.  He takes Simvistatin daily. He takes Fish Oil when he remembers. He tries to consume a low fat diet.  DM 2: He does not test his sugars. His last A1C  was 6.6%. He denies numbness or tingling in his hands or feet. He takes Glipizide twice daily as instructed. His last eye exam was 01/2015. Flu and Pneumovax are UTD.  GERD: Rare. He is not sure what triggers his reflux. He does not take anything OTC for this.  CRI: Last creatinine 1.36 (01/2015).  Seizure disorder: No seizures recently. He takes Keppra twice a day.  HTN: His BP today is 136/94. He is on Losartan 25 mg.  Afib: He is on Metoprolol and  Xarelto. He does follow with cardiology. He denies chest pain or shortness of breath   Past Medical History  Diagnosis Date  . COPD (chronic obstructive pulmonary disease) ([place])   . CAD (coronary artery disease)   . 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 Automatic implantable cardiac defibrillator in situ   . Hyperlipidemia   . Borderline diabetes mellitus   . GERD (gastroesophageal reflux disease)   . Colonic polyp 2004    hyperplastic   . History of renal insufficiency syndrome   . Stroke ([place])   . Seizure disorder ([place])   . Monoclonal gammopathy   . Arthritis   . Atrial fibrillation ([place])   . HTN (hypertension)     Current Outpatient Prescriptions  Medication Sig Dispense Refill  . budesonide-formoterol (SYMBICORT) 160-4.5 MCG/ACT inhaler Inhale 2 puffs into the lungs 2 (two) times daily. 1 Inhaler 6  . ciclopirox (LOPROX) 0.77 % cream Apply topically 2 (two) times daily. 15 g 0  . clotrimazole-betamethasone (LOTRISONE) cream Apply to groin rash 2 times daily 30  g 1  . fluticasone (FLOVENT HFA) 110 MCG/ACT inhaler Inhale 1 puff into the lungs 2 (two) times daily. 1 Inhaler 12  . glipiZIDE (GLUCOTROL) 5 MG tablet TAKE 1 TABLET TWICE A DAY BEFORE A MEAL 180 tablet 1  . levETIRAcetam (KEPPRA) 500 MG tablet Take one tablet by mouth in the morning and two tablets by mouth at night    . losartan (COZAAR) 25 MG tablet TAKE 1 TABLET (25 MG TOTAL) BY MOUTH DAILY. 90 tablet 3  . meloxicam (MOBIC) 7.5 MG tablet TAKE 1 TABLET (7.5 MG TOTAL) BY MOUTH DAILY. 90 tablet 1  . methylPREDNISolone (MEDROL) 8 MG tablet Take as directed 16 tablet 0  . metoprolol tartrate (LOPRESSOR) 25 MG tablet Take 12.5 mg by mouth 2 (two) times daily.    . Multiple Vitamin (MULTIVITAMIN) tablet Take 1 tablet by mouth daily.      . naftifine (NAFTIN) 1 % cream Apply 1 application topically 2 (two) times daily as needed (For  infection between toes).     . Niacin, Antihyperlipidemic, 500 MG TABS Take 500 mg by mouth daily. 30 tablet 2  . Omega-3 Fatty Acids (FISH OIL) 1200 MG CAPS Take 1 capsule by mouth daily.    . Rivaroxaban (XARELTO) 20 MG TABS tablet Take 20 mg by mouth daily with supper.    . simvastatin (ZOCOR) 20 MG tablet Take one half tablet by mouth  daily at bedtime     . tamsulosin (FLOMAX) 0.4 MG CAPS capsule Take 0.4 mg by mouth daily.  5   No current facility-administered medications for this visit.    Allergies  Allergen Reactions  . Lipitor [Atorvastatin]     REACTION: pt states rash from Lipitor    Family History  Problem Relation Age of Onset  . Heart attack Brother   . Colon cancer Brother   . Heart attack Mother     Social History   Social History  . Marital Status: Single    Spouse Name: N/A  . Number of Children: 1  . Years of Education: N/A   Occupational History  . retired    Social History Main Topics  . Smoking status: Former Smoker -- 0.50 packs/day for 15 years    Types: Cigarettes    Quit date: 03/23/2004  . Smokeless tobacco: Never  Used  . Alcohol Use: 0.6 oz/week    1 Glasses of wine per week     Comment: rare--wine  . Drug Use: No  . Sexual Activity: Not on file   Other Topics Concern  . Not on file   Social History Narrative   ICD-St. Jude  Remote-Yes    Subjective:   Review of Systems:   Constitutional: Denies fever, malaise, fatigue, headache or abrupt weight changes.  HEENT: Denies eye pain, eye redness, ear pain, ringing in the ears, wax buildup, runny nose, nasal congestion, bloody nose, or sore throat. Respiratory: Denies difficulty breathing, shortness of breath, cough or sputum production.   Cardiovascular: Denies chest pain, chest tightness, palpitations or swelling in the hands or feet.  Gastrointestinal: Denies abdominal pain, bloating, constipation, diarrhea or blood in the stool.  GU: Denies urgency, frequency, pain with urination, burning sensation, blood in urine, odor or discharge. Musculoskeletal: Denies decrease in range of motion, difficulty with gait, muscle pain or joint pain and swelling.  Skin: Denies redness, rashes, or ulcercations.  Neurological: Denies dizziness, difficulty with memory, difficulty with speech or problems with balance and coordination.  Psych: Pt denies anxiety, depression, SI/HI.  No other specific complaints in a complete review of systems (except as listed in HPI above).  Objective:  PE:   BP 136/94 mmHg  Pulse 59  Temp(Src) 97.5 F (36.4 C) (Oral)  Wt 205 lb (92.987 kg)  SpO2 98%   Wt Readings from Last 3 Encounters:  02/28/15 205 lb (92.987 kg)  02/13/15 208 lb 8 oz (94.575 kg)  01/03/15 199 lb (90.266 kg)    General: Appears his stated age, obese in NAD. Skin: Warm, dry and intact. No rashes, lesions or ulcerations noted. HEENT: Head: normal shape and size; Eyes: sclera white, no icterus, conjunctiva pink, PERRLA and EOMs intact; Ears: Tm's gray and intact, normal light reflex; Throat/Mouth: Teeth present, mucosa pink and moist, no exudate,  lesions or ulcerations noted.  Neck: Neck supple, trachea midline. No masses, lumps or thyromegaly present.  Cardiovascular: Normal rate with irregular rhythm. S1,S2 noted.  No murmur, rubs or gallops noted. No JVD or BLE edema. No carotid bruits noted. Pulmonary/Chest: Normal effort and positive vesicular breath sounds. No respiratory distress. No wheezes, rales or ronchi noted.  Abdomen: Soft and nontender. Normal bowel sounds. No distention or masses noted. Liver, spleen and kidneys non palpable. Neurological: Awake, confused at times. He has obvious trouble with recall. Psychiatric: Mood and affect normal. Behavior is normal. Judgment and thought content normal.    BMET    Component Value Date/Time  NA 139 01/28/2015 1238   NA 135 11/06/2014 0957   K 4.2 01/28/2015 1238   K 4.2 11/06/2014 0957   CL 103 11/06/2014 0957   CL 104 01/06/2012 1355   CO2 23 01/28/2015 1238   CO2 27 11/06/2014 0957   GLUCOSE 123 01/28/2015 1238   GLUCOSE 142* 11/06/2014 0957   GLUCOSE 77 01/06/2012 1355   BUN 14.2 01/28/2015 1238   BUN 13 11/06/2014 0957   CREATININE 1.5* 01/28/2015 1238   CREATININE 1.36 11/06/2014 0957   CREATININE 1.40* 11/04/2012 1622   CALCIUM 9.5 01/28/2015 1238   CALCIUM 9.6 11/06/2014 0957   GFRNONAA 74.56 02/03/2010 1110   GFRAA 57 02/08/2008 1040    Lipid Panel     Component Value Date/Time   CHOL 133 08/30/2014 1444   TRIG 367.0* 08/30/2014 1444   HDL 35.60* 08/30/2014 1444   CHOLHDL 4 08/30/2014 1444   VLDL 73.4* 08/30/2014 1444   LDLCALC 57 06/14/2013 1235    CBC    Component Value Date/Time   WBC 5.3 01/28/2015 1238   WBC 5.3 11/06/2014 0957   RBC 4.43 01/28/2015 1238   RBC 4.74 11/06/2014 0957   HGB 13.7 01/28/2015 1238   HGB 14.6 11/06/2014 0957   HCT 40.2 01/28/2015 1238   HCT 43.3 11/06/2014 0957   PLT 170 01/28/2015 1238   PLT 171.0 11/06/2014 0957   MCV 90.7 01/28/2015 1238   MCV 91.4 11/06/2014 0957   MCH 30.9 01/28/2015 1238   MCH 31.2  11/04/2012 1622   MCHC 34.1 01/28/2015 1238   MCHC 33.6 11/06/2014 0957   RDW 13.0 01/28/2015 1238   RDW 12.9 11/06/2014 0957   LYMPHSABS 1.2 01/28/2015 1238   LYMPHSABS 1.2 11/04/2012 1622   MONOABS 0.3 01/28/2015 1238   MONOABS 0.4 11/04/2012 1622   EOSABS 0.1 01/28/2015 1238   EOSABS 0.1 11/04/2012 1622   BASOSABS 0.0 01/28/2015 1238   BASOSABS 0.0 11/04/2012 1622    Hgb A1C Lab Results  Component Value Date   HGBA1C 6.6* 08/30/2014      Assessment and Plan:

## 2015-03-07 ENCOUNTER — Ambulatory Visit (INDEPENDENT_AMBULATORY_CARE_PROVIDER_SITE_OTHER): Payer: Medicare Other | Admitting: Internal Medicine

## 2015-03-07 ENCOUNTER — Encounter: Payer: Self-pay | Admitting: Internal Medicine

## 2015-03-07 VITALS — BP 148/92 | HR 56 | Temp 97.8°F | Wt 205.0 lb

## 2015-03-07 DIAGNOSIS — J069 Acute upper respiratory infection, unspecified: Secondary | ICD-10-CM

## 2015-03-07 DIAGNOSIS — B9789 Other viral agents as the cause of diseases classified elsewhere: Principal | ICD-10-CM

## 2015-03-07 MED ORDER — METHYLPREDNISOLONE ACETATE 80 MG/ML IJ SUSP
80.0000 mg | Freq: Once | INTRAMUSCULAR | Status: AC
Start: 2015-03-07 — End: 2015-03-07
  Administered 2015-03-07: 80 mg via INTRAMUSCULAR

## 2015-03-07 NOTE — Patient Instructions (Signed)

## 2015-03-07 NOTE — Addendum Note (Signed)
Addended by: Lurlean Nanny on: 03/07/2015 02:16 PM   Modules accepted: Orders

## 2015-03-07 NOTE — Progress Notes (Signed)
HPI  Pt presents to the clinic today with c/o runny nose, sore throat and cough. He reports this started 1 week ago. He is blowing yellow mucous out of his nose. The cough is productive of yellow mucous. He denies fever, chills or shortness of breath. He has tried Mucinex with some relief. He does have COPD and continues to take his inhalers. He has not had sick contacts that he is aware of.  Review of Systems      Past Medical History  Diagnosis Date  . COPD (chronic obstructive pulmonary disease) ([place])   . CAD (coronary artery disease)   . 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 Automatic implantable cardiac defibrillator in situ   . Hyperlipidemia   . Borderline diabetes mellitus   . GERD (gastroesophageal reflux disease)   . Colonic polyp 2004    hyperplastic   . History of renal insufficiency syndrome   . Stroke ([place])   . Seizure disorder ([place])   . Monoclonal gammopathy   . Arthritis   . Atrial fibrillation ([place])   . HTN (hypertension)     Family History  Problem Relation Age of Onset  . Heart attack Brother   . Colon cancer Brother   . Heart attack Mother     Social History   Social History  . Marital Status: Single    Spouse Name: N/A  . Number of Children: 1  . Years of Education: N/A   Occupational History  . retired    Social History Main Topics  . Smoking status: Former Smoker -- 0.50 packs/day for 15 years    Types: Cigarettes    Quit date: 03/23/2004  . Smokeless tobacco: Never Used  . Alcohol Use: 0.6 oz/week    1 Glasses of wine per week     Comment: rare--wine  . Drug Use: No  . Sexual Activity: Not on file   Other Topics Concern  . Not on file   Social History Narrative   ICD-St. Jude  Remote-Yes    Allergies  Allergen Reactions  . Lipitor [Atorvastatin]     REACTION: pt states rash from Lipitor     Constitutional:  Denies headache, fatigue, fever or abrupt weight changes.  HEENT:  Positive runny nose, sore  throat. Denies eye redness, eye pain, pressure behind the eyes, facial pain, nasal congestion, ear pain, ringing in the ears, wax buildup, or bloody nose. Respiratory: Positive cough. Denies difficulty breathing or shortness of breath.  Cardiovascular: Denies chest pain, chest tightness, palpitations or swelling in the hands or feet.   No other specific complaints in a complete review of systems (except as listed in HPI above).  Objective:   BP 148/92 mmHg  Pulse 56  Temp(Src) 97.8 F (36.6 C) (Oral)  Wt 205 lb (92.987 kg)  SpO2 98% Wt Readings from Last 3 Encounters:  03/07/15 205 lb (92.987 kg)  02/28/15 205 lb (92.987 kg)  02/13/15 208 lb 8 oz (94.575 kg)     General: Appears his stated age, in NAD. HEENT: Head: normal shape and size, no sinus tenderness ntoed; Eyes: sclera white, no icterus, conjunctiva pink; Ears: Tm's gray and intact, normal light reflex;  Throat/Mouth: + PND. Teeth present, mucosa pink and moist, no exudate noted, no lesions or ulcerations noted.  Neck: No cervical lymphadenopathy.  Cardiovascular: Normal rate and rhythm. S1,S2 noted.  No murmur, rubs or gallops noted.  Pulmonary/Chest: Normal effort and positive vesicular breath sounds, intermittent expiratory wheeze noted on the  right. No respiratory distress. No rales or ronchi noted.      Assessment & Plan:   Upper Respiratory Infection:  Viral Get some rest and drink plenty of water Do salt water gargles for the sore throat Continue inhalers 80 mg Depo IM today Start Allegra OTC daily x 2 weeks Delsym as needed for cough  RTC as needed or if symptoms persist.

## 2015-03-07 NOTE — Progress Notes (Signed)
Pre visit review using our clinic review tool, if applicable. No additional management support is needed unless otherwise documented below in the visit note. 

## 2015-03-12 ENCOUNTER — Telehealth: Payer: Self-pay | Admitting: Pulmonary Disease

## 2015-03-12 DIAGNOSIS — N5201 Erectile dysfunction due to arterial insufficiency: Secondary | ICD-10-CM | POA: Diagnosis not present

## 2015-03-12 NOTE — Telephone Encounter (Signed)
Received a handicap placard from pt. SN has signed the form. Called and spoke to pt and informed him the form is ready for him. Pt requested the placard be mailed to him, verified address. Placard placed in out going mail. Pt verbalized understanding and denied any further questions or concerns at this time.

## 2015-04-15 ENCOUNTER — Other Ambulatory Visit: Payer: Self-pay | Admitting: Internal Medicine

## 2015-04-23 ENCOUNTER — Telehealth: Payer: Self-pay | Admitting: Internal Medicine

## 2015-04-23 ENCOUNTER — Ambulatory Visit (INDEPENDENT_AMBULATORY_CARE_PROVIDER_SITE_OTHER): Payer: Medicare Other | Admitting: *Deleted

## 2015-04-23 DIAGNOSIS — Q248 Other specified congenital malformations of heart: Secondary | ICD-10-CM | POA: Diagnosis not present

## 2015-04-23 DIAGNOSIS — I498 Other specified cardiac arrhythmias: Secondary | ICD-10-CM

## 2015-04-23 NOTE — Progress Notes (Signed)
Remote ICD transmission.   

## 2015-04-23 NOTE — Telephone Encounter (Signed)
Pt sent transmission in 5 minutes ago,wants to know if you received it? If not at home ,please call [phone]

## 2015-04-23 NOTE — Telephone Encounter (Signed)
LMOVM for pt informing him that we did receive his remote transmission.

## 2015-04-26 ENCOUNTER — Telehealth: Payer: Self-pay

## 2015-04-26 NOTE — Telephone Encounter (Signed)
Brad Brown [phone] or [phone]  Saige dropped off a Disability Parking Placard to be filled out, he would like for you to call when complete. Placed in [place] at registration.

## 2015-04-29 DIAGNOSIS — Z7689 Persons encountering health services in other specified circumstances: Secondary | ICD-10-CM

## 2015-04-30 NOTE — Telephone Encounter (Signed)
Left message on voicemail Form placed in front office for pick up

## 2015-05-03 NOTE — Telephone Encounter (Signed)
Brad Brown came back in with his disability form and he would like a new one filled out with 1 or 3 picked so he can get it for 5 years. -- placed in the RX box.

## 2015-05-09 NOTE — Telephone Encounter (Signed)
Pt came into the office and was told he only qualifies for only one of the options on form--pt expressed understanding

## 2015-05-12 LAB — CUP PACEART REMOTE DEVICE CHECK
Battery Remaining Longevity: 18 mo
Battery Voltage: 2.54 V
HighPow Impedance: 54 Ohm
Implantable Lead Implant Date: 20080527
Implantable Lead Location: 753860
Implantable Lead Model: 7121
Lead Channel Pacing Threshold Pulse Width: 0.5 ms
Lead Channel Setting Pacing Amplitude: 2.5 V
Lead Channel Setting Pacing Pulse Width: 0.5 ms
Lead Channel Setting Sensing Sensitivity: 0.3 mV
MDC IDC MSMT LEADCHNL RV IMPEDANCE VALUE: 480 Ohm
MDC IDC MSMT LEADCHNL RV PACING THRESHOLD AMPLITUDE: 1 V
MDC IDC MSMT LEADCHNL RV SENSING INTR AMPL: 12 mV
MDC IDC PG SERIAL: 449377
MDC IDC SESS DTM: 20170131132527
MDC IDC STAT BRADY RV PERCENT PACED: 1 %

## 2015-05-17 ENCOUNTER — Encounter: Payer: Self-pay | Admitting: Cardiology

## 2015-05-20 ENCOUNTER — Telehealth: Payer: Self-pay | Admitting: Pulmonary Disease

## 2015-05-20 NOTE — Telephone Encounter (Signed)
Will route to [place] to follow up on.

## 2015-05-22 NOTE — Telephone Encounter (Signed)
This has been received and placed on SNs cart to be signed.

## 2015-05-24 NOTE — Telephone Encounter (Signed)
Daneil Dan - has this been completed?  Please advise.

## 2015-05-24 NOTE — Telephone Encounter (Signed)
Per SN:  This has already been taken care of. Nothing further needed.

## 2015-06-04 ENCOUNTER — Other Ambulatory Visit: Payer: Self-pay | Admitting: Internal Medicine

## 2015-06-06 ENCOUNTER — Encounter: Payer: Self-pay | Admitting: Internal Medicine

## 2015-06-22 LAB — HM DIABETES EYE EXAM

## 2015-07-10 DIAGNOSIS — L821 Other seborrheic keratosis: Secondary | ICD-10-CM | POA: Diagnosis not present

## 2015-07-10 DIAGNOSIS — D225 Melanocytic nevi of trunk: Secondary | ICD-10-CM | POA: Diagnosis not present

## 2015-07-10 DIAGNOSIS — L919 Hypertrophic disorder of the skin, unspecified: Secondary | ICD-10-CM | POA: Diagnosis not present

## 2015-07-10 DIAGNOSIS — L814 Other melanin hyperpigmentation: Secondary | ICD-10-CM | POA: Diagnosis not present

## 2015-07-25 ENCOUNTER — Encounter: Payer: Medicare Other | Admitting: Internal Medicine

## 2015-07-25 DIAGNOSIS — D649 Anemia, unspecified: Secondary | ICD-10-CM | POA: Diagnosis not present

## 2015-07-25 DIAGNOSIS — N182 Chronic kidney disease, stage 2 (mild): Secondary | ICD-10-CM | POA: Diagnosis not present

## 2015-07-25 DIAGNOSIS — I480 Paroxysmal atrial fibrillation: Secondary | ICD-10-CM | POA: Diagnosis not present

## 2015-07-25 DIAGNOSIS — N2581 Secondary hyperparathyroidism of renal origin: Secondary | ICD-10-CM | POA: Diagnosis not present

## 2015-08-09 ENCOUNTER — Encounter: Payer: Self-pay | Admitting: Internal Medicine

## 2015-08-09 ENCOUNTER — Ambulatory Visit (INDEPENDENT_AMBULATORY_CARE_PROVIDER_SITE_OTHER): Payer: Medicare Other | Admitting: Internal Medicine

## 2015-08-09 VITALS — BP 182/102 | HR 58 | Ht 69.0 in | Wt 202.6 lb

## 2015-08-09 DIAGNOSIS — I498 Other specified cardiac arrhythmias: Secondary | ICD-10-CM

## 2015-08-09 DIAGNOSIS — Q248 Other specified congenital malformations of heart: Secondary | ICD-10-CM | POA: Diagnosis not present

## 2015-08-09 LAB — CUP PACEART INCLINIC DEVICE CHECK
Battery Remaining Longevity: 7 mo
Battery Voltage: 2.51 V
HighPow Impedance: 49.6463
Lead Channel Impedance Value: 425 Ohm
Lead Channel Pacing Threshold Pulse Width: 0.5 ms
Lead Channel Sensing Intrinsic Amplitude: 10.3 mV
Lead Channel Setting Pacing Amplitude: 2.5 V
Lead Channel Setting Sensing Sensitivity: 0.3 mV
MDC IDC LEAD IMPLANT DT: 20080527
MDC IDC LEAD LOCATION: 753860
MDC IDC LEAD MODEL: 7121
MDC IDC MSMT LEADCHNL RV PACING THRESHOLD AMPLITUDE: 1 V
MDC IDC SESS DTM: 20170519093104
MDC IDC SET LEADCHNL RV PACING PULSEWIDTH: 0.5 ms
MDC IDC STAT BRADY RV PERCENT PACED: 0.03 %
Pulse Gen Serial Number: 449377

## 2015-08-09 NOTE — Progress Notes (Signed)
HPI [name] returns today for followup. He is a very pleasant 70 year old man with coronary artery disease, and ischemic cardiomyopathy, PAF, Brugada syndrome, status post ICD implantation. He returns today for followup. The patient denies chest pain. He has class II heart failure symptoms. The patient has done well in the interim. No peripheral edema.  He is taking Xarelto.  Since his last visit, he has had no ICD therapies. He has gotten poison ivy in the interim. He has not taken his morning meds. Allergies  Allergen Reactions  . Lipitor [Atorvastatin]     REACTION: pt states rash from Lipitor     Current Outpatient Prescriptions  Medication Sig Dispense Refill  . ciclopirox (LOPROX) 0.77 % cream Apply topically 2 (two) times daily. 15 g 0  . clotrimazole-betamethasone (LOTRISONE) cream Apply to groin rash 2 times daily 30 g 1  . fluticasone (FLOVENT HFA) 110 MCG/ACT inhaler Inhale 1 puff into the lungs 2 (two) times daily. 1 Inhaler 12  . glipiZIDE (GLUCOTROL) 5 MG tablet TAKE 1 TABLET BY MOUTH TWICE A DAY BEFORE A MEAL 180 tablet 1  . levETIRAcetam (KEPPRA) 500 MG tablet Take one tablet by mouth in the morning and two tablets by mouth at night    . losartan (COZAAR) 25 MG tablet TAKE 1 TABLET (25 MG TOTAL) BY MOUTH DAILY. 90 tablet 3  . meloxicam (MOBIC) 7.5 MG tablet TAKE 1 TABLET (7.5 MG TOTAL) BY MOUTH DAILY. 90 tablet 1  . methylPREDNISolone (MEDROL) 8 MG tablet Take as directed 16 tablet 0  . metoprolol tartrate (LOPRESSOR) 25 MG tablet Take 12.5 mg by mouth 2 (two) times daily.    . Multiple Vitamin (MULTIVITAMIN) tablet Take 1 tablet by mouth daily.      . naftifine (NAFTIN) 1 % cream Apply 1 application topically 2 (two) times daily as needed (For  infection between toes).     . Niacin, Antihyperlipidemic, 500 MG TABS Take 500 mg by mouth daily. 30 tablet 2  . Omega-3 Fatty Acids (FISH OIL) 1200 MG CAPS Take 1 capsule by mouth daily.    . Rivaroxaban (XARELTO) 20 MG TABS  tablet Take 20 mg by mouth daily with supper.    . simvastatin (ZOCOR) 20 MG tablet Take one half tablet by mouth daily at bedtime     . tamsulosin (FLOMAX) 0.4 MG CAPS capsule Take 0.4 mg by mouth daily.  5   No current facility-administered medications for this visit.     Past Medical History  Diagnosis Date  . COPD (chronic obstructive pulmonary disease) ([place])   . CAD (coronary artery disease)   . 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 Automatic implantable cardiac defibrillator in situ   . Hyperlipidemia   . Borderline diabetes mellitus   . GERD (gastroesophageal reflux disease)   . Colonic polyp 2004    hyperplastic   . History of renal insufficiency syndrome   . Stroke ([place])   . Seizure disorder ([place])   . Monoclonal gammopathy   . Arthritis   . Atrial fibrillation ([place])   . HTN (hypertension)     ROS:   All systems reviewed and negative except as noted in the HPI.   Past Surgical History  Procedure Laterality Date  . Cardiac catheterization  1987    Showed distal left circumflex 100% occluded   . Cardiac defibrillator placement  08/17/2006    Implantation of a St. Jude single chamber defibrillator  . Inguinal hernia repair Left  Family History  Problem Relation Age of Onset  . Heart attack Brother   . Colon cancer Brother   . Heart attack Mother      Social History   Social History  . Marital Status: Single    Spouse Name: N/A  . Number of Children: 1  . Years of Education: N/A   Occupational History  . retired    Social History Main Topics  . Smoking status: Former Smoker -- 0.50 packs/day for 15 years    Types: Cigarettes    Quit date: 03/23/2004  . Smokeless tobacco: Never Used  . Alcohol Use: 0.6 oz/week    1 Glasses of wine per week     Comment: rare--wine  . Drug Use: No  . Sexual Activity: Not on file   Other Topics Concern  . Not on file   Social History Narrative   ICD-St. Jude  Remote-Yes     BP  182/102 mmHg  Pulse 58  Ht 5\' 9"  (1.753 m)  Wt 202 lb 9.6 oz (91.899 kg)  BMI 29.91 kg/m2  Physical Exam:  Well appearing 70 year old man,NAD HEENT: Unremarkable Neck:  No JVD, no thyromegally Lungs:  Clear with no wheezes, rales, or rhonchi. HEART:  Regular rate rhythm, no murmurs, no rubs, no clicks, soft S4. Abd:  soft, positive bowel sounds, no organomegally, no rebound, no guarding Ext:  2 plus pulses, no edema, no cyanosis, no clubbing Skin:  No rashes no nodules Neuro:  CN II through XII intact, motor grossly intact   DEVICE  Normal device function.  See PaceArt for details.   Assess/Plan: 1. 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 2. HTN - he has had no blood pressure meds this a.m. He admits to some sodium indiscretion. 3. ICD - his device is approaching ERI. We discussed ICD gen change.  [name].D.

## 2015-08-09 NOTE — Patient Instructions (Signed)
Medication Instructions:  Your physician recommends that you continue on your current medications as directed. Please refer to the Current Medication list given to you today.   Labwork: None ordered   Testing/Procedures: None ordered   Follow-Up: Your physician wants you to follow-up in: 12 months with Dr Knox Saliva will receive a reminder letter in the mail two months in advance. If you don't receive a letter, please call our office to schedule the follow-up appointment.  Remote monitoring is used to monitor your ICD from home. This monitoring reduces the number of office visits required to check your device to one time per year. It allows [place] to keep an eye on the functioning of your device to ensure it is working properly. You are scheduled for a device check from home on 11/08/15. You may send your transmission at any time that day. If you have a wireless device, the transmission will be sent automatically. After your physician reviews your transmission, you will receive a postcard with your next transmission date.     Any Other Special Instructions Will Be Listed Below (If Applicable).     If you need a refill on your cardiac medications before your next appointment, please call your pharmacy.

## 2015-08-14 ENCOUNTER — Other Ambulatory Visit: Payer: Self-pay | Admitting: Internal Medicine

## 2015-08-14 NOTE — Telephone Encounter (Signed)
Last filled 01/2016---please advise if okay for pt to continue---error msg came up in reference to pt taking Xarelto

## 2015-08-15 NOTE — Telephone Encounter (Signed)
He should not be taking if he is taking Xarelto

## 2015-08-16 NOTE — Telephone Encounter (Signed)
Left message on voicemail.

## 2015-08-16 NOTE — Telephone Encounter (Signed)
Pt is aware.  

## 2015-08-22 ENCOUNTER — Encounter: Payer: Self-pay | Admitting: Internal Medicine

## 2015-08-22 ENCOUNTER — Ambulatory Visit (INDEPENDENT_AMBULATORY_CARE_PROVIDER_SITE_OTHER): Payer: Medicare Other | Admitting: Internal Medicine

## 2015-08-22 VITALS — BP 138/84 | HR 55 | Temp 97.8°F | Wt 202.5 lb

## 2015-08-22 DIAGNOSIS — B9789 Other viral agents as the cause of diseases classified elsewhere: Secondary | ICD-10-CM

## 2015-08-22 DIAGNOSIS — J069 Acute upper respiratory infection, unspecified: Secondary | ICD-10-CM

## 2015-08-22 DIAGNOSIS — L03114 Cellulitis of left upper limb: Secondary | ICD-10-CM | POA: Diagnosis not present

## 2015-08-22 MED ORDER — HYDROCODONE-HOMATROPINE 5-1.5 MG/5ML PO SYRP: 5.0000 mL | ORAL_SOLUTION | Freq: Three times a day (TID) | ORAL | Status: DC | PRN

## 2015-08-22 MED ORDER — DOXYCYCLINE HYCLATE 50 MG PO CAPS: 50.0000 mg | ORAL_CAPSULE | Freq: Two times a day (BID) | ORAL | Status: DC

## 2015-08-22 MED ORDER — CEFTRIAXONE SODIUM 1 G IJ SOLR
1.0000 g | Freq: Once | INTRAMUSCULAR | Status: AC
  Administered 2015-08-22: 1 g via INTRAMUSCULAR

## 2015-08-22 NOTE — Progress Notes (Signed)
Pre visit review using our clinic review tool, if applicable. No additional management support is needed unless otherwise documented below in the visit note. 

## 2015-08-22 NOTE — Patient Instructions (Signed)

## 2015-08-22 NOTE — Progress Notes (Signed)
Subjective:    Patient ID: Brad Brown, male    DOB: 21-Sep-1945, 70 y.o.   MRN: [id number]  HPI  Pt presents to the clinic today with c/o nasal congestion and cough. This started 3-4 days ago. He is blowing clear mucous out of his nose. The cough is productive of dark, yellow mucous. He denies fever, chills or shortness of breath. He has been taking cough syrup and Flovent with minimal relief. He does have COPD. He has not had sick contacts that he is aware of.  He also reports a bug bite to his left upper extremity. He noticed this 2 days ago. The area itches. He has also noticed some swelling and redness. He has not tried anything OTC.  Review of Systems      Past Medical History  Diagnosis Date  . COPD (chronic obstructive pulmonary disease) ([place])   . CAD (coronary artery disease)   . 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 Automatic implantable cardiac defibrillator in situ   . Hyperlipidemia   . Borderline diabetes mellitus   . GERD (gastroesophageal reflux disease)   . Colonic polyp 2004    hyperplastic   . History of renal insufficiency syndrome   . Stroke ([place])   . Seizure disorder ([place])   . Monoclonal gammopathy   . Arthritis   . Atrial fibrillation ([place])   . HTN (hypertension)     Current Outpatient Prescriptions  Medication Sig Dispense Refill  . ciclopirox (LOPROX) 0.77 % cream Apply topically 2 (two) times daily. 15 g 0  . clotrimazole-betamethasone (LOTRISONE) cream Apply to groin rash 2 times daily 30 g 1  . fluticasone (FLOVENT HFA) 110 MCG/ACT inhaler Inhale 1 puff into the lungs 2 (two) times daily. 1 Inhaler 12  . glipiZIDE (GLUCOTROL) 5 MG tablet TAKE 1 TABLET BY MOUTH TWICE A DAY BEFORE A MEAL 180 tablet 1  . levETIRAcetam (KEPPRA) 500 MG tablet Take one tablet by mouth in the morning and two tablets by mouth at night    . losartan (COZAAR) 25 MG tablet TAKE 1 TABLET (25 MG TOTAL) BY MOUTH DAILY. 90 tablet 3  . methylPREDNISolone  (MEDROL) 8 MG tablet Take as directed 16 tablet 0  . metoprolol tartrate (LOPRESSOR) 25 MG tablet Take 12.5 mg by mouth 2 (two) times daily.    . Multiple Vitamin (MULTIVITAMIN) tablet Take 1 tablet by mouth daily.      . naftifine (NAFTIN) 1 % cream Apply 1 application topically 2 (two) times daily as needed (For  infection between toes).     . Niacin, Antihyperlipidemic, 500 MG TABS Take 500 mg by mouth daily. 30 tablet 2  . Omega-3 Fatty Acids (FISH OIL) 1200 MG CAPS Take 1 capsule by mouth daily.    . Rivaroxaban (XARELTO) 20 MG TABS tablet Take 20 mg by mouth daily with supper.    . simvastatin (ZOCOR) 20 MG tablet Take one half tablet by mouth daily at bedtime     . tamsulosin (FLOMAX) 0.4 MG CAPS capsule Take 0.4 mg by mouth daily.  5  . doxycycline (VIBRAMYCIN) 50 MG capsule Take 1 capsule (50 mg total) by mouth 2 (two) times daily. 20 capsule 0  . HYDROcodone-homatropine (HYCODAN) 5-1.5 MG/5ML syrup Take 5 mLs by mouth every 8 (eight) hours as needed for cough. 120 mL 0  . meloxicam (MOBIC) 7.5 MG tablet TAKE 1 TABLET (7.5 MG TOTAL) BY MOUTH DAILY. (Patient not taking: Reported on 08/22/2015) 90 tablet  1   No current facility-administered medications for this visit.    Allergies  Allergen Reactions  . Lipitor [Atorvastatin]     REACTION: pt states rash from Lipitor    Family History  Problem Relation Age of Onset  . Heart attack Brother   . Colon cancer Brother   . Heart attack Mother     Social History   Social History  . Marital Status: Single    Spouse Name: N/A  . Number of Children: 1  . Years of Education: N/A   Occupational History  . retired    Social History Main Topics  . Smoking status: Former Smoker -- 0.50 packs/day for 15 years    Types: Cigarettes    Quit date: 03/23/2004  . Smokeless tobacco: Never Used  . Alcohol Use: 0.6 oz/week    1 Glasses of wine per week     Comment: rare--wine  . Drug Use: No  . Sexual Activity: Not on file   Other  Topics Concern  . Not on file   Social History Narrative   ICD-St. Jude  Remote-Yes     Constitutional: Denies fever, malaise, fatigue, headache or abrupt weight changes.  HEENT: Pt reports nasal congestion. Denies eye pain, eye redness, ear pain, ringing in the ears, wax buildup, runny nose, bloody nose, or sore throat. Respiratory: Pt reports cough. Denies difficulty breathing, shortness of breath, or sputum production.   Cardiovascular: Denies chest pain, chest tightness, palpitations or swelling in the hands or feet.  Skin: Pt reports redness and swelling of LUE. Denies, rashes or ulcercations.    No other specific complaints in a complete review of systems (except as listed in HPI above).  Objective:   Physical Exam   BP 138/84 mmHg  Pulse 55  Temp(Src) 97.8 F (36.6 C) (Oral)  Wt 202 lb 8 oz (91.853 kg)  SpO2 97% Wt Readings from Last 3 Encounters:  08/22/15 202 lb 8 oz (91.853 kg)  08/09/15 202 lb 9.6 oz (91.899 kg)  03/07/15 205 lb (92.987 kg)    General: Appears his stated age, well developed, well nourished in NAD. Skin: 1 cm round scabbed papule noted of left upper arm. Large surrounding area of cellulitis noted. HEENT: Head: normal shape and size, no sinus tenderness noted; Eyes: sclera white, no icterus, conjunctiva pink; Ears: Tm's gray and intact, normal light reflex; Nose: mucosa boggy and moist, septum midline; Throat/Mouth: Teeth present, mucosa erythematous and moist, + PND, no exudate, lesions or ulcerations noted.  Neck:  No adenopathy noted Cardiovascular: Normal rate and rhythm. S1,S2 noted.  No murmur, rubs or gallops noted. Pulmonary/Chest: Normal effort and positive vesicular breath sounds. No respiratory distress. No wheezes, rales or ronchi noted.    BMET    Component Value Date/Time   NA 139 01/28/2015 1238   NA 135 11/06/2014 0957   K 4.2 01/28/2015 1238   K 4.2 11/06/2014 0957   CL 103 11/06/2014 0957   CL 104 01/06/2012 1355   CO2 23  01/28/2015 1238   CO2 27 11/06/2014 0957   GLUCOSE 123 01/28/2015 1238   GLUCOSE 142* 11/06/2014 0957   GLUCOSE 77 01/06/2012 1355   BUN 14.2 01/28/2015 1238   BUN 13 11/06/2014 0957   CREATININE 1.5* 01/28/2015 1238   CREATININE 1.36 11/06/2014 0957   CREATININE 1.40* 11/04/2012 1622   CALCIUM 9.5 01/28/2015 1238   CALCIUM 9.6 11/06/2014 0957   GFRNONAA 74.56 02/03/2010 1110   GFRAA 57 02/08/2008 1040  Lipid Panel     Component Value Date/Time   CHOL 128 02/28/2015 1523   TRIG 220.0* 02/28/2015 1523   HDL 37.70* 02/28/2015 1523   CHOLHDL 3 02/28/2015 1523   VLDL 44.0* 02/28/2015 1523   LDLCALC 57 06/14/2013 1235    CBC    Component Value Date/Time   WBC 5.3 01/28/2015 1238   WBC 5.3 11/06/2014 0957   RBC 4.43 01/28/2015 1238   RBC 4.74 11/06/2014 0957   HGB 13.7 01/28/2015 1238   HGB 14.6 11/06/2014 0957   HCT 40.2 01/28/2015 1238   HCT 43.3 11/06/2014 0957   PLT 170 01/28/2015 1238   PLT 171.0 11/06/2014 0957   MCV 90.7 01/28/2015 1238   MCV 91.4 11/06/2014 0957   MCH 30.9 01/28/2015 1238   MCH 31.2 11/04/2012 1622   MCHC 34.1 01/28/2015 1238   MCHC 33.6 11/06/2014 0957   RDW 13.0 01/28/2015 1238   RDW 12.9 11/06/2014 0957   LYMPHSABS 1.2 01/28/2015 1238   LYMPHSABS 1.2 11/04/2012 1622   MONOABS 0.3 01/28/2015 1238   MONOABS 0.4 11/04/2012 1622   EOSABS 0.1 01/28/2015 1238   EOSABS 0.1 11/04/2012 1622   BASOSABS 0.0 01/28/2015 1238   BASOSABS 0.0 11/04/2012 1622    Hgb A1C Lab Results  Component Value Date   HGBA1C 6.9* 02/28/2015        Assessment & Plan:   Viral URI with cough:  Advised him to try Claritin and Flonase OTC RX for Hycodan for cough  Cellulitis of left upper extremity:  Area marked with pen Rocephin 1 gm IM today eRx for Doxycycline 100 mg BID x 10 days Return precautions given  RTC as needed or if symptoms persist or worsens

## 2015-11-11 ENCOUNTER — Ambulatory Visit (INDEPENDENT_AMBULATORY_CARE_PROVIDER_SITE_OTHER): Payer: Medicare Other | Admitting: *Deleted

## 2015-11-11 ENCOUNTER — Telehealth: Payer: Self-pay | Admitting: Internal Medicine

## 2015-11-11 ENCOUNTER — Telehealth: Payer: Self-pay | Admitting: Cardiology

## 2015-11-11 DIAGNOSIS — Z95810 Presence of automatic (implantable) cardiac defibrillator: Secondary | ICD-10-CM

## 2015-11-11 DIAGNOSIS — I498 Other specified cardiac arrhythmias: Secondary | ICD-10-CM

## 2015-11-11 NOTE — Telephone Encounter (Signed)
F/U        FYI     Pt called confirming that he will send the remote transmission as soon as he gets home.

## 2015-11-11 NOTE — Telephone Encounter (Signed)
LMOVM reminding pt to send remote transmission.   

## 2015-11-11 NOTE — Progress Notes (Signed)
Remote ICD transmission.   

## 2015-11-11 NOTE — Telephone Encounter (Signed)
LMOVM informing pt that we have received his remote transmission.

## 2015-11-13 ENCOUNTER — Encounter: Payer: Self-pay | Admitting: Cardiology

## 2015-11-20 LAB — CUP PACEART REMOTE DEVICE CHECK
Brady Statistic RV Percent Paced: 1 %
Date Time Interrogation Session: 20170821160804
HIGH POWER IMPEDANCE MEASURED VALUE: 51 Ohm
Implantable Lead Implant Date: 20080527
Implantable Lead Location: 753860
Implantable Lead Model: 7121
Lead Channel Pacing Threshold Amplitude: 1 V
Lead Channel Setting Sensing Sensitivity: 0.3 mV
MDC IDC MSMT BATTERY REMAINING LONGEVITY: 5 mo
MDC IDC MSMT BATTERY VOLTAGE: 2.5 V
MDC IDC MSMT LEADCHNL RV IMPEDANCE VALUE: 430 Ohm
MDC IDC MSMT LEADCHNL RV PACING THRESHOLD PULSEWIDTH: 0.5 ms
MDC IDC MSMT LEADCHNL RV SENSING INTR AMPL: 9.5 mV
MDC IDC SET LEADCHNL RV PACING AMPLITUDE: 2.5 V
MDC IDC SET LEADCHNL RV PACING PULSEWIDTH: 0.5 ms
Pulse Gen Serial Number: 449377

## 2016-01-27 ENCOUNTER — Telehealth: Payer: Self-pay | Admitting: Internal Medicine

## 2016-01-27 NOTE — Telephone Encounter (Signed)
error 

## 2016-01-27 NOTE — Telephone Encounter (Signed)
Spoke w/ pt and informed him that his device has reached ERI. Informed him that he has about 3 months left on his battery life. Informed him that he will get the alert for the next 16 days and it will time out. Even though it times out the problem is still there. Informed him that a scheduler will call him to schedule an appt w/ MD / PA / NP. Pt verbalized understanding.

## 2016-01-27 NOTE — Telephone Encounter (Signed)
Spoke w/ pt and he informed me that his device vibrated. He is going to send a remote transmission. Informed him once it is received a device tech will review and someone will call him back. Pt verbalized understanding.

## 2016-01-30 ENCOUNTER — Other Ambulatory Visit (HOSPITAL_BASED_OUTPATIENT_CLINIC_OR_DEPARTMENT_OTHER): Payer: Medicare Other

## 2016-01-30 DIAGNOSIS — D472 Monoclonal gammopathy: Secondary | ICD-10-CM

## 2016-01-30 LAB — CBC WITH DIFFERENTIAL/PLATELET
BASO%: 1.1 % (ref 0.0–2.0)
Basophils Absolute: 0 10*3/uL (ref 0.0–0.1)
EOS ABS: 0.1 10*3/uL (ref 0.0–0.5)
EOS%: 2.2 % (ref 0.0–7.0)
HCT: 41.5 % (ref 38.4–49.9)
HGB: 14.1 g/dL (ref 13.0–17.1)
LYMPH%: 32.5 % (ref 14.0–49.0)
MCH: 30.5 pg (ref 27.2–33.4)
MCHC: 34 g/dL (ref 32.0–36.0)
MCV: 89.8 fL (ref 79.3–98.0)
MONO#: 0.2 10*3/uL (ref 0.1–0.9)
MONO%: 5.6 % (ref 0.0–14.0)
NEUT%: 58.6 % (ref 39.0–75.0)
NEUTROS ABS: 2.1 10*3/uL (ref 1.5–6.5)
Platelets: 163 10*3/uL (ref 140–400)
RBC: 4.62 10*6/uL (ref 4.20–5.82)
RDW: 12.4 % (ref 11.0–14.6)
WBC: 3.6 10*3/uL — AB (ref 4.0–10.3)
lymph#: 1.2 10*3/uL (ref 0.9–3.3)

## 2016-01-30 LAB — COMPREHENSIVE METABOLIC PANEL
ALT: 28 U/L (ref 0–55)
AST: 20 U/L (ref 5–34)
Albumin: 3.7 g/dL (ref 3.5–5.0)
Alkaline Phosphatase: 78 U/L (ref 40–150)
Anion Gap: 8 mEq/L (ref 3–11)
BUN: 15 mg/dL (ref 7.0–26.0)
CO2: 26 meq/L (ref 22–29)
Calcium: 9.4 mg/dL (ref 8.4–10.4)
Chloride: 106 mEq/L (ref 98–109)
Creatinine: 1.4 mg/dL — ABNORMAL HIGH (ref 0.7–1.3)
EGFR: 61 mL/min/{1.73_m2} — AB (ref >90)
GLUCOSE: 191 mg/dL — AB (ref 70–140)
POTASSIUM: 4.4 meq/L (ref 3.5–5.1)
SODIUM: 140 meq/L (ref 136–145)
TOTAL PROTEIN: 7.5 g/dL (ref 6.4–8.3)
Total Bilirubin: 0.82 mg/dL (ref 0.20–1.20)

## 2016-01-31 LAB — KAPPA/LAMBDA LIGHT CHAINS
IG KAPPA FREE LIGHT CHAIN: 88.2 mg/L — AB (ref 3.3–19.4)
Ig Lambda Free Light Chain: 7.3 mg/L (ref 5.7–26.3)
Kappa/Lambda FluidC Ratio: 12.08 — ABNORMAL HIGH (ref 0.26–1.65)

## 2016-02-01 NOTE — Progress Notes (Signed)
Cardiology Office Note Date:  02/03/2016  Patient ID:  Kolbie, Lepkowski 04/15/1945, MRN [id number] PCP:  Webb Silversmith, NP  Electrophysiologist:  Dr. Lovena Le   Chief Complaint: called in to discuss gen cange  History of Present Illness: ARTIN MCEUEN is a 70 y.o. male with history of Brugada syndrome, VF,  w/ICD, COPD, CAD (cath 1987 noted below), HLD, DM, PAFib on a/c, seizure d/o, arthritis, comes to the office today noting his ICD has reached ERI by a remote check.  He is being seen for Dr. Lovena Le, lat seen by him in May, doing well at that time.  He reports feeling very well, no CP, palpitations or SOB, no exertional intolerances, no shocks.  He denies any unusual bleeding with his Xarelto, will infrequently have very minor hemorrhoidal bleeding only, unchanged for years.   DEVICE information:  SJM single chamber ICD, implanted 08/17/06, Dr. Lovena Le The patient reports 3 shocks over the life of his device, the last about 2 years ago, all associated with nausea/vomiting episodes  Past Medical History:  Diagnosis Date  . Arthritis   . Atrial fibrillation ([place])   . Automatic implantable cardiac defibrillator in situ   . Borderline diabetes mellitus   . CAD (coronary artery disease)   . Colonic polyp 2004   hyperplastic   . COPD (chronic obstructive pulmonary disease) ([place])   . GERD (gastroesophageal reflux disease)   . History of renal insufficiency syndrome   . HTN (hypertension)   . Hyperlipidemia   . Monoclonal gammopathy   . 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 Seizure disorder ([place])   . Stroke [facility])     Past Surgical History:  Procedure Laterality Date  . CARDIAC CATHETERIZATION  1987   Showed distal left circumflex 100% occluded   . CARDIAC DEFIBRILLATOR PLACEMENT  08/17/2006   Implantation of a St. Jude single chamber defibrillator  . INGUINAL HERNIA REPAIR Left     Current Outpatient Prescriptions  Medication Sig Dispense Refill  .  ciclopirox (LOPROX) 0.77 % cream Apply topically 2 (two) times daily. 15 g 0  . clotrimazole-betamethasone (LOTRISONE) cream Apply to groin rash 2 times daily 30 g 1  . doxycycline (VIBRAMYCIN) 50 MG capsule Take 1 capsule (50 mg total) by mouth 2 (two) times daily. 20 capsule 0  . fluticasone (FLOVENT HFA) 110 MCG/ACT inhaler Inhale 1 puff into the lungs 2 (two) times daily. 1 Inhaler 12  . glipiZIDE (GLUCOTROL) 5 MG tablet TAKE 1 TABLET BY MOUTH TWICE A DAY BEFORE A MEAL 180 tablet 1  . HYDROcodone-homatropine (HYCODAN) 5-1.5 MG/5ML syrup Take 5 mLs by mouth every 8 (eight) hours as needed for cough. 120 mL 0  . levETIRAcetam (KEPPRA) 500 MG tablet Take one tablet by mouth in the morning and two tablets by mouth at night    . losartan (COZAAR) 25 MG tablet TAKE 1 TABLET (25 MG TOTAL) BY MOUTH DAILY. 90 tablet 3  . meloxicam (MOBIC) 7.5 MG tablet TAKE 1 TABLET (7.5 MG TOTAL) BY MOUTH DAILY. 90 tablet 1  . methylPREDNISolone (MEDROL) 8 MG tablet Take as directed 16 tablet 0  . metoprolol tartrate (LOPRESSOR) 25 MG tablet Take 12.5 mg by mouth 2 (two) times daily.    . Multiple Vitamin (MULTIVITAMIN) tablet Take 1 tablet by mouth daily.      . naftifine (NAFTIN) 1 % cream Apply 1 application topically 2 (two) times daily as needed (For  infection between toes).     Marland Kitchen  Niacin, Antihyperlipidemic, 500 MG TABS Take 500 mg by mouth daily. 30 tablet 2  . Omega-3 Fatty Acids (FISH OIL) 1200 MG CAPS Take 1 capsule by mouth daily.    . Rivaroxaban (XARELTO) 20 MG TABS tablet Take 20 mg by mouth daily with supper.    . simvastatin (ZOCOR) 20 MG tablet Take one half tablet by mouth daily at bedtime     . tamsulosin (FLOMAX) 0.4 MG CAPS capsule Take 0.4 mg by mouth daily.  5   No current facility-administered medications for this visit.     Allergies:   Lipitor [atorvastatin]   Social History:  The patient  reports that he quit smoking about 11 years ago. His smoking use included Cigarettes. He has a 7.50  pack-year smoking history. He has never used smokeless tobacco. He reports that he drinks about 0.6 oz of alcohol per week . He reports that he does not use drugs.   Family History:  The patient's family history includes Colon cancer in his brother; Heart attack in his brother and mother.  ROS:  Please see the history of present illness.  All other systems are reviewed and otherwise negative.   PHYSICAL EXAM:  VS:  BP 132/90   Pulse (!) 53   Ht 5\' 9"  (1.753 m)   Wt 198 lb 9.6 oz (90.1 kg)   BMI 29.33 kg/m  BMI: Body mass index is 29.33 kg/m. Well nourished, well developed, in no acute distress  HEENT: normocephalic, atraumatic  Neck: no JVD, carotid bruits or masses Cardiac:  RRR; no significant murmurs, no rubs, or gallops Lungs:  clear to auscultation bilaterally, no wheezing, rhonchi or rales  Abd: soft, nontender MS: no deformity or atrophy Ext: no edema  Skin: warm and dry, no rash Neuro:  No gross deficits appreciated Psych: euthymic mood, full affect  ICD site is stable, no tethering or discomfort   EKG:  Done today and reviewed by myself shows SB, 1st degree AVBlock,  ICD interrogation today and reviewed by myself: battery reached ERI 01/26/16, lead status is stable, no VT/VF, >99% V sensed  05/03/06: Stress myoview Abnormal with small inferolateral infarct, no ischemia.  Normal LV size and function.  Comparison with prior study of 7/27/197, no significant interval change  11/21/03: TTE SUMMARY - Overall left ventricular systolic function was preserved. Left    ventricular ejection fraction was estimated , range being 50    % to 55 %.. There appeared to be akinesis of the inferobasal    wall. - Aortic valve thickness was mildly increased. There was trivial    aortic valvular regurgitation. - There was mild atheroma of the descending aorta. - The left atrium was mildly dilated. There was no left atrial    appendage thrombus identified.  There was mild continuous    spontaneous echo contrast ("smoke") in the left atrial    appendage c/w low flow state. 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 - No IAFC by color doppler. No intracardiac shunt was detected by    contrast study with agitated saline.   Recent Labs: 01/30/2016: ALT 28; BUN 15.0; Creatinine 1.4; HGB 14.1; Platelets 163; Potassium 4.4; Sodium 140  02/28/2015: Cholesterol 128; Direct LDL 30.0; HDL 37.70; Total CHOL/HDL Ratio 3; Triglycerides 220.0; VLDL 44.0   Estimated Creatinine Clearance (by C-G formula based on SCr of 1.4 mg/dL (H)) Male: 46 mL/min Male: 56.1 mL/min  Wt Readings from Last 3 Encounters:  02/03/16 198 lb 9.6 oz (90.1 kg)  08/22/15 202 lb 8 oz (91.9 kg)  08/09/15 202 lb 9.6 oz (91.9 kg)     Other studies reviewed: Additional studies/records reviewed today include: summarized above  ASSESSMENT AND PLAN:  1. Brugada syndrome (notes mention hx VF)     SJM ICD is at [facility]     discussed risks/benefits of generator change, he would like to proceed  2. CAD     stable, no symptoms     On BB, statin, and niacin reports labs monitored and managed withhis PMD  3. HTN     stable  4. PAFib     CHA2DS2Vasc is at least 4, on Xarelto    Disposition: F/u with routine post procedure wound check and visit, sooner if needed,  Current medicines are reviewed at length with the patient today.  The patient did not have any concerns regarding medicines.  Haywood Lasso, PA-C 02/03/2016 10:07 AM     [facility] [place] [address] [phone] (office)  [phone] (fax)

## 2016-02-03 ENCOUNTER — Ambulatory Visit (INDEPENDENT_AMBULATORY_CARE_PROVIDER_SITE_OTHER): Payer: Medicare Other | Admitting: Physician Assistant

## 2016-02-03 ENCOUNTER — Encounter: Payer: Self-pay | Admitting: Physician Assistant

## 2016-02-03 ENCOUNTER — Encounter (INDEPENDENT_AMBULATORY_CARE_PROVIDER_SITE_OTHER): Payer: Self-pay

## 2016-02-03 ENCOUNTER — Encounter: Payer: Self-pay | Admitting: *Deleted

## 2016-02-03 VITALS — BP 132/90 | HR 53 | Ht 69.0 in | Wt 198.6 lb

## 2016-02-03 DIAGNOSIS — I498 Other specified cardiac arrhythmias: Secondary | ICD-10-CM

## 2016-02-03 DIAGNOSIS — I4891 Unspecified atrial fibrillation: Secondary | ICD-10-CM

## 2016-02-03 DIAGNOSIS — I10 Essential (primary) hypertension: Secondary | ICD-10-CM | POA: Diagnosis not present

## 2016-02-03 DIAGNOSIS — I2510 Atherosclerotic heart disease of native coronary artery without angina pectoris: Secondary | ICD-10-CM

## 2016-02-03 DIAGNOSIS — I4901 Ventricular fibrillation: Secondary | ICD-10-CM

## 2016-02-03 DIAGNOSIS — Z01818 Encounter for other preprocedural examination: Secondary | ICD-10-CM | POA: Diagnosis not present

## 2016-02-03 DIAGNOSIS — I480 Paroxysmal atrial fibrillation: Secondary | ICD-10-CM

## 2016-02-03 NOTE — Patient Instructions (Addendum)
Medication Instructions:   Your physician recommends that you continue on your current medications as directed. Please refer to the Current Medication list given to you today.  If you need a refill on your cardiac medications before your next appointment, please call your pharmacy.  Labwork: RETURN ON 02/17/16 FOR LAB CBC AND BMET    Testing/Procedures: SEE LETTER FOR GEN CHANGE ON 02/21/16    Follow-Up: 10 DAY POST WOUND  FOLLOW UP  AFTER 02/21/16   90 DAY POST PHYS DEF [facility] AFTER 02/21/16.Brad Brown   Any Other Special Instructions Will Be Listed Below (If Applicable).

## 2016-02-06 ENCOUNTER — Telehealth: Payer: Self-pay | Admitting: Oncology

## 2016-02-06 ENCOUNTER — Ambulatory Visit (HOSPITAL_BASED_OUTPATIENT_CLINIC_OR_DEPARTMENT_OTHER): Payer: Medicare Other | Admitting: Oncology

## 2016-02-06 VITALS — BP 151/84 | HR 54 | Temp 97.9°F | Resp 18 | Ht 69.0 in | Wt 200.2 lb

## 2016-02-06 DIAGNOSIS — D472 Monoclonal gammopathy: Secondary | ICD-10-CM | POA: Diagnosis not present

## 2016-02-06 DIAGNOSIS — G40909 Epilepsy, unspecified, not intractable, without status epilepticus: Secondary | ICD-10-CM

## 2016-02-06 DIAGNOSIS — Q828 Other specified congenital malformations of skin: Secondary | ICD-10-CM | POA: Diagnosis not present

## 2016-02-06 DIAGNOSIS — N289 Disorder of kidney and ureter, unspecified: Secondary | ICD-10-CM

## 2016-02-06 NOTE — Progress Notes (Signed)
Hematology and Oncology Follow Up Visit  Brad Brown [id number] 1945-12-27 70 y.o. 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 without any evidence of end-organ damage. He does have mild chronic renal insufficiency which is unchanged since 2008.  Current therapy: He is under observation and surveillance.   Interim History:  Mr. Cosgriff presents today for a followup visit by himself. Since the last visit, he continues to be in excellent health and shape without any recent complaints. He denied any falls or syncope. He denied any pathological fractures or recurrent infections. He does not report any peripheral neuropathy or worsening performance status. His cardiac and kidney status have been stable and scheduled to have ICD generator change on 02/21/2016.   He does not report any headaches or blurry vision or syncope. He does not report any fevers, chills, sweats or weight loss. He does not report any chest pain, palpitation or orthopnea. He does not report leg edema cough or shortness of breath. He does not report any nausea, vomiting or difficulty eating. He does not report any lymphadenopathy or petechiae. He does not report any urinary symptoms such as dysuria or hematuria and the rest of his review of systems unremarkable.  Medications: I have reviewed the patient's current medications.  Current Outpatient Prescriptions     Medication Sig Dispense Refill  . ciclopirox (LOPROX) 0.77 % cream Apply topically 2 (two) times daily. 15 g 0  . clotrimazole-betamethasone (LOTRISONE) cream Apply to groin rash 2 times daily 30 g 1  . doxycycline (VIBRAMYCIN) 50 MG capsule Take 1 capsule (50 mg total) by mouth 2 (two) times daily. 20 capsule 0  . fluticasone (FLOVENT HFA) 110 MCG/ACT inhaler Inhale 1 puff into the lungs 2 (two) times daily. 1 Inhaler 12  . glipiZIDE  (GLUCOTROL) 5 MG tablet TAKE 1 TABLET BY MOUTH TWICE A DAY BEFORE A MEAL 180 tablet 1  . HYDROcodone-homatropine (HYCODAN) 5-1.5 MG/5ML syrup Take 5 mLs by mouth every 8 (eight) hours as needed for cough. 120 mL 0  . levETIRAcetam (KEPPRA) 500 MG tablet Take one tablet by mouth in the morning and two tablets by mouth at night    . losartan (COZAAR) 25 MG tablet TAKE 1 TABLET (25 MG TOTAL) BY MOUTH DAILY. 90 tablet 3  . meloxicam (MOBIC) 7.5 MG tablet TAKE 1 TABLET (7.5 MG TOTAL) BY MOUTH DAILY. 90 tablet 1  . methylPREDNISolone (MEDROL) 8 MG tablet Take as directed 16 tablet 0  . metoprolol tartrate (LOPRESSOR) 25 MG tablet Take 12.5 mg by mouth 2 (two) times daily.    . Multiple Vitamin (MULTIVITAMIN) tablet Take 1 tablet by mouth daily.      . naftifine (NAFTIN) 1 % cream Apply 1 application topically 2 (two) times daily as needed (For  infection between toes).     . Niacin, Antihyperlipidemic, 500 MG TABS Take 500 mg by mouth daily. 30 tablet 2  . Omega-3 Fatty Acids (FISH OIL) 1200 MG CAPS Take 1 capsule by mouth daily.    . Rivaroxaban (XARELTO) 20 MG TABS tablet Take 20 mg by mouth daily with supper.    . simvastatin (ZOCOR) 20 MG tablet Take one half tablet by mouth daily at bedtime     . tamsulosin (FLOMAX) 0.4 MG CAPS capsule Take 0.4 mg by mouth daily.  5   No current facility-administered medications for this visit.     Allergies:  Allergies  Allergen Reactions  . Lipitor [Atorvastatin] Rash    Past Medical History, Surgical history, Social history, and Family History were reviewed and updated.  Physical Exam: Blood pressure (!) 151/84, pulse (!) 54, temperature 97.9 F (36.6 C), temperature source Oral, resp. rate 18, height 5' 9"  (1.753 m), weight 200 lb 3.2 oz (90.8 kg), SpO2 100 %. ECOG: 0 General appearance: Well-appearing gentleman without distress. Head: Normocephalic, without obvious abnormality no oral thrush noted. Neck: no adenopathy Lymph nodes: Cervical,  supraclavicular, and axillary nodes normal. Heart:regular rate and rhythm, S1, S2 normal, no murmur, click, rub or gallop Lung:chest clear, no wheezing, rales, normal symmetric air entry Abdomin: soft, non-tender, without masses or organomegaly no rebound or guarding. EXT:no erythema, induration, or nodules   Lab Results: Lab Results  Component Value Date   WBC 3.6 (L) 01/30/2016   HGB 14.1 01/30/2016   HCT 41.5 01/30/2016   MCV 89.8 01/30/2016   PLT 163 01/30/2016     Chemistry      Component Value Date/Time   NA 140 01/30/2016 1129   K 4.4 01/30/2016 1129   CL 103 11/06/2014 0957   CL 104 01/06/2012 1355   CO2 26 01/30/2016 1129   BUN 15.0 01/30/2016 1129   CREATININE 1.4 (H) 01/30/2016 1129      Component Value Date/Time   CALCIUM 9.4 01/30/2016 1129   ALKPHOS 78 01/30/2016 1129   AST 20 01/30/2016 1129   ALT 28 01/30/2016 1129   BILITOT 0.82 01/30/2016 1129      Results for Lochner, JAMEEL QUANT (MRN [id number]) as of 02/06/2016 10:48  Ref. Range 01/30/2016 11:29  Ig Kappa Free Light Chain Latest Ref Range: 3.3 - 19.4 mg/L 88.2 (H)  Ig Lambda Free Light Chain Latest Ref Range: 5.7 - 26.3 mg/L 7.3  Kappa/Lambda FluidC Ratio Latest Ref Range: 0.26 - 1.65  12.08 (H)         Impression and Plan:  A 70 year old gentleman with the following issues:  1. IgG kappa monoclonal gammopathy of undetermined significance without any evidence of end-organ damage to suggest multiple myeloma. He has an elevated Kappa light chain which have not changed Since 2008 . His protein studies from November 2017 were reviewed and showed no evidence to suggest progression of disease or end organ damage. The plan is to continue with active surveillance on an annual basis. Staging workup with a bone marrow biopsy and skeletal survey will be done if there is any changes in his protein studies. 2. Renal insufficiency: His creatinine have been stable for the last 8 years ranging between  1.3-1.5. 3. Seizure disorder.  No seizure activity recorded as of late. 4. Brugada syndrome:  followed by cardiology with an ICD in place. 5. Follow-up: Will be in one year for laboratory testing and repeat physical examination.      WERXVQ,MGQQP, MD 11/16/201710:58 AM

## 2016-02-06 NOTE — Telephone Encounter (Signed)
Gave patient avs report and appointments for November 2018.

## 2016-02-10 ENCOUNTER — Ambulatory Visit (INDEPENDENT_AMBULATORY_CARE_PROVIDER_SITE_OTHER): Payer: Medicare Other | Admitting: *Deleted

## 2016-02-10 DIAGNOSIS — I4901 Ventricular fibrillation: Secondary | ICD-10-CM

## 2016-02-10 NOTE — Progress Notes (Signed)
Remote ICD transmission.   

## 2016-02-12 ENCOUNTER — Encounter: Payer: Self-pay | Admitting: Cardiology

## 2016-02-17 ENCOUNTER — Other Ambulatory Visit: Payer: Medicare Other | Admitting: *Deleted

## 2016-02-17 DIAGNOSIS — Z01818 Encounter for other preprocedural examination: Secondary | ICD-10-CM

## 2016-02-17 LAB — BASIC METABOLIC PANEL
BUN: 14 mg/dL (ref 7–25)
CHLORIDE: 104 mmol/L (ref 98–110)
CO2: 26 mmol/L (ref 20–31)
Calcium: 9.2 mg/dL (ref 8.6–10.3)
Creat: 1.29 mg/dL — ABNORMAL HIGH (ref 0.70–1.25)
GLUCOSE: 123 mg/dL — AB (ref 65–99)
POTASSIUM: 4.1 mmol/L (ref 3.5–5.3)
SODIUM: 137 mmol/L (ref 135–146)

## 2016-02-17 LAB — CBC
HEMATOCRIT: 41.5 % (ref 38.5–50.0)
HEMOGLOBIN: 14.2 g/dL (ref 13.2–17.1)
MCH: 30.5 pg (ref 27.0–33.0)
MCHC: 34.2 g/dL (ref 32.0–36.0)
MCV: 89.2 fL (ref 80.0–100.0)
MPV: 11.3 fL (ref 7.5–12.5)
Platelets: 176 10*3/uL (ref 140–400)
RBC: 4.65 MIL/uL (ref 4.20–5.80)
RDW: 13.5 % (ref 11.0–15.0)
WBC: 4.4 10*3/uL (ref 3.8–10.8)

## 2016-02-17 NOTE — Addendum Note (Signed)
Addended by: Eulis Foster on: 02/17/2016 10:10 AM   Modules accepted: Orders

## 2016-02-21 ENCOUNTER — Encounter (HOSPITAL_COMMUNITY): Payer: Self-pay | Admitting: *Deleted

## 2016-02-21 ENCOUNTER — Encounter (HOSPITAL_COMMUNITY)
Admission: RE | Disposition: A | Discharge status: Home / Self Care | Payer: Self-pay | Source: Ambulatory Visit | Attending: Internal Medicine

## 2016-02-21 ENCOUNTER — Ambulatory Visit (HOSPITAL_COMMUNITY)
Admission: RE | Admit: 2016-02-21 | Discharge: 2016-02-21 | Disposition: A | Discharge status: Home / Self Care | Payer: Medicare Other | Source: Ambulatory Visit | Attending: Internal Medicine | Admitting: Internal Medicine

## 2016-02-21 DIAGNOSIS — Z79899 Other long term (current) drug therapy: Secondary | ICD-10-CM | POA: Insufficient documentation

## 2016-02-21 DIAGNOSIS — I4901 Ventricular fibrillation: Secondary | ICD-10-CM | POA: Diagnosis not present

## 2016-02-21 DIAGNOSIS — Z8674 Personal history of sudden cardiac arrest: Secondary | ICD-10-CM | POA: Insufficient documentation

## 2016-02-21 DIAGNOSIS — Z7984 Long term (current) use of oral hypoglycemic drugs: Secondary | ICD-10-CM | POA: Insufficient documentation

## 2016-02-21 DIAGNOSIS — Z791 Long term (current) use of non-steroidal anti-inflammatories (NSAID): Secondary | ICD-10-CM | POA: Insufficient documentation

## 2016-02-21 DIAGNOSIS — G40909 Epilepsy, unspecified, not intractable, without status epilepticus: Secondary | ICD-10-CM | POA: Diagnosis not present

## 2016-02-21 DIAGNOSIS — Z8673 Personal history of transient ischemic attack (TIA), and cerebral infarction without residual deficits: Secondary | ICD-10-CM | POA: Insufficient documentation

## 2016-02-21 DIAGNOSIS — Z87891 Personal history of nicotine dependence: Secondary | ICD-10-CM | POA: Diagnosis not present

## 2016-02-21 DIAGNOSIS — I10 Essential (primary) hypertension: Secondary | ICD-10-CM | POA: Diagnosis not present

## 2016-02-21 DIAGNOSIS — Z4502 Encounter for adjustment and management of automatic implantable cardiac defibrillator: Secondary | ICD-10-CM

## 2016-02-21 DIAGNOSIS — I2510 Atherosclerotic heart disease of native coronary artery without angina pectoris: Secondary | ICD-10-CM | POA: Insufficient documentation

## 2016-02-21 DIAGNOSIS — I498 Other specified cardiac arrhythmias: Secondary | ICD-10-CM | POA: Insufficient documentation

## 2016-02-21 DIAGNOSIS — Z006 Encounter for examination for normal comparison and control in clinical research program: Secondary | ICD-10-CM | POA: Diagnosis not present

## 2016-02-21 DIAGNOSIS — E785 Hyperlipidemia, unspecified: Secondary | ICD-10-CM | POA: Diagnosis not present

## 2016-02-21 DIAGNOSIS — Z95810 Presence of automatic (implantable) cardiac defibrillator: Secondary | ICD-10-CM | POA: Diagnosis present

## 2016-02-21 DIAGNOSIS — J449 Chronic obstructive pulmonary disease, unspecified: Secondary | ICD-10-CM | POA: Insufficient documentation

## 2016-02-21 DIAGNOSIS — Z7901 Long term (current) use of anticoagulants: Secondary | ICD-10-CM | POA: Diagnosis not present

## 2016-02-21 DIAGNOSIS — E119 Type 2 diabetes mellitus without complications: Secondary | ICD-10-CM | POA: Diagnosis not present

## 2016-02-21 DIAGNOSIS — I480 Paroxysmal atrial fibrillation: Secondary | ICD-10-CM | POA: Diagnosis not present

## 2016-02-21 HISTORY — DX: Type 2 diabetes mellitus without complications: E11.9

## 2016-02-21 HISTORY — PX: EP IMPLANTABLE DEVICE: SHX172B

## 2016-02-21 LAB — GLUCOSE, CAPILLARY: Glucose-Capillary: 145 mg/dL — ABNORMAL HIGH (ref 65–99)

## 2016-02-21 LAB — SURGICAL PCR SCREEN
MRSA, PCR: NEGATIVE
STAPHYLOCOCCUS AUREUS: NEGATIVE

## 2016-02-21 SURGERY — ICD GENERATOR CHANGEOUT

## 2016-02-21 MED ORDER — FENTANYL CITRATE (PF) 100 MCG/2ML IJ SOLN
INTRAMUSCULAR | Status: DC | PRN
  Administered 2016-02-21: 12.5 ug via INTRAVENOUS
  Administered 2016-02-21: 25 ug via INTRAVENOUS

## 2016-02-21 MED ORDER — ONDANSETRON HCL 4 MG/2ML IJ SOLN: 4.0000 mg | Freq: Four times a day (QID) | INTRAMUSCULAR | Status: DC | PRN

## 2016-02-21 MED ORDER — SODIUM CHLORIDE 0.9 % IR SOLN
Status: AC
  Filled 2016-02-21: qty 2

## 2016-02-21 MED ORDER — HYDRALAZINE HCL 20 MG/ML IJ SOLN
10.0000 mg | Freq: Once | INTRAMUSCULAR | Status: AC
Start: 2016-02-21 — End: 2016-02-21
  Administered 2016-02-21: 10 mg via INTRAVENOUS

## 2016-02-21 MED ORDER — ACETAMINOPHEN 325 MG PO TABS: 325.0000 mg | ORAL_TABLET | ORAL | Status: DC | PRN

## 2016-02-21 MED ORDER — MIDAZOLAM HCL 5 MG/5ML IJ SOLN
INTRAMUSCULAR | Status: DC | PRN
  Administered 2016-02-21: 1 mg via INTRAVENOUS
  Administered 2016-02-21: 2 mg via INTRAVENOUS

## 2016-02-21 MED ORDER — SODIUM CHLORIDE 0.9 % IR SOLN
80.0000 mg | Status: AC
  Administered 2016-02-21: 80 mg
  Filled 2016-02-21: qty 2

## 2016-02-21 MED ORDER — CEFAZOLIN SODIUM-DEXTROSE 2-4 GM/100ML-% IV SOLN
INTRAVENOUS | Status: AC
  Filled 2016-02-21: qty 100

## 2016-02-21 MED ORDER — CEFAZOLIN SODIUM-DEXTROSE 2-4 GM/100ML-% IV SOLN
2.0000 g | INTRAVENOUS | Status: AC
  Administered 2016-02-21: 2 g via INTRAVENOUS
  Filled 2016-02-21: qty 100

## 2016-02-21 MED ORDER — SODIUM CHLORIDE 0.9 % IV SOLN
INTRAVENOUS | Status: DC
  Administered 2016-02-21: 07:00:00 via INTRAVENOUS

## 2016-02-21 MED ORDER — CHLORHEXIDINE GLUCONATE 4 % EX LIQD
60.0000 mL | Freq: Once | CUTANEOUS | Status: DC
  Filled 2016-02-21: qty 60

## 2016-02-21 MED ORDER — MUPIROCIN 2 % EX OINT
TOPICAL_OINTMENT | Freq: Once | CUTANEOUS | Status: AC
  Administered 2016-02-21: 1 via NASAL
  Filled 2016-02-21: qty 22

## 2016-02-21 MED ORDER — MUPIROCIN 2 % EX OINT
TOPICAL_OINTMENT | CUTANEOUS | Status: AC
  Filled 2016-02-21: qty 22

## 2016-02-21 MED ORDER — HYDRALAZINE HCL 20 MG/ML IJ SOLN
INTRAMUSCULAR | Status: AC
  Administered 2016-02-21: 10 mg via INTRAVENOUS
  Filled 2016-02-21: qty 1

## 2016-02-21 MED ORDER — MIDAZOLAM HCL 5 MG/5ML IJ SOLN
INTRAMUSCULAR | Status: AC
  Filled 2016-02-21: qty 5

## 2016-02-21 MED ORDER — LIDOCAINE HCL (PF) 1 % IJ SOLN
INTRAMUSCULAR | Status: DC | PRN
  Administered 2016-02-21: 50 mL via INTRADERMAL

## 2016-02-21 MED ORDER — LOSARTAN POTASSIUM 50 MG PO TABS
50.0000 mg | ORAL_TABLET | Freq: Once | ORAL | Status: AC
  Administered 2016-02-21: 50 mg via ORAL
  Filled 2016-02-21: qty 1

## 2016-02-21 MED ORDER — FENTANYL CITRATE (PF) 100 MCG/2ML IJ SOLN
INTRAMUSCULAR | Status: AC
  Filled 2016-02-21: qty 2

## 2016-02-21 SURGICAL SUPPLY — 4 items
CABLE SURGICAL S-101-97-12 (CABLE) ×2 IMPLANT
ICD ELLIPSE VR CD1411-36C (ICD Generator) ×2 IMPLANT
PAD DEFIB LIFELINK (PAD) ×2 IMPLANT
TRAY PACEMAKER INSERTION (PACKS) ×2 IMPLANT

## 2016-02-21 NOTE — Discharge Instructions (Signed)
Pacemaker Battery Change, Care After Refer to this sheet in the next few weeks. These instructions provide you with information on caring for yourself after your procedure. Your health care provider may also give you more specific instructions. Your treatment has been planned according to current medical practices, but problems sometimes occur. Call your health care provider if you have any problems or questions after your procedure. WHAT TO EXPECT AFTER THE PROCEDURE After your procedure, it is typical to have the following sensations:  Soreness at the pacemaker site. HOME CARE INSTRUCTIONS   Keep the incision clean and dry      YOU MAY REMOVE OUTER DRESSING TOMORROW ; DO NOT GET SITE WET FOR 10DAYS; LEAVE STERI-STRIPS ON  For the first week after the replacement, avoid stretching motions that pull at the incision site, and avoid heavy exercise with the arm that is on the same side as the incision.  Take medicines only as directed by your health care provider.  Keep all follow-up visits as directed by your health care provider. SEEK MEDICAL CARE IF:   You have pain at the incision site that is not relieved by over-the-counter or prescription medicine.  There is drainage or pus from the incision site.  There is swelling larger than a lime at the incision site.  You develop red streaking that extends above or below the incision site.  You feel brief, intermittent palpitations, light-headedness, or any symptoms that you feel might be related to your heart. SEEK IMMEDIATE MEDICAL CARE IF:   You experience chest pain that is different than the pain at the pacemaker site.  You experience shortness of breath.  You have palpitations or irregular heartbeat.  You have light-headedness that does not go away quickly.  You faint.  You have pain that gets worse and is not relieved by medicine. This information is not intended to replace advice given to you by your health care provider.  Make sure you discuss any questions you have with your health care provider. Document Released: 12/28/2012 Document Revised: 03/30/2014 Document Reviewed: 12/28/2012 Elsevier Interactive Patient Education  2017 [employer].

## 2016-02-21 NOTE — H&P (Signed)
ICD Criteria  Current LVEF:50%. Within 12 months prior to implant: No   Heart failure history: No  Cardiomyopathy history: No.  Atrial Fibrillation/Atrial Flutter: Yes, Paroxysmal.  Ventricular tachycardia history: No.  Cardiac arrest history: Yes, Ventricular Fibrillation.  History of syndromes with risk of sudden death: Yes, Brugada Syndrome  Previous ICD: Yes, Reason for ICD:  Secondary prevention.  Current ICD indication: Secondary  PPM indication: No.   Class I or II Bradycardia indication present: No  Beta Blocker therapy for 3 or more months: Yes, prescribed.   Ace Inhibitor/ARB therapy for 3 or more months: Yes, prescribed.

## 2016-02-21 NOTE — H&P (Signed)
Cardiology Office Note Date:  02/03/2016  Patient ID:  Brad Brown, Brad Brown Dec 01, 1945, MRN [id number] PCP:  Webb Silversmith, NP     Electrophysiologist:  Dr. Lovena Le   Chief Complaint: called in to discuss gen cange  History of Present Illness: Brad Brown is a 70 y.o. male with history of Brugada syndrome, VF,  w/ICD, COPD, CAD (cath 1987 noted below), HLD, DM, PAFib on a/c, seizure d/o, arthritis, comes to the office today noting his ICD has reached ERI by a remote check.  He is being seen for Dr. Lovena Le, lat seen by him in May, doing well at that time.  He reports feeling very well, no CP, palpitations or SOB, no exertional intolerances, no shocks.  He denies any unusual bleeding with his Xarelto, will infrequently have very minor hemorrhoidal bleeding only, unchanged for years.   DEVICE information:  SJM single chamber ICD, implanted 08/17/06, Dr. Lovena Le The patient reports 3 shocks over the life of his device, the last about 2 years ago, all associated with nausea/vomiting episodes      Past Medical History:  Diagnosis Date  . Arthritis   . Atrial fibrillation ([place])   . Automatic implantable cardiac defibrillator in situ   . Borderline diabetes mellitus   . CAD (coronary artery disease)   . Colonic polyp 2004   hyperplastic   . COPD (chronic obstructive pulmonary disease) ([place])   . GERD (gastroesophageal reflux disease)   . History of renal insufficiency syndrome   . HTN (hypertension)   . Hyperlipidemia   . Monoclonal gammopathy   . 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 Seizure disorder ([place])   . Stroke [facility])          Past Surgical History:  Procedure Laterality Date  . CARDIAC CATHETERIZATION  1987   Showed distal left circumflex 100% occluded   . CARDIAC DEFIBRILLATOR PLACEMENT  08/17/2006   Implantation of a [employer] single chamber defibrillator  . INGUINAL HERNIA REPAIR Left           Current Outpatient  Prescriptions  Medication Sig Dispense Refill  . ciclopirox (LOPROX) 0.77 % cream Apply topically 2 (two) times daily. 15 g 0  . clotrimazole-betamethasone (LOTRISONE) cream Apply to groin rash 2 times daily 30 g 1  . doxycycline (VIBRAMYCIN) 50 MG capsule Take 1 capsule (50 mg total) by mouth 2 (two) times daily. 20 capsule 0  . fluticasone (FLOVENT HFA) 110 MCG/ACT inhaler Inhale 1 puff into the lungs 2 (two) times daily. 1 Inhaler 12  . glipiZIDE (GLUCOTROL) 5 MG tablet TAKE 1 TABLET BY MOUTH TWICE A DAY BEFORE A MEAL 180 tablet 1  . HYDROcodone-homatropine (HYCODAN) 5-1.5 MG/5ML syrup Take 5 mLs by mouth every 8 (eight) hours as needed for cough. 120 mL 0  . levETIRAcetam (KEPPRA) 500 MG tablet Take one tablet by mouth in the morning and two tablets by mouth at night    . losartan (COZAAR) 25 MG tablet TAKE 1 TABLET (25 MG TOTAL) BY MOUTH DAILY. 90 tablet 3  . meloxicam (MOBIC) 7.5 MG tablet TAKE 1 TABLET (7.5 MG TOTAL) BY MOUTH DAILY. 90 tablet 1  . methylPREDNISolone (MEDROL) 8 MG tablet Take as directed 16 tablet 0  . metoprolol tartrate (LOPRESSOR) 25 MG tablet Take 12.5 mg by mouth 2 (two) times daily.    . Multiple Vitamin (MULTIVITAMIN) tablet Take 1 tablet by mouth daily.      . naftifine (NAFTIN) 1 % cream Apply 1 application  topically 2 (two) times daily as needed (For  infection between toes).     . Niacin, Antihyperlipidemic, 500 MG TABS Take 500 mg by mouth daily. 30 tablet 2  . Omega-3 Fatty Acids (FISH OIL) 1200 MG CAPS Take 1 capsule by mouth daily.    . Rivaroxaban (XARELTO) 20 MG TABS tablet Take 20 mg by mouth daily with supper.    . simvastatin (ZOCOR) 20 MG tablet Take one half tablet by mouth daily at bedtime     . tamsulosin (FLOMAX) 0.4 MG CAPS capsule Take 0.4 mg by mouth daily.  5   No current facility-administered medications for this visit.     Allergies:   Lipitor [atorvastatin]   Social History:  The patient  reports that he quit smoking  about 11 years ago. His smoking use included Cigarettes. He has a 7.50 pack-year smoking history. He has never used smokeless tobacco. He reports that he drinks about 0.6 oz of alcohol per week . He reports that he does not use drugs.   Family History:  The patient's family history includes Colon cancer in his brother; Heart attack in his brother and mother.  ROS:  Please see the history of present illness.  All other systems are reviewed and otherwise negative.   PHYSICAL EXAM:  VS:  BP 132/90   Pulse (!) 53   Ht 5\' 9"  (1.753 m)   Wt 198 lb 9.6 oz (90.1 kg)   BMI 29.33 kg/m  BMI: Body mass index is 29.33 kg/m. Well nourished, well developed, in no acute distress  HEENT: normocephalic, atraumatic  Neck: no JVD, carotid bruits or masses Cardiac:  RRR; no significant murmurs, no rubs, or gallops Lungs:  clear to auscultation bilaterally, no wheezing, rhonchi or rales  Abd: soft, nontender MS: no deformity or atrophy Ext: no edema  Skin: warm and dry, no rash Neuro:  No gross deficits appreciated Psych: euthymic mood, full affect  ICD site is stable, no tethering or discomfort   EKG:  Done today and reviewed by myself shows SB, 1st degree AVBlock,  ICD interrogation today and reviewed by myself: battery reached ERI 01/26/16, lead status is stable, no VT/VF, >99% V sensed  05/03/06: Stress myoview Abnormal with small inferolateral infarct, no ischemia.  Normal LV size and function.  Comparison with prior study of 7/27/197, no significant interval change  11/21/03: TTE SUMMARY - Overall left ventricular systolic function was preserved. Left    ventricular ejection fraction was estimated , range being 50    % to 55 %.. There appeared to be akinesis of the inferobasal    wall. - Aortic valve thickness was mildly increased. There was trivial    aortic valvular regurgitation. - There was mild atheroma of the descending aorta. - The left atrium was  mildly dilated. There was no left atrial    appendage thrombus identified. There was mild continuous    spontaneous echo contrast ("smoke") in the left atrial    appendage c/w low flow state. 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 - No IAFC by color doppler. No intracardiac shunt was detected by    contrast study with agitated saline.   Recent Labs: 01/30/2016: ALT 28; BUN 15.0; Creatinine 1.4; HGB 14.1; Platelets 163; Potassium 4.4; Sodium 140  02/28/2015: Cholesterol 128; Direct LDL 30.0; HDL 37.70; Total CHOL/HDL Ratio 3; Triglycerides 220.0; VLDL 44.0   Estimated Creatinine Clearance (by  C-G formula based on SCr of 1.4 mg/dL (H)) Male: 46 mL/min Male: 56.1 mL/min      Wt Readings from Last 3 Encounters:  02/03/16 198 lb 9.6 oz (90.1 kg)  08/22/15 202 lb 8 oz (91.9 kg)  08/09/15 202 lb 9.6 oz (91.9 kg)     Other studies reviewed: Additional studies/records reviewed today include: summarized above  ASSESSMENT AND PLAN:  1. Brugada syndrome (notes mention hx VF)     SJM ICD is at [facility]     discussed risks/benefits of generator change, he would like to proceed  2. CAD     stable, no symptoms     On BB, statin, and niacin reports labs monitored and managed withhis PMD  3. HTN     stable  4. PAFib     CHA2DS2Vasc is at least 4, on Xarelto EP Attending  Patient seen and examined. Agree with the findings above. Since his last visit, no change. He has Brugada syndrome, and is s/p ICD, and now at [facility]. We will plan to proceed with ICD generator change out.  [name].D.

## 2016-02-21 NOTE — Progress Notes (Signed)
Dr Lovena Le notified of B/P and ok to d/c home

## 2016-02-24 ENCOUNTER — Encounter (HOSPITAL_COMMUNITY): Payer: Self-pay | Admitting: Internal Medicine

## 2016-02-25 ENCOUNTER — Telehealth: Payer: Self-pay | Admitting: Internal Medicine

## 2016-02-25 NOTE — Telephone Encounter (Signed)
LMOVM for pt to return call 

## 2016-02-25 NOTE — Telephone Encounter (Signed)
New messsage  Pt is calling/needs a letter for his insurance co stating that  He needs a battery change in his monitor  Pt states that he has changed the battery, this letter is so he can get a refund

## 2016-02-25 NOTE — Telephone Encounter (Signed)
error 

## 2016-02-26 ENCOUNTER — Encounter (HOSPITAL_COMMUNITY): Payer: Self-pay | Admitting: Internal Medicine

## 2016-02-26 NOTE — Telephone Encounter (Signed)
Pt was calling in regards to a prior authorization for his generator change. Pt stated that he believes he has it figured out and will call back later if he has not figured it out.

## 2016-02-28 ENCOUNTER — Telehealth: Payer: Self-pay | Admitting: Cardiology

## 2016-02-28 NOTE — Telephone Encounter (Signed)
Follow Up:   Pt calling about the letter he received.

## 2016-03-02 NOTE — Telephone Encounter (Signed)
LMOVM informing pt that he can disregard letter he received.

## 2016-03-03 ENCOUNTER — Ambulatory Visit (INDEPENDENT_AMBULATORY_CARE_PROVIDER_SITE_OTHER): Payer: Medicare Other | Admitting: *Deleted

## 2016-03-03 DIAGNOSIS — Z95810 Presence of automatic (implantable) cardiac defibrillator: Secondary | ICD-10-CM | POA: Diagnosis not present

## 2016-03-03 NOTE — Progress Notes (Signed)
Wound check appointment. Steri-strips removed. Wound without redness or edema. Incision edges approximated, wound well healed. Normal device function. Thresholds, sensing, and impedances consistent with implant measurements. Device programmed at chronic outputs. Histogram distribution appropriate for patient and level of activity. No mode switches or ventricular arrhythmias noted. Patient educated about wound care, shock plan. ROV in 05/22/2016 with GT

## 2016-03-04 ENCOUNTER — Other Ambulatory Visit: Payer: Self-pay | Admitting: Internal Medicine

## 2016-03-04 NOTE — Progress Notes (Signed)
Pre visit review using our clinic review tool, if applicable. No additional management support is needed unless otherwise documented below in the visit note. 

## 2016-04-07 ENCOUNTER — Encounter: Payer: Self-pay | Admitting: Internal Medicine

## 2016-04-07 ENCOUNTER — Ambulatory Visit (INDEPENDENT_AMBULATORY_CARE_PROVIDER_SITE_OTHER): Payer: Medicare Other | Admitting: Internal Medicine

## 2016-04-07 VITALS — BP 138/88 | HR 59 | Temp 97.5°F | Ht 68.5 in | Wt 195.0 lb

## 2016-04-07 DIAGNOSIS — I6350 Cerebral infarction due to unspecified occlusion or stenosis of unspecified cerebral artery: Secondary | ICD-10-CM

## 2016-04-07 DIAGNOSIS — E7800 Pure hypercholesterolemia, unspecified: Secondary | ICD-10-CM | POA: Diagnosis not present

## 2016-04-07 DIAGNOSIS — Z0000 Encounter for general adult medical examination without abnormal findings: Secondary | ICD-10-CM

## 2016-04-07 DIAGNOSIS — I4891 Unspecified atrial fibrillation: Secondary | ICD-10-CM

## 2016-04-07 DIAGNOSIS — I10 Essential (primary) hypertension: Secondary | ICD-10-CM

## 2016-04-07 DIAGNOSIS — Z1159 Encounter for screening for other viral diseases: Secondary | ICD-10-CM | POA: Diagnosis not present

## 2016-04-07 DIAGNOSIS — Z114 Encounter for screening for human immunodeficiency virus [HIV]: Secondary | ICD-10-CM | POA: Diagnosis not present

## 2016-04-07 DIAGNOSIS — R350 Frequency of micturition: Secondary | ICD-10-CM

## 2016-04-07 DIAGNOSIS — M1990 Unspecified osteoarthritis, unspecified site: Secondary | ICD-10-CM

## 2016-04-07 DIAGNOSIS — I2510 Atherosclerotic heart disease of native coronary artery without angina pectoris: Secondary | ICD-10-CM

## 2016-04-07 DIAGNOSIS — N401 Enlarged prostate with lower urinary tract symptoms: Secondary | ICD-10-CM

## 2016-04-07 DIAGNOSIS — B353 Tinea pedis: Secondary | ICD-10-CM

## 2016-04-07 DIAGNOSIS — R569 Unspecified convulsions: Secondary | ICD-10-CM

## 2016-04-07 DIAGNOSIS — K219 Gastro-esophageal reflux disease without esophagitis: Secondary | ICD-10-CM

## 2016-04-07 DIAGNOSIS — E119 Type 2 diabetes mellitus without complications: Secondary | ICD-10-CM | POA: Diagnosis not present

## 2016-04-07 DIAGNOSIS — J411 Mucopurulent chronic bronchitis: Secondary | ICD-10-CM

## 2016-04-07 LAB — COMPREHENSIVE METABOLIC PANEL
ALBUMIN: 4.5 g/dL (ref 3.5–5.2)
ALK PHOS: 50 U/L (ref 39–117)
ALT: 23 U/L (ref 0–53)
AST: 18 U/L (ref 0–37)
BUN: 15 mg/dL (ref 6–23)
CHLORIDE: 103 meq/L (ref 96–112)
CO2: 29 mEq/L (ref 19–32)
Calcium: 9.6 mg/dL (ref 8.4–10.5)
Creatinine, Ser: 1.29 mg/dL (ref 0.40–1.50)
GFR: 71.18 mL/min (ref >60.00)
Glucose, Bld: 55 mg/dL — ABNORMAL LOW (ref 70–99)
POTASSIUM: 4.3 meq/L (ref 3.5–5.1)
SODIUM: 139 meq/L (ref 135–145)
Total Bilirubin: 1.1 mg/dL (ref 0.2–1.2)
Total Protein: 7.4 g/dL (ref 6.0–8.3)

## 2016-04-07 LAB — CBC
HEMATOCRIT: 43.4 % (ref 39.0–52.0)
HEMOGLOBIN: 14.6 g/dL (ref 13.0–17.0)
MCHC: 33.6 g/dL (ref 30.0–36.0)
MCV: 90.6 fl (ref 78.0–100.0)
Platelets: 201 10*3/uL (ref 150.0–400.0)
RBC: 4.79 Mil/uL (ref 4.22–5.81)
RDW: 13.4 % (ref 11.5–15.5)
WBC: 7.4 10*3/uL (ref 4.0–10.5)

## 2016-04-07 LAB — HEMOGLOBIN A1C: Hgb A1c MFr Bld: 6.5 % (ref 4.6–6.5)

## 2016-04-07 LAB — LDL CHOLESTEROL, DIRECT: Direct LDL: 33 mg/dL

## 2016-04-07 LAB — LIPID PANEL
CHOL/HDL RATIO: 3
CHOLESTEROL: 140 mg/dL (ref 0–200)
HDL: 42.6 mg/dL (ref >39.00)
NonHDL: 97.02
TRIGLYCERIDES: 217 mg/dL — AB (ref 0.0–149.0)
VLDL: 43.4 mg/dL — AB (ref 0.0–40.0)

## 2016-04-07 LAB — PSA, MEDICARE: PSA: 0.79 ng/mL (ref 0.10–4.00)

## 2016-04-07 MED ORDER — CICLOPIROX OLAMINE 0.77 % EX CREA: TOPICAL_CREAM | Freq: Two times a day (BID) | CUTANEOUS | 0 refills | Status: DC

## 2016-04-07 MED ORDER — LOSARTAN POTASSIUM 50 MG PO TABS: 50.0000 mg | ORAL_TABLET | Freq: Every day | ORAL | 0 refills | Status: DC

## 2016-04-07 NOTE — Progress Notes (Signed)
HPI:  Pt presents to the clinic today for his Medicare Wellness Exam. He is also due for follow up of chronic conditions.  Arthritis: Mainly in his hands. He reports increased stiffess with the cold weather. He  Does not take anything OTC for this.   Afib: He denies chest pain or feeling like his heart is racing. He is taking Metoprolol and Xarelto as prescribed. He follows with Dr. Lovena Le.  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 He does not check his sugars. He is taking Glipizide as prescribed. He does not check his sugars. His last eye exam was 06/2015. He does not check his feet daily. His flu and pneumonia vaccines are UTD.  HLD with CAD: His last LDL was 30. He is taking Zocor, Niacin and Fish Oil as prescribed. He does try to eat a low fat diet.  COPD: He has mild shortness of breath with exertion at times, but no chest pain. He is taking Flovent daily as prescribed. He does not smoke.  GERD: He reports this is triggered by spicy and tomato based foods. He just tries to avoid these. He does not take anything additional OTC for this.   HTN: His BP today is 138/88. He is taking Losartan daily.ECG from 02/2016 reviewed.  BPH: He reports urinary frequency and nocturia. He is taking Flomax but is not sure that it helps.   Seizure Disorder: He has not had an recent seizures on Keppra.  History of Stroke: He does have some residual memory issues but no unilateral weakness. He takes Losartan, Metoprolol, Xarelto and Zocor as prescribed.    Past Medical History:  Diagnosis Date  . Arthritis   . Atrial fibrillation ([place])   . Automatic implantable cardiac defibrillator in situ   . Borderline diabetes mellitus   . CAD (coronary artery disease)   . Colonic polyp 2004   hyperplastic   . COPD (chronic obstructive pulmonary disease) ([place])   . Diabetes mellitus without complication ([place])   . GERD (gastroesophageal reflux disease)   . History of renal insufficiency syndrome   . HTN  (hypertension)   . Hyperlipidemia   . Monoclonal gammopathy   . 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 Seizure disorder ([place])   . Stroke [facility])     Current Outpatient Prescriptions  Medication Sig Dispense Refill  . acetaminophen (TYLENOL) 500 MG tablet Take 1,000 mg by mouth every 6 (six) hours as needed (for pain/headache.).    Marland Kitchen ciclopirox (LOPROX) 0.77 % cream Apply topically 2 (two) times daily. (Patient taking differently: Apply 1 application topically 2 (two) times daily as needed (for rash/itchy skin). ) 15 g 0  . fluticasone (FLOVENT HFA) 110 MCG/ACT inhaler Inhale 1 puff into the lungs 2 (two) times daily. 1 Inhaler 12  . glipiZIDE (GLUCOTROL) 5 MG tablet Take 1 tablet (5 mg total) by mouth 2 (two) times daily before a meal. MUST SCHEDULE ANNUAL EXAM 180 tablet 0  . levETIRAcetam (KEPPRA) 500 MG tablet Take 500-1,000 mg by mouth 2 (two) times daily. Take one tablet by mouth in the morning and two tablets by mouth at night    . losartan (COZAAR) 25 MG tablet TAKE 1 TABLET (25 MG TOTAL) BY MOUTH DAILY. 90 tablet 3  . meloxicam (MOBIC) 7.5 MG tablet TAKE 1 TABLET (7.5 MG TOTAL) BY MOUTH DAILY. 90 tablet 1  . metoprolol tartrate (LOPRESSOR) 25 MG tablet Take 12.5 mg by mouth 2 (two) times daily.    Marland Kitchen  Multiple Vitamin (MULTIVITAMIN) tablet Take 1 tablet by mouth daily.      . naftifine (NAFTIN) 1 % cream Apply 1 application topically 2 (two) times daily as needed (For  infection between toes).     . Niacin, Antihyperlipidemic, 500 MG TABS Take 500 mg by mouth daily. 30 tablet 2  . Omega-3 Fatty Acids (FISH OIL) 1200 MG CAPS Take 1,200 mg by mouth daily.     . Rivaroxaban (XARELTO) 20 MG TABS tablet Take 20 mg by mouth daily at 2 PM.     . simvastatin (ZOCOR) 20 MG tablet Take 10 mg by mouth at bedtime.     . tamsulosin (FLOMAX) 0.4 MG CAPS capsule Take 0.4 mg by mouth daily.  5   No current facility-administered medications for this visit.     Allergies  Allergen  Reactions  . Lipitor [Atorvastatin] Rash    Family History  Problem Relation Age of Onset  . Colon cancer Brother   . Heart attack Mother   . Heart attack Brother     Social History   Social History  . Marital status: Single    Spouse name: N/A  . Number of children: 1  . Years of education: N/A   Occupational History  . retired Retired   Social History Main Topics  . Smoking status: Former Smoker    Packs/day: 0.50    Years: 15.00    Types: Cigarettes    Quit date: 03/23/2004  . Smokeless tobacco: Never Used  . Alcohol use 0.6 oz/week    1 Glasses of wine per week     Comment: rare--wine  . Drug use: No  . Sexual activity: Not on file   Other Topics Concern  . Not on file   Social History Narrative   ICD-St. Jude  Remote-Yes    Hospitiliaztions: None  Health Maintenance:    Flu: 12/2015  Tetanus: 08/2013  Pneumovax: 08/2013  Prevnar: 12/2015  Zostavax: never  PSA: 2016  Colon Screening: 07/2013  Eye Doctor: 06/2015  Dental Exam: annually   Providers:   PCP: Webb Silversmith, NP-C  Cardiologist: Dr. Lovena Le    I have personally reviewed and have noted:  1. The patient's medical and social history 2. Their use of alcohol, tobacco or illicit drugs 3. Their current medications and supplements 4. The patient's functional ability including ADL's, fall risks, home safety risks and hearing or visual impairment. 5. Diet and physical activities 6. Evidence for depression or mood disorder  Subjective:   Review of Systems:   Constitutional: Denies fever, malaise, fatigue, headache or abrupt weight changes.  HEENT: Denies eye pain, eye redness, ear pain, ringing in the ears, wax buildup, runny nose, nasal congestion, bloody nose, or sore throat. Respiratory: Denies difficulty breathing, shortness of breath, cough or sputum production.   Cardiovascular: Denies chest pain, chest tightness, palpitations or swelling in the hands or feet.  Gastrointestinal: Pt  reports intermittent reflux. Denies abdominal pain, bloating, constipation, diarrhea or blood in the stool.  GU: Pt reports urinary frequency. Denies urgency, pain with urination, burning sensation, blood in urine, odor or discharge. Musculoskeletal: Denies decrease in range of motion, difficulty with gait, muscle pain or joint pain and swelling.  Skin: Denies redness, rashes, lesions or ulcercations.  Neurological: Pt reports difficulty with memory. Denies dizziness, difficulty with speech or problems with balance and coordination.  Psych: Denies anxiety, depression, SI/HI.  No other specific complaints in a complete review of systems (except as listed in HPI above).  Objective:  PE:   BP 138/88   Pulse (!) 59   Temp 97.5 F (36.4 C) (Oral)   Ht 5' 8.5" (1.74 m)   Wt 195 lb (88.5 kg)   SpO2 98%   BMI 29.22 kg/m   Wt Readings from Last 3 Encounters:  02/21/16 200 lb (90.7 kg)  02/06/16 200 lb 3.2 oz (90.8 kg)  02/03/16 198 lb 9.6 oz (90.1 kg)    General: Appears his stated age, well developed, well nourished in NAD. Skin: Warm, dry and intact. No  ulcerations noted. Cardiovascular: Normal rate and rhythm. S1,S2 noted.  No murmur, rubs or gallops noted. No JVD or BLE edema. No carotid bruits noted. Pulmonary/Chest: Normal effort and positive vesicular breath sounds. No respiratory distress. No wheezes, rales or ronchi noted.  Abdomen: Soft and nontender. Normal bowel sounds. No distention or masses noted. Neurological: Alert and oriented.  Psychiatric: Mood and affect normal. Behavior is normal. Judgment and thought content normal.     BMET    Component Value Date/Time   NA 137 02/17/2016 1011   NA 140 01/30/2016 1129   K 4.1 02/17/2016 1011   K 4.4 01/30/2016 1129   CL 104 02/17/2016 1011   CL 104 01/06/2012 1355   CO2 26 02/17/2016 1011   CO2 26 01/30/2016 1129   GLUCOSE 123 (H) 02/17/2016 1011   GLUCOSE 191 (H) 01/30/2016 1129   GLUCOSE 77 01/06/2012 1355   BUN  14 02/17/2016 1011   BUN 15.0 01/30/2016 1129   CREATININE 1.29 (H) 02/17/2016 1011   CREATININE 1.4 (H) 01/30/2016 1129   CALCIUM 9.2 02/17/2016 1011   CALCIUM 9.4 01/30/2016 1129   GFRNONAA 74.56 02/03/2010 1110   GFRAA 57 02/08/2008 1040    Lipid Panel     Component Value Date/Time   CHOL 128 02/28/2015 1523   TRIG 220.0 (H) 02/28/2015 1523   HDL 37.70 (L) 02/28/2015 1523   CHOLHDL 3 02/28/2015 1523   VLDL 44.0 (H) 02/28/2015 1523   LDLCALC 57 06/14/2013 1235    CBC    Component Value Date/Time   WBC 4.4 02/17/2016 1011   RBC 4.65 02/17/2016 1011   HGB 14.2 02/17/2016 1011   HGB 14.1 01/30/2016 1129   HCT 41.5 02/17/2016 1011   HCT 41.5 01/30/2016 1129   PLT 176 02/17/2016 1011   PLT 163 01/30/2016 1129   MCV 89.2 02/17/2016 1011   MCV 89.8 01/30/2016 1129   MCH 30.5 02/17/2016 1011   MCHC 34.2 02/17/2016 1011   RDW 13.5 02/17/2016 1011   RDW 12.4 01/30/2016 1129   LYMPHSABS 1.2 01/30/2016 1129   MONOABS 0.2 01/30/2016 1129   EOSABS 0.1 01/30/2016 1129   BASOSABS 0.0 01/30/2016 1129    Hgb A1C Lab Results  Component Value Date   HGBA1C 6.9 (H) 02/28/2015      Assessment and Plan:   Medicare Annual Wellness Visit:  Diet: He does eat meat. He consumes fruits and veggies daily. He does eat some fried food. He drinks mostly water. Physical activity: He goes to the gym 2-3 days a week for at least 30 minutes. Depression/mood screen: Negative Hearing: Intact to whispered voice Visual acuity: Grossly normal, performs annual eye exam  ADLs: Capable Fall risk: None Home safety: Good Cognitive evaluation: He has trouble with recall. MMSE 20/30. EOL planning: Adv directives, full code/ I agree  Preventative Medicine: Flu, tetanus, prevnar, pneumovax UTD. He does not want shingles vaccine. PSA will be done with labs today. Colonoscopy UTD.  Encouraged him to consume a balanced diet and exercise regimen. Advised him to see an eye doctor and dentist annually.     Next appointment: 6 months follow up chronic conditoins.   Webb Silversmith, NP

## 2016-04-08 LAB — HEPATITIS C ANTIBODY: HCV AB: NEGATIVE

## 2016-04-09 ENCOUNTER — Encounter: Payer: Self-pay | Admitting: Internal Medicine

## 2016-04-09 DIAGNOSIS — N400 Enlarged prostate without lower urinary tract symptoms: Secondary | ICD-10-CM | POA: Insufficient documentation

## 2016-04-09 NOTE — Assessment & Plan Note (Signed)
CMET and lipid profile today Encouraged him to consume a low fat diet Continue Zocor, Niacin and Fish OIl

## 2016-04-09 NOTE — Assessment & Plan Note (Signed)
Continue Flovent daily

## 2016-04-09 NOTE — Assessment & Plan Note (Signed)
Too high Increase Losartan to 50 mg daily CMET today  RTC in 3 weeks to follow up HTN

## 2016-04-09 NOTE — Assessment & Plan Note (Signed)
Rythm and rate controlled Continue Metoprolol and Xarelto

## 2016-04-09 NOTE — Assessment & Plan Note (Signed)
Residual memory issues He is not interested in trying Aricept at this time We do need tighter BP control- increase Losartan to 50 mg daily Continue Metoprolol, Xarelto and Zocor

## 2016-04-09 NOTE — Patient Instructions (Signed)

## 2016-04-09 NOTE — Assessment & Plan Note (Signed)
A1C today No microalbumin secondary to ARB therapy Continue Glipizide for now Foot exam today Immunizations UTD Encouraged him to consume a low fat, low carb diet and exercise to lose weight

## 2016-04-09 NOTE — Assessment & Plan Note (Signed)
Continue to avoid triggers Can take Tums or Rolaids as needed

## 2016-04-09 NOTE — Assessment & Plan Note (Signed)
No chest pain CMET and lipid profile today Continue Zocor and Xarelto

## 2016-04-09 NOTE — Assessment & Plan Note (Signed)
-   Continue Flomax 

## 2016-04-09 NOTE — Assessment & Plan Note (Signed)
Continue to stay active Tylenol as needed

## 2016-04-09 NOTE — Assessment & Plan Note (Signed)
Will monitor No seizures on Keppra

## 2016-04-10 ENCOUNTER — Encounter: Payer: Self-pay | Admitting: Family Medicine

## 2016-04-10 ENCOUNTER — Ambulatory Visit (INDEPENDENT_AMBULATORY_CARE_PROVIDER_SITE_OTHER): Payer: Medicare Other | Admitting: Family Medicine

## 2016-04-10 VITALS — BP 172/90 | HR 88 | Temp 98.8°F | Resp 18 | Wt 199.4 lb

## 2016-04-10 DIAGNOSIS — B9789 Other viral agents as the cause of diseases classified elsewhere: Secondary | ICD-10-CM | POA: Diagnosis not present

## 2016-04-10 DIAGNOSIS — H00012 Hordeolum externum right lower eyelid: Secondary | ICD-10-CM

## 2016-04-10 DIAGNOSIS — J069 Acute upper respiratory infection, unspecified: Secondary | ICD-10-CM | POA: Diagnosis not present

## 2016-04-10 MED ORDER — BENZONATATE 100 MG PO CAPS: 100.0000 mg | ORAL_CAPSULE | Freq: Three times a day (TID) | ORAL | 0 refills | Status: DC | PRN

## 2016-04-10 NOTE — Progress Notes (Signed)
Pre visit review using our clinic review tool, if applicable. No additional management support is needed unless otherwise documented below in the visit note. 

## 2016-04-10 NOTE — Progress Notes (Signed)
Subjective:    Patient ID: Brad Brown, male    DOB: 01/23/1946, 71 y.o.   MRN: [id number]  HPI This is a 71 yo male who presents with cough x 2 days. Was seen 3 days ago for Medicare AWV, did not have symptoms at that time. Cough productive of "half dark and half light" phlegm. No runny nose, no sore throat, no ear pain, no headache. Feels congested in his chest. Hears wheezes. No SOB, no fever. Appetite normal. Taking plain mucinex with some relief. Has been to nursing home recently. Has not taken blood pressure medication yet today. States he takes regularly. Non smoker, no history of asthma or allergies.   Does not check blood sugars at home. Goes to gym 3x/week.   Past Medical History:  Diagnosis Date  . Arthritis   . Atrial fibrillation ([place])   . Automatic implantable cardiac defibrillator in situ   . Borderline diabetes mellitus   . CAD (coronary artery disease)   . Colonic polyp 2004   hyperplastic   . COPD (chronic obstructive pulmonary disease) ([place])   . Diabetes mellitus without complication ([place])   . GERD (gastroesophageal reflux disease)   . History of renal insufficiency syndrome   . HTN (hypertension)   . Hyperlipidemia   . Monoclonal gammopathy   . 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 Seizure disorder ([place])   . Stroke [facility])    Past Surgical History:  Procedure Laterality Date  . CARDIAC CATHETERIZATION  1987   Showed distal left circumflex 100% occluded   . CARDIAC DEFIBRILLATOR PLACEMENT  08/17/2006   Implantation of a St. Jude single chamber defibrillator  . EP IMPLANTABLE DEVICE N/A 02/21/2016   Procedure: ICD Generator Changeout;  Surgeon: Evans Lance, MD;  Location: [facility];  Service: Cardiovascular;  Laterality: N/A;  . INGUINAL HERNIA REPAIR Left    Family History  Problem Relation Age of Onset  . Colon cancer Brother   . Heart attack Mother   . Heart attack Brother    Social History  Substance Use Topics  .  Smoking status: Former Smoker    Packs/day: 0.50    Years: 15.00    Types: Cigarettes    Quit date: 03/23/2004  . Smokeless tobacco: Never Used  . Alcohol use 0.6 oz/week    1 Glasses of wine per week     Comment: rare--wine      Review of Systems Per HPI    Objective:   Physical Exam  Constitutional: He is oriented to person, place, and time. He appears well-developed and well-nourished. No distress.  HENT:  Head: Normocephalic and atraumatic.  Right Ear: Tympanic membrane, external ear and ear canal normal.  Left Ear: Tympanic membrane, external ear and ear canal normal.  Nose: Rhinorrhea present. Right sinus exhibits no maxillary sinus tenderness and no frontal sinus tenderness. Left sinus exhibits no maxillary sinus tenderness and no frontal sinus tenderness.  Mouth/Throat: Oropharynx is clear and moist.  Eyes: Conjunctivae are normal. Right eye exhibits hordeolum (lower lid).  Neck: Normal range of motion. Neck supple.  Cardiovascular: Normal rate, regular rhythm and normal heart sounds.   Pulmonary/Chest: Effort normal and breath sounds normal.  Lymphadenopathy:    He has no cervical adenopathy.  Neurological: He is alert and oriented to person, place, and time.  Skin: Skin is warm and dry. He is not diaphoretic.  Psychiatric: He has a normal mood and affect. His behavior is normal. Judgment and  thought content normal.  Vitals reviewed.     BP (!) 172/90 (BP Location: Left Arm, Patient Position: Sitting, Cuff Size: Normal)   Pulse 88   Temp 98.8 F (37.1 C) (Oral)   Resp 18   Wt 199 lb 6.4 oz (90.4 kg)   SpO2 98%   BMI 29.88 kg/m  Wt Readings from Last 3 Encounters:  04/10/16 199 lb 6.4 oz (90.4 kg)  04/07/16 195 lb (88.5 kg)  02/21/16 200 lb (90.7 kg)   BP Readings from Last 3 Encounters:  04/10/16 (!) 172/90  04/07/16 138/88  02/21/16 (!) 170/80        Assessment & Plan:  1. Viral upper respiratory tract infection with cough - recent onset, lung  sounds clear, lungs were clear at visit 3 days ago. Likely viral - Provided written and verbal information regarding diagnosis and treatment. - continue Mucinex, incrase fluids - RTC precautions reviewed - benzonatate (TESSALON) 100 MG capsule; Take 1 capsule (100 mg total) by mouth 3 (three) times daily as needed for cough.  Dispense: 30 capsule; Refill: 0  2. Hordeolum externum of right lower eyelid - warm compresses several times a day   Clarene Reamer, FNP-BC  [facility]  04/10/2016 9:20 PM

## 2016-04-10 NOTE — Patient Instructions (Signed)
Continue Mucinex Drink enough liquids to make your urine light yellow Get extra rest  Upper Respiratory Infection, Adult Most upper respiratory infections (URIs) are a viral infection of the air passages leading to the lungs. A URI affects the nose, throat, and upper air passages. The most common type of URI is nasopharyngitis and is typically referred to as "the common cold." URIs run their course and usually go away on their own. Most of the time, a URI does not require medical attention, but sometimes a bacterial infection in the upper airways can follow a viral infection. This is called a secondary infection. Sinus and middle ear infections are common types of secondary upper respiratory infections. Bacterial pneumonia can also complicate a URI. A URI can worsen asthma and chronic obstructive pulmonary disease (COPD). Sometimes, these complications can require emergency medical care and may be life threatening. What are the causes? Almost all URIs are caused by viruses. A virus is a type of germ and can spread from one person to another. What increases the risk? You may be at risk for a URI if:  You smoke.  You have chronic heart or lung disease.  You have a weakened defense (immune) system.  You are very young or very old.  You have nasal allergies or asthma.  You work in crowded or poorly ventilated areas.  You work in health care facilities or schools. What are the signs or symptoms? Symptoms typically develop 2-3 days after you come in contact with a cold virus. Most viral URIs last 7-10 days. However, viral URIs from the influenza virus (flu virus) can last 14-18 days and are typically more severe. Symptoms may include:  Runny or stuffy (congested) nose.  Sneezing.  Cough.  Sore throat.  Headache.  Fatigue.  Fever.  Loss of appetite.  Pain in your forehead, behind your eyes, and over your cheekbones (sinus pain).  Muscle aches. How is this diagnosed? Your  health care provider may diagnose a URI by:  Physical exam.  Tests to check that your symptoms are not due to another condition such as:  Strep throat.  Sinusitis.  Pneumonia.  Asthma. How is this treated? A URI goes away on its own with time. It cannot be cured with medicines, but medicines may be prescribed or recommended to relieve symptoms. Medicines may help:  Reduce your fever.  Reduce your cough.  Relieve nasal congestion. Follow these instructions at home:  Take medicines only as directed by your health care provider.  Gargle warm saltwater or take cough drops to comfort your throat as directed by your health care provider.  Use a warm mist humidifier or inhale steam from a shower to increase air moisture. This may make it easier to breathe.  Drink enough fluid to keep your urine clear or pale yellow.  Eat soups and other clear broths and maintain good nutrition.  Rest as needed.  Return to work when your temperature has returned to normal or as your health care provider advises. You may need to stay home longer to avoid infecting others. You can also use a face mask and careful hand washing to prevent spread of the virus.  Increase the usage of your inhaler if you have asthma.  Do not use any tobacco products, including cigarettes, chewing tobacco, or electronic cigarettes. If you need help quitting, ask your health care provider. How is this prevented? The best way to protect yourself from getting a cold is to practice good hygiene.  Avoid oral  or hand contact with people with cold symptoms.  Wash your hands often if contact occurs. There is no clear evidence that vitamin C, vitamin E, echinacea, or exercise reduces the chance of developing a cold. However, it is always recommended to get plenty of rest, exercise, and practice good nutrition. Contact a health care provider if:  You are getting worse rather than better.  Your symptoms are not controlled by  medicine.  You have chills.  You have worsening shortness of breath.  You have brown or red mucus.  You have yellow or brown nasal discharge.  You have pain in your face, especially when you bend forward.  You have a fever.  You have swollen neck glands.  You have pain while swallowing.  You have white areas in the back of your throat. Get help right away if:  You have severe or persistent:  Headache.  Ear pain.  Sinus pain.  Chest pain.  You have chronic lung disease and any of the following:  Wheezing.  Prolonged cough.  Coughing up blood.  A change in your usual mucus.  You have a stiff neck.  You have changes in your:  Vision.  Hearing.  Thinking.  Mood. This information is not intended to replace advice given to you by your health care provider. Make sure you discuss any questions you have with your health care provider. Document Released: 09/02/2000 Document Revised: 11/10/2015 Document Reviewed: 06/14/2013 Elsevier Interactive Patient Education  2017 [employer].

## 2016-04-20 ENCOUNTER — Other Ambulatory Visit: Payer: Self-pay | Admitting: Pulmonary Disease

## 2016-04-22 ENCOUNTER — Other Ambulatory Visit: Payer: Self-pay | Admitting: Pulmonary Disease

## 2016-04-22 NOTE — Telephone Encounter (Addendum)
Rx refill request. Pt asking for Symbicort 160 refill. Medication currently not on patient med list; med was started on 12/07/14 and ended on 08/09/15. Medication was removed by another office on 08/09/15. Pt last ov was 12/07/14 and symbicort was last refilled at this visit. Please advise if you would like to refill medication.   Last OV Note 9/16/116:   Today we updated your med list in our EPIC system...    Continue your current medications the same...  We gave you a NEB treatment in the office today...  We gave you a Depo shot 7 a new Rx for MEDROL 8mg  tabs to take as follows>    Start w/ one tab twice daily for 4 days...    Then decrease to 1 tab each AM for 4 days...    Then decrease to 1/2 tab daily each AM for 4 days...    Then decrease to 1/2 tab every other day til gone (1/2, 0, 1/2, 0, etc)...  Try the new SYMBICORT160- 2 sprays twice daily, and     add-in the OTC MUCINEX 600mg  tabs- one tab 4 times daily w/ fluids for the thick phlegm...  Call for any questions or if we can be of service in any way.Marland KitchenMarland Kitchen

## 2016-04-28 ENCOUNTER — Ambulatory Visit (INDEPENDENT_AMBULATORY_CARE_PROVIDER_SITE_OTHER): Payer: Medicare Other | Admitting: Internal Medicine

## 2016-04-28 ENCOUNTER — Encounter: Payer: Self-pay | Admitting: Internal Medicine

## 2016-04-28 VITALS — BP 132/74 | HR 60 | Temp 97.8°F | Wt 195.0 lb

## 2016-04-28 DIAGNOSIS — R0982 Postnasal drip: Secondary | ICD-10-CM | POA: Diagnosis not present

## 2016-04-28 DIAGNOSIS — R059 Cough, unspecified: Secondary | ICD-10-CM

## 2016-04-28 DIAGNOSIS — R05 Cough: Secondary | ICD-10-CM

## 2016-04-28 DIAGNOSIS — I10 Essential (primary) hypertension: Secondary | ICD-10-CM | POA: Diagnosis not present

## 2016-04-28 MED ORDER — LOSARTAN POTASSIUM 100 MG PO TABS: 100.0000 mg | ORAL_TABLET | Freq: Every day | ORAL | 0 refills | Status: DC

## 2016-04-28 NOTE — Progress Notes (Signed)
Subjective:    Patient ID: Brad Brown, male    DOB: 09-Apr-1945, 71 y.o.   MRN: [id number]   HPI  Pt presents to the clinic today for 3 week follow up of HTN. At his last visit, his BP was 173/90. 138/88 prior to that. His Losartan was increased to 50 mg daily. He is taking the medication as prescribed. His BP today is 132/74.  He is also concerned about a cough. This started 2 weeks ago. The cough is productive of clear mucous. He denies fever, chills, body aches or shortness of breath. He has taken some cough syrup OTC. He does have COPD and is taking Flovent daily. He has not had sick contacts that he is aware of.  Review of Systems  Past Medical History:  Diagnosis Date  . Arthritis   . Atrial fibrillation ([place])   . Automatic implantable cardiac defibrillator in situ   . Borderline diabetes mellitus   . CAD (coronary artery disease)   . Colonic polyp 2004   hyperplastic   . COPD (chronic obstructive pulmonary disease) ([place])   . Diabetes mellitus without complication ([place])   . GERD (gastroesophageal reflux disease)   . History of renal insufficiency syndrome   . HTN (hypertension)   . Hyperlipidemia   . Monoclonal gammopathy   . 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 Seizure disorder ([place])   . Stroke [facility])     Current Outpatient Prescriptions  Medication Sig Dispense Refill  . acetaminophen (TYLENOL) 500 MG tablet Take 1,000 mg by mouth every 6 (six) hours as needed (for pain/headache.).    Marland Kitchen benzonatate (TESSALON) 100 MG capsule Take 1 capsule (100 mg total) by mouth 3 (three) times daily as needed for cough. 30 capsule 0  . ciclopirox (LOPROX) 0.77 % cream Apply topically 2 (two) times daily. 15 g 0  . fluticasone (FLOVENT HFA) 110 MCG/ACT inhaler Inhale 1 puff into the lungs 2 (two) times daily. 1 Inhaler 12  . glipiZIDE (GLUCOTROL) 5 MG tablet Take 1 tablet (5 mg total) by mouth 2 (two) times daily before a meal. MUST SCHEDULE ANNUAL EXAM 180  tablet 0  . levETIRAcetam (KEPPRA) 500 MG tablet Take 500-1,000 mg by mouth 2 (two) times daily. Take one tablet by mouth in the morning and two tablets by mouth at night    . losartan (COZAAR) 50 MG tablet Take 1 tablet (50 mg total) by mouth daily. 30 tablet 0  . metoprolol tartrate (LOPRESSOR) 25 MG tablet Take 12.5 mg by mouth 2 (two) times daily.    . Multiple Vitamin (MULTIVITAMIN) tablet Take 1 tablet by mouth daily.      . naftifine (NAFTIN) 1 % cream Apply 1 application topically 2 (two) times daily as needed (For  infection between toes).     . Niacin, Antihyperlipidemic, 500 MG TABS Take 500 mg by mouth daily. 30 tablet 2  . Omega-3 Fatty Acids (FISH OIL) 1200 MG CAPS Take 1,200 mg by mouth daily.     . Rivaroxaban (XARELTO) 20 MG TABS tablet Take 20 mg by mouth daily at 2 PM.     . simvastatin (ZOCOR) 20 MG tablet Take 10 mg by mouth at bedtime.     . SYMBICORT 160-4.5 MCG/ACT inhaler INHALE 2 PUFFS INTO THE LUNGS 2 (TWO) TIMES DAILY. 10 Inhaler 0  . tamsulosin (FLOMAX) 0.4 MG CAPS capsule Take 0.4 mg by mouth daily.  5   No current facility-administered medications for this visit.  Allergies  Allergen Reactions  . Lipitor [Atorvastatin] Rash    Family History  Problem Relation Age of Onset  . Colon cancer Brother   . Heart attack Mother   . Heart attack Brother     Social History   Social History  . Marital status: Single    Spouse name: N/A  . Number of children: 1  . Years of education: N/A   Occupational History  . retired Retired   Social History Main Topics  . Smoking status: Former Smoker    Packs/day: 0.50    Years: 15.00    Types: Cigarettes    Quit date: 03/23/2004  . Smokeless tobacco: Never Used  . Alcohol use 0.6 oz/week    1 Glasses of wine per week     Comment: rare--wine  . Drug use: No  . Sexual activity: Not on file   Other Topics Concern  . Not on file   Social History Narrative   ICD-St. Jude  Remote-Yes     Constitutional:  Denies fever, malaise, fatigue, headache or abrupt weight changes.  HEENT: Denies eye pain, eye redness, ear pain, ringing in the ears, wax buildup, runny nose, nasal congestion, bloody nose, or sore throat. Respiratory: Pt reports cough. Denies difficulty breathing, shortness of breath.   Cardiovascular: Denies chest pain, chest tightness, palpitations or swelling in the hands or feet.    No other specific complaints in a complete review of systems (except as listed in HPI above).     Objective:   Physical Exam  BP 132/74   Pulse 60   Temp 97.8 F (36.6 C) (Oral)   Wt 195 lb (88.5 kg)   SpO2 98%   BMI 29.22 kg/m  Wt Readings from Last 3 Encounters:  04/28/16 195 lb (88.5 kg)  04/10/16 199 lb 6.4 oz (90.4 kg)  04/07/16 195 lb (88.5 kg)    General: Appears his stated age, in NAD. HEENT: Throat/Mouth: Teeth present, mucosa pink and moist, + PND, no exudate, lesions or ulcerations noted.  Neck:  No adenopathy noted.  Cardiovascular: Normal rate and rhythm. Pulmonary/Chest: Normal effort and positive vesicular breath sounds. No respiratory distress. No wheezes, rales or ronchi noted.   BMET    Component Value Date/Time   NA 139 04/07/2016 1503   NA 140 01/30/2016 1129   K 4.3 04/07/2016 1503   K 4.4 01/30/2016 1129   CL 103 04/07/2016 1503   CL 104 01/06/2012 1355   CO2 29 04/07/2016 1503   CO2 26 01/30/2016 1129   GLUCOSE 55 (L) 04/07/2016 1503   GLUCOSE 191 (H) 01/30/2016 1129   GLUCOSE 77 01/06/2012 1355   BUN 15 04/07/2016 1503   BUN 15.0 01/30/2016 1129   CREATININE 1.29 04/07/2016 1503   CREATININE 1.29 (H) 02/17/2016 1011   CREATININE 1.4 (H) 01/30/2016 1129   CALCIUM 9.6 04/07/2016 1503   CALCIUM 9.4 01/30/2016 1129   GFRNONAA 74.56 02/03/2010 1110   GFRAA 57 02/08/2008 1040    Lipid Panel     Component Value Date/Time   CHOL 140 04/07/2016 1503   TRIG 217.0 (H) 04/07/2016 1503   HDL 42.60 04/07/2016 1503   CHOLHDL 3 04/07/2016 1503   VLDL 43.4 (H)  04/07/2016 1503   LDLCALC 57 06/14/2013 1235    CBC    Component Value Date/Time   WBC 7.4 04/07/2016 1503   RBC 4.79 04/07/2016 1503   HGB 14.6 04/07/2016 1503   HGB 14.1 01/30/2016 1129   HCT 43.4 04/07/2016  1503   HCT 41.5 01/30/2016 1129   PLT 201.0 04/07/2016 1503   PLT 163 01/30/2016 1129   MCV 90.6 04/07/2016 1503   MCV 89.8 01/30/2016 1129   MCH 30.5 02/17/2016 1011   MCHC 33.6 04/07/2016 1503   RDW 13.4 04/07/2016 1503   RDW 12.4 01/30/2016 1129   LYMPHSABS 1.2 01/30/2016 1129   MONOABS 0.2 01/30/2016 1129   EOSABS 0.1 01/30/2016 1129   BASOSABS 0.0 01/30/2016 1129    Hgb A1C Lab Results  Component Value Date   HGBA1C 6.5 04/07/2016       Assessment & Plan:   Cough secondary to PND:  Start Claritin daily OTC  RTC in 3 weeks for BP check Danyel Griess, NP

## 2016-04-28 NOTE — Patient Instructions (Signed)
Hypertension Hypertension is another name for high blood pressure. High blood pressure forces your heart to work harder to pump blood. A blood pressure reading has two numbers, which includes a higher number over a lower number (example: 110/72). Follow these instructions at home:  Have your blood pressure rechecked by your doctor.  Only take medicine as told by your doctor. Follow the directions carefully. The medicine does not work as well if you skip doses. Skipping doses also puts you at risk for problems.  Do not smoke.  Monitor your blood pressure at home as told by your doctor. Contact a doctor if:  You think you are having a reaction to the medicine you are taking.  You have repeat headaches or feel dizzy.  You have puffiness (swelling) in your ankles.  You have trouble with your vision. Get help right away if:  You get a very bad headache and are confused.  You feel weak, numb, or faint.  You get chest or belly (abdominal) pain.  You throw up (vomit).  You cannot breathe very well. This information is not intended to replace advice given to you by your health care provider. Make sure you discuss any questions you have with your health care provider. Document Released: 08/26/2007 Document Revised: 08/15/2015 Document Reviewed: 12/30/2012 Elsevier Interactive Patient Education  2017 Elsevier Inc.  

## 2016-04-28 NOTE — Assessment & Plan Note (Signed)
Still not at goal.  Increase Losartan to 100 mg daily

## 2016-05-19 ENCOUNTER — Ambulatory Visit (INDEPENDENT_AMBULATORY_CARE_PROVIDER_SITE_OTHER): Payer: Medicare Other | Admitting: Internal Medicine

## 2016-05-19 ENCOUNTER — Encounter: Payer: Self-pay | Admitting: Internal Medicine

## 2016-05-19 DIAGNOSIS — I10 Essential (primary) hypertension: Secondary | ICD-10-CM | POA: Diagnosis not present

## 2016-05-19 NOTE — Progress Notes (Signed)
Subjective:    Patient ID: Brad Brown, male    DOB: 10/03/1945, 71 y.o.   MRN: [id number]  HPI  Pt presents to the clinic today for 3 week follow up of HTN. His BP at his last visit was 132/74. His Losartan was increased to 100 mg daily. He has been taking the medication as prescribed and denies adverse side effects. He went to the [place] yesterday for a check up. His BP was 158/90.They started him on Nifedapine 30 mg daily. His BP today is 124/70.  Review of Systems  Past Medical History:  Diagnosis Date  . Arthritis   . Atrial fibrillation ([place])   . Automatic implantable cardiac defibrillator in situ   . Borderline diabetes mellitus   . CAD (coronary artery disease)   . Colonic polyp 2004   hyperplastic   . COPD (chronic obstructive pulmonary disease) ([place])   . Diabetes mellitus without complication ([place])   . GERD (gastroesophageal reflux disease)   . History of renal insufficiency syndrome   . HTN (hypertension)   . Hyperlipidemia   . Monoclonal gammopathy   . 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 Seizure disorder ([place])   . Stroke [facility])     Current Outpatient Prescriptions  Medication Sig Dispense Refill  . acetaminophen (TYLENOL) 500 MG tablet Take 1,000 mg by mouth every 6 (six) hours as needed (for pain/headache.).    Marland Kitchen ciclopirox (LOPROX) 0.77 % cream Apply topically 2 (two) times daily. 15 g 0  . fluticasone (FLOVENT HFA) 110 MCG/ACT inhaler Inhale 1 puff into the lungs 2 (two) times daily. 1 Inhaler 12  . glipiZIDE (GLUCOTROL) 5 MG tablet Take 1 tablet (5 mg total) by mouth 2 (two) times daily before a meal. MUST SCHEDULE ANNUAL EXAM 180 tablet 0  . levETIRAcetam (KEPPRA) 500 MG tablet Take 500-1,000 mg by mouth 2 (two) times daily. Take one tablet by mouth in the morning and two tablets by mouth at night    . losartan (COZAAR) 100 MG tablet Take 1 tablet (100 mg total) by mouth daily. 30 tablet 0  . metoprolol tartrate (LOPRESSOR) 25 MG tablet  Take 12.5 mg by mouth 2 (two) times daily.    . Multiple Vitamin (MULTIVITAMIN) tablet Take 1 tablet by mouth daily.      . naftifine (NAFTIN) 1 % cream Apply 1 application topically 2 (two) times daily as needed (For  infection between toes).     . Niacin, Antihyperlipidemic, 500 MG TABS Take 500 mg by mouth daily. 30 tablet 2  . Omega-3 Fatty Acids (FISH OIL) 1200 MG CAPS Take 1,200 mg by mouth daily.     . Rivaroxaban (XARELTO) 20 MG TABS tablet Take 20 mg by mouth daily at 2 PM.     . simvastatin (ZOCOR) 20 MG tablet Take 10 mg by mouth at bedtime.     . SYMBICORT 160-4.5 MCG/ACT inhaler INHALE 2 PUFFS INTO THE LUNGS 2 (TWO) TIMES DAILY. 10 Inhaler 0  . tamsulosin (FLOMAX) 0.4 MG CAPS capsule Take 0.4 mg by mouth daily.  5   No current facility-administered medications for this visit.     Allergies  Allergen Reactions  . Lipitor [Atorvastatin] Rash    Family History  Problem Relation Age of Onset  . Colon cancer Brother   . Heart attack Mother   . Heart attack Brother     Social History   Social History  . Marital status: Single    Spouse name:  N/A  . Number of children: 1  . Years of education: N/A   Occupational History  . retired Retired   Social History Main Topics  . Smoking status: Former Smoker    Packs/day: 0.50    Years: 15.00    Types: Cigarettes    Quit date: 03/23/2004  . Smokeless tobacco: Never Used  . Alcohol use 0.6 oz/week    1 Glasses of wine per week     Comment: rare--wine  . Drug use: No  . Sexual activity: Not on file   Other Topics Concern  . Not on file   Social History Narrative   ICD-St. Jude  Remote-Yes     Constitutional: Denies fever, malaise, fatigue, headache or abrupt weight changes.  Respiratory: Denies difficulty breathing, shortness of breath, cough or sputum production.   Cardiovascular: Denies chest pain, chest tightness, palpitations or swelling in the hands or feet.  Neurological: Denies dizziness, difficulty with  memory, difficulty with speech or problems with balance and coordination.    No other specific complaints in a complete review of systems (except as listed in HPI above).     Objective:   Physical Exam   BP 124/70   Pulse 61   Temp 97.7 F (36.5 C) (Oral)   Wt 200 lb (90.7 kg)   SpO2 98%   BMI 29.97 kg/m  Wt Readings from Last 3 Encounters:  05/19/16 200 lb (90.7 kg)  04/28/16 195 lb (88.5 kg)  04/10/16 199 lb 6.4 oz (90.4 kg)    General: Appears her stated age, obese in NAD. Cardiovascular: Normal rate and rhythm.  Pulmonary/Chest: Normal effort and positive vesicular breath sounds. No respiratory distress. No wheezes, rales or ronchi noted.  Neurological: Alert and oriented.    BMET    Component Value Date/Time   NA 139 04/07/2016 1503   NA 140 01/30/2016 1129   K 4.3 04/07/2016 1503   K 4.4 01/30/2016 1129   CL 103 04/07/2016 1503   CL 104 01/06/2012 1355   CO2 29 04/07/2016 1503   CO2 26 01/30/2016 1129   GLUCOSE 55 (L) 04/07/2016 1503   GLUCOSE 191 (H) 01/30/2016 1129   GLUCOSE 77 01/06/2012 1355   BUN 15 04/07/2016 1503   BUN 15.0 01/30/2016 1129   CREATININE 1.29 04/07/2016 1503   CREATININE 1.29 (H) 02/17/2016 1011   CREATININE 1.4 (H) 01/30/2016 1129   CALCIUM 9.6 04/07/2016 1503   CALCIUM 9.4 01/30/2016 1129   GFRNONAA 74.56 02/03/2010 1110   GFRAA 57 02/08/2008 1040    Lipid Panel     Component Value Date/Time   CHOL 140 04/07/2016 1503   TRIG 217.0 (H) 04/07/2016 1503   HDL 42.60 04/07/2016 1503   CHOLHDL 3 04/07/2016 1503   VLDL 43.4 (H) 04/07/2016 1503   LDLCALC 57 06/14/2013 1235    CBC    Component Value Date/Time   WBC 7.4 04/07/2016 1503   RBC 4.79 04/07/2016 1503   HGB 14.6 04/07/2016 1503   HGB 14.1 01/30/2016 1129   HCT 43.4 04/07/2016 1503   HCT 41.5 01/30/2016 1129   PLT 201.0 04/07/2016 1503   PLT 163 01/30/2016 1129   MCV 90.6 04/07/2016 1503   MCV 89.8 01/30/2016 1129   MCH 30.5 02/17/2016 1011   MCHC 33.6  04/07/2016 1503   RDW 13.4 04/07/2016 1503   RDW 12.4 01/30/2016 1129   LYMPHSABS 1.2 01/30/2016 1129   MONOABS 0.2 01/30/2016 1129   EOSABS 0.1 01/30/2016 1129   BASOSABS 0.0  01/30/2016 1129    Hgb A1C Lab Results  Component Value Date   HGBA1C 6.5 04/07/2016           Assessment & Plan:

## 2016-05-19 NOTE — Assessment & Plan Note (Signed)
Now at goal on Losartan and Nifedapine Continue to monitor

## 2016-05-19 NOTE — Patient Instructions (Signed)
Place hypertension patient instructions here.

## 2016-05-22 ENCOUNTER — Encounter: Payer: Self-pay | Admitting: Internal Medicine

## 2016-05-22 ENCOUNTER — Ambulatory Visit (INDEPENDENT_AMBULATORY_CARE_PROVIDER_SITE_OTHER): Payer: Medicare Other | Admitting: Internal Medicine

## 2016-05-22 ENCOUNTER — Telehealth: Payer: Self-pay

## 2016-05-22 VITALS — BP 144/80 | HR 57 | Ht 69.0 in | Wt 197.0 lb

## 2016-05-22 DIAGNOSIS — Z95810 Presence of automatic (implantable) cardiac defibrillator: Secondary | ICD-10-CM | POA: Diagnosis not present

## 2016-05-22 DIAGNOSIS — I498 Other specified cardiac arrhythmias: Secondary | ICD-10-CM

## 2016-05-22 LAB — CUP PACEART INCLINIC DEVICE CHECK
Brady Statistic RV Percent Paced: 0.19 %
HIGH POWER IMPEDANCE MEASURED VALUE: 52.5938
Implantable Lead Implant Date: 20080527
Implantable Lead Model: 7121
Implantable Pulse Generator Implant Date: 20171201
Lead Channel Pacing Threshold Amplitude: 1 V
Lead Channel Pacing Threshold Pulse Width: 0.5 ms
Lead Channel Pacing Threshold Pulse Width: 0.5 ms
Lead Channel Setting Pacing Pulse Width: 0.5 ms
Lead Channel Setting Sensing Sensitivity: 0.5 mV
MDC IDC LEAD LOCATION: 753860
MDC IDC MSMT LEADCHNL RV IMPEDANCE VALUE: 437.5 Ohm
MDC IDC MSMT LEADCHNL RV PACING THRESHOLD AMPLITUDE: 1 V
MDC IDC MSMT LEADCHNL RV SENSING INTR AMPL: 12 mV
MDC IDC PG SERIAL: 7283394
MDC IDC SESS DTM: 20180302133348
MDC IDC SET LEADCHNL RV PACING AMPLITUDE: 2.5 V

## 2016-05-22 NOTE — Patient Instructions (Addendum)
Medication Instructions:  Your physician recommends that you continue on your current medications as directed. Please refer to the Current Medication list given to you today.   Labwork: None Ordered   Testing/Procedures: None Ordered   Follow-Up: Your physician wants you to follow-up in: 9 months with Dr. Lovena Le.  You will receive a reminder letter in the mail two months in advance. If you don't receive a letter, please call our office to schedule the follow-up appointment.  Remote monitoring is used to monitor your ICD from home. This monitoring reduces the number of office visits required to check your device to one time per year. It allows [place] to keep an eye on the functioning of your device to ensure it is working properly. You are scheduled for a device check from home on 08/24/16. You may send your transmission at any time that day. If you have a wireless device, the transmission will be sent automatically. After your physician reviews your transmission, you will receive a postcard with your next transmission date. .   Any Other Special Instructions Will Be Listed Below (If Applicable).     If you need a refill on your cardiac medications before your next appointment, please call your pharmacy.

## 2016-05-22 NOTE — Progress Notes (Signed)
HPI Brad Brown returns today for followup. He is a very pleasant 71 year old man with coronary artery disease, and ischemic cardiomyopathy, PAF, Brugada syndrome, status post ICD implantation. He returns today for followup.He has undergone ICD gen change and is doing well. No edema.  Allergies  Allergen Reactions  . Lipitor [Atorvastatin] Rash     Current Outpatient Prescriptions  Medication Sig Dispense Refill  . acetaminophen (TYLENOL) 500 MG tablet Take 1,000 mg by mouth every 6 (six) hours as needed (for pain/headache.).    Marland Kitchen ciclopirox (LOPROX) 0.77 % cream Apply 1 application topically 2 (two) times daily.    . fluticasone (FLOVENT HFA) 110 MCG/ACT inhaler Inhale 1 puff into the lungs 2 (two) times daily. 1 Inhaler 12  . glipiZIDE (GLUCOTROL) 5 MG tablet Take 1 tablet (5 mg total) by mouth 2 (two) times daily before a meal. MUST SCHEDULE ANNUAL EXAM 180 tablet 0  . levETIRAcetam (KEPPRA) 500 MG tablet Take 500-1,000 mg by mouth 2 (two) times daily. Take one tablet by mouth in the morning and two tablets by mouth at night    . losartan (COZAAR) 100 MG tablet Take 1 tablet (100 mg total) by mouth daily. 30 tablet 0  . metoprolol tartrate (LOPRESSOR) 25 MG tablet Take 12.5 mg by mouth 2 (two) times daily.    . Multiple Vitamin (MULTIVITAMIN) tablet Take 1 tablet by mouth daily.      . naftifine (NAFTIN) 1 % cream Apply 1 application topically 2 (two) times daily as needed (For  infection between toes).     . Niacin, Antihyperlipidemic, 500 MG TABS Take 500 mg by mouth daily. 30 tablet 2  . NIFEdipine (PROCARDIA-XL/ADALAT CC) 30 MG 24 hr tablet Take 30 mg by mouth daily.    . Omega-3 Fatty Acids (FISH OIL) 1200 MG CAPS Take 1,200 mg by mouth daily.     . Rivaroxaban (XARELTO) 20 MG TABS tablet Take 20 mg by mouth daily at 2 PM.     . simvastatin (ZOCOR) 20 MG tablet Take 10 mg by mouth at bedtime.     . SYMBICORT 160-4.5 MCG/ACT inhaler INHALE 2 PUFFS INTO THE LUNGS 2 (TWO) TIMES DAILY.  10 Inhaler 0  . tamsulosin (FLOMAX) 0.4 MG CAPS capsule Take 0.4 mg by mouth daily.  5   No current facility-administered medications for this visit.      Past Medical History:  Diagnosis Date  . Arthritis   . Atrial fibrillation ([place])   . Automatic implantable cardiac defibrillator in situ   . Borderline diabetes mellitus   . CAD (coronary artery disease)   . Colonic polyp 2004   hyperplastic   . COPD (chronic obstructive pulmonary disease) ([place])   . Diabetes mellitus without complication ([place])   . GERD (gastroesophageal reflux disease)   . History of renal insufficiency syndrome   . HTN (hypertension)   . Hyperlipidemia   . Monoclonal gammopathy   . 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 Seizure disorder ([place])   . Stroke ([place])     ROS:   All systems reviewed and negative except as noted in the HPI.   Past Surgical History:  Procedure Laterality Date  . CARDIAC CATHETERIZATION  1987   Showed distal left circumflex 100% occluded   . CARDIAC DEFIBRILLATOR PLACEMENT  08/17/2006   Implantation of a St. Jude single chamber defibrillator  . EP IMPLANTABLE DEVICE N/A 02/21/2016   Procedure: ICD Generator Changeout;  Surgeon: Evans Lance, MD;  Location: [facility]  INVASIVE CV LAB;  Service: Cardiovascular;  Laterality: N/A;  . INGUINAL HERNIA REPAIR Left      Family History  Problem Relation Age of Onset  . Colon cancer Brother   . Heart attack Mother   . Heart attack Brother      Social History   Social History  . Marital status: Single    Spouse name: N/A  . Number of children: 1  . Years of education: N/A   Occupational History  . retired Retired   Social History Main Topics  . Smoking status: Former Smoker    Packs/day: 0.50    Years: 15.00    Types: Cigarettes    Quit date: 03/23/2004  . Smokeless tobacco: Never Used  . Alcohol use 0.6 oz/week    1 Glasses of wine per week     Comment: rare--wine  . Drug use: No  . Sexual activity: Not  on file   Other Topics Concern  . Not on file   Social History Narrative   ICD-St. Jude  Remote-Yes     BP (!) 144/80   Pulse (!) 57   Ht 5\' 9"  (1.753 m)   Wt 197 lb (89.4 kg)   BMI 29.09 kg/m   Physical Exam:  Well appearing 71 year old man,NAD HEENT: Unremarkable Neck:  6 cm JVD, no thyromegally Lungs:  Clear with no wheezes, rales, or rhonchi. HEART:  Regular rate rhythm, no murmurs, no rubs, no clicks, soft S4. Abd:  soft, positive bowel sounds, no organomegally, no rebound, no guarding Ext:  2 plus pulses, no edema, no cyanosis, no clubbing Skin:  No rashes no nodules Neuro:  CN II through XII intact, motor grossly intact   DEVICE  Normal device function.  See PaceArt for details.   Assess/Plan: 1. 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 2. HTN - he has had no blood pressure meds this a.m. He admits to some sodium indiscretion. 3. ICD - his device has undergone gen change and is working normally and his incision is well healed.  [name].D.

## 2016-05-22 NOTE — Telephone Encounter (Signed)
Called, pt unavailable. Informed pt's appt for 07/21/16 has been cancelled. Pt's next recall will be December 2018.

## 2016-06-17 ENCOUNTER — Encounter: Payer: Self-pay | Admitting: Gastroenterology

## 2016-07-21 ENCOUNTER — Encounter: Payer: Medicare Other | Admitting: Internal Medicine

## 2016-08-03 ENCOUNTER — Other Ambulatory Visit: Payer: Self-pay | Admitting: Internal Medicine

## 2016-08-24 ENCOUNTER — Telehealth: Payer: Self-pay | Admitting: Cardiology

## 2016-08-24 ENCOUNTER — Ambulatory Visit (INDEPENDENT_AMBULATORY_CARE_PROVIDER_SITE_OTHER): Payer: Medicare Other | Admitting: *Deleted

## 2016-08-24 DIAGNOSIS — I4901 Ventricular fibrillation: Secondary | ICD-10-CM

## 2016-08-24 NOTE — Telephone Encounter (Signed)
LMOVM reminding pt to send remote transmission.   

## 2016-08-28 ENCOUNTER — Encounter: Payer: Medicare Other | Admitting: Internal Medicine

## 2016-08-28 LAB — CUP PACEART REMOTE DEVICE CHECK
Brady Statistic RV Percent Paced: 1 %
HIGH POWER IMPEDANCE MEASURED VALUE: 49 Ohm
HighPow Impedance: 49 Ohm
Implantable Lead Location: 753860
Implantable Pulse Generator Implant Date: 20171201
Lead Channel Pacing Threshold Amplitude: 1 V
Lead Channel Pacing Threshold Pulse Width: 0.5 ms
Lead Channel Sensing Intrinsic Amplitude: 12 mV
Lead Channel Setting Pacing Pulse Width: 0.5 ms
Lead Channel Setting Sensing Sensitivity: 0.5 mV
MDC IDC LEAD IMPLANT DT: 20080527
MDC IDC MSMT BATTERY REMAINING LONGEVITY: 91 mo
MDC IDC MSMT BATTERY REMAINING PERCENTAGE: 88 %
MDC IDC MSMT BATTERY VOLTAGE: 3.07 V
MDC IDC MSMT LEADCHNL RV IMPEDANCE VALUE: 530 Ohm
MDC IDC SESS DTM: 20180606195830
MDC IDC SET LEADCHNL RV PACING AMPLITUDE: 2.5 V
Pulse Gen Serial Number: 7283394

## 2016-08-28 NOTE — Progress Notes (Signed)
Remote ICD transmission.   

## 2016-09-01 ENCOUNTER — Encounter: Payer: Self-pay | Admitting: Cardiology

## 2016-11-25 ENCOUNTER — Ambulatory Visit (INDEPENDENT_AMBULATORY_CARE_PROVIDER_SITE_OTHER): Payer: Medicare Other | Admitting: *Deleted

## 2016-11-25 DIAGNOSIS — I4901 Ventricular fibrillation: Secondary | ICD-10-CM | POA: Diagnosis not present

## 2016-11-26 NOTE — Progress Notes (Signed)
Remote ICD transmission.   

## 2016-12-01 ENCOUNTER — Encounter: Payer: Self-pay | Admitting: Cardiology

## 2016-12-10 DIAGNOSIS — N182 Chronic kidney disease, stage 2 (mild): Secondary | ICD-10-CM | POA: Diagnosis not present

## 2016-12-10 DIAGNOSIS — D649 Anemia, unspecified: Secondary | ICD-10-CM | POA: Diagnosis not present

## 2016-12-10 DIAGNOSIS — N2581 Secondary hyperparathyroidism of renal origin: Secondary | ICD-10-CM | POA: Diagnosis not present

## 2016-12-10 DIAGNOSIS — I480 Paroxysmal atrial fibrillation: Secondary | ICD-10-CM | POA: Diagnosis not present

## 2016-12-14 ENCOUNTER — Encounter: Payer: Self-pay | Admitting: *Deleted

## 2016-12-16 LAB — CUP PACEART REMOTE DEVICE CHECK
Battery Voltage: 3.02 V
Brady Statistic RV Percent Paced: 1 %
Date Time Interrogation Session: 20180905122522
HIGH POWER IMPEDANCE MEASURED VALUE: 52 Ohm
HighPow Impedance: 52 Ohm
Implantable Lead Location: 753860
Lead Channel Pacing Threshold Pulse Width: 0.5 ms
Lead Channel Sensing Intrinsic Amplitude: 10.6 mV
Lead Channel Setting Sensing Sensitivity: 0.5 mV
MDC IDC LEAD IMPLANT DT: 20080527
MDC IDC MSMT BATTERY REMAINING LONGEVITY: 89 mo
MDC IDC MSMT BATTERY REMAINING PERCENTAGE: 86 %
MDC IDC MSMT LEADCHNL RV IMPEDANCE VALUE: 440 Ohm
MDC IDC MSMT LEADCHNL RV PACING THRESHOLD AMPLITUDE: 1 V
MDC IDC PG IMPLANT DT: 20171201
MDC IDC SET LEADCHNL RV PACING AMPLITUDE: 2.5 V
MDC IDC SET LEADCHNL RV PACING PULSEWIDTH: 0.5 ms
Pulse Gen Serial Number: 7283394

## 2017-01-26 DIAGNOSIS — Z125 Encounter for screening for malignant neoplasm of prostate: Secondary | ICD-10-CM | POA: Diagnosis not present

## 2017-01-29 ENCOUNTER — Other Ambulatory Visit (HOSPITAL_BASED_OUTPATIENT_CLINIC_OR_DEPARTMENT_OTHER): Payer: Medicare Other

## 2017-01-29 DIAGNOSIS — D472 Monoclonal gammopathy: Secondary | ICD-10-CM

## 2017-01-29 LAB — CBC WITH DIFFERENTIAL/PLATELET
BASO%: 0.8 % (ref 0.0–2.0)
BASOS ABS: 0 10*3/uL (ref 0.0–0.1)
EOS ABS: 0.1 10*3/uL (ref 0.0–0.5)
EOS%: 1.5 % (ref 0.0–7.0)
HEMATOCRIT: 40.9 % (ref 38.4–49.9)
HEMOGLOBIN: 13.5 g/dL (ref 13.0–17.1)
LYMPH#: 1.1 10*3/uL (ref 0.9–3.3)
LYMPH%: 27.9 % (ref 14.0–49.0)
MCH: 30.3 pg (ref 27.2–33.4)
MCHC: 33 g/dL (ref 32.0–36.0)
MCV: 91.7 fL (ref 79.3–98.0)
MONO#: 0.2 10*3/uL (ref 0.1–0.9)
MONO%: 5.1 % (ref 0.0–14.0)
NEUT%: 64.7 % (ref 39.0–75.0)
NEUTROS ABS: 2.6 10*3/uL (ref 1.5–6.5)
Platelets: 187 10*3/uL (ref 140–400)
RBC: 4.46 10*6/uL (ref 4.20–5.82)
RDW: 12.5 % (ref 11.0–14.6)
WBC: 3.9 10*3/uL — ABNORMAL LOW (ref 4.0–10.3)

## 2017-02-01 LAB — KAPPA/LAMBDA LIGHT CHAINS
IG KAPPA FREE LIGHT CHAIN: 118.4 mg/L — AB (ref 3.3–19.4)
Ig Lambda Free Light Chain: 8.2 mg/L (ref 5.7–26.3)
Kappa/Lambda FluidC Ratio: 14.44 — ABNORMAL HIGH (ref 0.26–1.65)

## 2017-02-02 DIAGNOSIS — N401 Enlarged prostate with lower urinary tract symptoms: Secondary | ICD-10-CM | POA: Diagnosis not present

## 2017-02-02 DIAGNOSIS — R350 Frequency of micturition: Secondary | ICD-10-CM | POA: Diagnosis not present

## 2017-02-02 LAB — MULTIPLE MYELOMA PANEL, SERUM
ALPHA 1: 0.2 g/dL (ref 0.0–0.4)
ALPHA2 GLOB SERPL ELPH-MCNC: 0.7 g/dL (ref 0.4–1.0)
Albumin SerPl Elph-Mcnc: 4 g/dL (ref 2.9–4.4)
Albumin/Glob SerPl: 1.3 (ref 0.7–1.7)
B-GLOBULIN SERPL ELPH-MCNC: 0.9 g/dL (ref 0.7–1.3)
GAMMA GLOB SERPL ELPH-MCNC: 1.5 g/dL (ref 0.4–1.8)
GLOBULIN, TOTAL: 3.2 g/dL (ref 2.2–3.9)
IgA, Qn, Serum: 48 mg/dL — ABNORMAL LOW (ref 61–437)
IgM, Qn, Serum: 16 mg/dL — ABNORMAL LOW (ref 20–172)
M PROTEIN SERPL ELPH-MCNC: 1 g/dL — AB
Total Protein: 7.2 g/dL (ref 6.0–8.5)

## 2017-02-05 ENCOUNTER — Telehealth: Payer: Self-pay | Admitting: Oncology

## 2017-02-05 ENCOUNTER — Ambulatory Visit (HOSPITAL_BASED_OUTPATIENT_CLINIC_OR_DEPARTMENT_OTHER): Payer: Medicare Other | Admitting: Oncology

## 2017-02-05 VITALS — BP 126/75 | HR 50 | Temp 98.0°F | Resp 18 | Ht 69.0 in | Wt 195.8 lb

## 2017-02-05 DIAGNOSIS — I498 Other specified cardiac arrhythmias: Secondary | ICD-10-CM

## 2017-02-05 DIAGNOSIS — G40909 Epilepsy, unspecified, not intractable, without status epilepticus: Secondary | ICD-10-CM | POA: Diagnosis not present

## 2017-02-05 DIAGNOSIS — D472 Monoclonal gammopathy: Secondary | ICD-10-CM

## 2017-02-05 DIAGNOSIS — N289 Disorder of kidney and ureter, unspecified: Secondary | ICD-10-CM

## 2017-02-05 NOTE — Telephone Encounter (Signed)
Scheduled appt per 11/16 los - Gave patient AVS and calender per los.  

## 2017-02-05 NOTE — Progress Notes (Signed)
Hematology and Oncology Follow Up Visit  Brad Brown [id number] 1945/04/30 71 y.o. 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 without any evidence of end-organ damage. He does have mild chronic renal insufficiency which is unchanged since 2008.  Current therapy: He is under observation and surveillance.   Interim History:  Brad Brown presents today for a followup visit by himself. Since the last visit, he reports feeling well. He denied any pathological fractures or recurrent infections. He does not report any peripheral neuropathy or worsening performance status. His cardiac and kidney status have been stable.  Continues to be active and attends to activities of daily living.  Does not report hospitalizations.  His performance status is unchanged.   He does not report any headaches or blurry vision or syncope. He does not report any fevers, chills, sweats or weight loss. He does not report any chest pain, palpitation or orthopnea. He does not report leg edema cough or shortness of breath. He does not report any nausea, vomiting or difficulty eating. He does not report any lymphadenopathy or petechiae. He does not report any urinary symptoms such as dysuria or hematuria and the rest of his review of systems unremarkable.  Medications: I have reviewed the patient's current medications.  Current Outpatient Medications  Medication Sig Dispense Refill  . acetaminophen (TYLENOL) 500 MG tablet Take 1,000 mg by mouth every 6 (six) hours as needed (for pain/headache.).    Marland Kitchen ciclopirox (LOPROX) 0.77 % cream Apply 1 application topically 2 (two) times daily.    . fluticasone (FLOVENT HFA) 110 MCG/ACT inhaler Inhale 1 puff into the lungs 2 (two) times daily. 1 Inhaler 12  . glipiZIDE (GLUCOTROL) 5 MG tablet Take 1 tablet (5 mg total) by mouth 2 (two) times daily before a meal.  MUST SCHEDULE ANNUAL EXAM 180 tablet 0  . levETIRAcetam (KEPPRA) 500 MG tablet Take 500-1,000 mg by mouth 2 (two) times daily. Take one tablet by mouth in the morning and two tablets by mouth at night    . losartan (COZAAR) 100 MG tablet TAKE 1 TABLET BY MOUTH EVERY DAY 30 tablet 5  . metoprolol tartrate (LOPRESSOR) 25 MG tablet Take 12.5 mg by mouth 2 (two) times daily.    . Multiple Vitamin (MULTIVITAMIN) tablet Take 1 tablet by mouth daily.      . naftifine (NAFTIN) 1 % cream Apply 1 application topically 2 (two) times daily as needed (For  infection between toes).     . Niacin, Antihyperlipidemic, 500 MG TABS Take 500 mg by mouth daily. 30 tablet 2  . NIFEdipine (PROCARDIA-XL/ADALAT CC) 30 MG 24 hr tablet Take 30 mg by mouth daily.    . Omega-3 Fatty Acids (FISH OIL) 1200 MG CAPS Take 1,200 mg by mouth daily.     . Rivaroxaban (XARELTO) 20 MG TABS tablet Take 20 mg by mouth daily at 2 PM.     . simvastatin (ZOCOR) 20 MG tablet Take 10 mg by mouth at bedtime.     . SYMBICORT 160-4.5 MCG/ACT inhaler INHALE 2 PUFFS INTO THE LUNGS 2 (TWO) TIMES DAILY. 10 Inhaler 0  . tamsulosin (FLOMAX) 0.4 MG CAPS capsule Take 0.4 mg by mouth daily.  5   No current facility-administered medications for this visit.     Allergies:  Allergies  Allergen Reactions  . Lipitor [Atorvastatin] Rash    Past Medical History, Surgical history, Social history, and Family  History were reviewed and updated.  Physical Exam: Blood pressure 126/75, pulse (!) 50, temperature 98 F (36.7 C), temperature source Oral, resp. rate 18, height 5' 9"  (1.753 m), weight 195 lb 12.8 oz (88.8 kg), SpO2 100 %. ECOG: 0 General appearance: Alert, awake gentleman without distress. Head: Normocephalic, without obvious abnormality no oral ulcers or lesions. Neck: no adenopathy or masses. Lymph nodes: Cervical, supraclavicular, and axillary nodes normal. Heart:regular rate and rhythm, S1, S2 normal, no murmur, click, rub or  gallop Lung:chest clear, no wheezing, rales, normal symmetric air entry Abdomin: soft, non-tender, without masses or organomegaly no shifting dullness or ascites. EXT:no erythema, induration, or nodules   Lab Results: Lab Results  Component Value Date   WBC 3.9 (L) 01/29/2017   HGB 13.5 01/29/2017   HCT 40.9 01/29/2017   MCV 91.7 01/29/2017   PLT 187 01/29/2017     Chemistry      Component Value Date/Time   NA 139 04/07/2016 1503   NA 140 01/30/2016 1129   K 4.3 04/07/2016 1503   K 4.4 01/30/2016 1129   CL 103 04/07/2016 1503   CL 104 01/06/2012 1355   CO2 29 04/07/2016 1503   CO2 26 01/30/2016 1129   BUN 15 04/07/2016 1503   BUN 15.0 01/30/2016 1129   CREATININE 1.29 04/07/2016 1503   CREATININE 1.29 (H) 02/17/2016 1011   CREATININE 1.4 (H) 01/30/2016 1129      Component Value Date/Time   CALCIUM 9.6 04/07/2016 1503   CALCIUM 9.4 01/30/2016 1129   ALKPHOS 50 04/07/2016 1503   ALKPHOS 78 01/30/2016 1129   AST 18 04/07/2016 1503   AST 20 01/30/2016 1129   ALT 23 04/07/2016 1503   ALT 28 01/30/2016 1129   BILITOT 1.1 04/07/2016 1503   BILITOT 0.82 01/30/2016 1129      Results for Brad Brown, Brad Brown (MRN [id number]) as of 02/05/2017 09:49  Ref. Range 01/28/2015 12:38 01/29/2017 09:47  IgG (Immunoglobin G), Serum Latest Ref Range: 700 - 1600 mg/dL 1,460 1,631 (H)  IgA Latest Ref Range: 68 - 379 mg/dL 46 (L)   IgM, Serum Latest Ref Range: 41 - 251 mg/dL 21 (L)   IgM, Qn, Serum Latest Ref Range: 20 - 172 mg/dL  16 (L)  Total Protein, Serum Electrophoresis Latest Ref Range: 6.1 - 8.1 g/dL 7.0   Kappa free light chain Latest Ref Range: 0.33 - 1.94 mg/dL 8.31 (H)   Lambda Free Lght Chn Latest Ref Range: 0.57 - 2.63 mg/dL 0.46 (L)   Kappa:Lambda Ratio Latest Ref Range: 0.26 - 1.65  18.07 (H)   M Protein SerPl Elph-Mcnc Latest Ref Range: Not Observed g/dL  1.0 (H)      Impression and Plan:  71 year old gentleman with the following issues:  1. 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  His protein studies obtained on 01/29/2017 showed that his M spike and IgG level has not changed.  I see no evidence to suggest end organ damage for symptomatic myeloma.  The plan is to continue with active surveillance and repeat protein studies annually. 2. Renal insufficiency: His creatinine remained stable at this time. 3. Seizure disorder.  No seizure activity recorded as of late. 4. Brugada syndrome:  followed by cardiology with an ICD in place. 5. Follow-up: Will be in one year for laboratory testing and a follow-up.     Zola Button, MD 11/16/201810:11 AM

## 2017-02-24 ENCOUNTER — Ambulatory Visit (INDEPENDENT_AMBULATORY_CARE_PROVIDER_SITE_OTHER): Payer: Medicare Other | Admitting: *Deleted

## 2017-02-24 DIAGNOSIS — I4901 Ventricular fibrillation: Secondary | ICD-10-CM

## 2017-02-24 NOTE — Progress Notes (Signed)
Remote ICD transmission.   

## 2017-02-25 ENCOUNTER — Encounter: Payer: Self-pay | Admitting: Cardiology

## 2017-02-25 LAB — CUP PACEART REMOTE DEVICE CHECK
Battery Voltage: 3.01 V
Brady Statistic RV Percent Paced: 1 %
HIGH POWER IMPEDANCE MEASURED VALUE: 51 Ohm
HighPow Impedance: 50 Ohm
Implantable Lead Location: 753860
Lead Channel Pacing Threshold Amplitude: 1 V
Lead Channel Pacing Threshold Pulse Width: 0.5 ms
Lead Channel Sensing Intrinsic Amplitude: 12 mV
Lead Channel Setting Pacing Pulse Width: 0.5 ms
MDC IDC LEAD IMPLANT DT: 20080527
MDC IDC MSMT BATTERY REMAINING LONGEVITY: 88 mo
MDC IDC MSMT BATTERY REMAINING PERCENTAGE: 84 %
MDC IDC MSMT LEADCHNL RV IMPEDANCE VALUE: 550 Ohm
MDC IDC PG IMPLANT DT: 20171201
MDC IDC SESS DTM: 20181205141943
MDC IDC SET LEADCHNL RV PACING AMPLITUDE: 2.5 V
MDC IDC SET LEADCHNL RV SENSING SENSITIVITY: 0.5 mV
Pulse Gen Serial Number: 7283394

## 2017-03-12 ENCOUNTER — Ambulatory Visit: Payer: Medicare Other | Admitting: Internal Medicine

## 2017-03-12 ENCOUNTER — Encounter: Payer: Self-pay | Admitting: Internal Medicine

## 2017-03-12 VITALS — BP 124/62 | HR 63 | Ht 69.0 in | Wt 195.0 lb

## 2017-03-12 DIAGNOSIS — Z95810 Presence of automatic (implantable) cardiac defibrillator: Secondary | ICD-10-CM | POA: Diagnosis not present

## 2017-03-12 DIAGNOSIS — I498 Other specified cardiac arrhythmias: Secondary | ICD-10-CM | POA: Diagnosis not present

## 2017-03-12 DIAGNOSIS — I4901 Ventricular fibrillation: Secondary | ICD-10-CM

## 2017-03-12 DIAGNOSIS — I255 Ischemic cardiomyopathy: Secondary | ICD-10-CM

## 2017-03-12 DIAGNOSIS — I480 Paroxysmal atrial fibrillation: Secondary | ICD-10-CM

## 2017-03-12 LAB — CUP PACEART INCLINIC DEVICE CHECK
Brady Statistic RV Percent Paced: 0.29 %
Date Time Interrogation Session: 20181221090943
HIGH POWER IMPEDANCE MEASURED VALUE: 49.6985
Implantable Lead Model: 7121
Lead Channel Impedance Value: 487.5 Ohm
Lead Channel Pacing Threshold Amplitude: 1 V
Lead Channel Pacing Threshold Pulse Width: 0.5 ms
Lead Channel Pacing Threshold Pulse Width: 0.5 ms
Lead Channel Setting Pacing Amplitude: 2.5 V
Lead Channel Setting Pacing Pulse Width: 0.5 ms
MDC IDC LEAD IMPLANT DT: 20080527
MDC IDC LEAD LOCATION: 753860
MDC IDC MSMT BATTERY REMAINING LONGEVITY: 88 mo
MDC IDC MSMT LEADCHNL RV PACING THRESHOLD AMPLITUDE: 1 V
MDC IDC MSMT LEADCHNL RV SENSING INTR AMPL: 11.2 mV
MDC IDC PG IMPLANT DT: 20171201
MDC IDC PG SERIAL: 7283394
MDC IDC SET LEADCHNL RV SENSING SENSITIVITY: 0.5 mV

## 2017-03-12 NOTE — Patient Instructions (Signed)
Medication Instructions:  Your physician recommends that you continue on your current medications as directed. Please refer to the Current Medication list given to you today.  Labwork: None ordered.  Testing/Procedures: None ordered.  Follow-Up: Your physician wants you to follow-up in: one year with Dr. Lovena Le.   You will receive a reminder letter in the mail two months in advance. If you don't receive a letter, please call our office to schedule the follow-up appointment.  Remote monitoring is used to monitor your ICD from home. This monitoring reduces the number of office visits required to check your device to one time per year. It allows [place] to keep an eye on the functioning of your device to ensure it is working properly. You are scheduled for a device check from home on 05/26/2017. You may send your transmission at any time that day. If you have a wireless device, the transmission will be sent automatically. After your physician reviews your transmission, you will receive a postcard with your next transmission date.  Any Other Special Instructions Will Be Listed Below (If Applicable).  If you need a refill on your cardiac medications before your next appointment, please call your pharmacy.

## 2017-03-12 NOTE — Progress Notes (Signed)
HPI Mr. Lazarz returns today for ongoing followup of his ICD in the setting of Brugada syndrome, CAD, and HTN. In the interim, he has done well. He denies chest pain or sob. No syncope. No ICD shocks. He walks every day with no symptoms. Allergies  Allergen Reactions  . Lipitor [Atorvastatin] Rash     Current Outpatient Medications  Medication Sig Dispense Refill  . acetaminophen (TYLENOL) 500 MG tablet Take 1,000 mg by mouth every 6 (six) hours as needed (for pain/headache.).    Marland Kitchen ciclopirox (LOPROX) 0.77 % cream Apply 1 application topically 2 (two) times daily.    . fluticasone (FLOVENT HFA) 110 MCG/ACT inhaler Inhale 1 puff into the lungs 2 (two) times daily. 1 Inhaler 12  . glipiZIDE (GLUCOTROL) 5 MG tablet Take 1 tablet (5 mg total) by mouth 2 (two) times daily before a meal. MUST SCHEDULE ANNUAL EXAM 180 tablet 0  . levETIRAcetam (KEPPRA) 500 MG tablet Take 500-1,000 mg by mouth 2 (two) times daily. Take one tablet by mouth in the morning and two tablets by mouth at night    . losartan (COZAAR) 100 MG tablet TAKE 1 TABLET BY MOUTH EVERY DAY 30 tablet 5  . metoprolol tartrate (LOPRESSOR) 25 MG tablet Take 12.5 mg by mouth 2 (two) times daily.    . Multiple Vitamin (MULTIVITAMIN) tablet Take 1 tablet by mouth daily.      . naftifine (NAFTIN) 1 % cream Apply 1 application topically 2 (two) times daily as needed (For  infection between toes).     . Niacin, Antihyperlipidemic, 500 MG TABS Take 500 mg by mouth daily. 30 tablet 2  . NIFEdipine (PROCARDIA-XL/ADALAT CC) 30 MG 24 hr tablet Take 30 mg by mouth daily.    . Omega-3 Fatty Acids (FISH OIL) 1200 MG CAPS Take 1,200 mg by mouth daily.     . Rivaroxaban (XARELTO) 20 MG TABS tablet Take 20 mg by mouth daily at 2 PM.     . simvastatin (ZOCOR) 20 MG tablet Take 10 mg by mouth at bedtime.     . SYMBICORT 160-4.5 MCG/ACT inhaler INHALE 2 PUFFS INTO THE LUNGS 2 (TWO) TIMES DAILY. 10 Inhaler 0  . tamsulosin (FLOMAX) 0.4 MG CAPS  capsule Take 0.4 mg by mouth daily.  5   No current facility-administered medications for this visit.      Past Medical History:  Diagnosis Date  . Arthritis   . Atrial fibrillation ([place])   . Automatic implantable cardiac defibrillator in situ   . Borderline diabetes mellitus   . CAD (coronary artery disease)   . Colonic polyp 2004   hyperplastic   . COPD (chronic obstructive pulmonary disease) ([employer])   . Diabetes mellitus without complication ([place])   . GERD (gastroesophageal reflux disease)   . History of renal insufficiency syndrome   . HTN (hypertension)   . Hyperlipidemia   . Monoclonal gammopathy   . 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 Seizure disorder ([place])   . Stroke ([place])     ROS:   All systems reviewed and negative except as noted in the HPI.   Past Surgical History:  Procedure Laterality Date  . CARDIAC CATHETERIZATION  1987   Showed distal left circumflex 100% occluded   . CARDIAC DEFIBRILLATOR PLACEMENT  08/17/2006   Implantation of a St. Jude single chamber defibrillator  . EP IMPLANTABLE DEVICE N/A 02/21/2016   Procedure: ICD Generator Changeout;  Surgeon: Evans Lance, MD;  Location: [facility];  Service: Cardiovascular;  Laterality: N/A;  . INGUINAL HERNIA REPAIR Left      Family History  Problem Relation Age of Onset  . Colon cancer Brother   . Heart attack Mother   . Heart attack Brother      Social History   Socioeconomic History  . Marital status: Single    Spouse name: Not on file  . Number of children: 1  . Years of education: Not on file  . Highest education level: Not on file  Social Needs  . Financial resource strain: Not on file  . Food insecurity - worry: Not on file  . Food insecurity - inability: Not on file  . Transportation needs - medical: Not on file  . Transportation needs - non-medical: Not on file  Occupational History  . Occupation: retired    Fish farm manager: RETIRED  Tobacco Use  . Smoking  status: Former Smoker    Packs/day: 0.50    Years: 15.00    Pack years: 7.50    Types: Cigarettes    Last attempt to quit: 03/23/2004    Years since quitting: 12.9  . Smokeless tobacco: Never Used  Substance and Sexual Activity  . Alcohol use: Yes    Alcohol/week: 0.6 oz    Types: 1 Glasses of wine per week    Comment: rare--wine  . Drug use: No  . Sexual activity: Not on file  Other Topics Concern  . Not on file  Social History Narrative   ICD-St. Jude  Remote-Yes     BP 124/62   Pulse 63   Ht 5\' 9"  (1.753 m)   Wt 195 lb (88.5 kg)   SpO2 97%   BMI 28.80 kg/m   Physical Exam:  Well appearing 71 yo man, NAD HEENT: Unremarkable Neck:  6 cm JVD, no thyromegally Lymphatics:  No adenopathy Back:  No CVA tenderness Lungs:  Clear with no wheezes HEART:  Regular rate rhythm, no murmurs, no rubs, no clicks Abd:  soft, positive bowel sounds, no organomegally, no rebound, no guarding Ext:  2 plus pulses, no edema, no cyanosis, no clubbing Skin:  No rashes no nodules Neuro:  CN II through XII intact, motor grossly intact   DEVICE  Normal device function.  See PaceArt for details.   Assess/Plan: 1. Brugada syndrome - he has had no recurrent ventricular arrhythmias.  2. HTN - his blood pressure today is well controlled. Will follow. 3. CAD - he is active and denies anginal symptoms. Will follow. 4. ICD - his St. Jude device is working normally. Will recheck in several months.  [name].D.

## 2017-04-19 ENCOUNTER — Ambulatory Visit (INDEPENDENT_AMBULATORY_CARE_PROVIDER_SITE_OTHER): Payer: Medicare Other | Admitting: Internal Medicine

## 2017-04-19 ENCOUNTER — Encounter: Payer: Self-pay | Admitting: Internal Medicine

## 2017-04-19 VITALS — BP 122/76 | HR 88 | Temp 97.5°F | Ht 68.25 in | Wt 197.8 lb

## 2017-04-19 DIAGNOSIS — J411 Mucopurulent chronic bronchitis: Secondary | ICD-10-CM | POA: Diagnosis not present

## 2017-04-19 DIAGNOSIS — I482 Chronic atrial fibrillation: Secondary | ICD-10-CM | POA: Diagnosis not present

## 2017-04-19 DIAGNOSIS — M1990 Unspecified osteoarthritis, unspecified site: Secondary | ICD-10-CM

## 2017-04-19 DIAGNOSIS — R569 Unspecified convulsions: Secondary | ICD-10-CM

## 2017-04-19 DIAGNOSIS — K219 Gastro-esophageal reflux disease without esophagitis: Secondary | ICD-10-CM

## 2017-04-19 DIAGNOSIS — I4821 Permanent atrial fibrillation: Secondary | ICD-10-CM

## 2017-04-19 DIAGNOSIS — E1122 Type 2 diabetes mellitus with diabetic chronic kidney disease: Secondary | ICD-10-CM | POA: Diagnosis not present

## 2017-04-19 DIAGNOSIS — N181 Chronic kidney disease, stage 1: Secondary | ICD-10-CM | POA: Diagnosis not present

## 2017-04-19 DIAGNOSIS — R350 Frequency of micturition: Secondary | ICD-10-CM | POA: Diagnosis not present

## 2017-04-19 DIAGNOSIS — Z125 Encounter for screening for malignant neoplasm of prostate: Secondary | ICD-10-CM | POA: Diagnosis not present

## 2017-04-19 DIAGNOSIS — I10 Essential (primary) hypertension: Secondary | ICD-10-CM | POA: Diagnosis not present

## 2017-04-19 DIAGNOSIS — N401 Enlarged prostate with lower urinary tract symptoms: Secondary | ICD-10-CM | POA: Diagnosis not present

## 2017-04-19 DIAGNOSIS — Z0000 Encounter for general adult medical examination without abnormal findings: Secondary | ICD-10-CM

## 2017-04-19 DIAGNOSIS — E7800 Pure hypercholesterolemia, unspecified: Secondary | ICD-10-CM | POA: Diagnosis not present

## 2017-04-19 DIAGNOSIS — I2510 Atherosclerotic heart disease of native coronary artery without angina pectoris: Secondary | ICD-10-CM | POA: Diagnosis not present

## 2017-04-19 LAB — LIPID PANEL
Cholesterol: 106 mg/dL (ref 0–200)
HDL: 40.3 mg/dL (ref >39.00)
LDL CALC: 31 mg/dL (ref 0–99)
NonHDL: 65.86
Total CHOL/HDL Ratio: 3
Triglycerides: 175 mg/dL — ABNORMAL HIGH (ref 0.0–149.0)
VLDL: 35 mg/dL (ref 0.0–40.0)

## 2017-04-19 LAB — CBC
HCT: 40.8 % (ref 39.0–52.0)
HEMOGLOBIN: 13.7 g/dL (ref 13.0–17.0)
MCHC: 33.4 g/dL (ref 30.0–36.0)
MCV: 91.3 fl (ref 78.0–100.0)
Platelets: 191 10*3/uL (ref 150.0–400.0)
RBC: 4.47 Mil/uL (ref 4.22–5.81)
RDW: 12.8 % (ref 11.5–15.5)
WBC: 4.8 10*3/uL (ref 4.0–10.5)

## 2017-04-19 LAB — COMPREHENSIVE METABOLIC PANEL
ALK PHOS: 49 U/L (ref 39–117)
ALT: 16 U/L (ref 0–53)
AST: 12 U/L (ref 0–37)
Albumin: 4.1 g/dL (ref 3.5–5.2)
BUN: 17 mg/dL (ref 6–23)
CHLORIDE: 103 meq/L (ref 96–112)
CO2: 29 meq/L (ref 19–32)
Calcium: 9 mg/dL (ref 8.4–10.5)
Creatinine, Ser: 1.39 mg/dL (ref 0.40–1.50)
GFR: 65.11 mL/min (ref >60.00)
Glucose, Bld: 142 mg/dL — ABNORMAL HIGH (ref 70–99)
POTASSIUM: 4.2 meq/L (ref 3.5–5.1)
SODIUM: 139 meq/L (ref 135–145)
Total Bilirubin: 0.9 mg/dL (ref 0.2–1.2)
Total Protein: 7.1 g/dL (ref 6.0–8.3)

## 2017-04-19 LAB — HEMOGLOBIN A1C: Hgb A1c MFr Bld: 6.3 % (ref 4.6–6.5)

## 2017-04-19 LAB — PSA, MEDICARE: PSA: 0.72 ng/ml (ref 0.10–4.00)

## 2017-04-19 NOTE — Assessment & Plan Note (Signed)
Controlled on Nifedipine, Losartan and Metoprolol CBC and CMET today Reinforced DASH diet and exercise for weight loss Will monitor

## 2017-04-19 NOTE — Assessment & Plan Note (Signed)
Controlled on Flovent Will monitor

## 2017-04-19 NOTE — Assessment & Plan Note (Signed)
CMET and lipid profile today Encouraged him to consume a low fat diet Continue Simvastatin, Niacin and Fish Oil

## 2017-04-19 NOTE — Patient Instructions (Signed)

## 2017-04-19 NOTE — Assessment & Plan Note (Signed)
Continue Flomax Will monitor

## 2017-04-19 NOTE — Assessment & Plan Note (Signed)
Controlled on Nifedipine, Metoprolol and Xarelto He will continue to follow with cardiology

## 2017-04-19 NOTE — Assessment & Plan Note (Signed)
Not disabling Advised him to try Tylenol Arthritis as needed

## 2017-04-19 NOTE — Progress Notes (Signed)
HPI:  Pt presents to the clinic today for his Medicare Wellness Exam.  Arthritis: Mainly in his hands. He does not take anything OTC for this.  Afib: Controlled with Nifedipine, Xarelto and Metoprolol. He follows with Dr. Lovena Le, note from 02/2017 reviewed. ECG from 05/2016 reviewed.  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 He does not check his sugars. He takes Glipizide as prescribed. His last eye exam was in 2018. He does not check his feet daily.  HLD with CAD: His last LDL was 33. He is taking Simvastatin, Niacin and Fish Oil as prescribed. He tries to consume a low fat diet.  COPD: Controlled on Flovent. He does not follow with pulmonology.  GERD: Triggered by spicy and tomato based foods. He does not taking any RX or OTC medications for reflux.  HTN: His BP today is 122/76. He is taking Losartan daily as prescribed. ECG from 05/2016 reviewed.  BPH: Chronic frequency and nocturia. He is taking Flomax daily but does not think it helps.  Seizure Disorder: No recent seizures on Keppra.  History of Stroke: Residual memory issues but no weakness. He takes Losartan, Metoprolol, Xarelto and Simvastatin as prescribed.  Past Medical History:  Diagnosis Date  . Arthritis   . Atrial fibrillation ([place])   . Automatic implantable cardiac defibrillator in situ   . Borderline diabetes mellitus   . CAD (coronary artery disease)   . Colonic polyp 2004   hyperplastic   . COPD (chronic obstructive pulmonary disease) ([place])   . Diabetes mellitus without complication ([place])   . GERD (gastroesophageal reflux disease)   . History of renal insufficiency syndrome   . HTN (hypertension)   . Hyperlipidemia   . Monoclonal gammopathy   . 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 Seizure disorder ([place])   . Stroke [facility])     Current Outpatient Medications  Medication Sig Dispense Refill  . acetaminophen (TYLENOL) 500 MG tablet Take 1,000 mg by mouth every 6 (six) hours as  needed (for pain/headache.).    Marland Kitchen ciclopirox (LOPROX) 0.77 % cream Apply 1 application topically 2 (two) times daily.    . fluticasone (FLOVENT HFA) 110 MCG/ACT inhaler Inhale 1 puff into the lungs 2 (two) times daily. 1 Inhaler 12  . glipiZIDE (GLUCOTROL) 5 MG tablet Take 1 tablet (5 mg total) by mouth 2 (two) times daily before a meal. MUST SCHEDULE ANNUAL EXAM 180 tablet 0  . levETIRAcetam (KEPPRA) 500 MG tablet Take 500-1,000 mg by mouth 2 (two) times daily. Take one tablet by mouth in the morning and two tablets by mouth at night    . losartan (COZAAR) 100 MG tablet TAKE 1 TABLET BY MOUTH EVERY DAY 30 tablet 5  . metoprolol tartrate (LOPRESSOR) 25 MG tablet Take 12.5 mg by mouth 2 (two) times daily.    . Multiple Vitamin (MULTIVITAMIN) tablet Take 1 tablet by mouth daily.      . naftifine (NAFTIN) 1 % cream Apply 1 application topically 2 (two) times daily as needed (For  infection between toes).     . Niacin, Antihyperlipidemic, 500 MG TABS Take 500 mg by mouth daily. 30 tablet 2  . NIFEdipine (PROCARDIA-XL/ADALAT CC) 30 MG 24 hr tablet Take 30 mg by mouth daily.    . Omega-3 Fatty Acids (FISH OIL) 1200 MG CAPS Take 1,200 mg by mouth daily.     . Rivaroxaban (XARELTO) 20 MG TABS tablet Take 20 mg by mouth daily at 2 PM.     .  simvastatin (ZOCOR) 20 MG tablet Take 10 mg by mouth at bedtime.     . SYMBICORT 160-4.5 MCG/ACT inhaler INHALE 2 PUFFS INTO THE LUNGS 2 (TWO) TIMES DAILY. 10 Inhaler 0  . tamsulosin (FLOMAX) 0.4 MG CAPS capsule Take 0.4 mg by mouth daily.  5   No current facility-administered medications for this visit.     Allergies  Allergen Reactions  . Lipitor [Atorvastatin] Rash    Family History  Problem Relation Age of Onset  . Colon cancer Brother   . Heart attack Mother   . Heart attack Brother     Social History   Socioeconomic History  . Marital status: Single    Spouse name: Not on file  . Number of children: 1  . Years of education: Not on file  .  Highest education level: Not on file  Social Needs  . Financial resource strain: Not on file  . Food insecurity - worry: Not on file  . Food insecurity - inability: Not on file  . Transportation needs - medical: Not on file  . Transportation needs - non-medical: Not on file  Occupational History  . Occupation: retired    Fish farm manager: RETIRED  Tobacco Use  . Smoking status: Former Smoker    Packs/day: 0.50    Years: 15.00    Pack years: 7.50    Types: Cigarettes    Last attempt to quit: 03/23/2004    Years since quitting: 13.0  . Smokeless tobacco: Never Used  Substance and Sexual Activity  . Alcohol use: Yes    Alcohol/week: 0.6 oz    Types: 1 Glasses of wine per week    Comment: rare--wine  . Drug use: No  . Sexual activity: Not on file  Other Topics Concern  . Not on file  Social History Narrative   ICD-St. Jude  Remote-Yes    Hospitiliaztions: None  Health Maintenance:    Flu: 11/2016  Tetanus: 08/2013  Pneumovax: 08/2013  Prevnar: 12/2015  Zostavax: never  PSA: 03/2016  Colon Screening: 07/2013  Eye Doctor: annually  Dental Exam: biannually   Providers:   PCP: Webb Silversmith, NP-C  Cardiologist: Dr. Lovena Le    I have personally reviewed and have noted:  1. The patient's medical and social history 2. Their use of alcohol, tobacco or illicit drugs 3. Their current medications and supplements 4. The patient's functional ability including ADL's, fall risks, home safety risks and hearing or visual impairment. 5. Diet and physical activities 6. Evidence for depression or mood disorder  Subjective:   Review of Systems:   Constitutional: Denies fever, malaise, fatigue, headache or abrupt weight changes.  HEENT: Denies eye pain, eye redness, ear pain, ringing in the ears, wax buildup, runny nose, nasal congestion, bloody nose, or sore throat. Respiratory: Denies difficulty breathing, shortness of breath, cough or sputum production.   Cardiovascular: Denies chest  pain, chest tightness, palpitations or swelling in the hands or feet.  Gastrointestinal: Pt reports intermittent constipation. Denies abdominal pain, bloating, diarrhea or blood in the stool.  GU: Pt reports frequency and nocturia. Denies urgency, pain with urination, burning sensation, blood in urine, odor or discharge. Musculoskeletal: Denies decrease in range of motion, difficulty with gait, muscle pain or joint pain and swelling.  Skin: Denies redness, rashes, lesions or ulcercations.  Neurological: Pt reports trouble with memory. Denies dizziness, difficulty with speech or problems with balance and coordination.  Psych: Denies anxiety, depression, SI/HI.  No other specific complaints in a complete review of systems (  except as listed in HPI above).  Objective:  PE:   BP 122/76 (BP Location: Left Arm, Patient Position: Sitting, Cuff Size: Normal)   Pulse 88   Temp (!) 97.5 F (36.4 C) (Oral)   Ht 5' 8.25" (1.734 m)   Wt 197 lb 12 oz (89.7 kg)   SpO2 98%   BMI 29.85 kg/m   Wt Readings from Last 3 Encounters:  03/12/17 195 lb (88.5 kg)  02/05/17 195 lb 12.8 oz (88.8 kg)  05/22/16 197 lb (89.4 kg)    General: Appears his stated age, obese in NAD. Skin: Warm, dry and intact. No rashes, lesions or ulcerations noted. HEENT: Head: normal shape and size; Eyes: sclera white, no icterus, conjunctiva pink, PERRLA and EOMs intact; Ears: Tm's gray and intact, normal light reflex; Throat/Mouth: Teeth present, mucosa pink and moist, no exudate, lesions or ulcerations noted.  Neck: Neck supple, trachea midline. No masses, lumps or thyromegaly present.  Cardiovascular: Normal rate and rhythm. S1,S2 noted.  No murmur, rubs or gallops noted. No JVD or BLE edema. No carotid bruits noted. Pulmonary/Chest: Normal effort and positive vesicular breath sounds. No respiratory distress. No wheezes, rales or ronchi noted.  Abdomen: Soft and nontender. Normal bowel sounds. No distention or masses noted.  Liver, spleen and kidneys non palpable. Musculoskeletal: Normal range of motion. Strength 5/5 BUE/BLE.  Neurological: Alert and oriented. Obvious trouble with recall. Cranial nerves II-XII grossly intact. Coordination normal.  Psychiatric: Mood and affect normal. Behavior is normal. Judgment and thought content normal.    BMET    Component Value Date/Time   NA 139 04/07/2016 1503   NA 140 01/30/2016 1129   K 4.3 04/07/2016 1503   K 4.4 01/30/2016 1129   CL 103 04/07/2016 1503   CL 104 01/06/2012 1355   CO2 29 04/07/2016 1503   CO2 26 01/30/2016 1129   GLUCOSE 55 (L) 04/07/2016 1503   GLUCOSE 191 (H) 01/30/2016 1129   GLUCOSE 77 01/06/2012 1355   BUN 15 04/07/2016 1503   BUN 15.0 01/30/2016 1129   CREATININE 1.29 04/07/2016 1503   CREATININE 1.29 (H) 02/17/2016 1011   CREATININE 1.4 (H) 01/30/2016 1129   CALCIUM 9.6 04/07/2016 1503   CALCIUM 9.4 01/30/2016 1129   GFRNONAA 74.56 02/03/2010 1110   GFRAA 57 02/08/2008 1040    Lipid Panel     Component Value Date/Time   CHOL 140 04/07/2016 1503   TRIG 217.0 (H) 04/07/2016 1503   HDL 42.60 04/07/2016 1503   CHOLHDL 3 04/07/2016 1503   VLDL 43.4 (H) 04/07/2016 1503   LDLCALC 57 06/14/2013 1235    CBC    Component Value Date/Time   WBC 3.9 (L) 01/29/2017 0948   WBC 7.4 04/07/2016 1503   RBC 4.46 01/29/2017 0948   RBC 4.79 04/07/2016 1503   HGB 13.5 01/29/2017 0948   HCT 40.9 01/29/2017 0948   PLT 187 01/29/2017 0948   MCV 91.7 01/29/2017 0948   MCH 30.3 01/29/2017 0948   MCH 30.5 02/17/2016 1011   MCHC 33.0 01/29/2017 0948   MCHC 33.6 04/07/2016 1503   RDW 12.5 01/29/2017 0948   LYMPHSABS 1.1 01/29/2017 0948   MONOABS 0.2 01/29/2017 0948   EOSABS 0.1 01/29/2017 0948   BASOSABS 0.0 01/29/2017 0948    Hgb A1C Lab Results  Component Value Date   HGBA1C 6.5 04/07/2016      Assessment and Plan:   Medicare Annual Wellness Visit:  Diet: He does eat meat. He consumes fruits and veggies daily.  He does eat  some fried food. He drinks mostly water. Physical activity: He goes to the gym for 30 minutes 2-3 days per week Depression/mood screen: Negative Hearing: Intact to whispered voice Visual acuity: Grossly normal, performs annual eye exam  ADLs: Capable Fall risk: None Home safety: Good Cognitive evaluation: He has trouble with recall. MMSE 20/30 EOL planning: Adv directives, full code/ I agree  Preventative Medicine: Flu, tetanus, pneumovax, prevnar and zostovax UTD. He declines Shingrix. Colon screening UTD. Encouraged him to consume a balanced diet and exercise regimen. Advised him to see an eye doctor and dentist annually. Will check CBC, CMET, Lipid, A1C and PSA today.   Next appointment: 1 year, Medicare Wellness Exam   Webb Silversmith, NP

## 2017-04-19 NOTE — Assessment & Plan Note (Signed)
A1C today No microalbumin secondary to ARB therapy Encouraged him to consume a low carb diet and exercise for weight loss Continue Glipizide Foot exam today Encouraged yearly eye exams Immunizations UTD

## 2017-04-19 NOTE — Assessment & Plan Note (Signed)
None on Keppra Will monitor

## 2017-04-19 NOTE — Assessment & Plan Note (Signed)
Currently not an issue Will monitor 

## 2017-04-19 NOTE — Assessment & Plan Note (Signed)
No angina Encouraged him to consume a low fat diet and increase aerobic exercise Continue Simvastatin, Niacin, Fish Oil and Metoprolol

## 2017-05-26 ENCOUNTER — Ambulatory Visit (INDEPENDENT_AMBULATORY_CARE_PROVIDER_SITE_OTHER): Payer: Medicare Other | Admitting: *Deleted

## 2017-05-26 DIAGNOSIS — I4901 Ventricular fibrillation: Secondary | ICD-10-CM

## 2017-05-26 NOTE — Progress Notes (Signed)
Remote ICD transmission.   

## 2017-05-27 ENCOUNTER — Encounter: Payer: Self-pay | Admitting: Cardiology

## 2017-06-01 LAB — CUP PACEART REMOTE DEVICE CHECK
Implantable Lead Location: 753860
Implantable Pulse Generator Implant Date: 20171201
MDC IDC LEAD IMPLANT DT: 20080527
MDC IDC SESS DTM: 20190312201915
Pulse Gen Serial Number: 7283394

## 2017-08-25 ENCOUNTER — Ambulatory Visit (INDEPENDENT_AMBULATORY_CARE_PROVIDER_SITE_OTHER): Payer: Medicare Other | Admitting: *Deleted

## 2017-08-25 ENCOUNTER — Telehealth: Payer: Self-pay | Admitting: Internal Medicine

## 2017-08-25 ENCOUNTER — Telehealth: Payer: Self-pay | Admitting: Cardiology

## 2017-08-25 DIAGNOSIS — I255 Ischemic cardiomyopathy: Secondary | ICD-10-CM

## 2017-08-25 NOTE — Telephone Encounter (Signed)
Spoke w/ pt and informed him that his remote transmission was not received. Pt is going to try to send remote transmission again later today.

## 2017-08-25 NOTE — Telephone Encounter (Signed)
NEW MESSAGE     1. Has your device fired? NO  2. Is you device beeping? NO  3. Are you experiencing draining or swelling at device site? NO  4. Are you calling to see if we received your device transmission? YES 5. Have you passed out? NO    Please route to [place]

## 2017-08-25 NOTE — Telephone Encounter (Signed)
Follow up    Patient is returning call in reference to whether or not his transmission was received.    1. Has your device fired? no  2. Is you device beeping? no  3. Are you experiencing draining or swelling at device site? no  4. Are you calling to see if we received your device transmission? yes  5. Have you passed out? No     Please route to [place]

## 2017-08-25 NOTE — Telephone Encounter (Signed)
Spoke with pt and reminded pt of remote transmission that is due today. Pt verbalized understanding.   

## 2017-08-25 NOTE — Telephone Encounter (Signed)
LMOM to return call.

## 2017-08-26 ENCOUNTER — Encounter: Payer: Self-pay | Admitting: Cardiology

## 2017-08-26 NOTE — Progress Notes (Signed)
Remote ICD transmission.   

## 2017-10-14 LAB — CUP PACEART REMOTE DEVICE CHECK
Battery Remaining Percentage: 81 %
Battery Voltage: 2.99 V
Brady Statistic RV Percent Paced: 1 %
Date Time Interrogation Session: 20190606105644
HIGH POWER IMPEDANCE MEASURED VALUE: 48 Ohm
HighPow Impedance: 48 Ohm
Implantable Lead Implant Date: 20080527
Implantable Lead Location: 753860
Implantable Pulse Generator Implant Date: 20171201
Lead Channel Impedance Value: 430 Ohm
Lead Channel Pacing Threshold Amplitude: 1 V
Lead Channel Pacing Threshold Pulse Width: 0.5 ms
Lead Channel Sensing Intrinsic Amplitude: 10.2 mV
Lead Channel Setting Pacing Amplitude: 2.5 V
Lead Channel Setting Pacing Pulse Width: 0.5 ms
MDC IDC MSMT BATTERY REMAINING LONGEVITY: 83 mo
MDC IDC SET LEADCHNL RV SENSING SENSITIVITY: 0.5 mV
Pulse Gen Serial Number: 7283394

## 2017-10-27 DIAGNOSIS — N2581 Secondary hyperparathyroidism of renal origin: Secondary | ICD-10-CM | POA: Diagnosis not present

## 2017-10-27 DIAGNOSIS — N182 Chronic kidney disease, stage 2 (mild): Secondary | ICD-10-CM | POA: Diagnosis not present

## 2017-10-27 DIAGNOSIS — D649 Anemia, unspecified: Secondary | ICD-10-CM | POA: Diagnosis not present

## 2017-10-27 DIAGNOSIS — I480 Paroxysmal atrial fibrillation: Secondary | ICD-10-CM | POA: Diagnosis not present

## 2017-11-24 ENCOUNTER — Encounter: Payer: Medicare Other | Admitting: *Deleted

## 2017-11-24 ENCOUNTER — Telehealth: Payer: Self-pay | Admitting: Cardiology

## 2017-11-24 NOTE — Telephone Encounter (Signed)
LMOVM reminding pt to send remote transmission.   

## 2017-11-26 ENCOUNTER — Encounter: Payer: Self-pay | Admitting: Cardiology

## 2017-11-29 ENCOUNTER — Ambulatory Visit (INDEPENDENT_AMBULATORY_CARE_PROVIDER_SITE_OTHER): Payer: Medicare Other | Admitting: *Deleted

## 2017-11-29 DIAGNOSIS — I255 Ischemic cardiomyopathy: Secondary | ICD-10-CM | POA: Diagnosis not present

## 2017-11-30 ENCOUNTER — Encounter: Payer: Self-pay | Admitting: Cardiology

## 2017-11-30 NOTE — Progress Notes (Signed)
Remote ICD transmission.   

## 2017-12-21 LAB — CUP PACEART REMOTE DEVICE CHECK
Battery Remaining Longevity: 82 mo
Battery Remaining Percentage: 79 %
Date Time Interrogation Session: 20190907101837
HIGH POWER IMPEDANCE MEASURED VALUE: 48 Ohm
HighPow Impedance: 48 Ohm
Implantable Lead Model: 7121
Lead Channel Impedance Value: 490 Ohm
Lead Channel Pacing Threshold Pulse Width: 0.5 ms
Lead Channel Setting Pacing Amplitude: 2.5 V
Lead Channel Setting Pacing Pulse Width: 0.5 ms
MDC IDC LEAD IMPLANT DT: 20080527
MDC IDC LEAD LOCATION: 753860
MDC IDC MSMT BATTERY VOLTAGE: 2.99 V
MDC IDC MSMT LEADCHNL RV PACING THRESHOLD AMPLITUDE: 1 V
MDC IDC MSMT LEADCHNL RV SENSING INTR AMPL: 11.4 mV
MDC IDC PG IMPLANT DT: 20171201
MDC IDC PG SERIAL: 7283394
MDC IDC SET LEADCHNL RV SENSING SENSITIVITY: 0.5 mV
MDC IDC STAT BRADY RV PERCENT PACED: 1 %

## 2018-02-03 DIAGNOSIS — R350 Frequency of micturition: Secondary | ICD-10-CM | POA: Diagnosis not present

## 2018-02-04 ENCOUNTER — Inpatient Hospital Stay: Payer: Medicare Other | Attending: Oncology

## 2018-02-04 DIAGNOSIS — N289 Disorder of kidney and ureter, unspecified: Secondary | ICD-10-CM | POA: Diagnosis not present

## 2018-02-04 DIAGNOSIS — D472 Monoclonal gammopathy: Secondary | ICD-10-CM | POA: Diagnosis not present

## 2018-02-04 LAB — COMPREHENSIVE METABOLIC PANEL
ALBUMIN: 4 g/dL (ref 3.5–5.0)
ALT: 24 U/L (ref 0–44)
AST: 17 U/L (ref 15–41)
Alkaline Phosphatase: 60 U/L (ref 38–126)
Anion gap: 7 (ref 5–15)
BUN: 18 mg/dL (ref 8–23)
CHLORIDE: 106 mmol/L (ref 98–111)
CO2: 25 mmol/L (ref 22–32)
Calcium: 9.5 mg/dL (ref 8.9–10.3)
Creatinine, Ser: 1.56 mg/dL — ABNORMAL HIGH (ref 0.61–1.24)
GFR calc Af Amer: 50 mL/min — ABNORMAL LOW (ref >60)
GFR calc non Af Amer: 43 mL/min — ABNORMAL LOW (ref >60)
GLUCOSE: 137 mg/dL — AB (ref 70–99)
POTASSIUM: 4.5 mmol/L (ref 3.5–5.1)
SODIUM: 138 mmol/L (ref 135–145)
Total Bilirubin: 1.2 mg/dL (ref 0.3–1.2)
Total Protein: 7.5 g/dL (ref 6.5–8.1)

## 2018-02-04 LAB — CBC WITH DIFFERENTIAL/PLATELET
ABS IMMATURE GRANULOCYTES: 0 10*3/uL (ref 0.00–0.07)
BASOS PCT: 1 %
Basophils Absolute: 0 10*3/uL (ref 0.0–0.1)
EOS PCT: 2 %
Eosinophils Absolute: 0.1 10*3/uL (ref 0.0–0.5)
HCT: 41.5 % (ref 39.0–52.0)
Hemoglobin: 13.6 g/dL (ref 13.0–17.0)
Immature Granulocytes: 0 %
Lymphocytes Relative: 19 %
Lymphs Abs: 0.9 10*3/uL (ref 0.7–4.0)
MCH: 30.3 pg (ref 26.0–34.0)
MCHC: 32.8 g/dL (ref 30.0–36.0)
MCV: 92.4 fL (ref 80.0–100.0)
Monocytes Absolute: 0.3 10*3/uL (ref 0.1–1.0)
Monocytes Relative: 6 %
NEUTROS ABS: 3.7 10*3/uL (ref 1.7–7.7)
Neutrophils Relative %: 72 %
PLATELETS: 183 10*3/uL (ref 150–400)
RBC: 4.49 MIL/uL (ref 4.22–5.81)
RDW: 12.1 % (ref 11.5–15.5)
WBC: 5 10*3/uL (ref 4.0–10.5)
nRBC: 0 % (ref 0.0–0.2)

## 2018-02-07 LAB — KAPPA/LAMBDA LIGHT CHAINS
Kappa free light chain: 110.9 mg/L — ABNORMAL HIGH (ref 3.3–19.4)
Kappa, lambda light chain ratio: 15.19 — ABNORMAL HIGH (ref 0.26–1.65)
Lambda free light chains: 7.3 mg/L (ref 5.7–26.3)

## 2018-02-08 LAB — MULTIPLE MYELOMA PANEL, SERUM
ALBUMIN SERPL ELPH-MCNC: 3.9 g/dL (ref 2.9–4.4)
ALBUMIN/GLOB SERPL: 1.4 (ref 0.7–1.7)
ALPHA 1: 0.2 g/dL (ref 0.0–0.4)
ALPHA2 GLOB SERPL ELPH-MCNC: 0.6 g/dL (ref 0.4–1.0)
B-GLOBULIN SERPL ELPH-MCNC: 0.8 g/dL (ref 0.7–1.3)
GAMMA GLOB SERPL ELPH-MCNC: 1.4 g/dL (ref 0.4–1.8)
GLOBULIN, TOTAL: 3 g/dL (ref 2.2–3.9)
IGA: 32 mg/dL — AB (ref 61–437)
IGG (IMMUNOGLOBIN G), SERUM: 1493 mg/dL (ref 700–1600)
IgM (Immunoglobulin M), Srm: 13 mg/dL — ABNORMAL LOW (ref 20–172)
M PROTEIN SERPL ELPH-MCNC: 1 g/dL — AB
Total Protein ELP: 6.9 g/dL (ref 6.0–8.5)

## 2018-02-11 ENCOUNTER — Inpatient Hospital Stay (HOSPITAL_BASED_OUTPATIENT_CLINIC_OR_DEPARTMENT_OTHER): Payer: Medicare Other | Admitting: Oncology

## 2018-02-11 ENCOUNTER — Telehealth: Payer: Self-pay | Admitting: Oncology

## 2018-02-11 VITALS — BP 125/75 | HR 60 | Temp 97.5°F | Resp 17 | Ht 68.25 in | Wt 192.3 lb

## 2018-02-11 DIAGNOSIS — D472 Monoclonal gammopathy: Secondary | ICD-10-CM | POA: Diagnosis not present

## 2018-02-11 DIAGNOSIS — N289 Disorder of kidney and ureter, unspecified: Secondary | ICD-10-CM

## 2018-02-11 NOTE — Progress Notes (Signed)
Hematology and Oncology Follow Up Visit  Brad Brown [id number] Oct 05, 1945 72 y.o. 02/11/2018 9:46 AM   Principle Diagnosis: 72 year old man with IgG kappa MGUS diagnosed in 2008.  He presented with an M spike of 1 g/dL with normal IgG level.   Current therapy: Active surveillance.  Interim History:  [name] is here for repeat evaluation.  Since the last visit, he reports no major changes in his health.  He continues to enjoy excellent quality of life and performance status.  He does not report any recent hospitalizations, illnesses or excessive fatigue or tiredness.  He denies any cardiac complications or arrhythmia.  He denies any bone pain or recurrent infections.  Kidney function remains stable without any recent urinary issues.   He does not report any headaches or blurry vision or syncope.  He denies any alteration mental status or confusion.  He does not report any fevers, chills, sweats or weight loss. He does not report any chest pain, palpitation or orthopnea. He does not report leg edema cough or shortness of breath. He does not report any nausea, vomiting or abdominal distention.  Denies any changes in bowel habits.  He does not report any bleeding or clotting tendency.  He does not report any frequency urgency, dysuria or hematuria.  He denies any bone pain or pathological fractures.  Remaining review of systems negative.   Medications: I have reviewed the patient's current medications.  Current Outpatient Medications  Medication Sig Dispense Refill  . acetaminophen (TYLENOL) 500 MG tablet Take 1,000 mg by mouth every 6 (six) hours as needed (for pain/headache.).    Marland Kitchen ciclopirox (LOPROX) 0.77 % cream Apply 1 application topically 2 (two) times daily.    . fluticasone (FLOVENT HFA) 110 MCG/ACT inhaler Inhale 1 puff into the lungs 2 (two) times daily. 1 Inhaler 12  . glipiZIDE (GLUCOTROL) 5 MG tablet Take 1 tablet (5 mg total) by mouth 2 (two) times daily before a meal.  MUST SCHEDULE ANNUAL EXAM 180 tablet 0  . levETIRAcetam (KEPPRA) 500 MG tablet Take 500-1,000 mg by mouth 2 (two) times daily. Take one tablet by mouth in the morning and two tablets by mouth at night    . losartan (COZAAR) 100 MG tablet TAKE 1 TABLET BY MOUTH EVERY DAY 30 tablet 5  . metoprolol tartrate (LOPRESSOR) 25 MG tablet Take 12.5 mg by mouth 2 (two) times daily.    . Multiple Vitamin (MULTIVITAMIN) tablet Take 1 tablet by mouth daily.      . naftifine (NAFTIN) 1 % cream Apply 1 application topically 2 (two) times daily as needed (For  infection between toes).     . Niacin, Antihyperlipidemic, 500 MG TABS Take 500 mg by mouth daily. 30 tablet 2  . NIFEdipine (PROCARDIA-XL/ADALAT CC) 30 MG 24 hr tablet Take 30 mg by mouth daily.    . Omega-3 Fatty Acids (FISH OIL) 1200 MG CAPS Take 1,200 mg by mouth daily.     . Rivaroxaban (XARELTO) 20 MG TABS tablet Take 20 mg by mouth daily at 2 PM.     . simvastatin (ZOCOR) 20 MG tablet Take 10 mg by mouth at bedtime.     . SYMBICORT 160-4.5 MCG/ACT inhaler INHALE 2 PUFFS INTO THE LUNGS 2 (TWO) TIMES DAILY. 10 Inhaler 0  . tamsulosin (FLOMAX) 0.4 MG CAPS capsule Take 0.4 mg by mouth daily.  5   No current facility-administered medications for this visit.     Allergies:  Allergies  Allergen Reactions  .  Lipitor [Atorvastatin] Rash    Past Medical History, Surgical history, Social history, and Family History were reviewed and updated.  Physical Exam: Blood pressure 125/75, pulse 60, temperature (!) 97.5 F (36.4 C), temperature source Oral, resp. rate 17, height 5' 8.25" (1.734 m), weight 192 lb 4.8 oz (87.2 kg), SpO2 99 %.   ECOG: 0   General appearance: Comfortable appearing without any discomfort Head: Normocephalic without any trauma Oropharynx: Mucous membranes are moist and pink without any thrush or ulcers. Eyes: Pupils are equal and round reactive to light. Lymph nodes: No cervical, supraclavicular, inguinal or axillary  lymphadenopathy.   Heart:regular rate and rhythm.  S1 and S2 without leg edema. Lung: Clear without any rhonchi or wheezes.  No dullness to percussion. Abdomin: Soft, nontender, nondistended with good bowel sounds.  No hepatosplenomegaly. Musculoskeletal: No joint deformity or effusion.  Full range of motion noted. Neurological: No deficits noted on motor, sensory and deep tendon reflex exam. Skin: No petechial rash or dryness.  Appeared moist.     Lab Results: Lab Results  Component Value Date   WBC 5.0 02/04/2018   HGB 13.6 02/04/2018   HCT 41.5 02/04/2018   MCV 92.4 02/04/2018   PLT 183 02/04/2018     Chemistry      Component Value Date/Time   NA 138 02/04/2018 0754   NA 140 01/30/2016 1129   K 4.5 02/04/2018 0754   K 4.4 01/30/2016 1129   CL 106 02/04/2018 0754   CL 104 01/06/2012 1355   CO2 25 02/04/2018 0754   CO2 26 01/30/2016 1129   BUN 18 02/04/2018 0754   BUN 15.0 01/30/2016 1129   CREATININE 1.56 (H) 02/04/2018 0754   CREATININE 1.29 (H) 02/17/2016 1011   CREATININE 1.4 (H) 01/30/2016 1129      Component Value Date/Time   CALCIUM 9.5 02/04/2018 0754   CALCIUM 9.4 01/30/2016 1129   ALKPHOS 60 02/04/2018 0754   ALKPHOS 78 01/30/2016 1129   AST 17 02/04/2018 0754   AST 20 01/30/2016 1129   ALT 24 02/04/2018 0754   ALT 28 01/30/2016 1129   BILITOT 1.2 02/04/2018 0754   BILITOT 0.82 01/30/2016 1129     Results for [name], [name] (MRN [id number]) as of 02/11/2018 09:47  Ref. Range 01/28/2015 12:38 01/30/2016 11:29 01/29/2017 09:47 02/04/2018 07:54  Kappa free light chain Latest Ref Range: 3.3 - 19.4 mg/L 8.31 (H)   110.9 (H)  Lambda Free Lght Chn Latest Ref Range: 0.57 - 2.63 mg/dL 0.46 (L)     Lamda free light chains Latest Ref Range: 5.7 - 26.3 mg/L    7.3  Kappa, lamda light chain ratio Latest Ref Range: 0.26 - 1.65     15.19 (H)  Kappa:Lambda Ratio Latest Ref Range: 0.26 - 1.65  18.07 (H)        Results for [name], [name] (MRN [id number]) as of  02/11/2018 09:47  Ref. Range 01/29/2017 09:47 02/04/2018 07:54  M Protein SerPl Elph-Mcnc Latest Ref Range: Not Observed g/dL 1.0 (H) 1.0 (H)     Impression and Plan:  72 year old man with the following:  1.  IgG kappa MGUS diagnosed in 2008.  He has been on active surveillance since that time without any evidence to suggest active myeloma or needing treatment.  Laboratory data obtained on February 04, 2018 were personally reviewed and discussed with the patient today.  His M spike remains unchanged around 1 g/dL with normal IgG level.  His serum light chain ratio has not  changed as well.  His disease status was updated today and indication for treatment and restaging purposes was discussed.  At this time I see no evidence to suggest symptomatic myeloma that requires treatment.  Treatment options for multiple myeloma were reviewed in case he develops this condition in the future  2.  Renal insufficiency: Chronic in nature and is unchanged.  Unrelated to a plasma cell disorder.  3.  Follow-up: We will be in 1 year for repeat laboratory testing and clinical follow-up.  15  minutes was spent with the patient face-to-face today.  More than 50% of time was dedicated to reviewing his disease status, laboratory data and assessing his risk of disease progression and treatment options.     Zola Button, MD 11/22/20199:46 AM

## 2018-02-11 NOTE — Telephone Encounter (Signed)
Gave pt avs and calendar  °

## 2018-02-28 ENCOUNTER — Ambulatory Visit (INDEPENDENT_AMBULATORY_CARE_PROVIDER_SITE_OTHER): Payer: Medicare Other

## 2018-02-28 DIAGNOSIS — I255 Ischemic cardiomyopathy: Secondary | ICD-10-CM

## 2018-03-01 NOTE — Progress Notes (Signed)
Remote ICD transmission.   

## 2018-04-16 LAB — CUP PACEART REMOTE DEVICE CHECK
Battery Remaining Percentage: 77 %
Battery Voltage: 2.99 V
Date Time Interrogation Session: 20191209160253
HIGH POWER IMPEDANCE MEASURED VALUE: 50 Ohm
HIGH POWER IMPEDANCE MEASURED VALUE: 50 Ohm
Implantable Lead Model: 7121
Lead Channel Impedance Value: 440 Ohm
Lead Channel Pacing Threshold Amplitude: 1 V
Lead Channel Pacing Threshold Pulse Width: 0.5 ms
Lead Channel Sensing Intrinsic Amplitude: 11.9 mV
Lead Channel Setting Pacing Amplitude: 2.5 V
Lead Channel Setting Pacing Pulse Width: 0.5 ms
MDC IDC LEAD IMPLANT DT: 20080527
MDC IDC LEAD LOCATION: 753860
MDC IDC MSMT BATTERY REMAINING LONGEVITY: 79 mo
MDC IDC PG IMPLANT DT: 20171201
MDC IDC PG SERIAL: 7283394
MDC IDC SET LEADCHNL RV SENSING SENSITIVITY: 0.5 mV
MDC IDC STAT BRADY RV PERCENT PACED: 1 %

## 2018-04-20 ENCOUNTER — Encounter: Payer: Medicare Other | Admitting: Internal Medicine

## 2018-04-21 ENCOUNTER — Encounter: Payer: Self-pay | Admitting: Internal Medicine

## 2018-04-21 ENCOUNTER — Ambulatory Visit (INDEPENDENT_AMBULATORY_CARE_PROVIDER_SITE_OTHER): Payer: Medicare Other | Admitting: Internal Medicine

## 2018-04-21 VITALS — BP 126/78 | HR 58 | Temp 97.7°F | Ht 68.25 in | Wt 192.0 lb

## 2018-04-21 DIAGNOSIS — R569 Unspecified convulsions: Secondary | ICD-10-CM

## 2018-04-21 DIAGNOSIS — Z789 Other specified health status: Secondary | ICD-10-CM | POA: Diagnosis not present

## 2018-04-21 DIAGNOSIS — I4811 Longstanding persistent atrial fibrillation: Secondary | ICD-10-CM

## 2018-04-21 DIAGNOSIS — E782 Mixed hyperlipidemia: Secondary | ICD-10-CM

## 2018-04-21 DIAGNOSIS — Z0000 Encounter for general adult medical examination without abnormal findings: Secondary | ICD-10-CM | POA: Diagnosis not present

## 2018-04-21 DIAGNOSIS — N401 Enlarged prostate with lower urinary tract symptoms: Secondary | ICD-10-CM

## 2018-04-21 DIAGNOSIS — Z125 Encounter for screening for malignant neoplasm of prostate: Secondary | ICD-10-CM

## 2018-04-21 DIAGNOSIS — Z1211 Encounter for screening for malignant neoplasm of colon: Secondary | ICD-10-CM

## 2018-04-21 DIAGNOSIS — I10 Essential (primary) hypertension: Secondary | ICD-10-CM | POA: Diagnosis not present

## 2018-04-21 DIAGNOSIS — I69311 Memory deficit following cerebral infarction: Secondary | ICD-10-CM

## 2018-04-21 DIAGNOSIS — R350 Frequency of micturition: Secondary | ICD-10-CM

## 2018-04-21 DIAGNOSIS — K219 Gastro-esophageal reflux disease without esophagitis: Secondary | ICD-10-CM

## 2018-04-21 DIAGNOSIS — M1990 Unspecified osteoarthritis, unspecified site: Secondary | ICD-10-CM

## 2018-04-21 DIAGNOSIS — E119 Type 2 diabetes mellitus without complications: Secondary | ICD-10-CM

## 2018-04-21 DIAGNOSIS — I6350 Cerebral infarction due to unspecified occlusion or stenosis of unspecified cerebral artery: Secondary | ICD-10-CM

## 2018-04-21 LAB — COMPREHENSIVE METABOLIC PANEL
ALT: 22 U/L (ref 0–53)
AST: 16 U/L (ref 0–37)
Albumin: 4.5 g/dL (ref 3.5–5.2)
Alkaline Phosphatase: 46 U/L (ref 39–117)
BILIRUBIN TOTAL: 1 mg/dL (ref 0.2–1.2)
BUN: 15 mg/dL (ref 6–23)
CO2: 30 meq/L (ref 19–32)
Calcium: 9.6 mg/dL (ref 8.4–10.5)
Chloride: 103 mEq/L (ref 96–112)
Creatinine, Ser: 1.42 mg/dL (ref 0.40–1.50)
GFR: 59.59 mL/min — ABNORMAL LOW (ref >60.00)
Glucose, Bld: 110 mg/dL — ABNORMAL HIGH (ref 70–99)
Potassium: 4.6 mEq/L (ref 3.5–5.1)
Sodium: 139 mEq/L (ref 135–145)
Total Protein: 7.3 g/dL (ref 6.0–8.3)

## 2018-04-21 LAB — CBC
HCT: 44.3 % (ref 39.0–52.0)
Hemoglobin: 14.6 g/dL (ref 13.0–17.0)
MCHC: 32.9 g/dL (ref 30.0–36.0)
MCV: 91.8 fl (ref 78.0–100.0)
Platelets: 194 10*3/uL (ref 150.0–400.0)
RBC: 4.82 Mil/uL (ref 4.22–5.81)
RDW: 13.1 % (ref 11.5–15.5)
WBC: 4.9 10*3/uL (ref 4.0–10.5)

## 2018-04-21 LAB — LIPID PANEL
CHOL/HDL RATIO: 3
Cholesterol: 124 mg/dL (ref 0–200)
HDL: 44.6 mg/dL (ref >39.00)
LDL Cholesterol: 47 mg/dL (ref 0–99)
NonHDL: 79.56
Triglycerides: 161 mg/dL — ABNORMAL HIGH (ref 0.0–149.0)
VLDL: 32.2 mg/dL (ref 0.0–40.0)

## 2018-04-21 LAB — PSA, MEDICARE: PSA: 0.85 ng/ml (ref 0.10–4.00)

## 2018-04-21 LAB — HEMOGLOBIN A1C: Hgb A1c MFr Bld: 6.1 % (ref 4.6–6.5)

## 2018-04-21 NOTE — Progress Notes (Signed)
HPI:  Pt presents to the clinic today for his Medicare Wellness Exam. He is also due to follow up chronic conditions.  Arthritis: Mainly in his hands.  He is not taking anything for arthritis at this time.  Afib: Controlled with Nifedipine, Metoprolol and Xarelto.  He follows with Dr. Lovena Le.  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  He is taking Glipizide as prescribed.  He does not monitor his sugars.  He denies signs and symptoms of hypoglycemia.  He does not check his feet routinely.  His last eye exam was in within the last year.  HLD with CAD: His last LDL was 33, triglycerides 175, 03/2017.  He denies myalgias on Simvastatin and Fish Oil.  He does not consume a low-fat diet.  COPD: He denies chronic cough or shortness of breath.  He is taking Symbicort as prescribed.  There are no PFTs on file.  GERD: Triggered by spicy and tomato paste foods.  He does not take any medication for this OTC.  There is no upper GI on file.  HTN: His BP today is 126/78.  He is taking Losartan, Metoprolol and Nifedipine as prescribed.  ECG from 05/2016 reviewed.  BPH: He reports urinary frequency and nocturia.  He Flomax as prescribed.  He is currently not seeing a urologist.  Seizure Disorder: He denies any recent seizures on Keppra.  He does not follow with neurology.  History of Stroke: With residual memory issues but no weakness.  He is taking Losartan, Metoprolol, Nifedipine, Xarelto and Simvastatin as prescribed.  He is not following with neurology  Past Medical History:  Diagnosis Date  . Arthritis   . Atrial fibrillation ([place])   . Automatic implantable cardiac defibrillator in situ   . Borderline diabetes mellitus   . CAD (coronary artery disease)   . Colonic polyp 2004   hyperplastic   . COPD (chronic obstructive pulmonary disease) ([place])   . Diabetes mellitus without complication ([place])   . GERD (gastroesophageal reflux disease)   . History of renal  insufficiency syndrome   . HTN (hypertension)   . Hyperlipidemia   . Monoclonal gammopathy   . 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 Seizure disorder ([place])   . Stroke [facility])     Current Outpatient Medications  Medication Sig Dispense Refill  . acetaminophen (TYLENOL) 500 MG tablet Take 1,000 mg by mouth every 6 (six) hours as needed (for pain/headache.).    Marland Kitchen ciclopirox (LOPROX) 0.77 % cream Apply 1 application topically 2 (two) times daily.    . fluticasone (FLOVENT HFA) 110 MCG/ACT inhaler Inhale 1 puff into the lungs 2 (two) times daily. 1 Inhaler 12  . glipiZIDE (GLUCOTROL) 5 MG tablet Take 1 tablet (5 mg total) by mouth 2 (two) times daily before a meal. MUST SCHEDULE ANNUAL EXAM 180 tablet 0  . levETIRAcetam (KEPPRA) 500 MG tablet Take 500-1,000 mg by mouth 2 (two) times daily. Take one tablet by mouth in the morning and two tablets by mouth at night    . losartan (COZAAR) 100 MG tablet TAKE 1 TABLET BY MOUTH EVERY DAY 30 tablet 5  . metoprolol tartrate (LOPRESSOR) 25 MG tablet Take 12.5 mg by mouth 2 (two) times daily.    . Multiple Vitamin (MULTIVITAMIN) tablet Take 1 tablet by mouth daily.      . naftifine (NAFTIN) 1 % cream Apply 1 application topically 2 (two) times daily as needed (For  infection  between toes).     . Niacin, Antihyperlipidemic, 500 MG TABS Take 500 mg by mouth daily. 30 tablet 2  . NIFEdipine (PROCARDIA-XL/ADALAT CC) 30 MG 24 hr tablet Take 30 mg by mouth daily.    . Omega-3 Fatty Acids (FISH OIL) 1200 MG CAPS Take 1,200 mg by mouth daily.     . Rivaroxaban (XARELTO) 20 MG TABS tablet Take 20 mg by mouth daily at 2 PM.     . simvastatin (ZOCOR) 20 MG tablet Take 10 mg by mouth at bedtime.     . SYMBICORT 160-4.5 MCG/ACT inhaler INHALE 2 PUFFS INTO THE LUNGS 2 (TWO) TIMES DAILY. 10 Inhaler 0  . tamsulosin (FLOMAX) 0.4 MG CAPS capsule Take 0.4 mg by mouth daily.  5   No current facility-administered medications for this visit.     Allergies   Allergen Reactions  . Lipitor [Atorvastatin] Rash    Family History  Problem Relation Age of Onset  . Colon cancer Brother   . Heart attack Mother   . Heart attack Brother     Social History   Socioeconomic History  . Marital status: Single    Spouse name: Not on file  . Number of children: 1  . Years of education: Not on file  . Highest education level: Not on file  Occupational History  . Occupation: retired    Fish farm manager: RETIRED  Social Needs  . Financial resource strain: Not on file  . Food insecurity:    Worry: Not on file    Inability: Not on file  . Transportation needs:    Medical: Not on file    Non-medical: Not on file  Tobacco Use  . Smoking status: Former Smoker    Packs/day: 0.50    Years: 15.00    Pack years: 7.50    Types: Cigarettes    Last attempt to quit: 03/23/2004    Years since quitting: 14.0  . Smokeless tobacco: Never Used  Substance and Sexual Activity  . Alcohol use: Yes    Alcohol/week: 1.0 standard drinks    Types: 1 Glasses of wine per week    Comment: rare--wine  . Drug use: No  . Sexual activity: Not on file  Lifestyle  . Physical activity:    Days per week: Not on file    Minutes per session: Not on file  . Stress: Not on file  Relationships  . Social connections:    Talks on phone: Not on file    Gets together: Not on file    Attends religious service: Not on file    Active member of club or organization: Not on file    Attends meetings of clubs or organizations: Not on file    Relationship status: Not on file  . Intimate partner violence:    Fear of current or ex partner: Not on file    Emotionally abused: Not on file    Physically abused: Not on file    Forced sexual activity: Not on file  Other Topics Concern  . Not on file  Social History Narrative   ICD-St. Jude  Remote-Yes    Hospitiliaztions: None  Health Maintenance:    Flu: 11/2017  Tetanus: 08/2013  Pneumovax: 08/2013  Prevnar: 12/2015  Zostavax:  Never  Shingrix: Never  PSA: 03/2017  Colon Screening: 08/2013  Eye Doctor: Annually  Dental Exam: Annually   Providers:   PCP: Webb Silversmith, NP-C  Cardiologist: Dr. Lovena Le  Oncologist: Dr. Alen Blew  Pulmonologist: Dr. Lenna Gilford  I have personally reviewed and have noted:  1. The patient's medical and social history 2. Their use of alcohol, tobacco or illicit drugs 3. Their current medications and supplements 4. The patient's functional ability including ADL's, fall risks, home safety risks and hearing or visual impairment. 5. Diet and physical activities 6. Evidence for depression or mood disorder  Subjective:   Review of Systems:   Constitutional: Denies fever, malaise, fatigue, headache or abrupt weight changes.  HEENT: Denies eye pain, eye redness, ear pain, ringing in the ears, wax buildup, runny nose, nasal congestion, bloody nose, or sore throat. Respiratory: Denies difficulty breathing, shortness of breath, cough or sputum production.   Cardiovascular: Denies chest pain, chest tightness, palpitations or swelling in the hands or feet.  Gastrointestinal: Denies abdominal pain, bloating, constipation, diarrhea or blood in the stool.  GU: Patient reports urinary frequency and nocturia.  Denies urgency,, pain with urination, burning sensation, blood in urine, odor or discharge. Musculoskeletal: Patient reports intermittent joint pain in hands.  Denies decrease in range of motion, difficulty with gait, muscle pain or joint swelling.  Skin: Denies redness, rashes, lesions or ulcercations.  Neurological: Patient reports difficulty with memory.  Denies dizziness,  difficulty with speech or problems with balance and coordination.  Psych: Denies anxiety, depression, SI/HI.  No other specific complaints in a complete review of systems (except as listed in HPI above).  Objective:  PE:   BP 126/78   Pulse (!) 58   Temp 97.7 F (36.5 C) (Oral)   Ht 5' 8.25" (1.734 m)   Wt 192 lb  (87.1 kg)   SpO2 97%   BMI 28.98 kg/m   Wt Readings from Last 3 Encounters:  02/11/18 192 lb 4.8 oz (87.2 kg)  04/19/17 197 lb 12 oz (89.7 kg)  03/12/17 195 lb (88.5 kg)    General: Appears his stated age, obese, in NAD. Skin: Warm, dry and intact.  HEENT: Head: normal shape and size; Eyes: sclera white, no icterus, conjunctiva pink, PERRLA and EOMs intact; Ears: Tm's gray and intact, normal light reflex; Throat/Mouth: Teeth present, mucosa pink and moist, no exudate, lesions or ulcerations noted.  Neck: Neck supple, trachea midline. No masses, lumps or thyromegaly present.  Cardiovascular: Normal rate and rhythm. S1,S2 noted.  No murmur, rubs or gallops noted. No JVD or BLE edema. No carotid bruits noted. Pulmonary/Chest: Normal effort and positive vesicular breath sounds. No respiratory distress. No wheezes, rales or ronchi noted.  Abdomen: Soft and nontender. Normal bowel sounds.  Ventral hernia noted. Liver, spleen and kidneys non palpable. Musculoskeletal: Strength 5/5 BUE/BLE. No difficulty with gait.  Neurological: Alert and oriented. Cranial nerves II-XII grossly intact. Coordination normal.  Psychiatric: Mood and affect normal. Behavior is normal. Judgment and thought content normal.     BMET    Component Value Date/Time   NA 138 02/04/2018 0754   NA 140 01/30/2016 1129   K 4.5 02/04/2018 0754   K 4.4 01/30/2016 1129   CL 106 02/04/2018 0754   CL 104 01/06/2012 1355   CO2 25 02/04/2018 0754   CO2 26 01/30/2016 1129   GLUCOSE 137 (H) 02/04/2018 0754   GLUCOSE 191 (H) 01/30/2016 1129   GLUCOSE 77 01/06/2012 1355   BUN 18 02/04/2018 0754   BUN 15.0 01/30/2016 1129   CREATININE 1.56 (H) 02/04/2018 0754   CREATININE 1.29 (H) 02/17/2016 1011   CREATININE 1.4 (H) 01/30/2016 1129   CALCIUM 9.5 02/04/2018 0754   CALCIUM 9.4 01/30/2016 1129   GFRNONAA  43 (L) 02/04/2018 0754   GFRAA 50 (L) 02/04/2018 0754    Lipid Panel     Component Value Date/Time   CHOL 106  04/19/2017 1457   TRIG 175.0 (H) 04/19/2017 1457   HDL 40.30 04/19/2017 1457   CHOLHDL 3 04/19/2017 1457   VLDL 35.0 04/19/2017 1457   LDLCALC 31 04/19/2017 1457    CBC    Component Value Date/Time   WBC 5.0 02/04/2018 0754   RBC 4.49 02/04/2018 0754   HGB 13.6 02/04/2018 0754   HGB 13.5 01/29/2017 0948   HCT 41.5 02/04/2018 0754   HCT 40.9 01/29/2017 0948   PLT 183 02/04/2018 0754   PLT 187 01/29/2017 0948   MCV 92.4 02/04/2018 0754   MCV 91.7 01/29/2017 0948   MCH 30.3 02/04/2018 0754   MCHC 32.8 02/04/2018 0754   RDW 12.1 02/04/2018 0754   RDW 12.5 01/29/2017 0948   LYMPHSABS 0.9 02/04/2018 0754   LYMPHSABS 1.1 01/29/2017 0948   MONOABS 0.3 02/04/2018 0754   MONOABS 0.2 01/29/2017 0948   EOSABS 0.1 02/04/2018 0754   EOSABS 0.1 01/29/2017 0948   BASOSABS 0.0 02/04/2018 0754   BASOSABS 0.0 01/29/2017 0948    Hgb A1C Lab Results  Component Value Date   HGBA1C 6.3 04/19/2017      Assessment and Plan:   Medicare Annual Wellness Visit:  Diet: He does eat meat.  He consumes fruits and vegetables daily.  He does eat some fried foods.  He drinks mostly coffee, water Physical activity: Sedentary Depression/mood screen: Negative Hearing: Intact to whispered voice Visual acuity: Grossly normal, performs annual eye exam  ADLs: Capable Fall risk: None Home safety: Good Cognitive evaluation: Intact to orientation, naming, recall and repetition EOL planning: No adv directives, full code/ I agree  Preventative Medicine: Flu, tetanus, Pneumovax and Prevnar up-to-date.  Advised him to get a shingles vaccine at the pharmacy.  Referral placed to GI for repeat screening colonoscopy.  Encouraged him to consume a balanced diet and exercise regimen.  Advised him to see an eye doctor and dentist annually.  Will check CBC, C met, lipid, A1c and PSA today.  Uncircumcised male:  Request referral to urology for circumcision Referral placed   Next appointment: 6 months,  follow-up DM 2   Webb Silversmith, NP

## 2018-04-21 NOTE — Patient Instructions (Signed)
Health Maintenance After Age 71 After age 71, you are at a higher risk for certain long-term diseases and infections as well as injuries from falls. Falls are a major cause of broken bones and head injuries in people who are older than age 71. Getting regular preventive care can help to keep you healthy and well. Preventive care includes getting regular testing and making lifestyle changes as recommended by your health care provider. Talk with your health care provider about:  Which screenings and tests you should have. A screening is a test that checks for a disease when you have no symptoms.  A diet and exercise plan that is right for you. What should I know about screenings and tests to prevent falls? Screening and testing are the best ways to find a health problem early. Early diagnosis and treatment give you the best chance of managing medical conditions that are common after age 71. Certain conditions and lifestyle choices may make you more likely to have a fall. Your health care provider may recommend:  Regular vision checks. Poor vision and conditions such as cataracts can make you more likely to have a fall. If you wear glasses, make sure to get your prescription updated if your vision changes.  Medicine review. Work with your health care provider to regularly review all of the medicines you are taking, including over-the-counter medicines. Ask your health care provider about any side effects that may make you more likely to have a fall. Tell your health care provider if any medicines that you take make you feel dizzy or sleepy.  Osteoporosis screening. Osteoporosis is a condition that causes the bones to get weaker. This can make the bones weak and cause them to break more easily.  Blood pressure screening. Blood pressure changes and medicines to control blood pressure can make you feel dizzy.  Strength and balance checks. Your health care provider may recommend certain tests to check your  strength and balance while standing, walking, or changing positions.  Foot health exam. Foot pain and numbness, as well as not wearing proper footwear, can make you more likely to have a fall.  Depression screening. You may be more likely to have a fall if you have a fear of falling, feel emotionally low, or feel unable to do activities that you used to do.  Alcohol use screening. Using too much alcohol can affect your balance and may make you more likely to have a fall. What actions can I take to lower my risk of falls? General instructions  Talk with your health care provider about your risks for falling. Tell your health care provider if: ? You fall. Be sure to tell your health care provider about all falls, even ones that seem minor. ? You feel dizzy, sleepy, or off-balance.  Take over-the-counter and prescription medicines only as told by your health care provider. These include any supplements.  Eat a healthy diet and maintain a healthy weight. A healthy diet includes low-fat dairy products, low-fat (lean) meats, and fiber from whole grains, beans, and lots of fruits and vegetables. Home safety  Remove any tripping hazards, such as rugs, cords, and clutter.  Install safety equipment such as grab bars in bathrooms and safety rails on stairs.  Keep rooms and walkways well-lit. Activity   Follow a regular exercise program to stay fit. This will help you maintain your balance. Ask your health care provider what types of exercise are appropriate for you.  If you need a cane or   walker, use it as recommended by your health care provider.  Wear supportive shoes that have nonskid soles. Lifestyle  Do not drink alcohol if your health care provider tells you not to drink.  If you drink alcohol, limit how much you have: ? 0-1 drink a day for women. ? 0-2 drinks a day for men.  Be aware of how much alcohol is in your drink. In the [place] one drink equals one typical bottle of beer (12  oz), one-half glass of wine (5 oz), or one shot of hard liquor (1 oz).  Do not use any products that contain nicotine or tobacco, such as cigarettes and e-cigarettes. If you need help quitting, ask your health care provider. Summary  Having a healthy lifestyle and getting preventive care can help to protect your health and wellness after age 71.  Screening and testing are the best way to find a health problem early and help you avoid having a fall. Early diagnosis and treatment give you the best chance for managing medical conditions that are more common for people who are older than age 71.  Falls are a major cause of broken bones and head injuries in people who are older than age 71. Take precautions to prevent a fall at home.  Work with your health care provider to learn what changes you can make to improve your health and wellness and to prevent falls. This information is not intended to replace advice given to you by your health care provider. Make sure you discuss any questions you have with your health care provider. Document Released: 01/20/2017 Document Revised: 01/20/2017 Document Reviewed: 01/20/2017 Elsevier Interactive Patient Education  2019 Elsevier Inc.  

## 2018-04-22 ENCOUNTER — Encounter: Payer: Self-pay | Admitting: Internal Medicine

## 2018-04-22 NOTE — Assessment & Plan Note (Signed)
Currently not an issue °We will monitor °

## 2018-04-22 NOTE — Assessment & Plan Note (Signed)
Controlled on Losartan, Nifedipine and Metoprolol Reinforced DASH diet and exercise for weight loss C met today 

## 2018-04-22 NOTE — Assessment & Plan Note (Signed)
Continue Flomax We will monitor

## 2018-04-22 NOTE — Assessment & Plan Note (Signed)
C met and lipid profile today Encouraged him to consume a low-fat diet Continue Simvastatin and fish oil for now

## 2018-04-22 NOTE — Assessment & Plan Note (Signed)
Continue Niifedipine, Metoprolol and Xarelto CBC and C met today He will continue to follow with cardiology

## 2018-04-22 NOTE — Assessment & Plan Note (Signed)
None on Keppra C met today We will monitor

## 2018-04-22 NOTE — Assessment & Plan Note (Signed)
With residual memory impairment Continue Simvastatin, Xarelto, Metoprolol, Losartan and Nifedipine CBC and C met today

## 2018-04-22 NOTE — Assessment & Plan Note (Signed)
CBC, C met and A1c today Encouraged him to consume a low carb diet and exercise for weight loss No microalbumin secondary to ARB therapy Continue Glipizide for now Foot exam today Encouraged yearly eye exam Flu and pneumonia vaccines up-to-date

## 2018-05-06 DIAGNOSIS — N471 Phimosis: Secondary | ICD-10-CM | POA: Diagnosis not present

## 2018-05-18 ENCOUNTER — Telehealth: Payer: Self-pay | Admitting: Internal Medicine

## 2018-05-18 NOTE — Telephone Encounter (Signed)
Patient with diagnosis of afib on xarelto for anticoagulation.    Procedure: Circumcision Date of procedure: TBD  crcl total body weight 50ml/min, adjusted body weight 71ml/min  CHADS2-VASc score of  6 (CHF, HTN, AGE, DM2, stroke/tia x 2, CAD, AGE, male)  Per office protocol, patient can hold Xareloto for 1 days prior to procedure.

## 2018-05-18 NOTE — Telephone Encounter (Signed)
   Primary Cardiologist:Gregg Lovena Le, MD  Chart reviewed as part of pre-operative protocol coverage. Because of Brad Brown's past medical history and time since last visit, he/she will require a follow-up visit in order to better assess preoperative cardiovascular risk.  Pre-op covering staff: - Please schedule appointment and call patient to inform them. - Please contact requesting surgeon's office via preferred method (i.e, phone, fax) to inform them of need for appointment prior to surgery.  If applicable, this message will also be routed to pharmacy pool and/or primary cardiologist for input on holding anticoagulant/antiplatelet agent as requested below so that this information is available at time of patient's appointment (Pt is on Xarelto for atrial fibrillation).   Lyda Jester, PA-C  05/18/2018, 9:07 AM

## 2018-05-18 NOTE — Telephone Encounter (Signed)
SPOKE WITH PT AND PT  WANTS TO CALL BACK  TOMORROW  AFTER HE LOOKS AT HIS CALENDAR SCHEDULE AND OTHER APPOINTMENTS.   THIS WILL ALLOW HIM TO SEE WHEN HE CAN MAKE AN APPOINTMENT FOR CLEARANCE.

## 2018-05-18 NOTE — Telephone Encounter (Signed)
New Message      [facility] Pre-operative Risk Assessment    Request for surgical clearance:  1. What type of surgery is being performed? Circumcision  2. When is this surgery scheduled? TBD  3. What type of clearance is required (medical clearance vs. Pharmacy clearance to hold med vs. Both)? Both  4. Are there any medications that need to be held prior to surgery and how long? Xeralto  5. Practice name and name of physician performing surgery? [facility] Dr. Louis Meckel  6. What is your office phone number [phone]   7.   What is your office fax number [phone]  8.   Anesthesia type (None, local, MAC, general) ? General    Andree Coss 05/18/2018, 8:44 AM  _________________________________________________________________   (provider comments below)

## 2018-05-19 ENCOUNTER — Telehealth: Payer: Self-pay | Admitting: Internal Medicine

## 2018-05-19 NOTE — Telephone Encounter (Signed)
This patient called to schedule a pre op clearance appointment with Dr. Lovena Le.

## 2018-05-20 NOTE — Telephone Encounter (Signed)
Forwarded to requesting party via EPIC fax function 

## 2018-05-20 NOTE — Telephone Encounter (Signed)
Called pt and scheduled appt for 06-06-2018 @330pm  pt will arrive early at 320pm

## 2018-05-24 ENCOUNTER — Ambulatory Visit: Payer: Medicare Other | Admitting: Gastroenterology

## 2018-05-26 ENCOUNTER — Ambulatory Visit: Payer: Medicare Other | Admitting: Gastroenterology

## 2018-05-30 ENCOUNTER — Telehealth: Payer: Self-pay | Admitting: Internal Medicine

## 2018-05-30 ENCOUNTER — Ambulatory Visit (INDEPENDENT_AMBULATORY_CARE_PROVIDER_SITE_OTHER): Payer: Medicare Other | Admitting: *Deleted

## 2018-05-30 DIAGNOSIS — I255 Ischemic cardiomyopathy: Secondary | ICD-10-CM | POA: Diagnosis not present

## 2018-05-30 DIAGNOSIS — R55 Syncope and collapse: Secondary | ICD-10-CM

## 2018-05-30 NOTE — Telephone Encounter (Signed)
° ° °  1. Has your device fired? NO  2. Is you device beeping? NO  3. Are you experiencing draining or swelling at device site? NO  4. Are you calling to see if we received your device transmission? YES  5. Have you passed out? NO      Please route to Device Clinic Pool  

## 2018-05-30 NOTE — Telephone Encounter (Signed)
Spoke w/ pt and informed him that his remote transmission was received.

## 2018-05-31 LAB — CUP PACEART REMOTE DEVICE CHECK
Battery Remaining Percentage: 76 %
Battery Voltage: 2.99 V
Brady Statistic RV Percent Paced: 1 %
Date Time Interrogation Session: 20200309124709
HighPow Impedance: 52 Ohm
HighPow Impedance: 52 Ohm
Implantable Lead Implant Date: 20080527
Implantable Lead Location: 753860
Implantable Lead Model: 7121
Implantable Pulse Generator Implant Date: 20171201
Lead Channel Pacing Threshold Amplitude: 1 V
Lead Channel Pacing Threshold Pulse Width: 0.5 ms
Lead Channel Sensing Intrinsic Amplitude: 12 mV
Lead Channel Setting Pacing Amplitude: 2.5 V
Lead Channel Setting Pacing Pulse Width: 0.5 ms
Lead Channel Setting Sensing Sensitivity: 0.5 mV
MDC IDC MSMT BATTERY REMAINING LONGEVITY: 79 mo
MDC IDC MSMT LEADCHNL RV IMPEDANCE VALUE: 530 Ohm
Pulse Gen Serial Number: 7283394

## 2018-06-03 ENCOUNTER — Encounter: Payer: Self-pay | Admitting: Internal Medicine

## 2018-06-03 ENCOUNTER — Ambulatory Visit: Payer: Medicare Other | Admitting: Internal Medicine

## 2018-06-03 ENCOUNTER — Encounter (INDEPENDENT_AMBULATORY_CARE_PROVIDER_SITE_OTHER): Payer: Self-pay

## 2018-06-03 ENCOUNTER — Other Ambulatory Visit: Payer: Self-pay

## 2018-06-03 VITALS — BP 130/74 | HR 59 | Ht 68.0 in | Wt 193.2 lb

## 2018-06-03 DIAGNOSIS — I498 Other specified cardiac arrhythmias: Secondary | ICD-10-CM | POA: Diagnosis not present

## 2018-06-03 DIAGNOSIS — Z95810 Presence of automatic (implantable) cardiac defibrillator: Secondary | ICD-10-CM

## 2018-06-03 DIAGNOSIS — I10 Essential (primary) hypertension: Secondary | ICD-10-CM | POA: Diagnosis not present

## 2018-06-03 NOTE — Patient Instructions (Signed)
Medication Instructions:  Your physician recommends that you continue on your current medications as directed. Please refer to the Current Medication list given to you today.  Labwork: None ordered.  Testing/Procedures: None ordered.  Follow-Up: Your physician wants you to follow-up in: one year with Dr. Lovena Le.   You will receive a reminder letter in the mail two months in advance. If you don't receive a letter, please call our office to schedule the follow-up appointment.  Remote monitoring is used to monitor your ICD from home. This monitoring reduces the number of office visits required to check your device to one time per year. It allows [place] to keep an eye on the functioning of your device to ensure it is working properly. You are scheduled for a device check from home on 09/05/2018. You may send your transmission at any time that day. If you have a wireless device, the transmission will be sent automatically. After your physician reviews your transmission, you will receive a postcard with your next transmission date.  Any Other Special Instructions Will Be Listed Below (If Applicable).  If you need a refill on your cardiac medications before your next appointment, please call your pharmacy.

## 2018-06-03 NOTE — Progress Notes (Signed)
HPI [name] returns today for ongoing followup and preoperative evaluation. He is a pleasant 73 yo man with Brugada syndrome,s/p ICD insertion. He is pending circumcision. He denies chest pain or sob. He remains active. No other complaints.  Allergies  Allergen Reactions  . Lipitor [Atorvastatin] Rash     Current Outpatient Medications  Medication Sig Dispense Refill  . acetaminophen (TYLENOL) 500 MG tablet Take 1,000 mg by mouth every 6 (six) hours as needed (for pain/headache.).    Marland Kitchen ciclopirox (LOPROX) 0.77 % cream Apply 1 application topically 2 (two) times daily.    . fluticasone (FLOVENT HFA) 110 MCG/ACT inhaler Inhale 1 puff into the lungs 2 (two) times daily. 1 Inhaler 12  . glipiZIDE (GLUCOTROL) 5 MG tablet Take 1 tablet (5 mg total) by mouth 2 (two) times daily before a meal. MUST SCHEDULE ANNUAL EXAM 180 tablet 0  . levETIRAcetam (KEPPRA) 500 MG tablet Take 500-1,000 mg by mouth 2 (two) times daily. Take one tablet by mouth in the morning and two tablets by mouth at night    . losartan (COZAAR) 100 MG tablet TAKE 1 TABLET BY MOUTH EVERY DAY 30 tablet 5  . metoprolol tartrate (LOPRESSOR) 25 MG tablet Take 12.5 mg by mouth 2 (two) times daily.    . Multiple Vitamin (MULTIVITAMIN) tablet Take 1 tablet by mouth daily.      . naftifine (NAFTIN) 1 % cream Apply 1 application topically 2 (two) times daily as needed (For  infection between toes).     . Niacin, Antihyperlipidemic, 500 MG TABS Take 500 mg by mouth daily. 30 tablet 2  . NIFEdipine (PROCARDIA-XL/ADALAT CC) 30 MG 24 hr tablet Take 30 mg by mouth daily.    . Omega-3 Fatty Acids (FISH OIL) 1200 MG CAPS Take 1,200 mg by mouth daily.     . Rivaroxaban (XARELTO) 20 MG TABS tablet Take 20 mg by mouth daily at 2 PM.     . simvastatin (ZOCOR) 20 MG tablet Take 10 mg by mouth at bedtime.     . SYMBICORT 160-4.5 MCG/ACT inhaler INHALE 2 PUFFS INTO THE LUNGS 2 (TWO) TIMES DAILY. 10 Inhaler 0  . tamsulosin (FLOMAX) 0.4 MG  CAPS capsule Take 0.4 mg by mouth daily.  5   No current facility-administered medications for this visit.      Past Medical History:  Diagnosis Date  . Arthritis   . Atrial fibrillation ([place])   . Automatic implantable cardiac defibrillator in situ   . Borderline diabetes mellitus   . CAD (coronary artery disease)   . Colonic polyp 2004   hyperplastic   . COPD (chronic obstructive pulmonary disease) ([place])   . Diabetes mellitus without complication ([place])   . GERD (gastroesophageal reflux disease)   . History of renal insufficiency syndrome   . HTN (hypertension)   . Hyperlipidemia   . Monoclonal gammopathy   . 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 Seizure disorder ([place])   . Stroke ([place])     ROS:   All systems reviewed and negative except as noted in the HPI.   Past Surgical History:  Procedure Laterality Date  . CARDIAC CATHETERIZATION  1987   Showed distal left circumflex 100% occluded   . CARDIAC DEFIBRILLATOR PLACEMENT  08/17/2006   Implantation of a St. Jude single chamber defibrillator  . EP IMPLANTABLE DEVICE N/A 02/21/2016   Procedure: ICD Generator Changeout;  Surgeon: Brad Lance, MD;  Location: [place] [facility];  Service: Cardiovascular;  Laterality: N/A;  . INGUINAL HERNIA REPAIR Left      Family History  Problem Relation Age of Onset  . Colon cancer Brother   . Heart attack Mother   . Heart attack Brother      Social History   Socioeconomic History  . Marital status: Single    Spouse name: Not on file  . Number of children: 1  . Years of education: Not on file  . Highest education level: Not on file  Occupational History  . Occupation: retired    Fish farm manager: RETIRED  Social Needs  . Financial resource strain: Not on file  . Food insecurity:    Worry: Not on file    Inability: Not on file  . Transportation needs:    Medical: Not on file    Non-medical: Not on file  Tobacco Use  . Smoking status: Former Smoker     Packs/day: 0.50    Years: 15.00    Pack years: 7.50    Types: Cigarettes    Last attempt to quit: 03/23/2004    Years since quitting: 14.2  . Smokeless tobacco: Never Used  Substance and Sexual Activity  . Alcohol use: Yes    Alcohol/week: 1.0 standard drinks    Types: 1 Glasses of wine per week    Comment: rare--wine  . Drug use: No  . Sexual activity: Not on file  Lifestyle  . Physical activity:    Days per week: Not on file    Minutes per session: Not on file  . Stress: Not on file  Relationships  . Social connections:    Talks on phone: Not on file    Gets together: Not on file    Attends religious service: Not on file    Active member of club or organization: Not on file    Attends meetings of clubs or organizations: Not on file    Relationship status: Not on file  . Intimate partner violence:    Fear of current or ex partner: Not on file    Emotionally abused: Not on file    Physically abused: Not on file    Forced sexual activity: Not on file  Other Topics Concern  . Not on file  Social History Narrative   ICD-St. Jude  Remote-Yes     BP 130/74   Pulse (!) 59   Ht 5\' 8"  (1.727 m)   Wt 193 lb 3.2 oz (87.6 kg)   SpO2 98%   BMI 29.38 kg/m   Physical Exam:  Well appearing NAD HEENT: Unremarkable Neck:  No JVD, no thyromegally Lymphatics:  No adenopathy Back:  No CVA tenderness Lungs:  Clear with no wheezes HEART:  Regular rate rhythm, no murmurs, no rubs, no clicks Abd:  soft, positive bowel sounds, no organomegally, no rebound, no guarding Ext:  2 plus pulses, no edema, no cyanosis, no clubbing Skin:  No rashes no nodules Neuro:  CN II through XII intact, motor grossly intact  EKG - nsr with rightward axis  DEVICE  Normal device function.  See PaceArt for details.   Assess/Plan: 1. Preoperative evaluation - he is low risk for major cardiac complications from his circumcision.  2. ICD - his St. Jude device is working normally.  3. VTVF - he has  had no recurrent ventricular arrhythmias.  4. CAD - he denies anginal symptoms we will follow.  Brad Brown.D.

## 2018-06-05 NOTE — Progress Notes (Deleted)
Cardiology Office Note   Date:  06/05/2018   ID:  Aidden, Markovic 1945/11/11, MRN [id number]  PCP:  Jearld Fenton, NP  Cardiologist: Dr. Lovena Le No chief complaint on file.    History of Present Illness: Brad Brown is a 73 y.o. male who presents for follow up with diagnosis of Brugada syndrome, s/p ICD insertion. He was seen by Dr Lovena Le on 06/03/2018 for pre-operative evaluation for circumcision. He was found not to have any ICD discharges, no anginal symptoms and was found to be a low risk to proceed.   He has other history of atrial fibrillation on Xarelto, CAD, HTN and HL.   Past Medical History:  Diagnosis Date  . Arthritis   . Atrial fibrillation ([place])   . Automatic implantable cardiac defibrillator in situ   . Borderline diabetes mellitus   . CAD (coronary artery disease)   . Colonic polyp 2004   hyperplastic   . COPD (chronic obstructive pulmonary disease) ([place])   . Diabetes mellitus without complication ([place])   . GERD (gastroesophageal reflux disease)   . History of renal insufficiency syndrome   . HTN (hypertension)   . Hyperlipidemia   . Monoclonal gammopathy   . 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 Seizure disorder ([place])   . Stroke [facility])     Past Surgical History:  Procedure Laterality Date  . CARDIAC CATHETERIZATION  1987   Showed distal left circumflex 100% occluded   . CARDIAC DEFIBRILLATOR PLACEMENT  08/17/2006   Implantation of a St. Jude single chamber defibrillator  . EP IMPLANTABLE DEVICE N/A 02/21/2016   Procedure: ICD Generator Changeout;  Surgeon: Evans Lance, MD;  Location: [place] [facility];  Service: Cardiovascular;  Laterality: N/A;  . INGUINAL HERNIA REPAIR Left      Current Outpatient Medications  Medication Sig Dispense Refill  . acetaminophen (TYLENOL) 500 MG tablet Take 1,000 mg by mouth every 6 (six) hours as needed (for pain/headache.).    Marland Kitchen ciclopirox (LOPROX) 0.77 % cream Apply 1 application  topically 2 (two) times daily.    . fluticasone (FLOVENT HFA) 110 MCG/ACT inhaler Inhale 1 puff into the lungs 2 (two) times daily. 1 Inhaler 12  . glipiZIDE (GLUCOTROL) 5 MG tablet Take 1 tablet (5 mg total) by mouth 2 (two) times daily before a meal. MUST SCHEDULE ANNUAL EXAM 180 tablet 0  . levETIRAcetam (KEPPRA) 500 MG tablet Take 500-1,000 mg by mouth 2 (two) times daily. Take one tablet by mouth in the morning and two tablets by mouth at night    . losartan (COZAAR) 100 MG tablet TAKE 1 TABLET BY MOUTH EVERY DAY 30 tablet 5  . metoprolol tartrate (LOPRESSOR) 25 MG tablet Take 12.5 mg by mouth 2 (two) times daily.    . Multiple Vitamin (MULTIVITAMIN) tablet Take 1 tablet by mouth daily.      . naftifine (NAFTIN) 1 % cream Apply 1 application topically 2 (two) times daily as needed (For  infection between toes).     . Niacin, Antihyperlipidemic, 500 MG TABS Take 500 mg by mouth daily. 30 tablet 2  . NIFEdipine (PROCARDIA-XL/ADALAT CC) 30 MG 24 hr tablet Take 30 mg by mouth daily.    . Omega-3 Fatty Acids (FISH OIL) 1200 MG CAPS Take 1,200 mg by mouth daily.     . Rivaroxaban (XARELTO) 20 MG TABS tablet Take 20 mg by mouth daily at 2 PM.     . simvastatin (ZOCOR) 20 MG tablet  Take 10 mg by mouth at bedtime.     . SYMBICORT 160-4.5 MCG/ACT inhaler INHALE 2 PUFFS INTO THE LUNGS 2 (TWO) TIMES DAILY. 10 Inhaler 0  . tamsulosin (FLOMAX) 0.4 MG CAPS capsule Take 0.4 mg by mouth daily.  5   No current facility-administered medications for this visit.     Allergies:   Lipitor [atorvastatin]    Social History:  The patient  reports that he quit smoking about 14 years ago. His smoking use included cigarettes. He has a 7.50 pack-year smoking history. He has never used smokeless tobacco. He reports current alcohol use of about 1.0 standard drinks of alcohol per week. He reports that he does not use drugs.   Family History:  The patient's family history includes Colon cancer in his brother; Heart  attack in his brother and mother.    ROS: All other systems are reviewed and negative. Unless otherwise mentioned in H&P    PHYSICAL EXAM: VS:  There were no vitals taken for this visit. , BMI There is no height or weight on file to calculate BMI. GEN: Well nourished, well developed, in no acute distress HEENT: normal Neck: no JVD, carotid bruits, or masses Cardiac: ***RRR; no murmurs, rubs, or gallops,no edema  Respiratory:  Clear to auscultation bilaterally, normal work of breathing GI: soft, nontender, nondistended, + BS MS: no deformity or atrophy Skin: warm and dry, no rash Neuro:  Strength and sensation are intact Psych: euthymic mood, full affect   EKG:  EKG {ACTION; IS/IS [id number]} ordered today. The ekg ordered today demonstrates ***   Recent Labs: 04/21/2018: ALT 22; BUN 15; Creatinine, Ser 1.42; Hemoglobin 14.6; Platelets 194.0; Potassium 4.6; Sodium 139    Lipid Panel    Component Value Date/Time   CHOL 124 04/21/2018 0942   TRIG 161.0 (H) 04/21/2018 0942   HDL 44.60 04/21/2018 0942   CHOLHDL 3 04/21/2018 0942   VLDL 32.2 04/21/2018 0942   LDLCALC 47 04/21/2018 0942   LDLDIRECT 33.0 04/07/2016 1503      Wt Readings from Last 3 Encounters:  06/03/18 193 lb 3.2 oz (87.6 kg)  04/21/18 192 lb (87.1 kg)  02/11/18 192 lb 4.8 oz (87.2 kg)      Other studies Reviewed:   ASSESSMENT AND PLAN:  1.  ***   Current medicines are reviewed at length with the patient today.    Labs/ tests ordered today include: *** Phill Myron. [place] Pugh, ANP, AACC   06/05/2018 3:51 PM    [facility] [place] [address] Office [phone] Fax [phone]

## 2018-06-06 ENCOUNTER — Telehealth: Payer: Self-pay

## 2018-06-06 ENCOUNTER — Ambulatory Visit: Payer: Medicare Other | Admitting: Adult Health

## 2018-06-06 NOTE — Telephone Encounter (Signed)
Left message on voicemail for pt to cancel appt and to get more info of concerns to see if Brad Brown can make recommendations w/o pt needing to be evaluated.Marland KitchenMarland Kitchen

## 2018-06-06 NOTE — Telephone Encounter (Signed)
Called sister-in-law Rawleigh Rode she states that she will try to contact pt and let him know to call back ASAP

## 2018-06-06 NOTE — Telephone Encounter (Signed)
PT HAS APPT SCHEDULED TODAY HE IS ALREADY CLEARED FOR HIS SURGERY.   PER NOTE DATED 06-03-2018:   Assess/Plan: 1. Preoperative evaluation - he is low risk for major cardiac complications from his circumcision.  2. ICD - his St. Jude device is working normally.  3. VTVF - he has had no recurrent ventricular arrhythmias.  4. CAD - he denies anginal symptoms we will follow.  PER [facility]:  patient can hold Xarelto for 1 days prior to procedure

## 2018-06-06 NOTE — Telephone Encounter (Signed)
° °  Patient returning call, nurse unavailable Patient advised he is already cleared for his procedure, no need for appt today.  Advised patient nurse would return call to provide any other clinical instructions

## 2018-06-07 LAB — CUP PACEART INCLINIC DEVICE CHECK
Date Time Interrogation Session: 20200313174146
HIGH POWER IMPEDANCE MEASURED VALUE: 49.2188
Implantable Lead Implant Date: 20080527
Implantable Lead Model: 7121
Implantable Pulse Generator Implant Date: 20171201
Lead Channel Impedance Value: 487.5 Ohm
Lead Channel Pacing Threshold Amplitude: 1.25 V
Lead Channel Pacing Threshold Amplitude: 1.25 V
Lead Channel Pacing Threshold Pulse Width: 0.5 ms
Lead Channel Pacing Threshold Pulse Width: 0.5 ms
Lead Channel Sensing Intrinsic Amplitude: 10.1 mV
Lead Channel Setting Pacing Amplitude: 2.5 V
Lead Channel Setting Pacing Pulse Width: 0.5 ms
MDC IDC LEAD LOCATION: 753860
MDC IDC MSMT BATTERY REMAINING LONGEVITY: 80 mo
MDC IDC PG SERIAL: 7283394
MDC IDC SET LEADCHNL RV SENSING SENSITIVITY: 0.5 mV
MDC IDC STAT BRADY RV PERCENT PACED: 0.6 %

## 2018-06-07 NOTE — Progress Notes (Signed)
Remote ICD transmission.   

## 2018-06-09 ENCOUNTER — Ambulatory Visit: Payer: Medicare Other | Admitting: Internal Medicine

## 2018-06-13 ENCOUNTER — Encounter: Payer: Self-pay | Admitting: Internal Medicine

## 2018-06-17 ENCOUNTER — Ambulatory Visit: Payer: Medicare Other | Admitting: Gastroenterology

## 2018-08-23 DIAGNOSIS — N185 Chronic kidney disease, stage 5: Secondary | ICD-10-CM | POA: Diagnosis not present

## 2018-08-23 DIAGNOSIS — I480 Paroxysmal atrial fibrillation: Secondary | ICD-10-CM | POA: Diagnosis not present

## 2018-08-23 DIAGNOSIS — I129 Hypertensive chronic kidney disease with stage 1 through stage 4 chronic kidney disease, or unspecified chronic kidney disease: Secondary | ICD-10-CM | POA: Diagnosis not present

## 2018-08-23 DIAGNOSIS — N182 Chronic kidney disease, stage 2 (mild): Secondary | ICD-10-CM | POA: Diagnosis not present

## 2018-08-23 DIAGNOSIS — D472 Monoclonal gammopathy: Secondary | ICD-10-CM | POA: Diagnosis not present

## 2018-08-30 ENCOUNTER — Ambulatory Visit (INDEPENDENT_AMBULATORY_CARE_PROVIDER_SITE_OTHER): Payer: Medicare Other | Admitting: *Deleted

## 2018-08-30 DIAGNOSIS — R55 Syncope and collapse: Secondary | ICD-10-CM | POA: Diagnosis not present

## 2018-08-30 DIAGNOSIS — I255 Ischemic cardiomyopathy: Secondary | ICD-10-CM

## 2018-08-30 LAB — CUP PACEART REMOTE DEVICE CHECK
Battery Remaining Longevity: 77 mo
Battery Remaining Percentage: 74 %
Battery Voltage: 2.98 V
Brady Statistic RV Percent Paced: 1 %
Date Time Interrogation Session: 20200609080639
HighPow Impedance: 50 Ohm
HighPow Impedance: 50 Ohm
Implantable Lead Implant Date: 20080527
Implantable Lead Location: 753860
Implantable Lead Model: 7121
Implantable Pulse Generator Implant Date: 20171201
Lead Channel Impedance Value: 490 Ohm
Lead Channel Pacing Threshold Amplitude: 1.25 V
Lead Channel Pacing Threshold Pulse Width: 0.5 ms
Lead Channel Sensing Intrinsic Amplitude: 11 mV
Lead Channel Setting Pacing Amplitude: 2.5 V
Lead Channel Setting Pacing Pulse Width: 0.5 ms
Lead Channel Setting Sensing Sensitivity: 0.5 mV
Pulse Gen Serial Number: 7283394

## 2018-09-08 NOTE — Progress Notes (Signed)
Remote ICD transmission.   

## 2018-09-13 ENCOUNTER — Encounter: Payer: Self-pay | Admitting: Internal Medicine

## 2018-09-13 ENCOUNTER — Ambulatory Visit: Payer: Medicare Other | Admitting: Internal Medicine

## 2018-09-13 ENCOUNTER — Ambulatory Visit (INDEPENDENT_AMBULATORY_CARE_PROVIDER_SITE_OTHER): Payer: Medicare Other | Admitting: Internal Medicine

## 2018-09-13 ENCOUNTER — Telehealth: Payer: Self-pay

## 2018-09-13 ENCOUNTER — Other Ambulatory Visit: Payer: Self-pay

## 2018-09-13 VITALS — BP 128/78 | HR 64 | Temp 97.8°F | Wt 194.0 lb

## 2018-09-13 DIAGNOSIS — S40812A Abrasion of left upper arm, initial encounter: Secondary | ICD-10-CM | POA: Diagnosis not present

## 2018-09-13 DIAGNOSIS — B356 Tinea cruris: Secondary | ICD-10-CM | POA: Diagnosis not present

## 2018-09-13 MED ORDER — CLOTRIMAZOLE 1 % EX OINT: 1.0000 "application " | TOPICAL_OINTMENT | Freq: Every day | CUTANEOUS | 0 refills | Status: DC

## 2018-09-13 NOTE — Patient Instructions (Signed)
Intertrigo  Intertrigo is skin irritation (inflammation) that happens in warm, moist areas of the body. The irritation can cause a rash and make skin raw and itchy. The rash is usually pink or red. It happens mostly between folds of skin or where skin rubs together, such as:   Between the toes.   In the armpits.   In the groin area.   Under the belly.   Under the breasts.   Around the butt area.  This condition is not passed from person to person (is not contagious).  What are the causes?   Heat, moisture, rubbing, and not enough air movement.   The condition can be made worse by:  ? Sweat.  ? Bacteria.  ? A fungus, such as yeast.  What increases the risk?   Moisture in your skin folds.   You are more likely to develop this condition if you:  ? Have diabetes.  ? Are overweight.  ? Are not able to move around.  ? Live in a warm and moist climate.  ? Wear splints, braces, or other medical devices.  ? Are not able to control your pee (urine) or poop (stool).  What are the signs or symptoms?   A pink or red skin rash in the skin fold or near the skin fold.   Raw or scaly skin.   Itching.   A burning feeling.   Bleeding.   Leaking fluid.   A bad smell.  How is this treated?   Cleaning and drying your skin.   Taking an antibiotic medicine or using an antibiotic skin cream for a bacterial infection.   Using an antifungal cream on your skin or taking pills for an infection that was caused by a fungus, such as yeast.   Using a steroid ointment to stop the itching and irritation.   Separating the skin fold with a clean cotton cloth to absorb moisture and allow air to flow into the area.  Follow these instructions at home:   Keep the affected area clean and dry.   Do not scratch your skin.   Stay cool as much as you can. Use an air conditioner or a fan, if you have one.   Apply over-the-counter and prescription medicines only as told by your doctor.   If you were prescribed an antibiotic medicine,  use it as told by your doctor. Do not stop using the antibiotic even if your condition starts to get better.   Keep all follow-up visits as told by your doctor. This is important.  How is this prevented?     Stay at a healthy weight.   Take care of your feet. This is very important if you have diabetes. You should:  ? Wear shoes that fit well.  ? Keep your feet dry.  ? Wear clean cotton or wool socks.   Protect the skin in your groin and butt area as told by your doctor. To do this:  ? Follow a regular cleaning routine.  ? Use creams, powders, or ointments that protect your skin.  ? Change protection pads often.   Do not wear tight clothes. Wear clothes that:  ? Are loose.  ? Take moisture away from your body.  ? Are made of cotton.   Wear a bra that gives good support, if needed.   Shower and dry yourself well after being active. Use a hair dryer on a cool setting to dry between skin folds.   Keep your blood   sugar under control if you have diabetes.  Contact a doctor if:   Your symptoms do not get better with treatment.   Your symptoms get worse or they spread.   You notice more redness and warmth.   You have a fever.  Summary   Intertrigo is skin irritation that occurs when folds of skin rub together.   This condition is caused by heat, moisture, and rubbing.   This condition may be treated by cleaning and drying your skin and with medicines.   Apply over-the-counter and prescription medicines only as told by your doctor.   Keep all follow-up visits as told by your doctor. This is important.  This information is not intended to replace advice given to you by your health care provider. Make sure you discuss any questions you have with your health care provider.  Document Released: 04/11/2010 Document Revised: 08/09/2017 Document Reviewed: 09/10/2014  Elsevier Interactive Patient Education  2019 [employer]

## 2018-09-13 NOTE — Telephone Encounter (Signed)
Pt left v/m requesting cb; that was entire message. I spoke with pt and pt already has appt in office today at 11:45 with Avie Echevaria NP. nothing further needed.

## 2018-09-13 NOTE — Progress Notes (Signed)
Subjective:    Patient ID: Brad Brown, male    DOB: [birth year], 73 y.o.   MRN: [id number]  HPI  Patient presents to the clinic today with c/oof a scab on his left arm.  He noticed this 3 days ago.  He thinks he may have cut his arm on a ladder, while working outside. He is on Xarelto.  He denies itching, pain, or persistent bleeding from area.  He has not tried any OTC treatments for this. Tdap 08/2013.  He also complains of a rash on his right groin that started "a few weeks ago."  He says it does not itch but is slightly tender to touch.  He has tried using Triamcinolone cream which helped a little.  He states the rash is almost gone now, that it looks "like a scar" now.  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     ciclopirox (LOPROX) 0.77 % cream Apply 1 application topically 2 (two) times daily.     fluticasone (FLOVENT HFA) 110 MCG/ACT inhaler Inhale 1 puff into the lungs 2 (two) times daily. 1 Inhaler 12   glipiZIDE (GLUCOTROL) 5 MG tablet Take 1 tablet (5 mg total) by mouth 2 (two) times daily before a meal. MUST SCHEDULE ANNUAL EXAM 180 tablet 0   levETIRAcetam (KEPPRA) 500 MG tablet Take 500-1,000 mg  by mouth 2 (two) times daily. Take one tablet by mouth in the morning and two tablets by mouth at night     losartan (COZAAR) 100 MG tablet TAKE 1 TABLET BY MOUTH EVERY DAY 30 tablet 5   metoprolol tartrate (LOPRESSOR) 25 MG tablet Take 12.5 mg by mouth 2 (two) times daily.     Multiple Vitamin (MULTIVITAMIN) tablet Take 1 tablet by mouth daily.       naftifine (NAFTIN) 1 % cream Apply 1 application topically 2 (two) times daily as needed (For  infection between toes).      Niacin, Antihyperlipidemic, 500 MG TABS Take 500 mg by mouth daily. 30 tablet 2   NIFEdipine (PROCARDIA-XL/ADALAT CC) 30 MG 24 hr tablet Take 30 mg by mouth daily.     Omega-3 Fatty Acids (FISH OIL) 1200 MG CAPS Take 1,200 mg by mouth daily.      Rivaroxaban (XARELTO) 20 MG TABS tablet Take 20 mg by mouth daily at 2 PM.      simvastatin (ZOCOR) 20 MG tablet Take 10 mg by mouth at bedtime.      SYMBICORT 160-4.5 MCG/ACT inhaler INHALE 2 PUFFS INTO THE LUNGS 2 (TWO) TIMES DAILY. 10 Inhaler 0   tamsulosin (FLOMAX) 0.4 MG CAPS capsule Take 0.4 mg by mouth daily.  5   No current facility-administered medications for this visit.     Allergies  Allergen Reactions   Lipitor [Atorvastatin] Rash    Family History  Problem Relation Age of Onset   Colon cancer Brother    Heart attack Mother    Heart attack Brother     Social History   Socioeconomic History   Marital status: Single    Spouse name: Not on file   Number of children: 1   Years of education: Not on file   Highest education level: Not on file  Occupational History   Occupation: retired    Fish farm manager: RETIRED  Scientist, product/process development strain: Not on file   Food insecurity    Worry: Not on file    Inability: Not on file   Transportation needs    Medical: Not on file    Non-medical: Not on file  Tobacco Use   Smoking status: Former Smoker    Packs/day: 0.50    Years: 15.00    Pack years: 7.50    Types: Cigarettes     Quit date: 03/23/2004    Years since quitting: 14.4   Smokeless tobacco: Never Used  Substance and Sexual Activity   Alcohol use: Yes    Alcohol/week: 1.0 standard drinks    Types: 1 Glasses of wine per week    Comment: rare--wine   Drug use: No   Sexual activity: Not on file  Lifestyle   Physical activity    Days per week: Not on file    Minutes per session: Not on file   Stress: Not on file  Relationships   Social connections    Talks on phone: Not on file    Gets together: Not on file    Attends religious service: Not on file    Active member of club or organization: Not on file    Attends meetings of clubs or organizations: Not on file    Relationship status: Not on file   Intimate partner violence    Fear of current or ex partner: Not on file    Emotionally abused: Not on file    Physically abused: Not on file    Forced sexual activity: Not on file  Other Topics Concern   Not on file  Social History Narrative   ICD-St. Jude  Remote-Yes     Constitutional: Denies fever, malaise, fatigue, headache or abrupt weight changes.  Skin: Patient reports scab to left arm.  Also reports rash to right groin that is resolving.  Denies lesions or ulcercations.   No other specific complaints in a complete review of systems (except as listed in HPI above).  Objective:   Physical Exam  BP 128/78    Pulse 64    Temp 97.8 F (36.6 C) (Oral)    Wt 194 lb (88 kg)    SpO2 98%    BMI 29.50 kg/m   Wt Readings from Last 3 Encounters:  06/03/18 193 lb 3.2 oz (87.6 kg)  04/21/18 192 lb (87.1 kg)  02/11/18 192 lb 4.8 oz (87.2 kg)    General: Appears his stated age,obese in NAD. Skin: Small,nontender scabbed area to left posterior forearm with surrounding ecchymosis.  Right groin has a cluster of patchy, dry, erythematous lesions.   BMET    Component Value Date/Time   NA 139 04/21/2018 0942   NA 140 01/30/2016 1129   K 4.6 04/21/2018 0942   K 4.4 01/30/2016 1129   CL 103  04/21/2018 0942   CL 104 01/06/2012 1355   CO2 30 04/21/2018 0942   CO2 26 01/30/2016 1129   GLUCOSE 110 (H) 04/21/2018 0942   GLUCOSE 191 (H) 01/30/2016 1129   GLUCOSE 77 01/06/2012 1355   BUN 15 04/21/2018 0942   BUN 15.0 01/30/2016 1129   CREATININE 1.42 04/21/2018 0942   CREATININE 1.29 (H) 02/17/2016 1011   CREATININE 1.4 (H) 01/30/2016 1129   CALCIUM 9.6 04/21/2018 0942   CALCIUM 9.4 01/30/2016 1129   GFRNONAA 43 (L) 02/04/2018 0754   GFRAA 50 (L) 02/04/2018 0754    Lipid Panel     Component Value Date/Time   CHOL 124 04/21/2018 0942   TRIG 161.0 (H) 04/21/2018 0942   HDL 44.60 04/21/2018 0942   CHOLHDL 3 04/21/2018 0942   VLDL 32.2 04/21/2018 0942   LDLCALC 47 04/21/2018 0942    CBC    Component Value Date/Time   WBC 4.9 04/21/2018 0942   RBC 4.82 04/21/2018 0942   HGB 14.6 04/21/2018 0942   HGB 13.5 01/29/2017 0948   HCT 44.3 04/21/2018 0942   HCT 40.9 01/29/2017 0948   PLT 194.0 04/21/2018 0942   PLT 187 01/29/2017 0948   MCV 91.8 04/21/2018 0942   MCV 91.7 01/29/2017 0948   MCH 30.3 02/04/2018 0754   MCHC 32.9 04/21/2018 0942   RDW 13.1 04/21/2018 0942   RDW 12.5 01/29/2017 0948   LYMPHSABS 0.9 02/04/2018 0754   LYMPHSABS 1.1 01/29/2017 0948   MONOABS 0.3 02/04/2018 0754   MONOABS 0.2 01/29/2017 0948   EOSABS 0.1 02/04/2018 0754   EOSABS 0.1 01/29/2017 0948   BASOSABS 0.0 02/04/2018 0754   BASOSABS 0.0 01/29/2017 0948    Hgb A1C Lab Results  Component Value Date   HGBA1C 6.1 04/21/2018            Assessment & Plan:   Abrasion to Left Forearm:  Apply Neosporin twice daily.   Recommended avoiding picking at scab.  Rash to Right Groin:  Suspect fungal rash vs yeast. Rx for clotrimazole 1% to be applied daily x 4 weeks sent to pharmacy. Instructed patient to continue treatment even if rash resolves. Instructed patient to keep area clean and dry.  Return precautions discussed.  Webb Silversmith, NP

## 2018-09-20 NOTE — Progress Notes (Signed)
Brad Garno T. Tylicia Sherman, MD Primary Care and [place] at [facility] [place], [address] Phone: [phone]  FAX: [place] - 73 y.o. male  MRN [id number]  Date of Birth: 02-06-1946  Visit Date: 09/21/2018  PCP: Jearld Fenton, NP  Referred by: Jearld Fenton, NP  Chief Complaint  Patient presents with  . Foot Pain    Left   Subjective:   This 73 y.o. male patient presents with a 6 mo long history of heel pain. This is notable for worsening pain first thing in the morning when arising and standing after sitting.   Pleasant gentleman who presents with heel pain.  Heel pain on the left. Severe pain with walking. 6 months. Staying. Some tylenol.  No rehab.   Prior foot or ankle fractures: none Prior operations: none Orthotics or bracing: none Medications: none PT or home rehab: none Night splints: no Ice massage: no Ball massage: no  Metatarsal pain: no  The PMH, PSH, Social History, Family History, Medications, and allergies have been reviewed in [facility], and have been updated if relevant.  Patient Active Problem List   Diagnosis Date Noted  . BPH (benign prostatic hyperplasia) 04/09/2016  . GERD (gastroesophageal reflux disease) 08/27/2013  . Arthritis 08/27/2013  . Family hx of colon cancer 06/26/2013  . Atrial fibrillation ([place]) 06/09/2012  . Brugada syndrome 08/05/2011  . MGUS (monoclonal gammopathy of unknown significance) 08/05/2011  . Hypertension 08/05/2010  . MI 07/24/2008  . Automatic implantable cardioverter-defibrillator in situ 07/24/2008  . Type 2 diabetes mellitus with stage 1 chronic kidney disease, without long-term current use of insulin ([place]) 02/12/2008  . Coronary atherosclerosis 03/29/2007  . HLD (hyperlipidemia) 02/08/2007  . Cerebral artery occlusion with cerebral infarction ([place]) 02/08/2007  . Seizures ([place]) 02/08/2007    Past Medical History:  Diagnosis  Date  . Arthritis   . Atrial fibrillation ([place])   . Automatic implantable cardiac defibrillator in situ   . Borderline diabetes mellitus   . CAD (coronary artery disease)   . Colonic polyp 2004   hyperplastic   . COPD (chronic obstructive pulmonary disease) ([place])   . Diabetes mellitus without complication ([place])   . GERD (gastroesophageal reflux disease)   . History of renal insufficiency syndrome   . HTN (hypertension)   . Hyperlipidemia   . Monoclonal gammopathy   . 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 Seizure disorder ([place])   . Stroke [facility])     Past Surgical History:  Procedure Laterality Date  . CARDIAC CATHETERIZATION  1987   Showed distal left circumflex 100% occluded   . CARDIAC DEFIBRILLATOR PLACEMENT  08/17/2006   Implantation of a St. Jude single chamber defibrillator  . EP IMPLANTABLE DEVICE N/A 02/21/2016   Procedure: ICD Generator Changeout;  Surgeon: Evans Lance, MD;  Location: [place];  Service: Cardiovascular;  Laterality: N/A;  . INGUINAL HERNIA REPAIR Left     Social History   Socioeconomic History  . Marital status: Single    Spouse name: Not on file  . Number of children: 1  . Years of education: Not on file  . Highest education level: Not on file  Occupational History  . Occupation: retired    Fish farm manager: RETIRED  Social Needs  . Financial resource strain: Not on file  . Food insecurity    Worry: Not on file    Inability: Not on file  .  Transportation needs    Medical: Not on file    Non-medical: Not on file  Tobacco Use  . Smoking status: Former Smoker    Packs/day: 0.50    Years: 15.00    Pack years: 7.50    Types: Cigarettes    Quit date: 03/23/2004    Years since quitting: 14.5  . Smokeless tobacco: Never Used  Substance and Sexual Activity  . Alcohol use: Yes    Alcohol/week: 1.0 standard drinks    Types: 1 Glasses of wine per week    Comment: rare--wine  . Drug use: No  . Sexual activity: Not on file   Lifestyle  . Physical activity    Days per week: Not on file    Minutes per session: Not on file  . Stress: Not on file  Relationships  . Social Herbalist on phone: Not on file    Gets together: Not on file    Attends religious service: Not on file    Active member of club or organization: Not on file    Attends meetings of clubs or organizations: Not on file    Relationship status: Not on file  . Intimate partner violence    Fear of current or ex partner: Not on file    Emotionally abused: Not on file    Physically abused: Not on file    Forced sexual activity: Not on file  Other Topics Concern  . Not on file  Social History Narrative   ICD-St. Jude  Remote-Yes    Family History  Problem Relation Age of Onset  . Colon cancer Brother   . Heart attack Mother   . Heart attack Brother     Allergies  Allergen Reactions  . Lipitor [Atorvastatin] Rash    Medication list reviewed and updated in full in [place].  GEN: No fevers, chills. Nontoxic. Primarily MSK c/o today. MSK: Detailed in the HPI GI: tolerating PO intake without difficulty Neuro: No numbness, parasthesias, or tingling associated. Otherwise the pertinent positives of the ROS are noted above.   Objective:   Blood pressure 110/64, pulse (!) 59, temperature 97.8 F (36.6 C), temperature source Oral, height 5' 8.25" (1.734 m), weight 196 lb 8 oz (89.1 kg), SpO2 97 %.  GEN: Well-developed,well-nourished,in no acute distress; alert,appropriate and cooperative throughout examination HEENT: Normocephalic and atraumatic without obvious abnormalities. Ears, externally no deformities PULM: Breathing comfortably in no respiratory distress EXT: No clubbing, cyanosis, or edema PSYCH: Normally interactive. Cooperative during the interview. Pleasant. Friendly and conversant. Not anxious or depressed appearing. Normal, full affect.  LEFT FOOT Echymosis: no Edema: no ROM: full LE B Gait: heel toe,  non-antalgic MT pain: no Callus pattern: none Lateral Mall: NT Medial Mall: NT Talus: NT Navicular: NT Calcaneous: NT Metatarsals: NT 5th MT: NT Phalanges: NT Achilles: NT Plantar Fascia: tender, medial along PF. Pain with forced dorsi Fat Pad: NT Peroneals: NT Post Tib: NT Great Toe: Nml motion Ant Drawer: neg Other foot breakdown: none Long arch: preserved Transverse arch: preserved Hindfoot breakdown: none Sensation: intact  Assessment and Plan:     ICD-10-CM   1. Plantar fasciitis, left  M72.2 methylPREDNISolone acetate (DEPO-MEDROL) injection 40 mg    Anatomy reviewed. Stretching and rehab are critically important to the treatment of PF. Reviewed footwear. Rigid soles have been shown to help with PF.  Reviewed rehab of stretching and calf raises.  Reviewed rehab from [facility]  Plantar  Fascia Injection Procedure Note AMONI MORALES July 01, 1945 Date of procedure: 09/21/2018  Procedure: CPT Code 20550, Injection(s); single tendon sheath, or ligament, aponeurosis (eg, plantar fascia), LEFT Indications: Pain  Procedure Details Verbal consent obtained. Risks including risk of rupture, hypopigmentation, and infection reviewed in addition to benefits and alternatives were reviewed. Chloraprep used for prep. Ethyl Chloride used for anesthesia. Under sterile conditions, using the medial approach 4 cc of Lidocaine 1% and Depo-Medrol 20 mg injected superior to plantar fascia and fanned. No compications. Decreased pain post-injection. Medication: Depo-Medrol 20 mg   Follow-up: No follow-ups on file.  Meds ordered this encounter  Medications  . methylPREDNISolone acetate (DEPO-MEDROL) injection 40 mg   No orders of the defined types were placed in this encounter.   Signed,  Maud Deed. Pedro Oldenburg, MD   Patient's Medications  New Prescriptions   No medications on file  Previous Medications   ACETAMINOPHEN (TYLENOL) 500 MG TABLET     Take 1,000 mg by mouth every 6 (six) hours as needed (for pain/headache.).   CICLOPIROX (LOPROX) 0.77 % CREAM    Apply 1 application topically 2 (two) times daily.   CLOTRIMAZOLE 1 % OINT    Apply 1 application topically daily.   FLUTICASONE (FLOVENT HFA) 110 MCG/ACT INHALER    Inhale 1 puff into the lungs 2 (two) times daily.   GLIPIZIDE (GLUCOTROL) 5 MG TABLET    Take 1 tablet (5 mg total) by mouth 2 (two) times daily before a meal. MUST SCHEDULE ANNUAL EXAM   LEVETIRACETAM (KEPPRA) 500 MG TABLET    Take 500-1,000 mg by mouth 2 (two) times daily. Take one tablet by mouth in the morning and two tablets by mouth at night   LOSARTAN (COZAAR) 100 MG TABLET    TAKE 1 TABLET BY MOUTH EVERY DAY   METOPROLOL TARTRATE (LOPRESSOR) 25 MG TABLET    Take 12.5 mg by mouth 2 (two) times daily.   MULTIPLE VITAMIN (MULTIVITAMIN) TABLET    Take 1 tablet by mouth daily.     NAFTIFINE (NAFTIN) 1 % CREAM    Apply 1 application topically 2 (two) times daily as needed (For  infection between toes).    NIACIN, ANTIHYPERLIPIDEMIC, 500 MG TABS    Take 500 mg by mouth daily.   NIFEDIPINE (PROCARDIA-XL/ADALAT CC) 30 MG 24 HR TABLET    Take 30 mg by mouth daily.   OMEGA-3 FATTY ACIDS (FISH OIL) 1200 MG CAPS    Take 1,200 mg by mouth daily.    RIVAROXABAN (XARELTO) 20 MG TABS TABLET    Take 20 mg by mouth daily at 2 PM.    SIMVASTATIN (ZOCOR) 20 MG TABLET    Take 10 mg by mouth at bedtime.    SYMBICORT 160-4.5 MCG/ACT INHALER    INHALE 2 PUFFS INTO THE LUNGS 2 (TWO) TIMES DAILY.   TAMSULOSIN (FLOMAX) 0.4 MG CAPS CAPSULE    Take 0.4 mg by mouth daily.  Modified Medications   No medications on file  Discontinued Medications   No medications on file

## 2018-09-21 ENCOUNTER — Ambulatory Visit (INDEPENDENT_AMBULATORY_CARE_PROVIDER_SITE_OTHER): Payer: Medicare Other | Admitting: Family Medicine

## 2018-09-21 ENCOUNTER — Other Ambulatory Visit: Payer: Self-pay

## 2018-09-21 ENCOUNTER — Encounter: Payer: Self-pay | Admitting: Family Medicine

## 2018-09-21 VITALS — BP 110/64 | HR 59 | Temp 97.8°F | Ht 68.25 in | Wt 196.5 lb

## 2018-09-21 DIAGNOSIS — M722 Plantar fascial fibromatosis: Secondary | ICD-10-CM

## 2018-09-21 DIAGNOSIS — M2390 Unspecified internal derangement of unspecified knee: Secondary | ICD-10-CM

## 2018-09-21 HISTORY — DX: Unspecified internal derangement of unspecified knee: M23.90

## 2018-09-21 MED ORDER — METHYLPREDNISOLONE ACETATE 40 MG/ML IJ SUSP
40.0000 mg | Freq: Once | INTRAMUSCULAR | Status: AC
  Administered 2018-09-21: 13:00:00 40 mg via INTRA_ARTICULAR

## 2018-09-21 NOTE — Patient Instructions (Signed)
Please read handouts on Plantar Fascitis.  STRETCHING and Strengthening program critically important.  Strengthening on foot and calf muscles as seen in handout. Foot massage with tennis ball. Ice massage.  Towel Scrunches: get a towel or hand towel, use toes to pick up and scrunch up the towel.  Marble pick-ups, practice picking up marbles with toes and placing into a cup  NEEDS TO BE DONE EVERY DAY  Tuli's heel cups

## 2018-10-17 ENCOUNTER — Emergency Department (HOSPITAL_COMMUNITY): Payer: Medicare Other

## 2018-10-17 ENCOUNTER — Other Ambulatory Visit: Payer: Self-pay

## 2018-10-17 ENCOUNTER — Inpatient Hospital Stay (HOSPITAL_COMMUNITY)
Admission: EM | Admit: 2018-10-17 | Discharge: 2018-10-21 | DRG: 501 | Disposition: A | Discharge status: Home Health | Payer: Medicare Other | Attending: Internal Medicine | Admitting: Internal Medicine

## 2018-10-17 DIAGNOSIS — Z01818 Encounter for other preprocedural examination: Secondary | ICD-10-CM | POA: Diagnosis not present

## 2018-10-17 DIAGNOSIS — I2510 Atherosclerotic heart disease of native coronary artery without angina pectoris: Secondary | ICD-10-CM | POA: Diagnosis present

## 2018-10-17 DIAGNOSIS — N181 Chronic kidney disease, stage 1: Secondary | ICD-10-CM

## 2018-10-17 DIAGNOSIS — R262 Difficulty in walking, not elsewhere classified: Secondary | ICD-10-CM

## 2018-10-17 DIAGNOSIS — E1122 Type 2 diabetes mellitus with diabetic chronic kidney disease: Secondary | ICD-10-CM | POA: Diagnosis present

## 2018-10-17 DIAGNOSIS — I4821 Permanent atrial fibrillation: Secondary | ICD-10-CM | POA: Diagnosis not present

## 2018-10-17 DIAGNOSIS — R609 Edema, unspecified: Secondary | ICD-10-CM | POA: Diagnosis not present

## 2018-10-17 DIAGNOSIS — Z01810 Encounter for preprocedural cardiovascular examination: Secondary | ICD-10-CM | POA: Diagnosis not present

## 2018-10-17 DIAGNOSIS — Z87891 Personal history of nicotine dependence: Secondary | ICD-10-CM | POA: Diagnosis not present

## 2018-10-17 DIAGNOSIS — W010XXA Fall on same level from slipping, tripping and stumbling without subsequent striking against object, initial encounter: Secondary | ICD-10-CM | POA: Diagnosis present

## 2018-10-17 DIAGNOSIS — I10 Essential (primary) hypertension: Secondary | ICD-10-CM

## 2018-10-17 DIAGNOSIS — D631 Anemia in chronic kidney disease: Secondary | ICD-10-CM | POA: Diagnosis present

## 2018-10-17 DIAGNOSIS — N401 Enlarged prostate with lower urinary tract symptoms: Secondary | ICD-10-CM

## 2018-10-17 DIAGNOSIS — Y93H2 Activity, gardening and landscaping: Secondary | ICD-10-CM | POA: Diagnosis not present

## 2018-10-17 DIAGNOSIS — S76109A Unspecified injury of unspecified quadriceps muscle, fascia and tendon, initial encounter: Secondary | ICD-10-CM | POA: Diagnosis not present

## 2018-10-17 DIAGNOSIS — S8391XA Sprain of unspecified site of right knee, initial encounter: Secondary | ICD-10-CM | POA: Diagnosis not present

## 2018-10-17 DIAGNOSIS — S76112A Strain of left quadriceps muscle, fascia and tendon, initial encounter: Secondary | ICD-10-CM | POA: Diagnosis not present

## 2018-10-17 DIAGNOSIS — I4891 Unspecified atrial fibrillation: Secondary | ICD-10-CM | POA: Diagnosis present

## 2018-10-17 DIAGNOSIS — J449 Chronic obstructive pulmonary disease, unspecified: Secondary | ICD-10-CM | POA: Diagnosis not present

## 2018-10-17 DIAGNOSIS — M25062 Hemarthrosis, left knee: Secondary | ICD-10-CM | POA: Diagnosis present

## 2018-10-17 DIAGNOSIS — Z7901 Long term (current) use of anticoagulants: Secondary | ICD-10-CM

## 2018-10-17 DIAGNOSIS — S83105A Unspecified dislocation of left knee, initial encounter: Secondary | ICD-10-CM | POA: Diagnosis present

## 2018-10-17 DIAGNOSIS — Z8249 Family history of ischemic heart disease and other diseases of the circulatory system: Secondary | ICD-10-CM

## 2018-10-17 DIAGNOSIS — R350 Frequency of micturition: Secondary | ICD-10-CM

## 2018-10-17 DIAGNOSIS — Z20828 Contact with and (suspected) exposure to other viral communicable diseases: Secondary | ICD-10-CM | POA: Diagnosis present

## 2018-10-17 DIAGNOSIS — K219 Gastro-esophageal reflux disease without esophagitis: Secondary | ICD-10-CM | POA: Diagnosis not present

## 2018-10-17 DIAGNOSIS — I471 Supraventricular tachycardia, unspecified: Secondary | ICD-10-CM | POA: Diagnosis present

## 2018-10-17 DIAGNOSIS — Z7951 Long term (current) use of inhaled steroids: Secondary | ICD-10-CM

## 2018-10-17 DIAGNOSIS — E785 Hyperlipidemia, unspecified: Secondary | ICD-10-CM | POA: Diagnosis present

## 2018-10-17 DIAGNOSIS — S83422A Sprain of lateral collateral ligament of left knee, initial encounter: Secondary | ICD-10-CM | POA: Diagnosis not present

## 2018-10-17 DIAGNOSIS — N400 Enlarged prostate without lower urinary tract symptoms: Secondary | ICD-10-CM | POA: Diagnosis present

## 2018-10-17 DIAGNOSIS — Y92009 Unspecified place in unspecified non-institutional (private) residence as the place of occurrence of the external cause: Secondary | ICD-10-CM

## 2018-10-17 DIAGNOSIS — G40909 Epilepsy, unspecified, not intractable, without status epilepticus: Secondary | ICD-10-CM | POA: Diagnosis present

## 2018-10-17 DIAGNOSIS — I440 Atrioventricular block, first degree: Secondary | ICD-10-CM | POA: Diagnosis present

## 2018-10-17 DIAGNOSIS — Z03818 Encounter for observation for suspected exposure to other biological agents ruled out: Secondary | ICD-10-CM | POA: Diagnosis not present

## 2018-10-17 DIAGNOSIS — R52 Pain, unspecified: Secondary | ICD-10-CM | POA: Diagnosis not present

## 2018-10-17 DIAGNOSIS — R4701 Aphasia: Secondary | ICD-10-CM | POA: Diagnosis not present

## 2018-10-17 DIAGNOSIS — N183 Chronic kidney disease, stage 3 (moderate): Secondary | ICD-10-CM | POA: Diagnosis present

## 2018-10-17 DIAGNOSIS — S8002XA Contusion of left knee, initial encounter: Secondary | ICD-10-CM

## 2018-10-17 DIAGNOSIS — I498 Other specified cardiac arrhythmias: Secondary | ICD-10-CM | POA: Diagnosis not present

## 2018-10-17 DIAGNOSIS — K5900 Constipation, unspecified: Secondary | ICD-10-CM | POA: Diagnosis present

## 2018-10-17 DIAGNOSIS — M25561 Pain in right knee: Secondary | ICD-10-CM | POA: Diagnosis not present

## 2018-10-17 DIAGNOSIS — I252 Old myocardial infarction: Secondary | ICD-10-CM | POA: Diagnosis not present

## 2018-10-17 DIAGNOSIS — W19XXXA Unspecified fall, initial encounter: Secondary | ICD-10-CM

## 2018-10-17 DIAGNOSIS — D472 Monoclonal gammopathy: Secondary | ICD-10-CM | POA: Diagnosis not present

## 2018-10-17 DIAGNOSIS — G8918 Other acute postprocedural pain: Secondary | ICD-10-CM | POA: Diagnosis not present

## 2018-10-17 DIAGNOSIS — I129 Hypertensive chronic kidney disease with stage 1 through stage 4 chronic kidney disease, or unspecified chronic kidney disease: Secondary | ICD-10-CM | POA: Diagnosis present

## 2018-10-17 DIAGNOSIS — Y92007 Garden or yard of unspecified non-institutional (private) residence as the place of occurrence of the external cause: Secondary | ICD-10-CM

## 2018-10-17 DIAGNOSIS — S76119A Strain of unspecified quadriceps muscle, fascia and tendon, initial encounter: Secondary | ICD-10-CM | POA: Diagnosis not present

## 2018-10-17 DIAGNOSIS — Z7984 Long term (current) use of oral hypoglycemic drugs: Secondary | ICD-10-CM

## 2018-10-17 DIAGNOSIS — Z8673 Personal history of transient ischemic attack (TIA), and cerebral infarction without residual deficits: Secondary | ICD-10-CM

## 2018-10-17 DIAGNOSIS — I480 Paroxysmal atrial fibrillation: Secondary | ICD-10-CM | POA: Diagnosis present

## 2018-10-17 DIAGNOSIS — Z95810 Presence of automatic (implantable) cardiac defibrillator: Secondary | ICD-10-CM | POA: Diagnosis not present

## 2018-10-17 DIAGNOSIS — S83106A Unspecified dislocation of unspecified knee, initial encounter: Secondary | ICD-10-CM | POA: Diagnosis present

## 2018-10-17 DIAGNOSIS — E782 Mixed hyperlipidemia: Secondary | ICD-10-CM | POA: Diagnosis not present

## 2018-10-17 DIAGNOSIS — R29818 Other symptoms and signs involving the nervous system: Secondary | ICD-10-CM | POA: Diagnosis not present

## 2018-10-17 DIAGNOSIS — S83412A Sprain of medial collateral ligament of left knee, initial encounter: Secondary | ICD-10-CM | POA: Diagnosis not present

## 2018-10-17 DIAGNOSIS — R569 Unspecified convulsions: Secondary | ICD-10-CM | POA: Diagnosis not present

## 2018-10-17 LAB — CBC
HCT: 38.3 % — ABNORMAL LOW (ref 39.0–52.0)
Hemoglobin: 12.7 g/dL — ABNORMAL LOW (ref 13.0–17.0)
MCH: 30.6 pg (ref 26.0–34.0)
MCHC: 33.2 g/dL (ref 30.0–36.0)
MCV: 92.3 fL (ref 80.0–100.0)
Platelets: 173 10*3/uL (ref 150–400)
RBC: 4.15 MIL/uL — ABNORMAL LOW (ref 4.22–5.81)
RDW: 12.2 % (ref 11.5–15.5)
WBC: 8.6 10*3/uL (ref 4.0–10.5)
nRBC: 0 % (ref 0.0–0.2)

## 2018-10-17 LAB — BASIC METABOLIC PANEL
Anion gap: 10 (ref 5–15)
BUN: 16 mg/dL (ref 8–23)
CO2: 22 mmol/L (ref 22–32)
Calcium: 8.9 mg/dL (ref 8.9–10.3)
Chloride: 105 mmol/L (ref 98–111)
Creatinine, Ser: 1.47 mg/dL — ABNORMAL HIGH (ref 0.61–1.24)
GFR calc Af Amer: 55 mL/min — ABNORMAL LOW (ref >60)
GFR calc non Af Amer: 48 mL/min — ABNORMAL LOW (ref >60)
Glucose, Bld: 118 mg/dL — ABNORMAL HIGH (ref 70–99)
Potassium: 4.2 mmol/L (ref 3.5–5.1)
Sodium: 137 mmol/L (ref 135–145)

## 2018-10-17 MED ORDER — HYDROCODONE-ACETAMINOPHEN 5-325 MG PO TABS
1.0000 | ORAL_TABLET | Freq: Once | ORAL | Status: AC
  Administered 2018-10-17: 1 via ORAL
  Filled 2018-10-17: qty 1

## 2018-10-17 NOTE — ED Provider Notes (Signed)
Patient taken in sign out from PA Lawyer at shift change. Patient currently has a large traumatic hematoma of the left knee after a fall. CT pending.   Patient CT returned.  CT showed large effusion and hematoma.  Concern for potential high-grade partial quad tendon rupture on reexamination the patient is unable to extend the leg.  I consulted with Dr. Fredonia Highland who states that he will need repair that he would optimally like to have the patient ambulate with crutches and follow-up this coming Thursday.  I have ordered compression for the leg with Ace wrap, knee of the mobilizer and crutches.   Patient states that he is unable to ambulate due to pain in the right knee which he states that he informed healthcare providers about here previously however he did not have an evaluation.  Patient is able to flex and extend however when I tried to stand the patient up next to the bed and holding he is unable to apply a lot of pressure to stand up from a seated position and has significant pain.  Ordered an x-ray of the right knee.   Patient right knee x-ray has returned.  Personally reviewed the x-ray which shows no acute abnormalities.  As patient is unable to ambulate I will admit him to the hospital service.  I have updated Dr. Percell Miller who is aware.  Patient is agreeable with plan for admission and Dr. Roel Cluck will admit the patient.   Margarita Mail, PA-C 10/22/18 1615    Isla Pence, MD 10/23/18 [phone]

## 2018-10-17 NOTE — ED Triage Notes (Signed)
Pt with left knee pain and swelling after falling downhill while mowing his lawn today. Distal circulation intact. VSS.

## 2018-10-17 NOTE — ED Provider Notes (Signed)
[facility] EMERGENCY DEPARTMENT Provider Note   CSN: [id number] Arrival date & time: 10/17/18  1350     History   Chief Complaint Chief Complaint  Patient presents with  . Knee Injury    HPI HARBERT FITTERER is a 73 y.o. male.     HPI Patient presents to the emergency department with left knee injury that occurred while mowing the lawn today.  The patient states he slipped and fell directly down on his knee.  Patient states he is having difficulty putting weight on his leg after the injury.  Patient states that certain movements palpation make the pain worse.  Patient states he did not take any medications prior to arrival for his symptoms.  Patient denies any other injuries.  Patient denies any syncope, nausea, vomiting, weakness, dizziness, headache, blurred vision, neck pain or back pain. Past Medical History:  Diagnosis Date  . Arthritis   . Atrial fibrillation ([place])   . Automatic implantable cardiac defibrillator in situ   . Borderline diabetes mellitus   . CAD (coronary artery disease)   . Colonic polyp 2004   hyperplastic   . COPD (chronic obstructive pulmonary disease) ([place])   . Diabetes mellitus without complication ([place])   . GERD (gastroesophageal reflux disease)   . History of renal insufficiency syndrome   . HTN (hypertension)   . Hyperlipidemia   . Monoclonal gammopathy   . 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 Seizure disorder ([place])   . Stroke [facility])     Patient Active Problem List   Diagnosis Date Noted  . BPH (benign prostatic hyperplasia) 04/09/2016  . GERD (gastroesophageal reflux disease) 08/27/2013  . Arthritis 08/27/2013  . Family hx of colon cancer 06/26/2013  . Atrial fibrillation ([place]) 06/09/2012  . Brugada syndrome 08/05/2011  . MGUS (monoclonal gammopathy of unknown significance) 08/05/2011  . Hypertension 08/05/2010  . MI 07/24/2008  . Automatic implantable cardioverter-defibrillator in situ  07/24/2008  . Type 2 diabetes mellitus with stage 1 chronic kidney disease, without long-term current use of insulin ([place]) 02/12/2008  . Coronary atherosclerosis 03/29/2007  . HLD (hyperlipidemia) 02/08/2007  . Cerebral artery occlusion with cerebral infarction ([place]) 02/08/2007  . Seizures ([place]) 02/08/2007    Past Surgical History:  Procedure Laterality Date  . CARDIAC CATHETERIZATION  1987   Showed distal left circumflex 100% occluded   . CARDIAC DEFIBRILLATOR PLACEMENT  08/17/2006   Implantation of a St. Jude single chamber defibrillator  . EP IMPLANTABLE DEVICE N/A 02/21/2016   Procedure: ICD Generator Changeout;  Surgeon: Evans Lance, MD;  Location: [facility];  Service: Cardiovascular;  Laterality: N/A;  . INGUINAL HERNIA REPAIR Left         Home Medications    Prior to Admission medications   Medication Sig Start Date End Date Taking? Authorizing Provider  acetaminophen (TYLENOL) 500 MG tablet Take 1,000 mg by mouth every 6 (six) hours as needed (for pain/headache.).    [provider]  ciclopirox (LOPROX) 0.77 % cream Apply 1 application topically 2 (two) times daily.    [provider]  Clotrimazole 1 % OINT Apply 1 application topically daily. 09/13/18   Jearld Fenton, NP  fluticasone (FLOVENT HFA) 110 MCG/ACT inhaler Inhale 1 puff into the lungs 2 (two) times daily. 12/06/14   Jearld Fenton, NP  glipiZIDE (GLUCOTROL) 5 MG tablet Take 1 tablet (5 mg total) by mouth 2 (two) times daily before a meal. MUST SCHEDULE ANNUAL EXAM  03/04/16   Jearld Fenton, NP  levETIRAcetam (KEPPRA) 500 MG tablet Take 500-1,000 mg by mouth 2 (two) times daily. Take one tablet by mouth in the morning and two tablets by mouth at night    [provider]  losartan (COZAAR) 100 MG tablet TAKE 1 TABLET BY MOUTH EVERY DAY 08/03/16   Jearld Fenton, NP  metoprolol tartrate (LOPRESSOR) 25 MG tablet Take 12.5 mg by mouth 2 (two) times daily. 07/21/12   Evans Lance,  MD  Multiple Vitamin (MULTIVITAMIN) tablet Take 1 tablet by mouth daily.      [provider]  naftifine (NAFTIN) 1 % cream Apply 1 application topically 2 (two) times daily as needed (For  infection between toes).     [provider]  Niacin, Antihyperlipidemic, 500 MG TABS Take 500 mg by mouth daily. 02/28/14   Jearld Fenton, NP  NIFEdipine (PROCARDIA-XL/ADALAT CC) 30 MG 24 hr tablet Take 30 mg by mouth daily.    [provider]  Omega-3 Fatty Acids (FISH OIL) 1200 MG CAPS Take 1,200 mg by mouth daily.     [provider]  Rivaroxaban (XARELTO) 20 MG TABS tablet Take 20 mg by mouth daily at 2 PM.     [provider]  simvastatin (ZOCOR) 20 MG tablet Take 10 mg by mouth at bedtime.     [provider]  SYMBICORT 160-4.5 MCG/ACT inhaler INHALE 2 PUFFS INTO THE LUNGS 2 (TWO) TIMES DAILY. 04/22/16   Noralee Space, MD  tamsulosin (FLOMAX) 0.4 MG CAPS capsule Take 0.4 mg by mouth daily. 02/04/15   [provider]    Family History Family History  Problem Relation Age of Onset  . Colon cancer Brother   . Heart attack Mother   . Heart attack Brother     Social History Social History   Tobacco Use  . Smoking status: Former Smoker    Packs/day: 0.50    Years: 15.00    Pack years: 7.50    Types: Cigarettes    Quit date: 03/23/2004    Years since quitting: 14.5  . Smokeless tobacco: Never Used  Substance Use Topics  . Alcohol use: Yes    Alcohol/week: 1.0 standard drinks    Types: 1 Glasses of wine per week    Comment: rare--wine  . Drug use: No     Allergies   Lipitor [atorvastatin]   Review of Systems Review of Systems All other systems negative except as documented in the HPI. All pertinent positives and negatives as reviewed in the HPI.  Physical Exam Updated Vital Signs BP (!) 157/83 (BP Location: Right Arm)   Pulse 79   Temp 98.3 F (36.8 C) (Oral)   Resp 16   Ht 5\' 9"  (1.753 m)   Wt 86.2 kg   SpO2 99%    BMI 28.06 kg/m   Physical Exam Vitals signs and nursing note reviewed.  Constitutional:      General: He is not in acute distress.    Appearance: He is well-developed.  HENT:     Head: Normocephalic and atraumatic.  Eyes:     Pupils: Pupils are equal, round, and reactive to light.  Pulmonary:     Effort: Pulmonary effort is normal.  Musculoskeletal:     Left knee: He exhibits decreased range of motion, swelling, effusion and ecchymosis.  Skin:    General: Skin is warm and dry.  Neurological:     Mental Status: He is alert and  oriented to person, place, and time.      ED Treatments / Results  Labs (all labs ordered are listed, but only abnormal results are displayed) Labs Reviewed - No data to display  EKG None  Radiology Dg Knee Complete 4 Views Left  Result Date: 10/17/2018 CLINICAL DATA:  72 year old male status post fall onto knee with pain and swelling. EXAM: LEFT KNEE - COMPLETE 4+ VIEW COMPARISON:  None. FINDINGS: Bone mineralization is within normal limits. The patella appears to remain intact although there is severe soft tissue swelling anterior and superior to the patella. There is also evidence of a suprapatellar joint effusion which may be hyperdense. Joint spaces and alignment are preserved. No acute osseous abnormality identified. IMPRESSION: No fracture identified but large hematoma anterior and superior to the patella and evidence of a joint effusion. Electronically Signed   By: Genevie Ann M.D.   On: 10/17/2018 15:42    Procedures Procedures (including critical care time)  Medications Ordered in ED Medications - No data to display   Initial Impression / Assessment and Plan / ED Course  I have reviewed the triage vital signs and the nursing notes.  Pertinent labs & imaging results that were available during my care of the patient were reviewed by me and considered in my medical decision making (see chart for details).       I feel that with the  patient's amount of swelling and x-ray findings that we should CT scan his knee to make sure there is no fracture that was missed on plain films.  Patient will be most likely placed in a knee immobilizer and crutches or advised to use a walker. Final Clinical Impressions(s) / ED Diagnoses   Final diagnoses:  None    ED Discharge Orders    None       Dalia Heading, Hershal Coria 10/17/18 1557    Quintella Reichert, MD 10/17/18 [phone]

## 2018-10-17 NOTE — ED Notes (Signed)
Pt unable to stand up to walk due to not being able to bear weight on R knee.

## 2018-10-17 NOTE — ED Notes (Signed)
Patient transported to X-ray 

## 2018-10-17 NOTE — ED Notes (Signed)
ED TO INPATIENT HANDOFF REPORT  ED Nurse Name and Phone #: Marisa Hua RN [phone]  S Name/Age/Gender Brad Brown 73 y.o. male Room/Bed: 009C/009C  Code Status   Code Status: Prior  Home/SNF/Other Rehab   Patient oriented to: self, place, time and situation Is this baseline? Yes   Triage Complete: Triage complete  Chief Complaint fall  Triage Note Pt with left knee pain and swelling after falling downhill while mowing his lawn today. Distal circulation intact. VSS.    Allergies Allergies  Allergen Reactions  . Lipitor [Atorvastatin] Rash    Level of Care/Admitting Diagnosis ED Disposition    ED Disposition Condition [facility]: [facility] [[id number]]  Level of Care: Med-Surg [16]  Covid Evaluation: Asymptomatic Screening Protocol (No Symptoms)  Diagnosis: Acute traumatic internal derangement of left knee [[id number]]  Admitting Physician: Toy Baker [[id number]]  Attending Physician: Toy Baker [[id number]]  Estimated length of stay: 3 - 4 days  Certification:: I certify this patient will need inpatient services for at least 2 midnights  PT Class (Do Not Modify): Inpatient [101]  PT Acc Code (Do Not Modify): Private [1]       B Medical/Surgery History Past Medical History:  Diagnosis Date  . Arthritis   . Atrial fibrillation ([place])   . Automatic implantable cardiac defibrillator in situ   . Borderline diabetes mellitus   . CAD (coronary artery disease)   . Colonic polyp 2004   hyperplastic   . COPD (chronic obstructive pulmonary disease) ([place])   . Diabetes mellitus without complication ([place])   . GERD (gastroesophageal reflux disease)   . History of renal insufficiency syndrome   . HTN (hypertension)   . Hyperlipidemia   . Monoclonal gammopathy   . 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 Seizure disorder ([place])   . Stroke [facility])    Past Surgical History:  Procedure Laterality Date  .  CARDIAC CATHETERIZATION  1987   Showed distal left circumflex 100% occluded   . CARDIAC DEFIBRILLATOR PLACEMENT  08/17/2006   Implantation of a St. Jude single chamber defibrillator  . EP IMPLANTABLE DEVICE N/A 02/21/2016   Procedure: ICD Generator Changeout;  Surgeon: Evans Lance, MD;  Location: [facility] CV LAB;  Service: Cardiovascular;  Laterality: N/A;  . INGUINAL HERNIA REPAIR Left      A IV Location/Drains/Wounds Patient Lines/Drains/Airways Status   Active Line/Drains/Airways    Name:   Placement date:   Placement time:   Site:   Days:   Peripheral IV 10/17/18 Right Antecubital   10/17/18    1700    Antecubital   less than 1          Intake/Output Last 24 hours No intake or output data in the 24 hours ending 10/17/18 2350  Labs/Imaging Results for orders placed or performed during the hospital encounter of 10/17/18 (from the past 48 hour(s))  CBC     Status: Abnormal   Collection Time: 10/17/18  9:48 PM  Result Value Ref Range   WBC 8.6 4.0 - 10.5 K/uL   RBC 4.15 (L) 4.22 - 5.81 MIL/uL   Hemoglobin 12.7 (L) 13.0 - 17.0 g/dL   HCT 38.3 (L) 39.0 - 52.0 %   MCV 92.3 80.0 - 100.0 fL   MCH 30.6 26.0 - 34.0 pg   MCHC 33.2 30.0 - 36.0 g/dL   RDW 12.2 11.5 - 15.5 %   Platelets 173 150 - 400 K/uL  nRBC 0.0 0.0 - 0.2 %    Comment: Performed at [facility], [place] [address], [place], [place] [address]  Basic metabolic panel     Status: Abnormal   Collection Time: 10/17/18  9:48 PM  Result Value Ref Range   Sodium 137 135 - 145 mmol/L   Potassium 4.2 3.5 - 5.1 mmol/L   Chloride 105 98 - 111 mmol/L   CO2 22 22 - 32 mmol/L   Glucose, Bld 118 (H) 70 - 99 mg/dL   BUN 16 8 - 23 mg/dL   Creatinine, Ser 1.47 (H) 0.61 - 1.24 mg/dL   Calcium 8.9 8.9 - 10.3 mg/dL   GFR calc non Af Amer 48 (L) >60 mL/min   GFR calc Af Amer 55 (L) >60 mL/min   Anion gap 10 5 - 15    Comment: Performed at [facility] [address] [place], [place] [address]   Ct Knee Left Wo  Contrast  Result Date: 10/17/2018 CLINICAL DATA:  Left knee pain and swelling after falling while mowing lawn. EXAM: CT OF THE LEFT KNEE WITHOUT CONTRAST TECHNIQUE: Multidetector CT imaging of the left knee was performed according to the standard protocol. Multiplanar CT image reconstructions were also generated. COMPARISON:  Radiographs same date FINDINGS: Bones/Joint/Cartilage No evidence of acute fracture or dislocation. The joint spaces are preserved. There is a moderate-size, complex suprapatellar joint effusion without evidence of intra-articular loose body or foreign body. Ligaments Suboptimally assessed by CT. The cruciate ligaments appear grossly intact. Muscles and Tendons The distal quadriceps tendon is poorly visualized and is likely torn from its patellar insertion. On the reformatted images, the tendon appears to be retracted approximately 2.9 cm. There is no inferior subluxation of the patella or patellar tendon laxity. The lower leg musculature appears normal. Soft tissues There is prominent suprapatellar soft tissue swelling with ill-defined hematoma anterior to the distal quadriceps tendon and the patella. IMPRESSION: 1. Prominent soft tissue swelling superior to the patella with ill-defined hematoma around the distal quadriceps tendon and patella. Suspected rupture or high-grade partial tear of the distal quadriceps tendon without inferior displacement of the patella. 2. Complex knee joint effusion. 3. No evidence of acute fracture. Electronically Signed   By: Richardean Sale M.D.   On: 10/17/2018 18:08   Dg Knee Complete 4 Views Left  Result Date: 10/17/2018 CLINICAL DATA:  73 year old male status post fall onto knee with pain and swelling. EXAM: LEFT KNEE - COMPLETE 4+ VIEW COMPARISON:  None. FINDINGS: Bone mineralization is within normal limits. The patella appears to remain intact although there is severe soft tissue swelling anterior and superior to the patella. There is also evidence  of a suprapatellar joint effusion which may be hyperdense. Joint spaces and alignment are preserved. No acute osseous abnormality identified. IMPRESSION: No fracture identified but large hematoma anterior and superior to the patella and evidence of a joint effusion. Electronically Signed   By: Genevie Ann M.D.   On: 10/17/2018 15:42   Dg Knee Complete 4 Views Right  Result Date: 10/17/2018 CLINICAL DATA:  Right knee pain after a fall today. EXAM: RIGHT KNEE - COMPLETE 4+ VIEW COMPARISON:  None. FINDINGS: No evidence of fracture, dislocation, or joint effusion. No evidence of arthropathy or other focal bone abnormality. Soft tissues are unremarkable. IMPRESSION: Negative. Electronically Signed   By: Logan Bores M.D.   On: 10/17/2018 21:01    Pending Labs [employer] (From admission, onward)    Start  Ordered   10/17/18 2210  SARS Coronavirus 2 (CEPHEID - Performed in [facility] hospital lab), Hosp Order  (Asymptomatic Patients Labs)  Once,   STAT    Question:  Rule Out  Answer:  Yes   10/17/18 2210   Signed and Held  HIV antibody (Routine Testing)  Tomorrow morning,   R     Signed and Held   Signed and Held  CBC  Tomorrow morning,   R     Signed and Held   Signed and Held  Basic metabolic panel  Tomorrow morning,   R     Signed and Held   Signed and Held  Type and screen  Once,   R     Signed and Held          Vitals/Pain Today's Vitals   10/17/18 1604 10/17/18 1949 10/17/18 2307 10/17/18 2347  BP:    (!) 144/90  Pulse:    78  Resp:    16  Temp:    98.2 F (36.8 C)  TempSrc:    Oral  SpO2:    99%  Weight:      Height:      PainSc: 8  6  5  3      Isolation Precautions Droplet precaution  Medications Medications  HYDROcodone-acetaminophen (NORCO/VICODIN) 5-325 MG per tablet 1 tablet (1 tablet Oral Given 10/17/18 1604)  HYDROcodone-acetaminophen (NORCO/VICODIN) 5-325 MG per tablet 1 tablet (1 tablet Oral Given 10/17/18 1948)    Mobility non-ambulatory Moderate fall  risk   Focused Assessments muscle skeletal   R Recommendations: See Admitting Provider Note  Report given to:   Additional Notes:  PT unable to ambulate with knee immobilizer and crutches. Admit for pain and mobiltiy

## 2018-10-17 NOTE — H&P (Addendum)
Brad Brown:[id number] DOB: July 11, 1945 DOA: 10/17/2018     PCP: Jearld Fenton, NP   Outpatient Specialists:   CARDS:   Dr. Lovena Le    Patient arrived to ER on 10/17/18 at 1350  Patient coming from: home Lives   With roomate   Chief Complaint:  Chief Complaint  Patient presents with   Knee Injury    HPI: Brad Brown is a 73 y.o. male with medical history significant of a.fib, Brugada syndrome,s/p ICD insertion , diabetes mellitus, CAD, COPD, GERD, HTN, monoclonal gammopathy, HLD, seizure disorder, history of CVA    Presented with left knee pain and swelling after falling downhill while mowing his lawn  Fall he slipped and fell and fell directly down on his knee he has been having trouble putting weight on his leg but they do pain.  No associated syncope no nausea no vomiting no chest pain or shortness of breath associated this  Infectious risk factors:  Reports  In  ER RAPID COVID TEST in house testing    Pending     Regarding pertinent Chronic problems:   Hyperlipidemia -  on statins Zocor   HTN on cozaar, metoprolol, nifedipine    CAD  - On Aspirin, statin, betablocker, Plavix                 -  followed by cardiology                - last cardiac cath History of seizure disorder on Keppra  DM 2 -  Lab Results  Component Value Date   HGBA1C 6.1 04/21/2018    PO meds only,         Hx of CVA - *with/out residual deficits on Aspirin 81 mg, 325, Plavix   A. Fib -  - CHA2DS2 vas score 6 :  current  on anticoagulation with  Xarelto          -  Rate control:  Currently controlled with   Metoprolol,      While in ER: Left knee high grade quad tendon tear based on CT With hematoma  The following Work up has been ordered so far:  Orders Placed This Encounter  Procedures   SARS Coronavirus 2 (CEPHEID - Performed in [place] hospital lab), Hosp Order   DG Knee Complete 4 Views Left   CT Knee Left Wo Contrast   DG Knee Complete 4 Views  Right   DG Chest Port 1 View   CBC   Basic metabolic panel   Apply ace wrap   Crutches   Apply knee immobilizer   Consult to hospitalist  ALL PATIENTS BEING ADMITTED/HAVING PROCEDURES NEED COVID-19 SCREENING   Consult to social work   Droplet precaution   EKG 12-Lead   EKG 12-Lead   Admit to Inpatient (patient's expected length of stay will be greater than 2 midnights or inpatient only procedure)     Following Medications were ordered in ER: Medications  HYDROcodone-acetaminophen (NORCO/VICODIN) 5-325 MG per tablet 1 tablet (1 tablet Oral Given 10/17/18 1604)  HYDROcodone-acetaminophen (NORCO/VICODIN) 5-325 MG per tablet 1 tablet (1 tablet Oral Given 10/17/18 1948)        Consult Orders  (From admission, onward)         Start     Ordered   10/17/18 2329  Consult to social work  Once    Provider:  (Not yet assigned)  Question:  Reason for Consult:  Answer:  Nursing  home placement   10/17/18 2330   10/17/18 2228  Consult to hospitalist  ALL PATIENTS BEING ADMITTED/HAVING PROCEDURES NEED COVID-19 SCREENING Paged [facility]  Once    Comments: ALL PATIENTS BEING ADMITTED/HAVING PROCEDURES NEED COVID-19 SCREENING  Provider:  (Not yet assigned)  Question Answer Comment  Place call to: [facility]   Reason for Consult Admit      10/17/18 2227          ER Provider Called:   Orthopedics  Dr. Percell Miller  They Recommend admit to medicine   Will see in AM    Significant initial  Findings: Abnormal Labs Reviewed  CBC - Abnormal; Notable for the following components:      Result Value   RBC 4.15 (*)    Hemoglobin 12.7 (*)    HCT 38.3 (*)    All other components within normal limits  BASIC METABOLIC PANEL - Abnormal; Notable for the following components:   Glucose, Bld 118 (*)    Creatinine, Ser 1.47 (*)    GFR calc non Af Amer 48 (*)    GFR calc Af Amer 55 (*)    All other components within normal limits    Otherwise labs showing:    Recent Labs  Lab  10/17/18 2148  NA 137  K 4.2  CO2 22  GLUCOSE 118*  BUN 16  CREATININE 1.47*  CALCIUM 8.9    Cr stable,   Lab Results  Component Value Date   CREATININE 1.47 (H) 10/17/2018   CREATININE 1.42 04/21/2018   CREATININE 1.56 (H) 02/04/2018    No results for input(s): AST, ALT, ALKPHOS, BILITOT, PROT, ALBUMIN in the last 168 hours. Lab Results  Component Value Date   CALCIUM 8.9 10/17/2018      WBC      Component Value Date/Time   WBC 8.6 10/17/2018 2148   ANC    Component Value Date/Time   NEUTROABS 3.7 02/04/2018 0754   NEUTROABS 2.6 01/29/2017 0948   ALC No results found for: LYMPHOABS    Plt: Lab Results  Component Value Date   PLT 173 10/17/2018      No results found for: SARSCOV2NAA     ABG    Component Value Date/Time   TCO2 26 03/18/2008 1734      HG/HCT   Stable,     Component Value Date/Time   HGB 12.7 (L) 10/17/2018 2148   HGB 13.5 01/29/2017 0948   HCT 38.3 (L) 10/17/2018 2148   HCT 40.9 01/29/2017 0948    DM  labs:  HbA1C: Recent Labs    04/21/18 0942  HGBA1C 6.1       CBG (last 3)  No results for input(s): GLUCAP in the last 72 hours.     UA  not ordered      CXR - *NON acute   CT left knee Suspected rupture or high-grade partial tear of the distal quadriceps tendon    Right knee non acute  ECG:  Personally reviewed by me showing: HR : 84 Rhythm:  NSR    no evidence of ischemic changes QTC 441      ED Triage Vitals  Enc Vitals Group     BP 10/17/18 1356 (!) 157/83     Pulse Rate 10/17/18 1356 79     Resp 10/17/18 1356 16     Temp 10/17/18 1356 98.3 F (36.8 C)     Temp Source 10/17/18 1356 Oral     SpO2 10/17/18 1356 99 %  Weight 10/17/18 1357 190 lb (86.2 kg)     Height 10/17/18 1357 5\' 9"  (1.753 m)     Head Circumference --      Peak Flow --      Pain Score 10/17/18 1356 8     Pain Loc --      Pain Edu? --      Excl. in [place]? --   TMAX(24)@       Latest  Blood pressure (!) 157/83, pulse  79, temperature 98.3 F (36.8 C), temperature source Oral, resp. rate 16, height 5\' 9"  (1.753 m), weight 86.2 kg, SpO2 99 %.     Hospitalist was called for admission for   Admitted for left quadricep tendon rupture with hemarthrosis   Review of Systems:    Pertinent positives include: bilateral knee injury   Constitutional:  No weight loss, night sweats, Fevers, chills, fatigue, weight loss  HEENT:  No headaches, Difficulty swallowing,Tooth/dental problems,Sore throat,  No sneezing, itching, ear ache, nasal congestion, post nasal drip,  Cardio-vascular:  No chest pain, Orthopnea, PND, anasarca, dizziness, palpitations.no Bilateral lower extremity swelling  GI:  No heartburn, indigestion, abdominal pain, nausea, vomiting, diarrhea, change in bowel habits, loss of appetite, melena, blood in stool, hematemesis Resp:  no shortness of breath at rest. No dyspnea on exertion, No excess mucus, no productive cough, No non-productive cough, No coughing up of blood.No change in color of mucus.No wheezing. Skin:  no rash or lesions. No jaundice GU:  no dysuria, change in color of urine, no urgency or frequency. No straining to urinate.  No flank pain.  Musculoskeletal:  No joint pain or no joint swelling. No decreased range of motion. No back pain.  Psych:  No change in mood or affect. No depression or anxiety. No memory loss.  Neuro: no localizing neurological complaints, no tingling, no weakness, no double vision, no gait abnormality, no slurred speech, no confusion  All systems reviewed and apart from [place] all are negative  Past Medical History:   Past Medical History:  Diagnosis Date   Arthritis    Atrial fibrillation (HCC)    Automatic implantable cardiac defibrillator in situ    Borderline diabetes mellitus    CAD (coronary artery disease)    Colonic polyp 2004   hyperplastic    COPD (chronic obstructive pulmonary disease) (HCC)    Diabetes mellitus without  complication (HCC)    GERD (gastroesophageal reflux disease)    History of renal insufficiency syndrome    HTN (hypertension)    Hyperlipidemia    Monoclonal gammopathy    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 single chamber defibrillator   EP IMPLANTABLE DEVICE N/A 02/21/2016   Procedure: ICD Generator Changeout;  Surgeon: Evans Lance, MD;  Location: [facility];  Service: Cardiovascular;  Laterality: N/A;   INGUINAL HERNIA REPAIR Left     Social History:  Ambulatory independently       reports that he quit smoking about 14 years ago. His smoking use included cigarettes. He has a 7.50 pack-year smoking history. He has never used smokeless tobacco. He reports current alcohol use of about 1.0 standard drinks of alcohol per week. He reports that  he does not use drugs.     Family History:   Family History  Problem Relation Age of Onset   Colon cancer Brother    Heart attack Mother    Heart attack Brother     Allergies: Allergies  Allergen Reactions   Lipitor [Atorvastatin] Rash     Prior to Admission medications   Medication Sig Start Date End Date Taking? Authorizing Provider  acetaminophen (TYLENOL) 500 MG tablet Take 1,000 mg by mouth every 6 (six) hours as needed (for pain/headache.).    [provider]  ciclopirox (LOPROX) 0.77 % cream Apply 1 application topically 2 (two) times daily.    [provider]  Clotrimazole 1 % OINT Apply 1 application topically daily. 09/13/18   Jearld Fenton, NP  fluticasone (FLOVENT HFA) 110 MCG/ACT inhaler Inhale 1 puff into the lungs 2 (two) times daily. 12/06/14   Jearld Fenton, NP  glipiZIDE (GLUCOTROL) 5 MG tablet Take 1  tablet (5 mg total) by mouth 2 (two) times daily before a meal. MUST SCHEDULE ANNUAL EXAM 03/04/16   Jearld Fenton, NP  levETIRAcetam (KEPPRA) 500 MG tablet Take 500-1,000 mg by mouth 2 (two) times daily. Take one tablet by mouth in the morning and two tablets by mouth at night    [provider]  losartan (COZAAR) 100 MG tablet TAKE 1 TABLET BY MOUTH EVERY DAY 08/03/16   Jearld Fenton, NP  metoprolol tartrate (LOPRESSOR) 25 MG tablet Take 12.5 mg by mouth 2 (two) times daily. 07/21/12   Evans Lance, MD  Multiple Vitamin (MULTIVITAMIN) tablet Take 1 tablet by mouth daily.      [provider]  naftifine (NAFTIN) 1 % cream Apply 1 application topically 2 (two) times daily as needed (For  infection between toes).     [provider]  Niacin, Antihyperlipidemic, 500 MG TABS Take 500 mg by mouth daily. 02/28/14   Jearld Fenton, NP  NIFEdipine (PROCARDIA-XL/ADALAT CC) 30 MG 24 hr tablet Take 30 mg by mouth daily.    [provider]  Omega-3 Fatty Acids (FISH OIL) 1200 MG CAPS Take 1,200 mg by mouth daily.     [provider]  Rivaroxaban (XARELTO) 20 MG TABS tablet Take 20 mg by mouth daily at 2 PM.     [provider]  simvastatin (ZOCOR) 20 MG tablet Take 10 mg by mouth at bedtime.     [provider]  SYMBICORT 160-4.5 MCG/ACT inhaler INHALE 2 PUFFS INTO THE LUNGS 2 (TWO) TIMES DAILY. 04/22/16   Noralee Space, MD  tamsulosin (FLOMAX) 0.4 MG CAPS capsule Take 0.4 mg by mouth daily. 02/04/15   [provider]   Physical Exam: Blood pressure (!) 157/83, pulse 79, temperature 98.3 F (36.8 C), temperature source Oral, resp. rate 16, height 5\' 9"  (1.753 m), weight 86.2 kg, SpO2 99 %. 1. General:  in No Acute distress   well -appearing 2. Psychological: Alert and  Oriented 3. Head/ENT:   Dry Mucous Membranes                          Head Non traumatic, neck supple                            Poor Dentition 4. SKIN:  decreased  Skin turgor,  Skin clean Dry and intact no rash 5. Heart: Regular rate and rhythm no  Murmur, no Rub or gallop 6. Lungs:   no wheezes or crackles   7. Abdomen: Soft,  non-tender, Non distended  obese    8. Lower extremities: no clubbing, cyanosis, significant left knee swelling 9. Neurologically Grossly intact, moving all 4 extremities equally  10. MSK: Normal range of motion limited in both Knees due to PAin   All other LABS:     Recent Labs  Lab 10/17/18 2148  WBC 8.6  HGB 12.7*  HCT 38.3*  MCV 92.3  PLT 173     Recent Labs  Lab 10/17/18 2148  NA 137  K 4.2  CL 105  CO2 22  GLUCOSE 118*  BUN 16  CREATININE 1.47*  CALCIUM 8.9     No results for input(s): AST, ALT, ALKPHOS, BILITOT, PROT, ALBUMIN in the last 168 hours.     Cultures: No results found for: SDES, SPECREQUEST, CULT, REPTSTATUS   Radiological Exams on Admission: Ct Knee Left Wo Contrast  Result Date: 10/17/2018 CLINICAL DATA:  Left knee pain and swelling after falling while mowing lawn. EXAM: CT OF THE LEFT KNEE WITHOUT CONTRAST TECHNIQUE: Multidetector CT imaging of the left knee was performed according to the standard protocol. Multiplanar CT image reconstructions were also generated. COMPARISON:  Radiographs same date FINDINGS: Bones/Joint/Cartilage No evidence of acute fracture or dislocation. The joint spaces are preserved. There is a moderate-size, complex suprapatellar joint effusion without evidence of intra-articular loose body or foreign body. Ligaments Suboptimally assessed by CT. The cruciate ligaments appear grossly intact. Muscles and Tendons The distal quadriceps tendon is poorly visualized and is likely torn from its patellar insertion. On the reformatted images, the tendon appears to be retracted approximately 2.9 cm. There is no inferior subluxation of the patella or patellar tendon laxity. The lower leg musculature appears normal. Soft tissues There is prominent suprapatellar soft tissue  swelling with ill-defined hematoma anterior to the distal quadriceps tendon and the patella. IMPRESSION: 1. Prominent soft tissue swelling superior to the patella with ill-defined hematoma around the distal quadriceps tendon and patella. Suspected rupture or high-grade partial tear of the distal quadriceps tendon without inferior displacement of the patella. 2. Complex knee joint effusion. 3. No evidence of acute fracture. Electronically Signed   By: Richardean Sale M.D.   On: 10/17/2018 18:08   Dg Knee Complete 4 Views Left  Result Date: 10/17/2018 CLINICAL DATA:  73 year old male status post fall onto knee with pain and swelling. EXAM: LEFT KNEE - COMPLETE 4+ VIEW COMPARISON:  None. FINDINGS: Bone mineralization is within normal limits. The patella appears to remain intact although there is severe soft tissue swelling anterior and superior to the patella. There is also evidence of a suprapatellar joint effusion which may be hyperdense. Joint spaces and alignment are preserved. No acute osseous abnormality identified. IMPRESSION: No fracture identified but large hematoma anterior and superior to the patella and evidence of a joint effusion. Electronically Signed   By: Genevie Ann M.D.   On: 10/17/2018 15:42   Dg Knee Complete 4 Views Right  Result Date: 10/17/2018 CLINICAL DATA:  Right knee pain after a fall today. EXAM: RIGHT KNEE - COMPLETE 4+ VIEW COMPARISON:  None. FINDINGS: No evidence of fracture, dislocation, or joint effusion. No evidence of arthropathy or other focal bone abnormality. Soft tissues are unremarkable. IMPRESSION: Negative. Electronically Signed   By: Logan Bores M.D.   On: 10/17/2018 21:01    Chart has been reviewed   Assessment/Plan   73 y.o. male with medical  history significant of a.fib, Brugada syndrome,s/p ICD insertion , diabetes mellitus, CAD, COPD, GERD, HTN, monoclonal gammopathy, HLD, seizure disorder, history of CVA  Admitted for left quadricep tendon rupture with  hemarthrosis  Present on Admission:  Acute traumatic internal derangement of knee, initial encounter -   - management as per orthopedics,  plan to operate in a few days Patient  on anticoagulation or antiplatelet agents  Will hold Ordered type and screen,     Patient at baseline   able to walk a flight of stairs or 100 feet   Patient denies any chest pain or shortness of breath currently and/or with exertion,   ECG showing no evidence of acute ischemia   known history of coronary artery disease,  COPD    Given advanced age patient is at least moderate  Risk  which has been discussed with  Patient     Cardiology consult for further pre-op clearance given extensive cardiac disease     HLD (hyperlipidemia) -   stable continue home medications    Type 2 diabetes mellitus with stage 1 chronic kidney disease, without long-term current use of insulin (HCC) -  - Order Sensitive SSI     -  check TSH and HgA1C  - Hold by mouth medications     Automatic implantable cardioverter-defibrillator in situ - CHRONIC PT WITH Hx of Brugada symdrome   Hypertension   stable continue home medications    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  DVT prophylaxis:  SCD   Code Status:  FULL CODE  as per patient  I had personally discussed CODE STATUS with patient    Family Communication:   Family not at  Bedside    Disposition Plan:                            Back to current  facility when stable                          Would benefit from PT/OT eval prior to DC  defer to orthopedics on timing                                    Social Work  consulted                   Consults called: Zadie Rhine     Admission status:  ED Disposition    ED Disposition Condition [place]: [facility] [[id number]]  Level of Care: Med-Surg [16]  Covid Evaluation: Asymptomatic Screening Protocol (No Symptoms)  Diagnosis: Acute traumatic internal derangement of left knee [[id number]]  Admitting Physician: Toy Baker [[id number]]  Attending Physician: Toy Baker [[id number]]  Estimated length of stay: 3 - 4 days  Certification:: I certify this patient will need inpatient services for at least 2 midnights  PT Class (Do Not Modify): Inpatient [101]  PT Acc Code (Do Not Modify): Private [1]         Inpatient    Level of care    tele  For  12h      Precautions: Droplet,  Droplet precaution  PPE: Used by the provider:   P100  eye Goggles,  Gloves     Brad Brown 10/17/2018, 11:42 PM           [facility]     after 2 AM please page floor coverage PA If 7AM-7PM, please contact the day team taking care of the patient using [email or web address]

## 2018-10-18 ENCOUNTER — Encounter (HOSPITAL_COMMUNITY): Payer: Self-pay | Admitting: General Practice

## 2018-10-18 ENCOUNTER — Other Ambulatory Visit: Payer: Self-pay

## 2018-10-18 DIAGNOSIS — Z95810 Presence of automatic (implantable) cardiac defibrillator: Secondary | ICD-10-CM

## 2018-10-18 DIAGNOSIS — Z01810 Encounter for preprocedural cardiovascular examination: Secondary | ICD-10-CM

## 2018-10-18 LAB — CBC
HCT: 35.7 % — ABNORMAL LOW (ref 39.0–52.0)
Hemoglobin: 12 g/dL — ABNORMAL LOW (ref 13.0–17.0)
MCH: 30.3 pg (ref 26.0–34.0)
MCHC: 33.6 g/dL (ref 30.0–36.0)
MCV: 90.2 fL (ref 80.0–100.0)
Platelets: 170 10*3/uL (ref 150–400)
RBC: 3.96 MIL/uL — ABNORMAL LOW (ref 4.22–5.81)
RDW: 12.1 % (ref 11.5–15.5)
WBC: 7 10*3/uL (ref 4.0–10.5)
nRBC: 0 % (ref 0.0–0.2)

## 2018-10-18 LAB — GLUCOSE, CAPILLARY
Glucose-Capillary: 112 mg/dL — ABNORMAL HIGH (ref 70–99)
Glucose-Capillary: 112 mg/dL — ABNORMAL HIGH (ref 70–99)
Glucose-Capillary: 121 mg/dL — ABNORMAL HIGH (ref 70–99)
Glucose-Capillary: 162 mg/dL — ABNORMAL HIGH (ref 70–99)
Glucose-Capillary: 182 mg/dL — ABNORMAL HIGH (ref 70–99)
Glucose-Capillary: 186 mg/dL — ABNORMAL HIGH (ref 70–99)

## 2018-10-18 LAB — BASIC METABOLIC PANEL
Anion gap: 7 (ref 5–15)
BUN: 14 mg/dL (ref 8–23)
CO2: 24 mmol/L (ref 22–32)
Calcium: 8.8 mg/dL — ABNORMAL LOW (ref 8.9–10.3)
Chloride: 105 mmol/L (ref 98–111)
Creatinine, Ser: 1.39 mg/dL — ABNORMAL HIGH (ref 0.61–1.24)
GFR calc Af Amer: 59 mL/min — ABNORMAL LOW (ref >60)
GFR calc non Af Amer: 51 mL/min — ABNORMAL LOW (ref >60)
Glucose, Bld: 116 mg/dL — ABNORMAL HIGH (ref 70–99)
Potassium: 4 mmol/L (ref 3.5–5.1)
Sodium: 136 mmol/L (ref 135–145)

## 2018-10-18 LAB — HIV ANTIBODY (ROUTINE TESTING W REFLEX): HIV Screen 4th Generation wRfx: NONREACTIVE

## 2018-10-18 LAB — TYPE AND SCREEN
ABO/RH(D): O POS
Antibody Screen: NEGATIVE

## 2018-10-18 LAB — ABO/RH: ABO/RH(D): O POS

## 2018-10-18 LAB — SARS CORONAVIRUS 2 BY RT PCR (HOSPITAL ORDER, PERFORMED IN ~~LOC~~ HOSPITAL LAB): SARS Coronavirus 2: NEGATIVE

## 2018-10-18 LAB — MRSA PCR SCREENING: MRSA by PCR: NEGATIVE

## 2018-10-18 MED ORDER — INSULIN ASPART 100 UNIT/ML ~~LOC~~ SOLN
0.0000 [IU] | SUBCUTANEOUS | Status: DC
  Administered 2018-10-18: 1 [IU] via SUBCUTANEOUS
  Administered 2018-10-18: 2 [IU] via SUBCUTANEOUS
  Administered 2018-10-18: 1 [IU] via SUBCUTANEOUS
  Administered 2018-10-18 – 2018-10-19 (×3): 2 [IU] via SUBCUTANEOUS
  Administered 2018-10-19 – 2018-10-20 (×3): 1 [IU] via SUBCUTANEOUS
  Administered 2018-10-21: 3 [IU] via SUBCUTANEOUS
  Administered 2018-10-21 (×4): 1 [IU] via SUBCUTANEOUS

## 2018-10-18 MED ORDER — SIMVASTATIN 20 MG PO TABS
10.0000 mg | ORAL_TABLET | Freq: Every day | ORAL | Status: DC
  Administered 2018-10-18 – 2018-10-20 (×3): 10 mg via ORAL
  Filled 2018-10-18 (×3): qty 1

## 2018-10-18 MED ORDER — METHOCARBAMOL 1000 MG/10ML IJ SOLN
500.0000 mg | Freq: Four times a day (QID) | INTRAVENOUS | Status: DC | PRN
  Filled 2018-10-18: qty 5

## 2018-10-18 MED ORDER — MOMETASONE FURO-FORMOTEROL FUM 200-5 MCG/ACT IN AERO
2.0000 | INHALATION_SPRAY | Freq: Two times a day (BID) | RESPIRATORY_TRACT | Status: DC
  Administered 2018-10-19 – 2018-10-21 (×4): 2 via RESPIRATORY_TRACT
  Filled 2018-10-18 (×2): qty 8.8

## 2018-10-18 MED ORDER — LEVETIRACETAM 500 MG PO TABS
1000.0000 mg | ORAL_TABLET | Freq: Every evening | ORAL | Status: DC
  Administered 2018-10-18 – 2018-10-20 (×3): 1000 mg via ORAL
  Filled 2018-10-18 (×3): qty 2

## 2018-10-18 MED ORDER — LEVETIRACETAM 500 MG PO TABS
500.0000 mg | ORAL_TABLET | Freq: Every morning | ORAL | Status: DC
  Administered 2018-10-18 – 2018-10-21 (×4): 500 mg via ORAL
  Filled 2018-10-18 (×4): qty 1

## 2018-10-18 MED ORDER — NIFEDIPINE ER OSMOTIC RELEASE 30 MG PO TB24
30.0000 mg | ORAL_TABLET | Freq: Every day | ORAL | Status: DC
  Administered 2018-10-18 – 2018-10-21 (×4): 30 mg via ORAL
  Filled 2018-10-18 (×4): qty 1

## 2018-10-18 MED ORDER — METOPROLOL TARTRATE 25 MG PO TABS
12.5000 mg | ORAL_TABLET | Freq: Two times a day (BID) | ORAL | Status: DC
  Administered 2018-10-18 – 2018-10-21 (×7): 12.5 mg via ORAL
  Filled 2018-10-18 (×7): qty 1

## 2018-10-18 MED ORDER — TAMSULOSIN HCL 0.4 MG PO CAPS
0.4000 mg | ORAL_CAPSULE | Freq: Every day | ORAL | Status: DC
  Administered 2018-10-18 – 2018-10-21 (×4): 0.4 mg via ORAL
  Filled 2018-10-18 (×4): qty 1

## 2018-10-18 MED ORDER — FLUTICASONE PROPIONATE HFA 110 MCG/ACT IN AERO: 1.0000 | INHALATION_SPRAY | Freq: Two times a day (BID) | RESPIRATORY_TRACT | Status: DC

## 2018-10-18 MED ORDER — HYDROCODONE-ACETAMINOPHEN 5-325 MG PO TABS
1.0000 | ORAL_TABLET | Freq: Four times a day (QID) | ORAL | Status: DC | PRN
  Administered 2018-10-18 – 2018-10-21 (×5): 2 via ORAL
  Filled 2018-10-18: qty 2
  Filled 2018-10-18: qty 1
  Filled 2018-10-18: qty 2
  Filled 2018-10-18: qty 1
  Filled 2018-10-18 (×2): qty 2

## 2018-10-18 MED ORDER — LEVETIRACETAM 500 MG PO TABS: 500.0000 mg | ORAL_TABLET | Freq: Two times a day (BID) | ORAL | Status: DC

## 2018-10-18 MED ORDER — MORPHINE SULFATE (PF) 2 MG/ML IV SOLN: 0.5000 mg | INTRAVENOUS | Status: DC | PRN

## 2018-10-18 MED ORDER — LOSARTAN POTASSIUM 50 MG PO TABS
100.0000 mg | ORAL_TABLET | Freq: Every day | ORAL | Status: DC
  Administered 2018-10-18 – 2018-10-21 (×4): 100 mg via ORAL
  Filled 2018-10-18 (×4): qty 2

## 2018-10-18 MED ORDER — METHOCARBAMOL 500 MG PO TABS: 500.0000 mg | ORAL_TABLET | Freq: Four times a day (QID) | ORAL | Status: DC | PRN

## 2018-10-18 NOTE — Plan of Care (Signed)

## 2018-10-18 NOTE — Plan of Care (Signed)

## 2018-10-18 NOTE — Consult Note (Signed)
Reason for Consult:Left quad tendon rupture Referring Physician: Daralene Milch  Brad Brown is an 73 y.o. male.  HPI: [name] was mowing grass on an incline and fell. He had immediate pain in his left knee and couldn't get up or walk. He was brought to the ED where x-rays and CT were consistent with a quad tendon rupture. He apparently had too much pain to discharge and was admitted. He c/o localized pain to the area.  Past Medical History:  Diagnosis Date  . Arthritis   . Atrial fibrillation ([place])   . Automatic implantable cardiac defibrillator in situ   . Borderline diabetes mellitus   . CAD (coronary artery disease)   . Colonic polyp 2004   hyperplastic   . COPD (chronic obstructive pulmonary disease) ([place])   . Diabetes mellitus without complication ([place])   . GERD (gastroesophageal reflux disease)   . History of renal insufficiency syndrome   . HTN (hypertension)   . Hyperlipidemia   . Knee derangement 09/2018   LEFT KNEE  . Monoclonal gammopathy   . 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 Seizure disorder ([place])   . Stroke [facility])     Past Surgical History:  Procedure Laterality Date  . CARDIAC CATHETERIZATION  1987   Showed distal left circumflex 100% occluded   . CARDIAC DEFIBRILLATOR PLACEMENT  08/17/2006   Implantation of a [employer] single chamber defibrillator  . EP IMPLANTABLE DEVICE N/A 02/21/2016   Procedure: ICD Generator Changeout;  Surgeon: Evans Lance, MD;  Location: [facility];  Service: Cardiovascular;  Laterality: N/A;  . INGUINAL HERNIA REPAIR Left     Family History  Problem Relation Age of Onset  . Colon cancer Brother   . Heart attack Mother   . Heart attack Brother     Social History:  reports that he quit smoking about 14 years ago. His smoking use included cigarettes. He has a 7.50 pack-year smoking history. He has never used smokeless tobacco. He reports current alcohol use of about 1.0 standard drinks of alcohol per  week. He reports that he does not use drugs.  Allergies:  Allergies  Allergen Reactions  . Lipitor [Atorvastatin] Rash    Medications: I have reviewed the patient's current medications.  Results for orders placed or performed during the hospital encounter of 10/17/18 (from the past 48 hour(s))  CBC     Status: Abnormal   Collection Time: 10/17/18  9:48 PM  Result Value Ref Range   WBC 8.6 4.0 - 10.5 K/uL   RBC 4.15 (L) 4.22 - 5.81 MIL/uL   Hemoglobin 12.7 (L) 13.0 - 17.0 g/dL   HCT 38.3 (L) 39.0 - 52.0 %   MCV 92.3 80.0 - 100.0 fL   MCH 30.6 26.0 - 34.0 pg   MCHC 33.2 30.0 - 36.0 g/dL   RDW 12.2 11.5 - 15.5 %   Platelets 173 150 - 400 K/uL   nRBC 0.0 0.0 - 0.2 %    Comment: Performed at [facility], [place] [address] [place], [place] [address]  Basic metabolic panel     Status: Abnormal   Collection Time: 10/17/18  9:48 PM  Result Value Ref Range   Sodium 137 135 - 145 mmol/L   Potassium 4.2 3.5 - 5.1 mmol/L   Chloride 105 98 - 111 mmol/L   CO2 22 22 - 32 mmol/L   Glucose, Bld 118 (H) 70 - 99 mg/dL   BUN 16 8 -  23 mg/dL   Creatinine, Ser 1.47 (H) 0.61 - 1.24 mg/dL   Calcium 8.9 8.9 - 10.3 mg/dL   GFR calc non Af Amer 48 (L) >60 mL/min   GFR calc Af Amer 55 (L) >60 mL/min   Anion gap 10 5 - 15    Comment: Performed at [facility] [address], [place], [place] [address]  SARS Coronavirus 2 (CEPHEID - Performed in [place] hospital lab), Hosp Order     Status: None   Collection Time: 10/17/18 11:05 PM   Specimen: Nasopharyngeal Swab  Result Value Ref Range   SARS Coronavirus 2 NEGATIVE NEGATIVE    Comment: (NOTE) If result is NEGATIVE SARS-CoV-2 target nucleic acids are NOT DETECTED. The SARS-CoV-2 RNA is generally detectable in upper and lower  respiratory specimens during the acute phase of infection. The lowest  concentration of SARS-CoV-2 viral copies this assay can detect is 250  copies / mL. A negative result does not preclude SARS-CoV-2  infection  and should not be used as the sole basis for treatment or other  patient management decisions.  A negative result may occur with  improper specimen collection / handling, submission of specimen other  than nasopharyngeal swab, presence of viral mutation(s) within the  areas targeted by this assay, and inadequate number of viral copies  (<250 copies / mL). A negative result must be combined with clinical  observations, patient history, and epidemiological information. If result is POSITIVE SARS-CoV-2 target nucleic acids are DETECTED. The SARS-CoV-2 RNA is generally detectable in upper and lower  respiratory specimens dur ing the acute phase of infection.  Positive  results are indicative of active infection with SARS-CoV-2.  Clinical  correlation with patient history and other diagnostic information is  necessary to determine patient infection status.  Positive results do  not rule out bacterial infection or co-infection with other viruses. If result is PRESUMPTIVE POSTIVE SARS-CoV-2 nucleic acids MAY BE PRESENT.   A presumptive positive result was obtained on the submitted specimen  and confirmed on repeat testing.  While 2019 novel coronavirus  (SARS-CoV-2) nucleic acids may be present in the submitted sample  additional confirmatory testing may be necessary for epidemiological  and / or clinical management purposes  to differentiate between  SARS-CoV-2 and other Sarbecovirus currently known to infect humans.  If clinically indicated additional testing with an alternate test  methodology [phone]) is advised. The SARS-CoV-2 RNA is generally  detectable in upper and lower respiratory sp ecimens during the acute  phase of infection. The expected result is Negative. Fact Sheet for Patients:  StrictlyIdeas.no Fact Sheet for Healthcare Providers: BankingDealers.co.za This test is not yet approved or cleared by the [place]  FDA and has been authorized for detection and/or diagnosis of SARS-CoV-2 by FDA under an Emergency Use Authorization (EUA).  This EUA will remain in effect (meaning this test can be used) for the duration of the COVID-19 declaration under Section 564(b)(1) of the Act, 21 U.S.C. section 360bbb-3(b)(1), unless the authorization is terminated or revoked sooner. Performed at [facility], [place] [address] [place], [place] [address]   MRSA PCR Screening     Status: None   Collection Time: 10/18/18 12:36 AM   Specimen: Nasopharyngeal  Result Value Ref Range   MRSA by PCR NEGATIVE NEGATIVE    Comment:        The GeneXpert MRSA Assay (FDA approved for NASAL specimens only), is one component of a comprehensive MRSA colonization surveillance program.  It is not intended to diagnose MRSA infection nor to guide or monitor treatment for MRSA infections. Performed at [facility], [place] [address], [place], [place] [address]   Glucose, capillary     Status: Abnormal   Collection Time: 10/18/18 12:55 AM  Result Value Ref Range   Glucose-Capillary 121 (H) 70 - 99 mg/dL  CBC     Status: Abnormal   Collection Time: 10/18/18  1:26 AM  Result Value Ref Range   WBC 7.0 4.0 - 10.5 K/uL   RBC 3.96 (L) 4.22 - 5.81 MIL/uL   Hemoglobin 12.0 (L) 13.0 - 17.0 g/dL   HCT 35.7 (L) 39.0 - 52.0 %   MCV 90.2 80.0 - 100.0 fL   MCH 30.3 26.0 - 34.0 pg   MCHC 33.6 30.0 - 36.0 g/dL   RDW 12.1 11.5 - 15.5 %   Platelets 170 150 - 400 K/uL   nRBC 0.0 0.0 - 0.2 %    Comment: Performed at [facility], [place] [address], [place], [place] [address]  Basic metabolic panel     Status: Abnormal   Collection Time: 10/18/18  1:26 AM  Result Value Ref Range   Sodium 136 135 - 145 mmol/L   Potassium 4.0 3.5 - 5.1 mmol/L   Chloride 105 98 - 111 mmol/L   CO2 24 22 - 32 mmol/L   Glucose, Bld 116 (H) 70 - 99 mg/dL   BUN 14 8 - 23 mg/dL   Creatinine, Ser 1.39 (H) 0.61 - 1.24 mg/dL   Calcium 8.8 (L) 8.9 -  10.3 mg/dL   GFR calc non Af Amer 51 (L) >60 mL/min   GFR calc Af Amer 59 (L) >60 mL/min   Anion gap 7 5 - 15    Comment: Performed at [facility] [address] [place], [place] [address]  Type and screen     Status: None   Collection Time: 10/18/18  1:26 AM  Result Value Ref Range   ABO/RH(D) O POS    Antibody Screen NEG    Sample Expiration      10/21/2018,2359 Performed at [facility], [place] [address], [place], [place] [address]   ABO/Rh     Status: None   Collection Time: 10/18/18  1:26 AM  Result Value Ref Range   ABO/RH(D)      O POS Performed at [facility] [address], [place], [place] [address]   Glucose, capillary     Status: Abnormal   Collection Time: 10/18/18  5:21 AM  Result Value Ref Range   Glucose-Capillary 112 (H) 70 - 99 mg/dL  Glucose, capillary     Status: Abnormal   Collection Time: 10/18/18  7:32 AM  Result Value Ref Range   Glucose-Capillary 112 (H) 70 - 99 mg/dL    Ct Knee Left Wo Contrast  Result Date: 10/17/2018 CLINICAL DATA:  Left knee pain and swelling after falling while mowing lawn. EXAM: CT OF THE LEFT KNEE WITHOUT CONTRAST TECHNIQUE: Multidetector CT imaging of the left knee was performed according to the standard protocol. Multiplanar CT image reconstructions were also generated. COMPARISON:  Radiographs same date FINDINGS: Bones/Joint/Cartilage No evidence of acute fracture or dislocation. The joint spaces are preserved. There is a moderate-size, complex suprapatellar joint effusion without evidence of intra-articular loose body or foreign body. Ligaments Suboptimally assessed by CT. The cruciate ligaments appear grossly intact. Muscles and Tendons The distal quadriceps tendon is poorly visualized and  is likely torn from its patellar insertion. On the reformatted images, the tendon appears to be retracted approximately 2.9 cm. There is no inferior subluxation of the patella or patellar tendon laxity. The lower leg  musculature appears normal. Soft tissues There is prominent suprapatellar soft tissue swelling with ill-defined hematoma anterior to the distal quadriceps tendon and the patella. IMPRESSION: 1. Prominent soft tissue swelling superior to the patella with ill-defined hematoma around the distal quadriceps tendon and patella. Suspected rupture or high-grade partial tear of the distal quadriceps tendon without inferior displacement of the patella. 2. Complex knee joint effusion. 3. No evidence of acute fracture. Electronically Signed   By: Richardean Sale M.D.   On: 10/17/2018 18:08   Dg Chest Port 1 View  Result Date: 10/17/2018 CLINICAL DATA:  Preop for knee surgery EXAM: PORTABLE CHEST 1 VIEW COMPARISON:  November 30, 2014 FINDINGS: There is mild enlargement of the cardiac silhouette. A left-sided pacemaker seen with the lead tip in the right ventricle. The lungs are clear. No large airspace consolidation. No acute osseous findings. Bilateral shoulder arthropathy is seen. IMPRESSION: No acute cardiopulmonary process. Electronically Signed   By: Prudencio Pair M.D.   On: 10/17/2018 23:56   Dg Knee Complete 4 Views Left  Result Date: 10/17/2018 CLINICAL DATA:  73 year old male status post fall onto knee with pain and swelling. EXAM: LEFT KNEE - COMPLETE 4+ VIEW COMPARISON:  None. FINDINGS: Bone mineralization is within normal limits. The patella appears to remain intact although there is severe soft tissue swelling anterior and superior to the patella. There is also evidence of a suprapatellar joint effusion which may be hyperdense. Joint spaces and alignment are preserved. No acute osseous abnormality identified. IMPRESSION: No fracture identified but large hematoma anterior and superior to the patella and evidence of a joint effusion. Electronically Signed   By: Genevie Ann M.D.   On: 10/17/2018 15:42   Dg Knee Complete 4 Views Right  Result Date: 10/17/2018 CLINICAL DATA:  Right knee pain after a fall today.  EXAM: RIGHT KNEE - COMPLETE 4+ VIEW COMPARISON:  None. FINDINGS: No evidence of fracture, dislocation, or joint effusion. No evidence of arthropathy or other focal bone abnormality. Soft tissues are unremarkable. IMPRESSION: Negative. Electronically Signed   By: Logan Bores M.D.   On: 10/17/2018 21:01    Review of Systems  Constitutional: Negative for weight loss.  HENT: Negative for ear discharge, ear pain, hearing loss and tinnitus.   Eyes: Negative for blurred vision, double vision, photophobia and pain.  Respiratory: Negative for cough, sputum production and shortness of breath.   Cardiovascular: Negative for chest pain.  Gastrointestinal: Negative for abdominal pain, nausea and vomiting.  Genitourinary: Negative for dysuria, flank pain, frequency and urgency.  Musculoskeletal: Positive for joint pain (Left knee). Negative for back pain, falls, myalgias and neck pain.  Neurological: Negative for dizziness, tingling, sensory change, focal weakness, loss of consciousness and headaches.  Endo/Heme/Allergies: Does not bruise/bleed easily.  Psychiatric/Behavioral: Negative for depression, memory loss and substance abuse. The patient is not nervous/anxious.    Blood pressure (!) 145/83, pulse 84, temperature 98.4 F (36.9 C), temperature source Oral, resp. rate 16, height 5\' 9"  (1.753 m), weight 86.2 kg, SpO2 97 %. Physical Exam  Constitutional: He appears well-developed and well-nourished. No distress.  HENT:  Head: Normocephalic and atraumatic.  Eyes: Conjunctivae are normal. Right eye exhibits no discharge. Left eye exhibits no discharge. No scleral icterus.  Neck: Normal range of motion.  Cardiovascular: Normal rate  and regular rhythm.  Respiratory: Effort normal. No respiratory distress.  Musculoskeletal:     Comments: LLE No traumatic wounds, ecchymosis, or rash  Mod TTP knee, unable to SLR  Large knee effusion, no ankle effusion  Sens DPN, SPN, TN intact  Motor EHL, ext, flex,  evers 5/5  DP 2+, PT 2+, No significant edema  Neurological: He is alert.  Skin: Skin is warm and dry. He is not diaphoretic.  Psychiatric: He has a normal mood and affect. His behavior is normal.    Assessment/Plan: Left quad tendon rupture -- Plan repair Thursday by Dr. Percell Miller. Multiple medical problems including a.fib, Brugada syndrome,s/p ICD insertion , diabetes mellitus, CAD, COPD, GERD, HTN, monoclonal gammopathy, HLD, seizure disorder, history of CVA -- per primary service    Lisette Abu, PA-C Orthopedic Surgery [phone] 10/18/2018, 11:34 AM

## 2018-10-18 NOTE — H&P (View-Only) (Signed)
Reason for Consult:Left quad tendon rupture Referring Physician: Daralene Milch  Brad Brown is an 73 y.o. male.  HPI: Brad Brown was mowing grass on an incline and fell. He had immediate pain in his left knee and couldn't get up or walk. He was brought to the ED where x-rays and CT were consistent with a quad tendon rupture. He apparently had too much pain to discharge and was admitted. He c/o localized pain to the area.  Past Medical History:  Diagnosis Date  . Arthritis   . Atrial fibrillation ([place])   . Automatic implantable cardiac defibrillator in situ   . Borderline diabetes mellitus   . CAD (coronary artery disease)   . Colonic polyp 2004   hyperplastic   . COPD (chronic obstructive pulmonary disease) ([place])   . Diabetes mellitus without complication ([place])   . GERD (gastroesophageal reflux disease)   . History of renal insufficiency syndrome   . HTN (hypertension)   . Hyperlipidemia   . Knee derangement 09/2018   LEFT KNEE  . Monoclonal gammopathy   . 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 Seizure disorder ([place])   . Stroke [facility])     Past Surgical History:  Procedure Laterality Date  . CARDIAC CATHETERIZATION  1987   Showed distal left circumflex 100% occluded   . CARDIAC DEFIBRILLATOR PLACEMENT  08/17/2006   Implantation of a [employer] single chamber defibrillator  . EP IMPLANTABLE DEVICE N/A 02/21/2016   Procedure: ICD Generator Changeout;  Surgeon: Evans Lance, MD;  Location: [facility];  Service: Cardiovascular;  Laterality: N/A;  . INGUINAL HERNIA REPAIR Left     Family History  Problem Relation Age of Onset  . Colon cancer Brother   . Heart attack Mother   . Heart attack Brother     Social History:  reports that he quit smoking about 14 years ago. His smoking use included cigarettes. He has a 7.50 pack-year smoking history. He has never used smokeless tobacco. He reports current alcohol use of about 1.0 standard drinks of alcohol per  week. He reports that he does not use drugs.  Allergies:  Allergies  Allergen Reactions  . Lipitor [Atorvastatin] Rash    Medications: I have reviewed the patient's current medications.  Results for orders placed or performed during the hospital encounter of 10/17/18 (from the past 48 hour(s))  CBC     Status: Abnormal   Collection Time: 10/17/18  9:48 PM  Result Value Ref Range   WBC 8.6 4.0 - 10.5 K/uL   RBC 4.15 (L) 4.22 - 5.81 MIL/uL   Hemoglobin 12.7 (L) 13.0 - 17.0 g/dL   HCT 38.3 (L) 39.0 - 52.0 %   MCV 92.3 80.0 - 100.0 fL   MCH 30.6 26.0 - 34.0 pg   MCHC 33.2 30.0 - 36.0 g/dL   RDW 12.2 11.5 - 15.5 %   Platelets 173 150 - 400 K/uL   nRBC 0.0 0.0 - 0.2 %    Comment: Performed at [facility], [place] [address] [place], [place] [address]  Basic metabolic panel     Status: Abnormal   Collection Time: 10/17/18  9:48 PM  Result Value Ref Range   Sodium 137 135 - 145 mmol/L   Potassium 4.2 3.5 - 5.1 mmol/L   Chloride 105 98 - 111 mmol/L   CO2 22 22 - 32 mmol/L   Glucose, Bld 118 (H) 70 - 99 mg/dL   BUN 16 8 -  23 mg/dL   Creatinine, Ser 1.47 (H) 0.61 - 1.24 mg/dL   Calcium 8.9 8.9 - 10.3 mg/dL   GFR calc non Af Amer 48 (L) >60 mL/min   GFR calc Af Amer 55 (L) >60 mL/min   Anion gap 10 5 - 15    Comment: Performed at [facility] [address], [place], [place] [address]  SARS Coronavirus 2 (CEPHEID - Performed in [facility] hospital lab), Hosp Order     Status: None   Collection Time: 10/17/18 11:05 PM   Specimen: Nasopharyngeal Swab  Result Value Ref Range   SARS Coronavirus 2 NEGATIVE NEGATIVE    Comment: (NOTE) If result is NEGATIVE SARS-CoV-2 target nucleic acids are NOT DETECTED. The SARS-CoV-2 RNA is generally detectable in upper and lower  respiratory specimens during the acute phase of infection. The lowest  concentration of SARS-CoV-2 viral copies this assay can detect is 250  copies / mL. A negative result does not preclude SARS-CoV-2  infection  and should not be used as the sole basis for treatment or other  patient management decisions.  A negative result may occur with  improper specimen collection / handling, submission of specimen other  than nasopharyngeal swab, presence of viral mutation(s) within the  areas targeted by this assay, and inadequate number of viral copies  (<250 copies / mL). A negative result must be combined with clinical  observations, patient history, and epidemiological information. If result is POSITIVE SARS-CoV-2 target nucleic acids are DETECTED. The SARS-CoV-2 RNA is generally detectable in upper and lower  respiratory specimens dur ing the acute phase of infection.  Positive  results are indicative of active infection with SARS-CoV-2.  Clinical  correlation with patient history and other diagnostic information is  necessary to determine patient infection status.  Positive results do  not rule out bacterial infection or co-infection with other viruses. If result is PRESUMPTIVE POSTIVE SARS-CoV-2 nucleic acids MAY BE PRESENT.   A presumptive positive result was obtained on the submitted specimen  and confirmed on repeat testing.  While 2019 novel coronavirus  (SARS-CoV-2) nucleic acids may be present in the submitted sample  additional confirmatory testing may be necessary for epidemiological  and / or clinical management purposes  to differentiate between  SARS-CoV-2 and other Sarbecovirus currently known to infect humans.  If clinically indicated additional testing with an alternate test  methodology [phone]) is advised. The SARS-CoV-2 RNA is generally  detectable in upper and lower respiratory sp ecimens during the acute  phase of infection. The expected result is Negative. Fact Sheet for Patients:  StrictlyIdeas.no Fact Sheet for Healthcare Providers: BankingDealers.co.za This test is not yet approved or cleared by the [place]  FDA and has been authorized for detection and/or diagnosis of SARS-CoV-2 by FDA under an Emergency Use Authorization (EUA).  This EUA will remain in effect (meaning this test can be used) for the duration of the COVID-19 declaration under Section 564(b)(1) of the Act, 21 U.S.C. section 360bbb-3(b)(1), unless the authorization is terminated or revoked sooner. Performed at [facility], [place] [address] [place], [place] [address]   MRSA PCR Screening     Status: None   Collection Time: 10/18/18 12:36 AM   Specimen: Nasopharyngeal  Result Value Ref Range   MRSA by PCR NEGATIVE NEGATIVE    Comment:        The GeneXpert MRSA Assay (FDA approved for NASAL specimens only), is one component of a comprehensive MRSA colonization surveillance program.  It is not intended to diagnose MRSA infection nor to guide or monitor treatment for MRSA infections. Performed at [facility], [place] [address] [place], [place] [address]   Glucose, capillary     Status: Abnormal   Collection Time: 10/18/18 12:55 AM  Result Value Ref Range   Glucose-Capillary 121 (H) 70 - 99 mg/dL  CBC     Status: Abnormal   Collection Time: 10/18/18  1:26 AM  Result Value Ref Range   WBC 7.0 4.0 - 10.5 K/uL   RBC 3.96 (L) 4.22 - 5.81 MIL/uL   Hemoglobin 12.0 (L) 13.0 - 17.0 g/dL   HCT 35.7 (L) 39.0 - 52.0 %   MCV 90.2 80.0 - 100.0 fL   MCH 30.3 26.0 - 34.0 pg   MCHC 33.6 30.0 - 36.0 g/dL   RDW 12.1 11.5 - 15.5 %   Platelets 170 150 - 400 K/uL   nRBC 0.0 0.0 - 0.2 %    Comment: Performed at [facility], [place] [address] [place], [place] [address]  Basic metabolic panel     Status: Abnormal   Collection Time: 10/18/18  1:26 AM  Result Value Ref Range   Sodium 136 135 - 145 mmol/L   Potassium 4.0 3.5 - 5.1 mmol/L   Chloride 105 98 - 111 mmol/L   CO2 24 22 - 32 mmol/L   Glucose, Bld 116 (H) 70 - 99 mg/dL   BUN 14 8 - 23 mg/dL   Creatinine, Ser 1.39 (H) 0.61 - 1.24 mg/dL   Calcium 8.8 (L) 8.9 -  10.3 mg/dL   GFR calc non Af Amer 51 (L) >60 mL/min   GFR calc Af Amer 59 (L) >60 mL/min   Anion gap 7 5 - 15    Comment: Performed at [facility] [address] [place], [place] [address]  Type and screen     Status: None   Collection Time: 10/18/18  1:26 AM  Result Value Ref Range   ABO/RH(D) O POS    Antibody Screen NEG    Sample Expiration      10/21/2018,2359 Performed at [facility], [place] [address] [place], [place] [address]   ABO/Rh     Status: None   Collection Time: 10/18/18  1:26 AM  Result Value Ref Range   ABO/RH(D)      O POS Performed at [facility] [address] [place], [place] [address]   Glucose, capillary     Status: Abnormal   Collection Time: 10/18/18  5:21 AM  Result Value Ref Range   Glucose-Capillary 112 (H) 70 - 99 mg/dL  Glucose, capillary     Status: Abnormal   Collection Time: 10/18/18  7:32 AM  Result Value Ref Range   Glucose-Capillary 112 (H) 70 - 99 mg/dL    Ct Knee Left Wo Contrast  Result Date: 10/17/2018 CLINICAL DATA:  Left knee pain and swelling after falling while mowing lawn. EXAM: CT OF THE LEFT KNEE WITHOUT CONTRAST TECHNIQUE: Multidetector CT imaging of the left knee was performed according to the standard protocol. Multiplanar CT image reconstructions were also generated. COMPARISON:  Radiographs same date FINDINGS: Bones/Joint/Cartilage No evidence of acute fracture or dislocation. The joint spaces are preserved. There is a moderate-size, complex suprapatellar joint effusion without evidence of intra-articular loose body or foreign body. Ligaments Suboptimally assessed by CT. The cruciate ligaments appear grossly intact. Muscles and Tendons The distal quadriceps tendon is poorly visualized and  is likely torn from its patellar insertion. On the reformatted images, the tendon appears to be retracted approximately 2.9 cm. There is no inferior subluxation of the patella or patellar tendon laxity. The lower leg  musculature appears normal. Soft tissues There is prominent suprapatellar soft tissue swelling with ill-defined hematoma anterior to the distal quadriceps tendon and the patella. IMPRESSION: 1. Prominent soft tissue swelling superior to the patella with ill-defined hematoma around the distal quadriceps tendon and patella. Suspected rupture or high-grade partial tear of the distal quadriceps tendon without inferior displacement of the patella. 2. Complex knee joint effusion. 3. No evidence of acute fracture. Electronically Signed   By: Richardean Sale M.D.   On: 10/17/2018 18:08   Dg Chest Port 1 View  Result Date: 10/17/2018 CLINICAL DATA:  Preop for knee surgery EXAM: PORTABLE CHEST 1 VIEW COMPARISON:  November 30, 2014 FINDINGS: There is mild enlargement of the cardiac silhouette. A left-sided pacemaker seen with the lead tip in the right ventricle. The lungs are clear. No large airspace consolidation. No acute osseous findings. Bilateral shoulder arthropathy is seen. IMPRESSION: No acute cardiopulmonary process. Electronically Signed   By: Prudencio Pair M.D.   On: 10/17/2018 23:56   Dg Knee Complete 4 Views Left  Result Date: 10/17/2018 CLINICAL DATA:  73 year old male status post fall onto knee with pain and swelling. EXAM: LEFT KNEE - COMPLETE 4+ VIEW COMPARISON:  None. FINDINGS: Bone mineralization is within normal limits. The patella appears to remain intact although there is severe soft tissue swelling anterior and superior to the patella. There is also evidence of a suprapatellar joint effusion which may be hyperdense. Joint spaces and alignment are preserved. No acute osseous abnormality identified. IMPRESSION: No fracture identified but large hematoma anterior and superior to the patella and evidence of a joint effusion. Electronically Signed   By: Genevie Ann M.D.   On: 10/17/2018 15:42   Dg Knee Complete 4 Views Right  Result Date: 10/17/2018 CLINICAL DATA:  Right knee pain after a fall today.  EXAM: RIGHT KNEE - COMPLETE 4+ VIEW COMPARISON:  None. FINDINGS: No evidence of fracture, dislocation, or joint effusion. No evidence of arthropathy or other focal bone abnormality. Soft tissues are unremarkable. IMPRESSION: Negative. Electronically Signed   By: Logan Bores M.D.   On: 10/17/2018 21:01    Review of Systems  Constitutional: Negative for weight loss.  HENT: Negative for ear discharge, ear pain, hearing loss and tinnitus.   Eyes: Negative for blurred vision, double vision, photophobia and pain.  Respiratory: Negative for cough, sputum production and shortness of breath.   Cardiovascular: Negative for chest pain.  Gastrointestinal: Negative for abdominal pain, nausea and vomiting.  Genitourinary: Negative for dysuria, flank pain, frequency and urgency.  Musculoskeletal: Positive for joint pain (Left knee). Negative for back pain, falls, myalgias and neck pain.  Neurological: Negative for dizziness, tingling, sensory change, focal weakness, loss of consciousness and headaches.  Endo/Heme/Allergies: Does not bruise/bleed easily.  Psychiatric/Behavioral: Negative for depression, memory loss and substance abuse. The patient is not nervous/anxious.    Blood pressure (!) 145/83, pulse 84, temperature 98.4 F (36.9 C), temperature source Oral, resp. rate 16, height 5\' 9"  (1.753 m), weight 86.2 kg, SpO2 97 %. Physical Exam  Constitutional: He appears well-developed and well-nourished. No distress.  HENT:  Head: Normocephalic and atraumatic.  Eyes: Conjunctivae are normal. Right eye exhibits no discharge. Left eye exhibits no discharge. No scleral icterus.  Neck: Normal range of motion.  Cardiovascular: Normal rate  and regular rhythm.  Respiratory: Effort normal. No respiratory distress.  Musculoskeletal:     Comments: LLE No traumatic wounds, ecchymosis, or rash  Mod TTP knee, unable to SLR  Large knee effusion, no ankle effusion  Sens DPN, SPN, TN intact  Motor EHL, ext, flex,  evers 5/5  DP 2+, PT 2+, No significant edema  Neurological: He is alert.  Skin: Skin is warm and dry. He is not diaphoretic.  Psychiatric: He has a normal mood and affect. His behavior is normal.    Assessment/Plan: Left quad tendon rupture -- Plan repair Thursday by Dr. Percell Miller. Multiple medical problems including a.fib, Brugada syndrome,s/p ICD insertion , diabetes mellitus, CAD, COPD, GERD, HTN, monoclonal gammopathy, HLD, seizure disorder, history of CVA -- per primary service    Lisette Abu, PA-C Orthopedic Surgery [phone] 10/18/2018, 11:34 AM

## 2018-10-18 NOTE — Consult Note (Addendum)
Cardiology Consultation:   Patient ID: Brad Brown; [id number]; 02/03/1946   Admit date: 10/17/2018 Date of Consult: 10/18/2018  Primary Care Provider: Jearld Fenton, NP Primary Cardiologist/EP: Cristopher Peru, MD   Patient Profile:   Brad Brown is a 73 y.o. male with a PMH of Brugada syndrome s/p ICD, CAD s/p remote angioplasty in 1980s, atrial fibrillation, HTN, HLD, DM type 2, GERD, MGUS, COPD, CKD stage 3, CVA, and seizure disorder, who is being seen today for the evaluation of preoperative assessment at the request of Dr. Daleen Bo.  History of Present Illness:   Brad Brown was in his usual state of health until yesterday when he experienced a fall while mowing his grass. In the ED he was found to have an acute ill-defined hematoma around the distal quadriceps tendon/patella with suspected rupture or high-grade partial tear of the distal quadricepts tendon, with complex knee joint effusion, and no acute fracture. Orthopedics evaluated patient and plans to take him to the OR 10/20/2018 for surgical repair of suspected tendon rupture.  Patient was last evaluated by cardiology at an outpatient visit with Dr. Lovena Le 05/2018, at which time he was without cardiac complaints. He is s/p ICD with no evidence of recurrent ventricular arrhythmias on device check. He had a NST in 2008 which showed a small inferolateral infarct without ischemia that was felt to be unchanged from previous in 1997. He underwent subsequent LHC in 2008 which showed normal coronary angiography. No echocardiograms available for review.   He reports doing well from a cardiac standpoint. He is fairly active and continues to push mow his lawn. No recent anginal complaints with mowing his lawn, going up a flight or two of stairs, or walking any distance. No complaints of orthopnea, PND, LE edema, dizziness, lightheadedness, syncope, or palpitations.   Hospital course: hypertensive, otherwise VSS. Labs notable for  electrolytes wnl, Cr 1.39, Hgb 12.0, PLT 170. COVID 19 negative. CXR without acute findings. XR L Knee without acute fracutre. CT L knee with an acute ill-defined hematoma around the distal quadriceps tendon/patella with suspected rupture or high-grade partial tear of the distal quadricepts tendon, with complex knee joint effusion, and no acute fracture. EKG with sinus rhythm, rate 84 bpm, 1st degree AV block, LPFB, isolated TWI in III, no STE/D, QTc 441; no significant change from previous. He was admitted to medicine due to significant pain with plans for surgical repair of ruptured quadracepts tendon by Ortho on 10/20/2018. Cardiology asked to evaluate for preoperative assessment  Past Medical History:  Diagnosis Date  . Arthritis   . Atrial fibrillation ([place])   . Automatic implantable cardiac defibrillator in situ   . Borderline diabetes mellitus   . CAD (coronary artery disease)   . Colonic polyp 2004   hyperplastic   . COPD (chronic obstructive pulmonary disease) ([place])   . Diabetes mellitus without complication ([place])   . GERD (gastroesophageal reflux disease)   . History of renal insufficiency syndrome   . HTN (hypertension)   . Hyperlipidemia   . Knee derangement 09/2018   LEFT KNEE  . Monoclonal gammopathy   . 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 Seizure disorder ([place])   . Stroke [facility])     Past Surgical History:  Procedure Laterality Date  . CARDIAC CATHETERIZATION  1987   Showed distal left circumflex 100% occluded   . CARDIAC DEFIBRILLATOR PLACEMENT  08/17/2006   Implantation of a St. Jude single chamber defibrillator  . EP IMPLANTABLE DEVICE  N/A 02/21/2016   Procedure: ICD Generator Changeout;  Surgeon: Evans Lance, MD;  Location: [facility];  Service: Cardiovascular;  Laterality: N/A;  . INGUINAL HERNIA REPAIR Left      Home Medications:  Prior to Admission medications   Medication Sig Start Date End Date Taking? Authorizing Provider   acetaminophen (TYLENOL) 500 MG tablet Take 1,000 mg by mouth every 6 (six) hours as needed (for pain/headache.).   Yes [provider]  ciclopirox (LOPROX) 0.77 % cream Apply 1 application topically 2 (two) times daily as needed (for rash).    Yes [provider]  Clotrimazole 1 % OINT Apply 1 application topically daily. Patient taking differently: Apply 1 application topically daily as needed (for rash).  09/13/18  Yes Baity, Coralie Keens, NP  fluticasone (FLOVENT HFA) 110 MCG/ACT inhaler Inhale 1 puff into the lungs 2 (two) times daily. 12/06/14  Yes Baity, Coralie Keens, NP  glipiZIDE (GLUCOTROL) 5 MG tablet Take 1 tablet (5 mg total) by mouth 2 (two) times daily before a meal. MUST SCHEDULE ANNUAL EXAM 03/04/16  Yes Jearld Fenton, NP  levETIRAcetam (KEPPRA) 500 MG tablet Take 500-1,000 mg by mouth See admin instructions. Take one tablet by mouth in the morning and two tablets by mouth at night   Yes [provider]  losartan (COZAAR) 100 MG tablet TAKE 1 TABLET BY MOUTH EVERY DAY Patient taking differently: Take 100 mg by mouth daily.  08/03/16  Yes Jearld Fenton, NP  metoprolol tartrate (LOPRESSOR) 25 MG tablet Take 12.5 mg by mouth 2 (two) times daily. 07/21/12  Yes Evans Lance, MD  Multiple Vitamin (MULTIVITAMIN) tablet Take 1 tablet by mouth daily.     Yes [provider]  naftifine (NAFTIN) 1 % cream Apply 1 application topically 2 (two) times daily as needed (For  infection between toes).    Yes [provider]  Niacin, Antihyperlipidemic, 500 MG TABS Take 500 mg by mouth daily. 02/28/14  Yes Jearld Fenton, NP  NIFEdipine (PROCARDIA-XL/ADALAT CC) 30 MG 24 hr tablet Take 30 mg by mouth daily.   Yes [provider]  Omega-3 Fatty Acids (FISH OIL) 1200 MG CAPS Take 1,200 mg by mouth daily.    Yes [provider]  Rivaroxaban (XARELTO) 20 MG TABS tablet Take 20 mg by mouth daily.    Yes [provider]  simvastatin (ZOCOR) 20  MG tablet Take 10 mg by mouth at bedtime.    Yes [provider]  SYMBICORT 160-4.5 MCG/ACT inhaler INHALE 2 PUFFS INTO THE LUNGS 2 (TWO) TIMES DAILY. 04/22/16  Yes Noralee Space, MD  tamsulosin (FLOMAX) 0.4 MG CAPS capsule Take 0.4 mg by mouth daily. 02/04/15  Yes [provider]    Inpatient Medications: Scheduled Meds: . insulin aspart  0-9 Units Subcutaneous Q4H  . levETIRAcetam  1,000 mg Oral QPM  . levETIRAcetam  500 mg Oral q morning - 10a  . losartan  100 mg Oral Daily  . metoprolol tartrate  12.5 mg Oral BID  . mometasone-formoterol  2 puff Inhalation BID  . NIFEdipine  30 mg Oral Daily  . simvastatin  10 mg Oral QHS  . tamsulosin  0.4 mg Oral Daily   Continuous Infusions: . methocarbamol (ROBAXIN) IV     PRN Meds: HYDROcodone-acetaminophen, methocarbamol **OR** methocarbamol (ROBAXIN) IV, morphine injection  Allergies:    Allergies  Allergen Reactions  . Lipitor [Atorvastatin] Rash    Social History:   Social History  Socioeconomic History  . Marital status: Single    Spouse name: Not on file  . Number of children: 1  . Years of education: Not on file  . Highest education level: Not on file  Occupational History  . Occupation: retired    Fish farm manager: RETIRED  Social Needs  . Financial resource strain: Not on file  . Food insecurity    Worry: Not on file    Inability: Not on file  . Transportation needs    Medical: Not on file    Non-medical: Not on file  Tobacco Use  . Smoking status: Former Smoker    Packs/day: 0.50    Years: 15.00    Pack years: 7.50    Types: Cigarettes    Quit date: 03/23/2004    Years since quitting: 14.5  . Smokeless tobacco: Never Used  Substance and Sexual Activity  . Alcohol use: Yes    Alcohol/week: 1.0 standard drinks    Types: 1 Glasses of wine per week    Comment: rare--wine  . Drug use: No  . Sexual activity: Not on file  Lifestyle  . Physical activity    Days per week: Not on file    Minutes  per session: Not on file  . Stress: Not on file  Relationships  . Social Herbalist on phone: Not on file    Gets together: Not on file    Attends religious service: Not on file    Active member of club or organization: Not on file    Attends meetings of clubs or organizations: Not on file    Relationship status: Not on file  . Intimate partner violence    Fear of current or ex partner: Not on file    Emotionally abused: Not on file    Physically abused: Not on file    Forced sexual activity: Not on file  Other Topics Concern  . Not on file  Social History Narrative   ICD-St. Jude  Remote-Yes    Family History:    Family History  Problem Relation Age of Onset  . Colon cancer Brother   . Heart attack Mother   . Heart attack Brother      ROS:  Please see the history of present illness.   All other ROS reviewed and negative.     Physical Exam/Data:   Vitals:   10/17/18 2347 10/18/18 0102 10/18/18 0406 10/18/18 0756  BP: (!) 144/90 (!) 156/89 131/81 (!) 145/83  Pulse: 78 85 83 84  Resp: 16 17 16 16   Temp: 98.2 F (36.8 C) 98.4 F (36.9 C) 98.4 F (36.9 C) 98.4 F (36.9 C)  TempSrc: Oral Oral Oral Oral  SpO2: 99% 99% 95% 97%  Weight:      Height:        Intake/Output Summary (Last 24 hours) at 10/18/2018 1106 Last data filed at 10/18/2018 0900 Gross per 24 hour  Intake 240 ml  Output 350 ml  Net -110 ml   Filed Weights   10/17/18 1357  Weight: 86.2 kg   Body mass index is 28.06 kg/m.  General:  Well nourished, well developed, laying in bed in no acute distress HEENT: sclera anicteric  Neck: no JVD Vascular: No carotid bruits; distal pulses 2+ bilaterally Cardiac:  normal S1, S2; RRR; no murmurs, rubs, or gallops Lungs:  clear to auscultation bilaterally, no wheezing, rhonchi or rales  Abd: NABS, soft, nontender, no hepatomegaly Ext: no distal edema Musculoskeletal:  LLE  in leg immobilizer Skin: warm and dry  Neuro:  CNs 2-12 intact, no  focal abnormalities noted Psych:  Normal affect   EKG:  The EKG was personally reviewed and demonstrates:  sinus rhythm, rate 84 bpm, 1st degree AV block, LPFB, isolated TWI in III, no STE/D, QTc 441; no significant change from previous  Relevant CV Studies: Left heart catheterization 2008: RESULTS:  1. The aortic pressure was 139/76 with a mean of 102, and left neck pressure was 139/12.  2. The left main coronary artery and left vein were free of disease.  3. Left descending artery gave rise to a large diagonal branches, 2 septal perforators and 2 small diagonal branches.  These vessels were free of significant disease.  4. The circumflex artery gave rise to a marginal branch and 2 posterolateral branches.  These vessels were free of significant disease.  5. The right coronary artery was a moderate-sized vessel and gave rise to right ventricular branch, posterior descending branch and a posterolateral branch.  These vessels were free of significant disease.  6. No left ventriculogram was performed.   CONCLUSION:  Normal coronary angiography.  Laboratory Data:  Chemistry Recent Labs  Lab 10/17/18 2148 10/18/18 0126  NA 137 136  K 4.2 4.0  CL 105 105  CO2 22 24  GLUCOSE 118* 116*  BUN 16 14  CREATININE 1.47* 1.39*  CALCIUM 8.9 8.8*  GFRNONAA 48* 51*  GFRAA 55* 59*  ANIONGAP 10 7    No results for input(s): PROT, ALBUMIN, AST, ALT, ALKPHOS, BILITOT in the last 168 hours. Hematology Recent Labs  Lab 10/17/18 2148 10/18/18 0126  WBC 8.6 7.0  RBC 4.15* 3.96*  HGB 12.7* 12.0*  HCT 38.3* 35.7*  MCV 92.3 90.2  MCH 30.6 30.3  MCHC 33.2 33.6  RDW 12.2 12.1  PLT 173 170   Cardiac EnzymesNo results for input(s): TROPONINI in the last 168 hours. No results for input(s): TROPIPOC in the last 168 hours.  BNPNo results for input(s): BNP, PROBNP in the last 168 hours.  DDimer No results for input(s): DDIMER in the last 168 hours.  Radiology/Studies:  Ct Knee Left Wo Contrast   Result Date: 10/17/2018 CLINICAL DATA:  Left knee pain and swelling after falling while mowing lawn. EXAM: CT OF THE LEFT KNEE WITHOUT CONTRAST TECHNIQUE: Multidetector CT imaging of the left knee was performed according to the standard protocol. Multiplanar CT image reconstructions were also generated. COMPARISON:  Radiographs same date FINDINGS: Bones/Joint/Cartilage No evidence of acute fracture or dislocation. The joint spaces are preserved. There is a moderate-size, complex suprapatellar joint effusion without evidence of intra-articular loose body or foreign body. Ligaments Suboptimally assessed by CT. The cruciate ligaments appear grossly intact. Muscles and Tendons The distal quadriceps tendon is poorly visualized and is likely torn from its patellar insertion. On the reformatted images, the tendon appears to be retracted approximately 2.9 cm. There is no inferior subluxation of the patella or patellar tendon laxity. The lower leg musculature appears normal. Soft tissues There is prominent suprapatellar soft tissue swelling with ill-defined hematoma anterior to the distal quadriceps tendon and the patella. IMPRESSION: 1. Prominent soft tissue swelling superior to the patella with ill-defined hematoma around the distal quadriceps tendon and patella. Suspected rupture or high-grade partial tear of the distal quadriceps tendon without inferior displacement of the patella. 2. Complex knee joint effusion. 3. No evidence of acute fracture. Electronically Signed   By: Richardean Sale M.D.   On: 10/17/2018 18:08   Dg  Chest Port 1 View  Result Date: 10/17/2018 CLINICAL DATA:  Preop for knee surgery EXAM: PORTABLE CHEST 1 VIEW COMPARISON:  November 30, 2014 FINDINGS: There is mild enlargement of the cardiac silhouette. A left-sided pacemaker seen with the lead tip in the right ventricle. The lungs are clear. No large airspace consolidation. No acute osseous findings. Bilateral shoulder arthropathy is seen.  IMPRESSION: No acute cardiopulmonary process. Electronically Signed   By: Prudencio Pair M.D.   On: 10/17/2018 23:56   Dg Knee Complete 4 Views Left  Result Date: 10/17/2018 CLINICAL DATA:  73 year old male status post fall onto knee with pain and swelling. EXAM: LEFT KNEE - COMPLETE 4+ VIEW COMPARISON:  None. FINDINGS: Bone mineralization is within normal limits. The patella appears to remain intact although there is severe soft tissue swelling anterior and superior to the patella. There is also evidence of a suprapatellar joint effusion which may be hyperdense. Joint spaces and alignment are preserved. No acute osseous abnormality identified. IMPRESSION: No fracture identified but large hematoma anterior and superior to the patella and evidence of a joint effusion. Electronically Signed   By: Genevie Ann M.D.   On: 10/17/2018 15:42   Dg Knee Complete 4 Views Right  Result Date: 10/17/2018 CLINICAL DATA:  Right knee pain after a fall today. EXAM: RIGHT KNEE - COMPLETE 4+ VIEW COMPARISON:  None. FINDINGS: No evidence of fracture, dislocation, or joint effusion. No evidence of arthropathy or other focal bone abnormality. Soft tissues are unremarkable. IMPRESSION: Negative. Electronically Signed   By: Logan Bores M.D.   On: 10/17/2018 21:01    Assessment and Plan:   1. Preoperative assessment: patient presented after a mechanical fall resulting in rupture of his quadricepts tendon with plans for surgical repair on 10/20/2018 with Ortho. He has a history of CVA but no CAD or CHF. He is on oral medications for glycemic control and Cr is <2. He can easily complete 4 METs without anginal complaints. Last ischemic evaluation was a LHC in 2008 which showed normal coronary angiography. No volume overload complaints to suggest underlying CHF and he appears euvolemic on exam.  - Based on the revised cardiac risk index, this patient has a score of 2 (CAD and CVA history), with a 10.1% risk of adverse cardiac events in  the perioperative setting - Patient is at acceptable risk to proceed with surgery without further cardiac work-up.  - Can hold xarelto for surgery with plans to restart as soon as cleared to do so by ortho.   2. History of brugada syndrome s/p ICD: follows with Dr. Lovena Le. Last device check 08/2018 stable per Dr. Lovena Le.  - Continue routine outpatient follow-up.  3. CAD: patient reports remote MI in 50s managed with angioplasty. Last ischemic evaluation was a LHC in 2008 which showed normal coronary arteries. Not on aspirin given need for xarelto. No anginal complaints - Continue statin  4. Paroxysmal atrial fibrillation: EKG with sinus rhythm this admission.  - Continue metoprolol 25mg  BID for rate control - Xarelto on hold for upcoming ortho surgery - resume when cleared to do so by ortho  5. HTN: BP stable - Continue losartan, metoprolol, and nifedipine   6. HLD: LDL 47 03/2018. - Continue statin  For questions or updates, please contact [place] Please consult [email or web address] for contact info under Cardiology/STEMI.   Signed, Abigail Butts, PA-C  10/18/2018 11:06 AM [phone]  I have personally seen and examined this patient with Roby Lofts, PA-C. I agree with the  assessment and plan as outlined above. Mr. Abreu is a 73 yo male with history of with CAD, Brugada syndrome s/p ICD, atrial fibrillation, HTN, HLD, DM type 2, GERD, MGUS, COPD, CVA, and seizure disorder, who was admitted post fall sustaining a soft tissue injury of the left leg/knee and will require surgical correction. We are asked to see to define his operative risk from a cardiac standpoint. He is followed closely in our office by Dr. Lovena Le. No known CAD. ICD interrogations has been normal. He has no c/o chest pain or dyspnea with exertion.  Labs reviewed by me.  EKG personally reviewed by me and shows sinus rhythm with 1st degree AV block. No ischemic changes.  My exam:  General: Well developed,  well nourished, NAD  HEENT: OP clear, mucus membranes moist  SKIN: warm, dry. No rashes.  Neuro: No focal deficits  Musculoskeletal: Muscle strength 5/5 all ext  Psychiatric: Mood and affect normal  Neck: No JVD, no carotid bruits, no thyromegaly, no lymphadenopathy.  Lungs:Clear bilaterally, no wheezes, rhonci, crackles  Cardiovascular: Regular rate and rhythm. No murmurs, gallops or rubs.  Abdomen:Soft. Bowel sounds present. Non-tender.  Extremities: No lower extremity edema. Immobilizer on left leg.   Plan: He has no signs or symptoms of unstable cardiac disease. He can proceed with is planned surgical procedure without further cardiac workup. The device rep can be contacted to have the ICD turned off for his procedure. Please call with questions.   Lauree Chandler  10/18/2018 1:58 PM

## 2018-10-18 NOTE — Progress Notes (Signed)
[facility] PROGRESS NOTE  Brad Brown MBT:[id number] DOB: April 28, 1945 DOA: 10/17/2018 PCP: Jearld Fenton, NP  Brief summary   73 y.o. male with medical history significant of a.fib, Brugada syndrome,s/p ICD insertion , diabetes mellitus, CAD, COPD, CKD, GERD, HTN, monoclonal gammopathy, HLD, seizure disorder, history of CVA. Presented with left knee pain and swelling after falling downhill while mowing his lawn.  Admitted for left quadricep tendon rupture with hemarthrosis  Assessment/Plan:  Acute traumatic internal derangement of knee. Hematoma. Tendon rupture. Cont pain control. Holding anticoagulation. management as per orthopedics,  plan to operate in a few days -pend cardiology consult for further pre-op clearance given extensive cardiac disease  Type 2 diabetes mellitus. Monitor on ISS  A fib. PPM. Hx of Brugada syndrome. Home regimen-xarelto. appears to be on BB+nifedipine. No acute cardiopulmonary symptoms. Holding xarelto due to bleed, Pend surgery.   Hypertension. BP is stable. 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 stable continue home medications. Losartan, BB, nifedipine. Monitor    Hx of seizure DO. Stable on keppra    Code Status: full Family Communication: d/w patient, RN (indicate person spoken with, relationship, and if by phone, the number) Disposition Plan: remains inpatient    Consultants:  Ortho   Procedures:  none  Antibiotics: Anti-infectives (From admission, onward)   None        (indicate start date, and stop date if known)  HPI/Subjective: No acute distress. Reports feeling well. Holding anticoagulation. Wants to eat. No acute issues   Objective: Vitals:   10/18/18 0406 10/18/18 0756  BP: 131/81 (!) 145/83  Pulse: 83 84  Resp: 16 16  Temp: 98.4 F (36.9 C) 98.4 F (36.9 C)  SpO2: 95% 97%   No intake or output data in the 24 hours ending  10/18/18 1023 Filed Weights   10/17/18 1357  Weight: 86.2 kg    Exam:   General:  No distress   Cardiovascular: s1,s2 rrr  Respiratory: CTA BL  Abdomen: soft, nt   Musculoskeletal: no pedal edema   Data Reviewed: Basic Metabolic Panel: Recent Labs  Lab 10/17/18 2148 10/18/18 0126  NA 137 136  K 4.2 4.0  CL 105 105  CO2 22 24  GLUCOSE 118* 116*  BUN 16 14  CREATININE 1.47* 1.39*  CALCIUM 8.9 8.8*   Liver Function Tests: No results for input(s): AST, ALT, ALKPHOS, BILITOT, PROT, ALBUMIN in the last 168 hours. No results for input(s): LIPASE, AMYLASE in the last 168 hours. No results for input(s): AMMONIA in the last 168 hours. CBC: Recent Labs  Lab 10/17/18 2148 10/18/18 0126  WBC 8.6 7.0  HGB 12.7* 12.0*  HCT 38.3* 35.7*  MCV 92.3 90.2  PLT 173 170   Cardiac Enzymes: No results for input(s): CKTOTAL, CKMB, CKMBINDEX, TROPONINI in the last 168 hours. BNP (last 3 results) No results for input(s): BNP in the last 8760 hours.  ProBNP (last 3 results) No results for input(s): PROBNP in the last 8760 hours.  CBG: Recent Labs  Lab 10/18/18 0055 10/18/18 0521 10/18/18 0732  GLUCAP 121* 112* 112*    Recent Results (from the past 240 hour(s))  SARS Coronavirus 2 (CEPHEID - Performed in [place] hospital lab), Hosp Order     Status: None   Collection Time: 10/17/18 11:05 PM   Specimen: Nasopharyngeal Swab  Result Value Ref Range Status   SARS Coronavirus 2 NEGATIVE NEGATIVE Final    Comment: (NOTE) If result  is NEGATIVE SARS-CoV-2 target nucleic acids are NOT DETECTED. The SARS-CoV-2 RNA is generally detectable in upper and lower  respiratory specimens during the acute phase of infection. The lowest  concentration of SARS-CoV-2 viral copies this assay can detect is 250  copies / mL. A negative result does not preclude SARS-CoV-2 infection  and should not be used as the sole basis for treatment or other  patient management decisions.  A negative  result may occur with  improper specimen collection / handling, submission of specimen other  than nasopharyngeal swab, presence of viral mutation(s) within the  areas targeted by this assay, and inadequate number of viral copies  (<250 copies / mL). A negative result must be combined with clinical  observations, patient history, and epidemiological information. If result is POSITIVE SARS-CoV-2 target nucleic acids are DETECTED. The SARS-CoV-2 RNA is generally detectable in upper and lower  respiratory specimens dur ing the acute phase of infection.  Positive  results are indicative of active infection with SARS-CoV-2.  Clinical  correlation with patient history and other diagnostic information is  necessary to determine patient infection status.  Positive results do  not rule out bacterial infection or co-infection with other viruses. If result is PRESUMPTIVE POSTIVE SARS-CoV-2 nucleic acids MAY BE PRESENT.   A presumptive positive result was obtained on the submitted specimen  and confirmed on repeat testing.  While 2019 novel coronavirus  (SARS-CoV-2) nucleic acids may be present in the submitted sample  additional confirmatory testing may be necessary for epidemiological  and / or clinical management purposes  to differentiate between  SARS-CoV-2 and other Sarbecovirus currently known to infect humans.  If clinically indicated additional testing with an alternate test  methodology [phone]) is advised. The SARS-CoV-2 RNA is generally  detectable in upper and lower respiratory sp ecimens during the acute  phase of infection. The expected result is Negative. Fact Sheet for Patients:  StrictlyIdeas.no Fact Sheet for Healthcare Providers: BankingDealers.co.za This test is not yet approved or cleared by the [place] FDA and has been authorized for detection and/or diagnosis of SARS-CoV-2 by FDA under an Emergency Use Authorization  (EUA).  This EUA will remain in effect (meaning this test can be used) for the duration of the COVID-19 declaration under Section 564(b)(1) of the Act, 21 U.S.C. section 360bbb-3(b)(1), unless the authorization is terminated or revoked sooner. Performed at [facility], [place] [address], [place], [place] [address]   MRSA PCR Screening     Status: None   Collection Time: 10/18/18 12:36 AM   Specimen: Nasopharyngeal  Result Value Ref Range Status   MRSA by PCR NEGATIVE NEGATIVE Final    Comment:        The GeneXpert MRSA Assay (FDA approved for NASAL specimens only), is one component of a comprehensive MRSA colonization surveillance program. It is not intended to diagnose MRSA infection nor to guide or monitor treatment for MRSA infections. Performed at [facility], [place] [address], [place], [place] [address]      Studies: Ct Knee Left Wo Contrast  Result Date: 10/17/2018 CLINICAL DATA:  Left knee pain and swelling after falling while mowing lawn. EXAM: CT OF THE LEFT KNEE WITHOUT CONTRAST TECHNIQUE: Multidetector CT imaging of the left knee was performed according to the standard protocol. Multiplanar CT image reconstructions were also generated. COMPARISON:  Radiographs same date FINDINGS: Bones/Joint/Cartilage No evidence of acute fracture or dislocation. The joint spaces are preserved. There is a moderate-size, complex suprapatellar joint effusion  without evidence of intra-articular loose body or foreign body. Ligaments Suboptimally assessed by CT. The cruciate ligaments appear grossly intact. Muscles and Tendons The distal quadriceps tendon is poorly visualized and is likely torn from its patellar insertion. On the reformatted images, the tendon appears to be retracted approximately 2.9 cm. There is no inferior subluxation of the patella or patellar tendon laxity. The lower leg musculature appears normal. Soft tissues There is prominent suprapatellar soft tissue swelling  with ill-defined hematoma anterior to the distal quadriceps tendon and the patella. IMPRESSION: 1. Prominent soft tissue swelling superior to the patella with ill-defined hematoma around the distal quadriceps tendon and patella. Suspected rupture or high-grade partial tear of the distal quadriceps tendon without inferior displacement of the patella. 2. Complex knee joint effusion. 3. No evidence of acute fracture. Electronically Signed   By: Richardean Sale M.D.   On: 10/17/2018 18:08   Dg Chest Port 1 View  Result Date: 10/17/2018 CLINICAL DATA:  Preop for knee surgery EXAM: PORTABLE CHEST 1 VIEW COMPARISON:  November 30, 2014 FINDINGS: There is mild enlargement of the cardiac silhouette. A left-sided pacemaker seen with the lead tip in the right ventricle. The lungs are clear. No large airspace consolidation. No acute osseous findings. Bilateral shoulder arthropathy is seen. IMPRESSION: No acute cardiopulmonary process. Electronically Signed   By: Prudencio Pair M.D.   On: 10/17/2018 23:56   Dg Knee Complete 4 Views Left  Result Date: 10/17/2018 CLINICAL DATA:  73 year old male status post fall onto knee with pain and swelling. EXAM: LEFT KNEE - COMPLETE 4+ VIEW COMPARISON:  None. FINDINGS: Bone mineralization is within normal limits. The patella appears to remain intact although there is severe soft tissue swelling anterior and superior to the patella. There is also evidence of a suprapatellar joint effusion which may be hyperdense. Joint spaces and alignment are preserved. No acute osseous abnormality identified. IMPRESSION: No fracture identified but large hematoma anterior and superior to the patella and evidence of a joint effusion. Electronically Signed   By: Genevie Ann M.D.   On: 10/17/2018 15:42   Dg Knee Complete 4 Views Right  Result Date: 10/17/2018 CLINICAL DATA:  Right knee pain after a fall today. EXAM: RIGHT KNEE - COMPLETE 4+ VIEW COMPARISON:  None. FINDINGS: No evidence of fracture,  dislocation, or joint effusion. No evidence of arthropathy or other focal bone abnormality. Soft tissues are unremarkable. IMPRESSION: Negative. Electronically Signed   By: Logan Bores M.D.   On: 10/17/2018 21:01    Scheduled Meds: . insulin aspart  0-9 Units Subcutaneous Q4H  . levETIRAcetam  1,000 mg Oral QPM  . levETIRAcetam  500 mg Oral q morning - 10a  . losartan  100 mg Oral Daily  . metoprolol tartrate  12.5 mg Oral BID  . mometasone-formoterol  2 puff Inhalation BID  . NIFEdipine  30 mg Oral Daily  . simvastatin  10 mg Oral QHS  . tamsulosin  0.4 mg Oral Daily   Continuous Infusions: . methocarbamol (ROBAXIN) IV      Active Problems:   HLD (hyperlipidemia)   Seizures (HCC)   Type 2 diabetes mellitus with stage 1 chronic kidney disease, without long-term current use of insulin (HCC)   Automatic implantable cardioverter-defibrillator in situ   Hypertension   Brugada syndrome   MGUS (monoclonal gammopathy of unknown significance)   Atrial fibrillation (HCC)   GERD (gastroesophageal reflux disease)   BPH (benign prostatic hyperplasia)   Fall at home, initial encounter   Acute  traumatic internal derangement of knee, initial encounter   Acute traumatic internal derangement of left knee    Time spent: >25 minutes     Kinnie Feil  [facility] Pager [phone]. If 7PM-7AM, please contact night-coverage at [email or web address], password [facility] 10/18/2018, 10:23 AM  LOS: 1 day

## 2018-10-19 DIAGNOSIS — S83105A Unspecified dislocation of left knee, initial encounter: Secondary | ICD-10-CM

## 2018-10-19 LAB — COMPREHENSIVE METABOLIC PANEL
ALT: 18 U/L (ref 0–44)
AST: 17 U/L (ref 15–41)
Albumin: 3.7 g/dL (ref 3.5–5.0)
Alkaline Phosphatase: 44 U/L (ref 38–126)
Anion gap: 8 (ref 5–15)
BUN: 14 mg/dL (ref 8–23)
CO2: 24 mmol/L (ref 22–32)
Calcium: 8.9 mg/dL (ref 8.9–10.3)
Chloride: 104 mmol/L (ref 98–111)
Creatinine, Ser: 1.5 mg/dL — ABNORMAL HIGH (ref 0.61–1.24)
GFR calc Af Amer: 54 mL/min — ABNORMAL LOW (ref >60)
GFR calc non Af Amer: 46 mL/min — ABNORMAL LOW (ref >60)
Glucose, Bld: 146 mg/dL — ABNORMAL HIGH (ref 70–99)
Potassium: 4.2 mmol/L (ref 3.5–5.1)
Sodium: 136 mmol/L (ref 135–145)
Total Bilirubin: 1.4 mg/dL — ABNORMAL HIGH (ref 0.3–1.2)
Total Protein: 6.9 g/dL (ref 6.5–8.1)

## 2018-10-19 LAB — MAGNESIUM: Magnesium: 1.9 mg/dL (ref 1.7–2.4)

## 2018-10-19 LAB — CBC WITH DIFFERENTIAL/PLATELET
Abs Immature Granulocytes: 0.03 10*3/uL (ref 0.00–0.07)
Basophils Absolute: 0 10*3/uL (ref 0.0–0.1)
Basophils Relative: 1 %
Eosinophils Absolute: 0.1 10*3/uL (ref 0.0–0.5)
Eosinophils Relative: 1 %
HCT: 35.8 % — ABNORMAL LOW (ref 39.0–52.0)
Hemoglobin: 11.9 g/dL — ABNORMAL LOW (ref 13.0–17.0)
Immature Granulocytes: 0 %
Lymphocytes Relative: 18 %
Lymphs Abs: 1.4 10*3/uL (ref 0.7–4.0)
MCH: 30.5 pg (ref 26.0–34.0)
MCHC: 33.2 g/dL (ref 30.0–36.0)
MCV: 91.8 fL (ref 80.0–100.0)
Monocytes Absolute: 0.7 10*3/uL (ref 0.1–1.0)
Monocytes Relative: 9 %
Neutro Abs: 5.5 10*3/uL (ref 1.7–7.7)
Neutrophils Relative %: 71 %
Platelets: 167 10*3/uL (ref 150–400)
RBC: 3.9 MIL/uL — ABNORMAL LOW (ref 4.22–5.81)
RDW: 12.4 % (ref 11.5–15.5)
WBC: 7.7 10*3/uL (ref 4.0–10.5)
nRBC: 0 % (ref 0.0–0.2)

## 2018-10-19 LAB — PHOSPHORUS: Phosphorus: 3.9 mg/dL (ref 2.5–4.6)

## 2018-10-19 LAB — GLUCOSE, CAPILLARY
Glucose-Capillary: 117 mg/dL — ABNORMAL HIGH (ref 70–99)
Glucose-Capillary: 111 mg/dL — ABNORMAL HIGH (ref 70–99)
Glucose-Capillary: 121 mg/dL — ABNORMAL HIGH (ref 70–99)
Glucose-Capillary: 136 mg/dL — ABNORMAL HIGH (ref 70–99)
Glucose-Capillary: 161 mg/dL — ABNORMAL HIGH (ref 70–99)

## 2018-10-19 MED ORDER — SENNOSIDES-DOCUSATE SODIUM 8.6-50 MG PO TABS
1.0000 | ORAL_TABLET | Freq: Two times a day (BID) | ORAL | Status: DC
  Administered 2018-10-19 (×2): 1 via ORAL
  Filled 2018-10-19 (×2): qty 1

## 2018-10-19 MED ORDER — POLYETHYLENE GLYCOL 3350 17 G PO PACK
17.0000 g | PACK | Freq: Two times a day (BID) | ORAL | Status: DC
  Administered 2018-10-19 – 2018-10-21 (×5): 17 g via ORAL
  Filled 2018-10-19 (×5): qty 1

## 2018-10-19 MED ORDER — BISACODYL 10 MG RE SUPP
10.0000 mg | Freq: Every day | RECTAL | Status: DC | PRN
  Administered 2018-10-21: 10 mg via RECTAL
  Filled 2018-10-19: qty 1

## 2018-10-19 NOTE — Plan of Care (Signed)

## 2018-10-19 NOTE — Progress Notes (Signed)
CSW acknowledges SNF consult pending surgery tomorrow and PT/OT recs following.   [place], [place]

## 2018-10-19 NOTE — Plan of Care (Signed)

## 2018-10-19 NOTE — Progress Notes (Addendum)
PROGRESS NOTE    Brad Brown  NUU:[id number] DOB: [birth year] DOA: 10/17/2018 PCP: Jearld Fenton, NP   Brief Narrative:  The patient is a 73 year old male with a past medical history significant for but not limited to atrial fibrillation, history of Brugada syndrome status post ICD insertion, diabetes mellitus, CAD, COPD, CKD, GERD, hypertension, history of mild cardiomyopathy, hyperlipidemia, seizure disorder, and history of CVA as well as other comorbidities who presents with left knee pain and swelling after falling downhill mowing his lawn and landing on his left leg.  He was admitted for a left quadriceps tendon rupture with hemarthrosis and Cardiology was consulted for further evaluation recommendations for preoperative assessment and it was felt that he was an acceptable risk to proceed with surgery without further cardiac work-up.  Dr. Angelena Form recommending holding Xarelto for surgery with plans to restart as soon as he is cleared by Ortho.  Orthopedic surgery evaluated and recommending repair on Thursday by Dr. Percell Miller.  Assessment & Plan:   Active Problems:   HLD (hyperlipidemia)   Seizures (HCC)   Type 2 diabetes mellitus with stage 1 chronic kidney disease, without long-term current use of insulin (HCC)   Automatic implantable cardioverter-defibrillator in situ   Hypertension   Brugada syndrome   MGUS (monoclonal gammopathy of unknown significance)   Atrial fibrillation (HCC)   GERD (gastroesophageal reflux disease)   BPH (benign prostatic hyperplasia)   Fall at home, initial encounter   Acute traumatic internal derangement of knee, initial encounter   Acute traumatic internal derangement of left knee   AcuteTraumatic Internal Derangement of knee. Hematoma. Tendon rupture -Continue Pain control with Hydrocodone-Acetaminophen 1-2 po q6hprn Moderate Pain and with methocarbamol 5 mg p.o./IV every 6 as needed -Holding anticoagulation. management as per orthopedics, plan  to operate tomorrow -Cardiology consulted for further pre-op clearance given extensive cardiac disease and he is an acceptable risk for Surgical Intervention   Type 2 Diabetes Mellitus -Continue to Monitor on Sensitive Novolog SSI AC -CBG's ranging from 111-182  A Fib. s/p PPM Hx of Brugada syndrome.  -Home regimen includes Anticoagulation with Xarelto andAppears to be on BB+nifedipine.  -No acute cardiopulmonary symptoms. -Holding xarelto due to bleed and pending Surgical Intervention -Cardiology recommending Resuming as soon as cleared by Orthopedic Surgery  Hypertension -BP is stable. -continue home medications with Losartan 100 mg po daily, BB with Metoprolol Tartrate 12.5 mg po BID, Nifedipine 30 mg po Daily -Continue to Monitor    HLD -C/w Simvastatin 10 mg po Daily   MGUS(monoclonal gammopathy of unknown significance)  -chronic currently stable  BPH -Stable  -continue home medications of Tamsulosin 0.4 mg po Daily     History of Seizure Disorder -Stable -C/w Home Keppra of 1000 mg po qEvening and 500 mg po Morning   Constipation -Bowel regimen was started with senna docusate 1 tab p.o. twice daily, MiraLAX 17 g p.o. twice daily, as well as bisacodyl 10 mg rectal suppository for moderate constipation -If necessary will order an enema  CKD stage III -Patient's BUN/creatinine appears similar to priors and is 14/1.50 Continue monitor trend-avoid nephrotoxic medications if possible but we will continue Losartan at this time. -Avoid contrast dyes, and hypotension -Continue monitor and trend renal function -Repeat CMP in a.m.  Hyperbilirubinemia -Mild at 1.4 -Continue to monitor and trend and this is likely reactive -Repeat CMP in a.m.  Normocytic Anemia/Anemia of Chronic Kidney Disease -Hemoglobin/hematocrit remains relatively stable and went from 12.7/30.3 is now 11.9/35.8 next-continue monitor for signs and symptoms  of bleeding; currently no overt bleeding  noted -Check Anemia Panel in a.m. -Repeat CBC  DVT prophylaxis: SCDs; currently anticoagulation is being held Code Status: FULL CODE  Family Communication: No family present at bedside  Disposition Plan: Pending further surgical intervention and evaluation  Consultants:   Cardiology  Orthopedic Surgery    Procedures: None   Antimicrobials:  Anti-infectives (From admission, onward)   None     Subjective: Seen and examined at bedside and he had no complaints except that he is feeling constipated not had a bowel movement for days.  No nausea or vomiting.  Denied chest pain, lightheadedness or dizziness.  No other concerns or complaints at this time and states that "it will take a stick of dynamite to get his bowels moving".  Objective: Vitals:   10/18/18 2125 10/19/18 0407 10/19/18 0806 10/19/18 1646  BP: 126/70 114/72 122/67 110/64  Pulse: 92 79 82 90  Resp:  17 16 19   Temp:  98.3 F (36.8 C) 98.1 F (36.7 C) 99 F (37.2 C)  TempSrc:  Oral Oral Oral  SpO2:  95% 98% 97%  Weight:      Height:        Intake/Output Summary (Last 24 hours) at 10/19/2018 1749 Last data filed at 10/18/2018 2340 Gross per 24 hour  Intake 200 ml  Output 600 ml  Net -400 ml   Filed Weights   10/17/18 1357  Weight: 86.2 kg   Examination: Physical Exam:  Constitutional: WN/WD overweight AAM in NAD and appears calm   Eyes: Lids and conjunctivae normal, sclerae anicteric  ENMT: External Ears, Nose appear normal. Grossly normal hearing. Mucous membranes are moist.  Neck: Appears normal, supple, no cervical masses, normal ROM, no appreciable thyromegaly; no JVD Respiratory: Diminished to auscultation bilaterally, no wheezing, rales, rhonchi or crackles. Normal respiratory effort and patient is not tachypenic. No accessory muscle use.   Cardiovascular: Irregularly Irregular, no murmurs / rubs / gallops. S1 and S2 auscultated.  Abdomen: Soft, non-tender, Distended. No masses palpated. No  appreciable hepatosplenomegaly. Bowel sounds positive x4.  GU: Deferred. Musculoskeletal: No clubbing / cyanosis of digits/nails. Left Leg immobilized Skin: No rashes, lesions, ulcers on a limited skin evaluation. No induration; Warm and dry.  Neurologic: CN 2-12 grossly intact with no focal deficits. Romberg sign cerebellar and reflexes not assessed.  Psychiatric: Normal judgment and insight. Alert and oriented x 3. Normal mood and appropriate affect.   Data Reviewed: I have personally reviewed following labs and imaging studies  CBC: Recent Labs  Lab 10/17/18 2148 10/18/18 0126 10/19/18 0829  WBC 8.6 7.0 7.7  NEUTROABS  --   --  5.5  HGB 12.7* 12.0* 11.9*  HCT 38.3* 35.7* 35.8*  MCV 92.3 90.2 91.8  PLT 173 170 938   Basic Metabolic Panel: Recent Labs  Lab 10/17/18 2148 10/18/18 0126 10/19/18 0829  NA 137 136 136  K 4.2 4.0 4.2  CL 105 105 104  CO2 22 24 24   GLUCOSE 118* 116* 146*  BUN 16 14 14   CREATININE 1.47* 1.39* 1.50*  CALCIUM 8.9 8.8* 8.9  MG  --   --  1.9  PHOS  --   --  3.9   GFR: Estimated Creatinine Clearance: 49.8 mL/min (A) (by C-G formula based on SCr of 1.5 mg/dL (H)). Liver Function Tests: Recent Labs  Lab 10/19/18 0829  AST 17  ALT 18  ALKPHOS 44  BILITOT 1.4*  PROT 6.9  ALBUMIN 3.7   No results for input(s):  LIPASE, AMYLASE in the last 168 hours. No results for input(s): AMMONIA in the last 168 hours. Coagulation Profile: No results for input(s): INR, PROTIME in the last 168 hours. Cardiac Enzymes: No results for input(s): CKTOTAL, CKMB, CKMBINDEX, TROPONINI in the last 168 hours. BNP (last 3 results) No results for input(s): PROBNP in the last 8760 hours. HbA1C: No results for input(s): HGBA1C in the last 72 hours. CBG: Recent Labs  Lab 10/18/18 2257 10/19/18 0406 10/19/18 0812 10/19/18 1152 10/19/18 1642  GLUCAP 162* 117* 111* 121* 136*   Lipid Profile: No results for input(s): CHOL, HDL, LDLCALC, TRIG, CHOLHDL, LDLDIRECT  in the last 72 hours. Thyroid Function Tests: No results for input(s): TSH, T4TOTAL, FREET4, T3FREE, THYROIDAB in the last 72 hours. Anemia Panel: No results for input(s): VITAMINB12, FOLATE, FERRITIN, TIBC, IRON, RETICCTPCT in the last 72 hours. Sepsis Labs: No results for input(s): PROCALCITON, LATICACIDVEN in the last 168 hours.  Recent Results (from the past 240 hour(s))  SARS Coronavirus 2 (CEPHEID - Performed in [place] hospital lab), Hosp Order     Status: None   Collection Time: 10/17/18 11:05 PM   Specimen: Nasopharyngeal Swab  Result Value Ref Range Status   SARS Coronavirus 2 NEGATIVE NEGATIVE Final    Comment: (NOTE) If result is NEGATIVE SARS-CoV-2 target nucleic acids are NOT DETECTED. The SARS-CoV-2 RNA is generally detectable in upper and lower  respiratory specimens during the acute phase of infection. The lowest  concentration of SARS-CoV-2 viral copies this assay can detect is 250  copies / mL. A negative result does not preclude SARS-CoV-2 infection  and should not be used as the sole basis for treatment or other  patient management decisions.  A negative result may occur with  improper specimen collection / handling, submission of specimen other  than nasopharyngeal swab, presence of viral mutation(s) within the  areas targeted by this assay, and inadequate number of viral copies  (<250 copies / mL). A negative result must be combined with clinical  observations, patient history, and epidemiological information. If result is POSITIVE SARS-CoV-2 target nucleic acids are DETECTED. The SARS-CoV-2 RNA is generally detectable in upper and lower  respiratory specimens dur ing the acute phase of infection.  Positive  results are indicative of active infection with SARS-CoV-2.  Clinical  correlation with patient history and other diagnostic information is  necessary to determine patient infection status.  Positive results do  not rule out bacterial infection or  co-infection with other viruses. If result is PRESUMPTIVE POSTIVE SARS-CoV-2 nucleic acids MAY BE PRESENT.   A presumptive positive result was obtained on the submitted specimen  and confirmed on repeat testing.  While 2019 novel coronavirus  (SARS-CoV-2) nucleic acids may be present in the submitted sample  additional confirmatory testing may be necessary for epidemiological  and / or clinical management purposes  to differentiate between  SARS-CoV-2 and other Sarbecovirus currently known to infect humans.  If clinically indicated additional testing with an alternate test  methodology [phone]) is advised. The SARS-CoV-2 RNA is generally  detectable in upper and lower respiratory sp ecimens during the acute  phase of infection. The expected result is Negative. Fact Sheet for Patients:  StrictlyIdeas.no Fact Sheet for Healthcare Providers: BankingDealers.co.za This test is not yet approved or cleared by the [place] FDA and has been authorized for detection and/or diagnosis of SARS-CoV-2 by FDA under an Emergency Use Authorization (EUA).  This EUA will remain in effect (meaning this test can be used) for  the duration of the COVID-19 declaration under Section 564(b)(1) of the Act, 21 U.S.C. section 360bbb-3(b)(1), unless the authorization is terminated or revoked sooner. Performed at [facility], [place] [address], [place], [place] [address]   MRSA PCR Screening     Status: None   Collection Time: 10/18/18 12:36 AM   Specimen: Nasopharyngeal  Result Value Ref Range Status   MRSA by PCR NEGATIVE NEGATIVE Final    Comment:        The GeneXpert MRSA Assay (FDA approved for NASAL specimens only), is one component of a comprehensive MRSA colonization surveillance program. It is not intended to diagnose MRSA infection nor to guide or monitor treatment for MRSA infections. Performed at [facility], [place] [address]  [place],  [address]     Radiology Studies: Dg Chest Port 1 View  Result Date: 10/17/2018 CLINICAL DATA:  Preop for knee surgery EXAM: PORTABLE CHEST 1 VIEW COMPARISON:  November 30, 2014 FINDINGS: There is mild enlargement of the cardiac silhouette. A left-sided pacemaker seen with the lead tip in the right ventricle. The lungs are clear. No large airspace consolidation. No acute osseous findings. Bilateral shoulder arthropathy is seen. IMPRESSION: No acute cardiopulmonary process. Electronically Signed   By: Prudencio Pair M.D.   On: 10/17/2018 23:56   Dg Knee Complete 4 Views Right  Result Date: 10/17/2018 CLINICAL DATA:  Right knee pain after a fall today. EXAM: RIGHT KNEE - COMPLETE 4+ VIEW COMPARISON:  None. FINDINGS: No evidence of fracture, dislocation, or joint effusion. No evidence of arthropathy or other focal bone abnormality. Soft tissues are unremarkable. IMPRESSION: Negative. Electronically Signed   By: Logan Bores M.D.   On: 10/17/2018 21:01   Scheduled Meds:  insulin aspart  0-9 Units Subcutaneous Q4H   levETIRAcetam  1,000 mg Oral QPM   levETIRAcetam  500 mg Oral q morning - 10a   losartan  100 mg Oral Daily   metoprolol tartrate  12.5 mg Oral BID   mometasone-formoterol  2 puff Inhalation BID   NIFEdipine  30 mg Oral Daily   polyethylene glycol  17 g Oral BID   senna-docusate  1 tablet Oral BID   simvastatin  10 mg Oral QHS   tamsulosin  0.4 mg Oral Daily   Continuous Infusions:  methocarbamol (ROBAXIN) IV      LOS: 2 days   Kerney Elbe, DO [facility] PAGER is on AMION  If 7PM-7AM, please contact night-coverage [email or web address] Password [email or web address] 10/19/2018, 5:49 PM

## 2018-10-20 ENCOUNTER — Inpatient Hospital Stay (HOSPITAL_COMMUNITY): Payer: Medicare Other | Admitting: Critical Care Medicine

## 2018-10-20 ENCOUNTER — Encounter (HOSPITAL_COMMUNITY)
Admission: EM | Disposition: A | Discharge status: Home Health | Payer: Self-pay | Source: Home / Self Care | Attending: Internal Medicine

## 2018-10-20 ENCOUNTER — Encounter (HOSPITAL_COMMUNITY): Payer: Self-pay | Admitting: Critical Care Medicine

## 2018-10-20 HISTORY — PX: QUADRICEPS TENDON REPAIR: SHX756

## 2018-10-20 LAB — COMPREHENSIVE METABOLIC PANEL
ALT: 17 U/L (ref 0–44)
AST: 15 U/L (ref 15–41)
Albumin: 3.4 g/dL — ABNORMAL LOW (ref 3.5–5.0)
Alkaline Phosphatase: 36 U/L — ABNORMAL LOW (ref 38–126)
Anion gap: 9 (ref 5–15)
BUN: 16 mg/dL (ref 8–23)
CO2: 26 mmol/L (ref 22–32)
Calcium: 8.8 mg/dL — ABNORMAL LOW (ref 8.9–10.3)
Chloride: 102 mmol/L (ref 98–111)
Creatinine, Ser: 1.52 mg/dL — ABNORMAL HIGH (ref 0.61–1.24)
GFR calc Af Amer: 53 mL/min — ABNORMAL LOW (ref >60)
GFR calc non Af Amer: 46 mL/min — ABNORMAL LOW (ref >60)
Glucose, Bld: 131 mg/dL — ABNORMAL HIGH (ref 70–99)
Potassium: 4.5 mmol/L (ref 3.5–5.1)
Sodium: 137 mmol/L (ref 135–145)
Total Bilirubin: 1 mg/dL (ref 0.3–1.2)
Total Protein: 6.4 g/dL — ABNORMAL LOW (ref 6.5–8.1)

## 2018-10-20 LAB — IRON AND TIBC
Iron: 43 ug/dL — ABNORMAL LOW (ref 45–182)
Saturation Ratios: 19 % (ref 17.9–39.5)
TIBC: 227 ug/dL — ABNORMAL LOW (ref 250–450)
UIBC: 184 ug/dL

## 2018-10-20 LAB — RETICULOCYTES
Immature Retic Fract: 17.9 % — ABNORMAL HIGH (ref 2.3–15.9)
RBC.: 3.66 MIL/uL — ABNORMAL LOW (ref 4.22–5.81)
Retic Count, Absolute: 46.5 10*3/uL (ref 19.0–186.0)
Retic Ct Pct: 1.3 % (ref 0.4–3.1)

## 2018-10-20 LAB — PHOSPHORUS: Phosphorus: 4.3 mg/dL (ref 2.5–4.6)

## 2018-10-20 LAB — CBC WITH DIFFERENTIAL/PLATELET
Abs Immature Granulocytes: 0.04 10*3/uL (ref 0.00–0.07)
Basophils Absolute: 0 10*3/uL (ref 0.0–0.1)
Basophils Relative: 1 %
Eosinophils Absolute: 0.1 10*3/uL (ref 0.0–0.5)
Eosinophils Relative: 1 %
HCT: 34.4 % — ABNORMAL LOW (ref 39.0–52.0)
Hemoglobin: 11.2 g/dL — ABNORMAL LOW (ref 13.0–17.0)
Immature Granulocytes: 1 %
Lymphocytes Relative: 18 %
Lymphs Abs: 1.2 10*3/uL (ref 0.7–4.0)
MCH: 30.6 pg (ref 26.0–34.0)
MCHC: 32.6 g/dL (ref 30.0–36.0)
MCV: 94 fL (ref 80.0–100.0)
Monocytes Absolute: 0.6 10*3/uL (ref 0.1–1.0)
Monocytes Relative: 9 %
Neutro Abs: 4.8 10*3/uL (ref 1.7–7.7)
Neutrophils Relative %: 70 %
Platelets: 153 10*3/uL (ref 150–400)
RBC: 3.66 MIL/uL — ABNORMAL LOW (ref 4.22–5.81)
RDW: 12.4 % (ref 11.5–15.5)
WBC: 6.7 10*3/uL (ref 4.0–10.5)
nRBC: 0 % (ref 0.0–0.2)

## 2018-10-20 LAB — FOLATE: Folate: 32.4 ng/mL (ref >5.9)

## 2018-10-20 LAB — FERRITIN: Ferritin: 181 ng/mL (ref 24–336)

## 2018-10-20 LAB — GLUCOSE, CAPILLARY
Glucose-Capillary: 112 mg/dL — ABNORMAL HIGH (ref 70–99)
Glucose-Capillary: 131 mg/dL — ABNORMAL HIGH (ref 70–99)
Glucose-Capillary: 124 mg/dL — ABNORMAL HIGH (ref 70–99)
Glucose-Capillary: 106 mg/dL — ABNORMAL HIGH (ref 70–99)
Glucose-Capillary: 188 mg/dL — ABNORMAL HIGH (ref 70–99)
Glucose-Capillary: 159 mg/dL — ABNORMAL HIGH (ref 70–99)

## 2018-10-20 LAB — MAGNESIUM: Magnesium: 2 mg/dL (ref 1.7–2.4)

## 2018-10-20 LAB — VITAMIN B12: Vitamin B-12: 400 pg/mL (ref 180–914)

## 2018-10-20 SURGERY — REPAIR, TENDON, QUADRICEPS
Anesthesia: General | Site: Knee | Laterality: Left

## 2018-10-20 MED ORDER — ROPIVACAINE HCL 5 MG/ML IJ SOLN
INTRAMUSCULAR | Status: DC | PRN
  Administered 2018-10-20: 30 mL via PERINEURAL

## 2018-10-20 MED ORDER — ONDANSETRON HCL 4 MG/2ML IJ SOLN: 4.0000 mg | Freq: Four times a day (QID) | INTRAMUSCULAR | Status: DC | PRN

## 2018-10-20 MED ORDER — CEFAZOLIN SODIUM-DEXTROSE 2-4 GM/100ML-% IV SOLN
2.0000 g | Freq: Four times a day (QID) | INTRAVENOUS | Status: AC
  Administered 2018-10-20 – 2018-10-21 (×3): 2 g via INTRAVENOUS
  Filled 2018-10-20 (×3): qty 100

## 2018-10-20 MED ORDER — HYDROCODONE-ACETAMINOPHEN 5-325 MG PO TABS: 1.0000 | ORAL_TABLET | Freq: Four times a day (QID) | ORAL | 0 refills | Status: AC | PRN

## 2018-10-20 MED ORDER — LACTATED RINGERS IV SOLN
INTRAVENOUS | Status: DC
  Administered 2018-10-20: 15:00:00 via INTRAVENOUS

## 2018-10-20 MED ORDER — MIDAZOLAM HCL 2 MG/2ML IJ SOLN
INTRAMUSCULAR | Status: AC
  Filled 2018-10-20: qty 2

## 2018-10-20 MED ORDER — FENTANYL CITRATE (PF) 100 MCG/2ML IJ SOLN: 25.0000 ug | INTRAMUSCULAR | Status: DC | PRN

## 2018-10-20 MED ORDER — CEFAZOLIN SODIUM-DEXTROSE 2-3 GM-%(50ML) IV SOLR
INTRAVENOUS | Status: DC | PRN
  Administered 2018-10-20: 2 g via INTRAVENOUS

## 2018-10-20 MED ORDER — ETOMIDATE 2 MG/ML IV SOLN
INTRAVENOUS | Status: DC | PRN
  Administered 2018-10-20: 20 mg via INTRAVENOUS

## 2018-10-20 MED ORDER — FENTANYL CITRATE (PF) 100 MCG/2ML IJ SOLN
50.0000 ug | Freq: Once | INTRAMUSCULAR | Status: AC
  Administered 2018-10-20: 50 ug via INTRAVENOUS

## 2018-10-20 MED ORDER — DEXAMETHASONE SODIUM PHOSPHATE 10 MG/ML IJ SOLN
INTRAMUSCULAR | Status: DC | PRN
  Administered 2018-10-20: 4 mg via INTRAVENOUS

## 2018-10-20 MED ORDER — METOCLOPRAMIDE HCL 5 MG PO TABS: 5.0000 mg | ORAL_TABLET | Freq: Three times a day (TID) | ORAL | Status: DC | PRN

## 2018-10-20 MED ORDER — BACLOFEN 10 MG PO TABS: 10.0000 mg | ORAL_TABLET | Freq: Two times a day (BID) | ORAL | 0 refills | Status: DC | PRN

## 2018-10-20 MED ORDER — OXYCODONE HCL 5 MG/5ML PO SOLN: 5.0000 mg | Freq: Once | ORAL | Status: DC | PRN

## 2018-10-20 MED ORDER — FENTANYL CITRATE (PF) 250 MCG/5ML IJ SOLN
INTRAMUSCULAR | Status: AC
  Filled 2018-10-20: qty 5

## 2018-10-20 MED ORDER — METOCLOPRAMIDE HCL 5 MG/ML IJ SOLN: 5.0000 mg | Freq: Three times a day (TID) | INTRAMUSCULAR | Status: DC | PRN

## 2018-10-20 MED ORDER — FENTANYL CITRATE (PF) 100 MCG/2ML IJ SOLN
INTRAMUSCULAR | Status: AC
  Administered 2018-10-20: 50 ug via INTRAVENOUS
  Filled 2018-10-20: qty 2

## 2018-10-20 MED ORDER — OXYCODONE HCL 5 MG PO TABS: 5.0000 mg | ORAL_TABLET | Freq: Once | ORAL | Status: DC | PRN

## 2018-10-20 MED ORDER — LACTATED RINGERS IV SOLN
INTRAVENOUS | Status: DC
  Administered 2018-10-20: 10:00:00 via INTRAVENOUS

## 2018-10-20 MED ORDER — SENNA 8.6 MG PO TABS
1.0000 | ORAL_TABLET | Freq: Two times a day (BID) | ORAL | Status: DC
  Administered 2018-10-20 – 2018-10-21 (×2): 8.6 mg via ORAL
  Filled 2018-10-20 (×2): qty 1

## 2018-10-20 MED ORDER — PROPOFOL 10 MG/ML IV BOLUS
INTRAVENOUS | Status: AC
  Filled 2018-10-20: qty 20

## 2018-10-20 MED ORDER — ONDANSETRON HCL 4 MG PO TABS: 4.0000 mg | ORAL_TABLET | Freq: Four times a day (QID) | ORAL | Status: DC | PRN

## 2018-10-20 MED ORDER — MIDAZOLAM HCL 2 MG/2ML IJ SOLN
2.0000 mg | Freq: Once | INTRAMUSCULAR | Status: AC
  Administered 2018-10-20: 2 mg via INTRAVENOUS

## 2018-10-20 MED ORDER — MIDAZOLAM HCL 2 MG/2ML IJ SOLN
INTRAMUSCULAR | Status: AC
  Administered 2018-10-20: 2 mg via INTRAVENOUS
  Filled 2018-10-20: qty 2

## 2018-10-20 MED ORDER — 0.9 % SODIUM CHLORIDE (POUR BTL) OPTIME
TOPICAL | Status: DC | PRN
  Administered 2018-10-20: 1000 mL

## 2018-10-20 MED ORDER — ONDANSETRON HCL 4 MG/2ML IJ SOLN
INTRAMUSCULAR | Status: DC | PRN
  Administered 2018-10-20: 4 mg via INTRAVENOUS

## 2018-10-20 MED ORDER — RIVAROXABAN 20 MG PO TABS
20.0000 mg | ORAL_TABLET | Freq: Every day | ORAL | Status: DC
  Administered 2018-10-21: 20 mg via ORAL
  Filled 2018-10-20 (×2): qty 1

## 2018-10-20 MED ORDER — MORPHINE SULFATE (PF) 2 MG/ML IV SOLN
0.5000 mg | INTRAVENOUS | Status: DC | PRN
  Administered 2018-10-21: 0.5 mg via INTRAVENOUS
  Filled 2018-10-20: qty 1

## 2018-10-20 MED ORDER — PHENYLEPHRINE 40 MCG/ML (10ML) SYRINGE FOR IV PUSH (FOR BLOOD PRESSURE SUPPORT)
PREFILLED_SYRINGE | INTRAVENOUS | Status: DC | PRN
  Administered 2018-10-20 (×5): 40 ug via INTRAVENOUS

## 2018-10-20 SURGICAL SUPPLY — 60 items
BAG DECANTER FOR FLEXI CONT (MISCELLANEOUS) ×2 IMPLANT
BANDAGE ESMARK 6X9 LF (GAUZE/BANDAGES/DRESSINGS) ×1 IMPLANT
BNDG COHESIVE 6X5 TAN STRL LF (GAUZE/BANDAGES/DRESSINGS) ×1 IMPLANT
BNDG ELASTIC 6X5.8 VLCR STR LF (GAUZE/BANDAGES/DRESSINGS) ×2 IMPLANT
BNDG ESMARK 6X9 LF (GAUZE/BANDAGES/DRESSINGS) ×2
BNDG GAUZE ELAST 4 BULKY (GAUZE/BANDAGES/DRESSINGS) ×1 IMPLANT
CANISTER SUCT 3000ML PPV (MISCELLANEOUS) ×2 IMPLANT
CHLORAPREP W/TINT 26 (MISCELLANEOUS) ×2 IMPLANT
CLSR STERI-STRIP ANTIMIC 1/2X4 (GAUZE/BANDAGES/DRESSINGS) ×2 IMPLANT
COVER MAYO STAND STRL (DRAPES) ×1 IMPLANT
COVER SURGICAL LIGHT HANDLE (MISCELLANEOUS) ×1 IMPLANT
COVER WAND RF STERILE (DRAPES) ×2 IMPLANT
CUFF TOURN SGL QUICK 34 (TOURNIQUET CUFF) ×1
CUFF TRNQT CYL 34X4.125X (TOURNIQUET CUFF) ×1 IMPLANT
DECANTER SPIKE VIAL GLASS SM (MISCELLANEOUS) IMPLANT
DRAPE OEC MINIVIEW 54X84 (DRAPES) ×2 IMPLANT
DRAPE U-SHAPE 47X51 STRL (DRAPES) ×2 IMPLANT
DRSG ADAPTIC 3X8 NADH LF (GAUZE/BANDAGES/DRESSINGS) ×1 IMPLANT
ELECT REM PT RETURN 9FT ADLT (ELECTROSURGICAL) ×2
ELECTRODE REM PT RTRN 9FT ADLT (ELECTROSURGICAL) ×1 IMPLANT
GAUZE SPONGE 4X4 12PLY STRL (GAUZE/BANDAGES/DRESSINGS) ×2 IMPLANT
GAUZE SPONGE 4X4 12PLY STRL LF (GAUZE/BANDAGES/DRESSINGS) ×1 IMPLANT
GAUZE XEROFORM 1X8 LF (GAUZE/BANDAGES/DRESSINGS) ×2 IMPLANT
GLOVE BIO SURGEON STRL SZ7.5 (GLOVE) ×4 IMPLANT
GLOVE BIOGEL PI IND STRL 8 (GLOVE) ×2 IMPLANT
GLOVE BIOGEL PI INDICATOR 8 (GLOVE) ×2
GOWN STRL REUS W/ TWL LRG LVL3 (GOWN DISPOSABLE) ×3 IMPLANT
GOWN STRL REUS W/TWL LRG LVL3 (GOWN DISPOSABLE) ×3
IMMOBILIZER KNEE 22 UNIV (SOFTGOODS) ×2 IMPLANT
KIT BASIN OR (CUSTOM PROCEDURE TRAY) ×2 IMPLANT
KIT TURNOVER KIT B (KITS) ×2 IMPLANT
NDL SUT 6 .5 CRC .975X.05 MAYO (NEEDLE) IMPLANT
NEEDLE MAYO TAPER (NEEDLE)
NS IRRIG 1000ML POUR BTL (IV SOLUTION) ×2 IMPLANT
PACK ORTHO EXTREMITY (CUSTOM PROCEDURE TRAY) ×2 IMPLANT
PAD ABD 8X10 STRL (GAUZE/BANDAGES/DRESSINGS) ×1 IMPLANT
PAD ARMBOARD 7.5X6 YLW CONV (MISCELLANEOUS) ×4 IMPLANT
PADDING CAST COTTON 6X4 STRL (CAST SUPPLIES) ×2 IMPLANT
PENCIL BUTTON HOLSTER BLD 10FT (ELECTRODE) ×2 IMPLANT
RETRIEVER SUT HEWSON (MISCELLANEOUS) ×1 IMPLANT
SPONGE LAP 18X18 RF (DISPOSABLE) ×2 IMPLANT
STOCKINETTE IMPERVIOUS LG (DRAPES) ×1 IMPLANT
SUCTION FRAZIER HANDLE 10FR (MISCELLANEOUS)
SUCTION TUBE FRAZIER 10FR DISP (MISCELLANEOUS) IMPLANT
SUT ETHILON 3 0 PS 1 (SUTURE) IMPLANT
SUT FIBERWIRE #2 38 T-5 BLUE (SUTURE) ×4
SUT MNCRL AB 4-0 PS2 18 (SUTURE) ×1 IMPLANT
SUT VIC AB 0 CT1 27 (SUTURE) ×2
SUT VIC AB 0 CT1 27XBRD ANBCTR (SUTURE) IMPLANT
SUT VIC AB 1 CT1 27 (SUTURE) ×2
SUT VIC AB 1 CT1 27XBRD ANBCTR (SUTURE) IMPLANT
SUT VIC AB 2-0 CT1 27 (SUTURE) ×1
SUT VIC AB 2-0 CT1 TAPERPNT 27 (SUTURE) IMPLANT
SUT VIC AB 2-0 SH 27 (SUTURE)
SUT VIC AB 2-0 SH 27XBRD (SUTURE) IMPLANT
SUT VIC AB 3-0 SH 27 (SUTURE)
SUT VIC AB 3-0 SH 27X BRD (SUTURE) IMPLANT
SUTURE FIBERWR #2 38 T-5 BLUE (SUTURE) IMPLANT
TUBE CONNECTING 20X1/4 (TUBING) ×2 IMPLANT
YANKAUER SUCT BULB TIP NO VENT (SUCTIONS) ×1 IMPLANT

## 2018-10-20 NOTE — Op Note (Signed)
10/17/2018 - 10/20/2018  12:10 PM  PATIENT:  Brad Brown    PRE-OPERATIVE DIAGNOSIS:  left quad tendon tear  POST-OPERATIVE DIAGNOSIS:  Same  PROCEDURE:  REPAIR QUADRICEP TENDON  SURGEON:  Renette Butters, MD  ASSISTANT: Roxan Hockey, PA-C, he was present and scrubbed throughout the case, critical for completion in a timely fashion, and for retraction, instrumentation, and closure.   ANESTHESIA:   gen  PREOPERATIVE INDICATIONS:  Brad Brown is a  73 y.o. male with a diagnosis of left quad tendon tear who elected for surgical management.    The risks benefits and alternatives were discussed with the patient preoperatively including but not limited to the risks of infection, bleeding, nerve injury, cardiopulmonary complications, the need for revision surgery, among others, and the patient was willing to proceed.  OPERATIVE FINDINGS: complete rupture  BLOOD LOSS: min  TOURNIQUET TIME: 19min  OPERATIVE PROCEDURE:  Patient was identified in the preoperative holding area and site was marked by me He was transported to the operating theater and placed on the table in supine position taking care to pad all bony prominences. After a preincinduction time out anesthesia was induced. The left lower extremity was prepped and draped in normal sterile fashion and a pre-incision timeout was performed. He received ancef for preoperative antibiotics.   I made an incision directly over his traumatic injury. I dissected down to the level of the peritenon and elevated skin flaps over top of this there were full-thickness.  I then incised the peritenon it was partially ruptured as well. Identified his rupture tendon.  I debrided tendon from the patella allowing a good bony bed for healing.  I then used 2 #2 FiberWire as a whipstitched up and back in the Tendon leaving me with 4 total strands coming out.  I thoroughly irrigated the joint  Next I used a drill bit to drill 3 holes in  the patella taking care to not penetrate the articular surface. I used a [place] suture passer to pass all 4 stitches through these holes.  The patella reapproximated well to the tendon. I tied the stitches over top of the bone bridge of the patella.  I then stressed the repair and it was stable to 70 degrees.  I then thoroughly irrigated the wound again.  I repaired the medial collateral capsul  I then repaired the lateral collateral capsul  I closed the ruptured peritenon as well as the surgically incised peritenon with an 0 Vicryl. Then closed the skin with a monocryl stitch  Sterile dressing was applied the knee was placed in a knee immobilizer and taken the PACU in stable condition.  POST OPERATIVE PLAN: WBAT in immobilizer, DVT px: early ambulation and chemical px

## 2018-10-20 NOTE — Discharge Instructions (Signed)
° °  Left Quad Tendon:  Maintain dressings until follow up in the office with Dr. Edmonia Lynch.  Elevate leg at all times when not walking to reduce swelling and pain.  You may bear weight as tolerated in knee immobilizer.  Keep knee straight at all times.  Do not bend your knee.  Immobilizer must be in place when walking.    Information on my medicine - XARELTO (Rivaroxaban)  Why was Xarelto prescribed for you? Xarelto was prescribed for you to reduce the risk of a blood clot forming that can cause a stroke if you have a medical condition called atrial fibrillation (a type of irregular heartbeat).  What do you need to know about xarelto ? Take your Xarelto ONCE DAILY at the same time every day with your evening meal. If you have difficulty swallowing the tablet whole, you may crush it and mix in applesauce just prior to taking your dose.  Take Xarelto exactly as prescribed by your doctor and DO NOT stop taking Xarelto without talking to the doctor who prescribed the medication.  Stopping without other stroke prevention medication to take the place of Xarelto may increase your risk of developing a clot that causes a stroke.  Refill your prescription before you run out.  After discharge, you should have regular check-up appointments with your healthcare provider that is prescribing your Xarelto.  In the future your dose may need to be changed if your kidney function or weight changes by a significant amount.  What do you do if you miss a dose? If you are taking Xarelto ONCE DAILY and you miss a dose, take it as soon as you remember on the same day then continue your regularly scheduled once daily regimen the next day. Do not take two doses of Xarelto at the same time or on the same day.   Important Safety Information A possible side effect of Xarelto is bleeding. You should call your healthcare provider right away if you experience any of the following: ? Bleeding from an  injury or your nose that does not stop. ? Unusual colored urine (red or dark brown) or unusual colored stools (red or black). ? Unusual bruising for unknown reasons. ? A serious fall or if you hit your head (even if there is no bleeding).  Some medicines may interact with Xarelto and might increase your risk of bleeding while on Xarelto. To help avoid this, consult your healthcare provider or pharmacist prior to using any new prescription or non-prescription medications, including herbals, vitamins, non-steroidal anti-inflammatory drugs (NSAIDs) and supplements.  This website has more information on Xarelto: [email or web address]-benson.[email or web address]

## 2018-10-20 NOTE — Anesthesia Procedure Notes (Signed)
Anesthesia Regional Block: Femoral nerve block   Pre-Anesthetic Checklist: ,, timeout performed, Correct Patient, Correct Site, Correct Laterality, Correct Procedure,, site marked, risks and benefits discussed, Surgical consent,  Pre-op evaluation,  At surgeon's request and post-op pain management  Laterality: Left  Prep: chloraprep       Needles:  Injection technique: Single-shot  Needle Type: Echogenic Stimulator Needle     Needle Length: 9cm  Needle Gauge: 21     Additional Needles:   Procedures:, nerve stimulator,,,, intact distal pulses,,,   Nerve Stimulator or Paresthesia:  Response: Quadriceps muscle contraction, 0.45 mA,   Additional Responses:   Narrative:  Start time: 10/20/2018 10:52 AM End time: 10/20/2018 10:58 AM Injection made incrementally with aspirations every 5 mL.  Performed by: Personally  Anesthesiologist: Albertha Ghee, MD  Additional Notes: Functioning IV was confirmed and monitors were applied.  A 69mm 21ga Arrow echogenic stimulator needle was used. Sterile prep and drape,hand hygiene and sterile gloves were used.  Negative aspiration and negative test dose prior to incremental administration of local anesthetic. The patient tolerated the procedure well.

## 2018-10-20 NOTE — Anesthesia Procedure Notes (Signed)
Procedure Name: LMA Insertion Date/Time: 10/20/2018 11:37 AM Performed by: Wilburn Cornelia, CRNA Pre-anesthesia Checklist: Patient identified, Emergency Drugs available, Suction available, Patient being monitored and Timeout performed Patient Re-evaluated:Patient Re-evaluated prior to induction Oxygen Delivery Method: Circle system utilized Preoxygenation: Pre-oxygenation with 100% oxygen Induction Type: IV induction Ventilation: Mask ventilation without difficulty LMA: LMA inserted LMA Size: 5.0 Number of attempts: 1 Placement Confirmation: positive ETCO2 and breath sounds checked- equal and bilateral Tube secured with: Tape Dental Injury: Teeth and Oropharynx as per pre-operative assessment

## 2018-10-20 NOTE — Anesthesia Preprocedure Evaluation (Signed)
Anesthesia Evaluation  Patient identified by MRN, date of birth, ID band Patient awake    Reviewed: Allergy & Precautions, H&P , NPO status , Patient's Chart, lab work & pertinent test results  Airway Mallampati: II   Neck ROM: full    Dental   Pulmonary COPD, former smoker,    breath sounds clear to auscultation       Cardiovascular hypertension, + CAD and + Past MI  + Cardiac Defibrillator  Rhythm:regular Rate:Normal  Stress test (2008): no ischemia, EF 59%   Neuro/Psych Seizures -,  CVA    GI/Hepatic GERD  ,  Endo/Other  diabetes, Type 2  Renal/GU Renal InsufficiencyRenal disease     Musculoskeletal  (+) Arthritis ,   Abdominal   Peds  Hematology   Anesthesia Other Findings   Reproductive/Obstetrics                             Anesthesia Physical Anesthesia Plan  ASA: IV  Anesthesia Plan: General   Post-op Pain Management:  Regional for Post-op pain   Induction: Intravenous  PONV Risk Score and Plan: 2 and Ondansetron, Dexamethasone and Treatment may vary due to age or medical condition  Airway Management Planned: LMA  Additional Equipment:   Intra-op Plan:   Post-operative Plan:   Informed Consent: I have reviewed the patients History and Physical, chart, labs and discussed the procedure including the risks, benefits and alternatives for the proposed anesthesia with the patient or authorized representative who has indicated his/her understanding and acceptance.       Plan Discussed with: CRNA, Anesthesiologist and Surgeon  Anesthesia Plan Comments:         Anesthesia Quick Evaluation

## 2018-10-20 NOTE — Progress Notes (Addendum)
    Subjective: This patient underwent successful Left quad tendon repair today.  Objective:   VITALS:   Vitals:   10/20/18 1018 10/20/18 1120 10/20/18 1125 10/20/18 1245  BP:  (!) 155/76 133/65   Pulse:  74 77   Resp:  16 10   Temp:    (!) (P) 97.2 F (36.2 C)  TempSrc:      SpO2:  100% 99%   Weight: 87.5 kg     Height: 5\' 9"  (1.753 m)      CBC Latest Ref Rng & Units 10/20/2018 10/19/2018 10/18/2018  WBC 4.0 - 10.5 K/uL 6.7 7.7 7.0  Hemoglobin 13.0 - 17.0 g/dL 11.2(L) 11.9(L) 12.0(L)  Hematocrit 39.0 - 52.0 % 34.4(L) 35.8(L) 35.7(L)  Platelets 150 - 400 K/uL 153 167 170   BMP Latest Ref Rng & Units 10/20/2018 10/19/2018 10/18/2018  Glucose 70 - 99 mg/dL 131(H) 146(H) 116(H)  BUN 8 - 23 mg/dL 16 14 14   Creatinine 0.61 - 1.24 mg/dL 1.52(H) 1.50(H) 1.39(H)  Sodium 135 - 145 mmol/L 137 136 136  Potassium 3.5 - 5.1 mmol/L 4.5 4.2 4.0  Chloride 98 - 111 mmol/L 102 104 105  CO2 22 - 32 mmol/L 26 24 24   Calcium 8.9 - 10.3 mg/dL 8.8(L) 8.9 8.8(L)   Intake/Output      07/29 0701 - 07/30 0700 07/30 0701 - 07/31 0700   P.O. 720 0   I.V. (mL/kg)  600 (6.9)   Total Intake(mL/kg) 720 (8.4) 600 (6.9)   Urine (mL/kg/hr) 2400 (1.2) 300 (0.6)   Blood  25   Total Output 2400 325   Net -1680 +275           Assessment / Plan: Day of Surgery  S/P Procedure(s) (LRB): REPAIR QUADRICEP TENDON (Left) by Dr. Ernesta Amble. Percell Miller on 10/20/2018  Active Problems:   HLD (hyperlipidemia)   Seizures (HCC)   Type 2 diabetes mellitus with stage 1 chronic kidney disease, without long-term current use of insulin (HCC)   Automatic implantable cardioverter-defibrillator in situ   Hypertension   Brugada syndrome   MGUS (monoclonal gammopathy of unknown significance)   Atrial fibrillation (HCC)   GERD (gastroesophageal reflux disease)   BPH (benign prostatic hyperplasia)   Fall at home, initial encounter   Acute traumatic internal derangement of knee, initial encounter   Acute traumatic internal  derangement of left knee   Left quad tendon rupture, status post repair Mobilize with therapy Incentive Spirometry Elevate as much as possible to reduce pain and swelling Apply ice PRN  Weightbearing: WBAT LLE with leg in full extension in knee immobilizer at all times. No knee ROM. Insicional and dressing care: Dressings left intact until follow-up Showering: Keep dressing dry VTE prophylaxis: Okay to resume Xarelto POD1, SCDs, ambulation Contact information:  Edmonia Lynch MD, Roxan Hockey PA-C  Dispo:  Discharge when mobilized and ready medically.   Rx for pain and muscle spasm sent to patient's pharmacy.   Follow up in the office with Dr. Alain Marion in 10 days.  Please call with questions.    Brad Elizabeth Martensen III, PA-C 10/20/2018, 1:01 PM

## 2018-10-20 NOTE — Interval H&P Note (Signed)
I participated in the care of this patient and agree with the above history, physical and evaluation. I performed a review of the history and a physical exam as detailed   Alyiah Ulloa Daniel Mikaylah Libbey MD  

## 2018-10-20 NOTE — Transfer of Care (Signed)
Immediate Anesthesia Transfer of Care Note  Patient: FRITZ CAUTHON  Procedure(s) Performed: REPAIR QUADRICEP TENDON (Left Knee)  Patient Location: PACU  Anesthesia Type:GA combined with regional for post-op pain  Level of Consciousness: awake and alert   Airway & Oxygen Therapy: Patient Spontanous Breathing and Patient connected to nasal cannula oxygen  Post-op Assessment: Report given to RN and Post -op Vital signs reviewed and stable  Post vital signs: Reviewed and stable  Last Vitals:  Vitals Value Taken Time  BP 157/84 10/20/18 1242  Temp    Pulse 73 10/20/18 1242  Resp 9 10/20/18 1242  SpO2 99 % 10/20/18 1242  Vitals shown include unvalidated device data.  Last Pain:  Vitals:   10/20/18 1125  TempSrc:   PainSc: 0-No pain         Complications: No apparent anesthesia complications

## 2018-10-20 NOTE — Progress Notes (Signed)
Patient returned from the [facility] OR. Patient is alert and oriented. Denies pain. Will continue to monitor. Dorthey Sawyer, RN

## 2018-10-20 NOTE — Progress Notes (Signed)
PROGRESS NOTE    Brad Brown  YQI:[id number] DOB: 10-Sep-1945 DOA: 10/17/2018 PCP: Jearld Fenton, NP   Brief Narrative:  The patient is a 73 year old male with a past medical history significant for but not limited to atrial fibrillation, history of Brugada syndrome status post ICD insertion, diabetes mellitus, CAD, COPD, CKD, GERD, hypertension, history of mild cardiomyopathy, hyperlipidemia, seizure disorder, and history of CVA as well as other comorbidities who presents with left knee pain and swelling after falling downhill mowing his lawn and landing on his left leg.  He was admitted for a left quadriceps tendon rupture with hemarthrosis and Cardiology was consulted for further evaluation recommendations for preoperative assessment and it was felt that he was an acceptable risk to proceed with surgery without further cardiac work-up.  Dr. Angelena Form recommending holding Xarelto for surgery with plans to restart as soon as he is cleared by Ortho.  Orthopedic surgery evaluated and recommending repair today (Thursday) by Dr. Percell Miller.  Assessment & Plan:   Active Problems:   HLD (hyperlipidemia)   Seizures (HCC)   Type 2 diabetes mellitus with stage 1 chronic kidney disease, without long-term current use of insulin (HCC)   Automatic implantable cardioverter-defibrillator in situ   Hypertension   Brugada syndrome   MGUS (monoclonal gammopathy of unknown significance)   Atrial fibrillation (HCC)   GERD (gastroesophageal reflux disease)   BPH (benign prostatic hyperplasia)   Fall at home, initial encounter   Acute traumatic internal derangement of knee, initial encounter   Acute traumatic internal derangement of left knee   AcuteTraumatic Internal Derangement of knee. Hematoma. Tendon rupture -Continue Pain control with Hydrocodone-Acetaminophen 1-2 po q6hprn Moderate Pain and with methocarbamol 5 mg p.o./IV every 6 as needed -Holding anticoagulation. Resume when ok with Orthopedics   -Management as per orthopedics, plan to operate today;  -Cardiology consulted for further pre-op clearance given extensive cardiac disease and he is an acceptable risk for Surgical Intervention   Type 2 Diabetes Mellitus -Continue to Monitor on Sensitive Novolog SSI AC -CBG's ranging from 112-161; Blood Sugar this AM on CMP was 131 -Last HbA1c was 6.1; May be beneficial to repeat   A Fib. s/p PPM -Hx of Brugada syndrome.  -Home regimen includes Anticoagulation with Xarelto andAppears to be on BB+nifedipine.  -No acute cardiopulmonary symptoms. -Holding xarelto due to bleed and pending Surgical Intervention -Cardiology recommending Resuming as soon as cleared by Orthopedic Surgery  Hypertension -BP is stable at 124/72. -Continue home medications with Losartan 100 mg po daily, BB with Metoprolol Tartrate 12.5 mg po BID, Nifedipine 30 mg po Daily -Continue to Monitor    HLD -C/w Simvastatin 10 mg po Daily   MGUS(monoclonal gammopathy of unknown significance)  -Chronic and currently stable  BPH -Stable  -Continue home medications of Tamsulosin 0.4 mg po Daily     History of Seizure Disorder -Stable -C/w Home Keppra of 1000 mg po qEvening and 500 mg po Morning   Constipation -Bowel regimen was started with senna docusate 1 tab p.o. twice daily, MiraLAX 17 g p.o. twice daily, as well as bisacodyl 10 mg rectal suppository for moderate constipation -Still has not had a Bowel Movement -If necessary will order an enema  CKD stage III -Patient's BUN/creatinine appears similar to priors and was 14/1.50 yesterday and today was 16/1.52 -Continue monitor trend-avoid nephrotoxic medications if possible but we will continue Losartan at this time. -Avoid contrast dyes, and hypotension -Continue monitor and trend renal function -Repeat CMP in a.m.  Hyperbilirubinemia, improving  -  Mild at 1.4 and improved to 1.0 -Continue to monitor and trend and this is likely reactive -Repeat  CMP in a.m.  Normocytic Anemia/Anemia of Chronic Kidney Disease -Hemoglobin/hematocrit remains relatively stable and went from 12.7/30.3 -> 11.9/35.8 -> 11.2/34.4 -Continue monitor for signs and symptoms of bleeding; currently no overt bleeding noted -Check Anemia Panel this AM and showed a level of 43, U IBC of 184, TIBC of 227, saturation ratios of 19%, ferritin level 181, folate level 32.4, and vitamin B12 level 400 -Repeat CBC in AM   DVT prophylaxis: SCDs; currently anticoagulation is being held Code Status: FULL CODE  Family Communication: No family present at bedside  Disposition Plan: Pending further surgical intervention and evaluation  Consultants:   Cardiology  Orthopedic Surgery    Procedures: None   Antimicrobials:  Anti-infectives (From admission, onward)   None     Subjective: Seen and examined prior to him going down for surgery and he had no complaints.  Denied having any bowel movements yet.  No nausea or vomiting.  States his pain was fairly controlled with the pain medication.  No other concerns or complaints at this time.  States that he had a good night  Objective: Vitals:   10/19/18 2055 10/19/18 2139 10/20/18 0411 10/20/18 0759  BP: (!) 110/55 119/71 127/68 124/72  Pulse: (!) 122 92 82 84  Resp: 16  15 16   Temp: 98.3 F (36.8 C)  98.7 F (37.1 C) 98.5 F (36.9 C)  TempSrc: Oral  Oral Oral  SpO2: 99%  97% 94%  Weight:      Height:        Intake/Output Summary (Last 24 hours) at 10/20/2018 3154 Last data filed at 10/20/2018 0131 Gross per 24 hour  Intake 720 ml  Output 2400 ml  Net -1680 ml   Filed Weights   10/17/18 1357  Weight: 86.2 kg   Examination: Physical Exam:  Constitutional: Well-nourished, well-developed African-American male currently no acute distress appears calm prior to going to his surgery Eyes: Lids and conjunctive are normal.  Sclera anicteric ENMT: External ears nose appear normal.  Grossly normal hearing Neck:  Appears supple no JVD Respiratory: Mildly diminished auscultation bilaterally with no appreciable wheezing, rales, rhonchi.  Patient not tachypneic wheezing accessory muscles breathe Cardiovascular: Irregularly irregular.  No appreciable murmurs, rubs, gallops noted.  Abdomen: Soft, nontender, mildly distended.  Bowel sounds present GU: Deferred Musculoskeletal: No clubbing or cyanosis noted.  Left leg is immobilized Skin: No appreciable rashes or lesions on to skin evaluation Neurologic: Cranial nerves II through XII grossly intact no visual focal deficit. Psychiatric: Pleasant mood and affect.  Intact judgment and insight.  Patient is awake, alert, oriented x3  Data Reviewed: I have personally reviewed following labs and imaging studies  CBC: Recent Labs  Lab 10/17/18 2148 10/18/18 0126 10/19/18 0829 10/20/18 0302  WBC 8.6 7.0 7.7 6.7  NEUTROABS  --   --  5.5 4.8  HGB 12.7* 12.0* 11.9* 11.2*  HCT 38.3* 35.7* 35.8* 34.4*  MCV 92.3 90.2 91.8 94.0  PLT 173 170 167 008   Basic Metabolic Panel: Recent Labs  Lab 10/17/18 2148 10/18/18 0126 10/19/18 0829 10/20/18 0302  NA 137 136 136 137  K 4.2 4.0 4.2 4.5  CL 105 105 104 102  CO2 22 24 24 26   GLUCOSE 118* 116* 146* 131*  BUN 16 14 14 16   CREATININE 1.47* 1.39* 1.50* 1.52*  CALCIUM 8.9 8.8* 8.9 8.8*  MG  --   --  1.9 2.0  PHOS  --   --  3.9 4.3   GFR: Estimated Creatinine Clearance: 49.2 mL/min (A) (by C-G formula based on SCr of 1.52 mg/dL (H)). Liver Function Tests: Recent Labs  Lab 10/19/18 0829 10/20/18 0302  AST 17 15  ALT 18 17  ALKPHOS 44 36*  BILITOT 1.4* 1.0  PROT 6.9 6.4*  ALBUMIN 3.7 3.4*   No results for input(s): LIPASE, AMYLASE in the last 168 hours. No results for input(s): AMMONIA in the last 168 hours. Coagulation Profile: No results for input(s): INR, PROTIME in the last 168 hours. Cardiac Enzymes: No results for input(s): CKTOTAL, CKMB, CKMBINDEX, TROPONINI in the last 168 hours. BNP (last  3 results) No results for input(s): PROBNP in the last 8760 hours. HbA1C: No results for input(s): HGBA1C in the last 72 hours. CBG: Recent Labs  Lab 10/19/18 1152 10/19/18 1642 10/19/18 1948 10/20/18 0106 10/20/18 0408  GLUCAP 121* 136* 161* 112* 131*   Lipid Profile: No results for input(s): CHOL, HDL, LDLCALC, TRIG, CHOLHDL, LDLDIRECT in the last 72 hours. Thyroid Function Tests: No results for input(s): TSH, T4TOTAL, FREET4, T3FREE, THYROIDAB in the last 72 hours. Anemia Panel: No results for input(s): VITAMINB12, FOLATE, FERRITIN, TIBC, IRON, RETICCTPCT in the last 72 hours. Sepsis Labs: No results for input(s): PROCALCITON, LATICACIDVEN in the last 168 hours.  Recent Results (from the past 240 hour(s))  SARS Coronavirus 2 (CEPHEID - Performed in [place] hospital lab), Hosp Order     Status: None   Collection Time: 10/17/18 11:05 PM   Specimen: Nasopharyngeal Swab  Result Value Ref Range Status   SARS Coronavirus 2 NEGATIVE NEGATIVE Final    Comment: (NOTE) If result is NEGATIVE SARS-CoV-2 target nucleic acids are NOT DETECTED. The SARS-CoV-2 RNA is generally detectable in upper and lower  respiratory specimens during the acute phase of infection. The lowest  concentration of SARS-CoV-2 viral copies this assay can detect is 250  copies / mL. A negative result does not preclude SARS-CoV-2 infection  and should not be used as the sole basis for treatment or other  patient management decisions.  A negative result may occur with  improper specimen collection / handling, submission of specimen other  than nasopharyngeal swab, presence of viral mutation(s) within the  areas targeted by this assay, and inadequate number of viral copies  (<250 copies / mL). A negative result must be combined with clinical  observations, patient history, and epidemiological information. If result is POSITIVE SARS-CoV-2 target nucleic acids are DETECTED. The SARS-CoV-2 RNA is generally  detectable in upper and lower  respiratory specimens dur ing the acute phase of infection.  Positive  results are indicative of active infection with SARS-CoV-2.  Clinical  correlation with patient history and other diagnostic information is  necessary to determine patient infection status.  Positive results do  not rule out bacterial infection or co-infection with other viruses. If result is PRESUMPTIVE POSTIVE SARS-CoV-2 nucleic acids MAY BE PRESENT.   A presumptive positive result was obtained on the submitted specimen  and confirmed on repeat testing.  While 2019 novel coronavirus  (SARS-CoV-2) nucleic acids may be present in the submitted sample  additional confirmatory testing may be necessary for epidemiological  and / or clinical management purposes  to differentiate between  SARS-CoV-2 and other Sarbecovirus currently known to infect humans.  If clinically indicated additional testing with an alternate test  methodology [phone]) is advised. The SARS-CoV-2 RNA is generally  detectable in upper and lower  respiratory sp ecimens during the acute  phase of infection. The expected result is Negative. Fact Sheet for Patients:  StrictlyIdeas.no Fact Sheet for Healthcare Providers: BankingDealers.co.za This test is not yet approved or cleared by the [place] FDA and has been authorized for detection and/or diagnosis of SARS-CoV-2 by FDA under an Emergency Use Authorization (EUA).  This EUA will remain in effect (meaning this test can be used) for the duration of the COVID-19 declaration under Section 564(b)(1) of the Act, 21 U.S.C. section 360bbb-3(b)(1), unless the authorization is terminated or revoked sooner. Performed at [facility], [place] [address] [place], [place] [address]   MRSA PCR Screening     Status: None   Collection Time: 10/18/18 12:36 AM   Specimen: Nasopharyngeal  Result Value Ref Range Status   MRSA by  PCR NEGATIVE NEGATIVE Final    Comment:        The GeneXpert MRSA Assay (FDA approved for NASAL specimens only), is one component of a comprehensive MRSA colonization surveillance program. It is not intended to diagnose MRSA infection nor to guide or monitor treatment for MRSA infections. Performed at [facility], [place] [address], [place], [place] [address]     Radiology Studies: No results found. Scheduled Meds: . insulin aspart  0-9 Units Subcutaneous Q4H  . levETIRAcetam  1,000 mg Oral QPM  . levETIRAcetam  500 mg Oral q morning - 10a  . losartan  100 mg Oral Daily  . metoprolol tartrate  12.5 mg Oral BID  . mometasone-formoterol  2 puff Inhalation BID  . NIFEdipine  30 mg Oral Daily  . polyethylene glycol  17 g Oral BID  . senna-docusate  1 tablet Oral BID  . simvastatin  10 mg Oral QHS  . tamsulosin  0.4 mg Oral Daily   Continuous Infusions: . methocarbamol (ROBAXIN) IV      LOS: 3 days   Kerney Elbe, DO [facility] PAGER is on AMION  If 7PM-7AM, please contact night-coverage [email or web address] Password [email or web address] 10/20/2018, 8:06 AM

## 2018-10-20 NOTE — Significant Event (Signed)
Rapid Response Event Note  Overview:Called to bedside d/t acute onset of R arm paralysis and aphasia. [id number]. Pt was on the [facility] and called out for assistance, RN noticed symptoms and called RR RN. Time Called: 2226 Arrival Time: 2228 Event Type: Neurologic  Initial Focused Assessment: Pt laying in bed, initally aphasic however pt began speaking during my exam. At that time, pt alert and oriented, mildly aphasic and dysarthric. NIH-2. R arm with no weakness on exam. Pupils 3 and brisk. Pt will move all extremities and follow commands. L leg movement restricted d/t L knee immobilizer s/p surgery today. BP-138/62, HR-70, RR-18, CBG-159  Intervention: Case discussed with Dr. Arora(neurology). Plan neuro checks q2h since symptoms resolved. Blount, NP updated as well. Plan of Care (if not transferred):  Event Summary: Name of Physician Notified: Kennon Holter, NP at 49   Consulting MD: Dr. Rory Percy @ 2238  Outcome: Stayed in room and stabalized  Event End Time: 2248  Dillard Essex

## 2018-10-20 NOTE — Progress Notes (Signed)
Patient has been NPO since midnight 7/30.

## 2018-10-21 ENCOUNTER — Encounter (HOSPITAL_COMMUNITY): Payer: Self-pay | Admitting: Orthopedic Surgery

## 2018-10-21 DIAGNOSIS — R29818 Other symptoms and signs involving the nervous system: Secondary | ICD-10-CM

## 2018-10-21 LAB — CBC WITH DIFFERENTIAL/PLATELET
Abs Immature Granulocytes: 0.03 10*3/uL (ref 0.00–0.07)
Basophils Absolute: 0 10*3/uL (ref 0.0–0.1)
Basophils Relative: 0 %
Eosinophils Absolute: 0 10*3/uL (ref 0.0–0.5)
Eosinophils Relative: 0 %
HCT: 31.4 % — ABNORMAL LOW (ref 39.0–52.0)
Hemoglobin: 10.5 g/dL — ABNORMAL LOW (ref 13.0–17.0)
Immature Granulocytes: 0 %
Lymphocytes Relative: 11 %
Lymphs Abs: 1 10*3/uL (ref 0.7–4.0)
MCH: 30.9 pg (ref 26.0–34.0)
MCHC: 33.4 g/dL (ref 30.0–36.0)
MCV: 92.4 fL (ref 80.0–100.0)
Monocytes Absolute: 0.8 10*3/uL (ref 0.1–1.0)
Monocytes Relative: 8 %
Neutro Abs: 7.5 10*3/uL (ref 1.7–7.7)
Neutrophils Relative %: 81 %
Platelets: 162 10*3/uL (ref 150–400)
RBC: 3.4 MIL/uL — ABNORMAL LOW (ref 4.22–5.81)
RDW: 12.2 % (ref 11.5–15.5)
WBC: 9.3 10*3/uL (ref 4.0–10.5)
nRBC: 0 % (ref 0.0–0.2)

## 2018-10-21 LAB — GLUCOSE, CAPILLARY
Glucose-Capillary: 122 mg/dL — ABNORMAL HIGH (ref 70–99)
Glucose-Capillary: 143 mg/dL — ABNORMAL HIGH (ref 70–99)
Glucose-Capillary: 146 mg/dL — ABNORMAL HIGH (ref 70–99)
Glucose-Capillary: 147 mg/dL — ABNORMAL HIGH (ref 70–99)
Glucose-Capillary: 149 mg/dL — ABNORMAL HIGH (ref 70–99)
Glucose-Capillary: 150 mg/dL — ABNORMAL HIGH (ref 70–99)
Glucose-Capillary: 203 mg/dL — ABNORMAL HIGH (ref 70–99)

## 2018-10-21 LAB — COMPREHENSIVE METABOLIC PANEL
ALT: 14 U/L (ref 0–44)
AST: 15 U/L (ref 15–41)
Albumin: 3.1 g/dL — ABNORMAL LOW (ref 3.5–5.0)
Alkaline Phosphatase: 35 U/L — ABNORMAL LOW (ref 38–126)
Anion gap: 9 (ref 5–15)
BUN: 19 mg/dL (ref 8–23)
CO2: 24 mmol/L (ref 22–32)
Calcium: 8.5 mg/dL — ABNORMAL LOW (ref 8.9–10.3)
Chloride: 102 mmol/L (ref 98–111)
Creatinine, Ser: 1.52 mg/dL — ABNORMAL HIGH (ref 0.61–1.24)
GFR calc Af Amer: 53 mL/min — ABNORMAL LOW (ref >60)
GFR calc non Af Amer: 46 mL/min — ABNORMAL LOW (ref >60)
Glucose, Bld: 148 mg/dL — ABNORMAL HIGH (ref 70–99)
Potassium: 4.5 mmol/L (ref 3.5–5.1)
Sodium: 135 mmol/L (ref 135–145)
Total Bilirubin: 0.9 mg/dL (ref 0.3–1.2)
Total Protein: 6.1 g/dL — ABNORMAL LOW (ref 6.5–8.1)

## 2018-10-21 LAB — PHOSPHORUS: Phosphorus: 4.3 mg/dL (ref 2.5–4.6)

## 2018-10-21 LAB — MAGNESIUM: Magnesium: 2 mg/dL (ref 1.7–2.4)

## 2018-10-21 MED ORDER — SORBITOL 70 % SOLN
960.0000 mL | TOPICAL_OIL | Freq: Once | ORAL | Status: DC
  Filled 2018-10-21: qty 473

## 2018-10-21 MED ORDER — POLYETHYLENE GLYCOL 3350 17 G PO PACK: 17.0000 g | PACK | Freq: Two times a day (BID) | ORAL | 0 refills | Status: DC

## 2018-10-21 MED ORDER — SENNA 8.6 MG PO TABS: 1.0000 | ORAL_TABLET | Freq: Two times a day (BID) | ORAL | 0 refills | Status: AC

## 2018-10-21 MED ORDER — BISACODYL 10 MG RE SUPP: 10.0000 mg | Freq: Every day | RECTAL | 0 refills | Status: DC | PRN

## 2018-10-21 MED ORDER — ONDANSETRON HCL 4 MG PO TABS: 4.0000 mg | ORAL_TABLET | Freq: Four times a day (QID) | ORAL | 0 refills | Status: DC | PRN

## 2018-10-21 NOTE — Progress Notes (Signed)
Occupational Therapy Evaluation Patient Details Name: Brad Brown MRN: [id number] DOB: 1945-12-27 Today's Date: 10/21/2018    History of Present Illness [place] was mowing grass on an incline and fell. He had immediate pain in his left knee and couldn't get up or walk. He was brought to the ED where x-rays and CT were consistent with a quad tendon rupture. Pt s/p quad tendon repair.   Clinical Impression   Pt admitted with above diagnosis. PTA, pt independent with ADLs. He plans to discharge to daughter's house. Currently needs min assist for LB ADLs and functional transfers with RW. Will continue to follow acutely to maximize safety and independence with ADLs.  Also, noted that, per chart review, pt experienced change in neurological status (right side weakness, aphasia) which quickly recovered. Pt reports he has had previous strokes. Therapist educated him on importance of immediately calling 911 if he experiences any CVA signs/symptoms at home.    Follow Up Recommendations  Home health OT;Supervision - Intermittent    Equipment Recommendations  3 in 1 bedside commode(RW)    Recommendations for Other Services       Precautions / Restrictions Precautions Precautions: Knee Required Braces or Orthoses: Knee Immobilizer - Left Knee Immobilizer - Left: On at all times Restrictions Weight Bearing Restrictions: No Other Position/Activity Restrictions: WBAT      Mobility Bed Mobility Overal bed mobility: Needs Assistance Bed Mobility: Supine to Sit;Sit to Supine     Supine to sit: Min assist Sit to supine: Min assist   General bed mobility comments: min assist with LE  Transfers Overall transfer level: Needs assistance Equipment used: Rolling walker (2 wheeled) Transfers: Sit to/from Stand Sit to Stand: Min assist         General transfer comment: cues for hand placement. assist for power up.    Balance Overall balance assessment: Mild deficits observed, not  formally tested                                         ADL either performed or assessed with clinical judgement   ADL Overall ADL's : Needs assistance/impaired Eating/Feeding: Independent;Sitting   Grooming: Min guard;Standing   Upper Body Bathing: Supervision/ safety;Sitting   Lower Body Bathing: Minimal assistance;Sit to/from stand   Upper Body Dressing : Supervision/safety;Sitting   Lower Body Dressing: Minimal assistance;Sit to/from stand   Toilet Transfer: Minimal assistance;Ambulation;BSC;RW   Toileting- Clothing Manipulation and Hygiene: Minimal assistance;Sit to/from stand       Functional mobility during ADLs: Min guard;Rolling walker General ADL Comments: Pt requiring min assist to don undergarment and pants due to left knee extension in KI.  Pt does have a reacher at home and therapist educated him on use of reacher for LB dressing.     Vision         Perception     Praxis      Pertinent Vitals/Pain Pain Assessment: 0-10 Pain Score: 3  Pain Location: left leg Pain Descriptors / Indicators: Discomfort Pain Intervention(s): Limited activity within patient's tolerance;Monitored during session;Repositioned     Hand Dominance     Extremity/Trunk Assessment Upper Extremity Assessment Upper Extremity Assessment: Overall WFL for tasks assessed   Lower Extremity Assessment Lower Extremity Assessment: Defer to PT evaluation LLE Deficits / Details: hip flex 2/5 abd/add 3/5 LLE: Unable to fully assess due to immobilization       Communication Communication Communication:  No difficulties   Cognition Arousal/Alertness: Awake/alert Behavior During Therapy: WFL for tasks assessed/performed Overall Cognitive Status: Within Functional Limits for tasks assessed                                     General Comments     Exercises   Shoulder Instructions      Home Living Family/patient expects to be discharged to:: Other  (Comment) Living Arrangements: Children Available Help at Discharge: Family;Available PRN/intermittently Type of Home: House Home Access: Level entry     Home Layout: One level     Bathroom Shower/Tub: Teacher, early years/pre: Standard     Home Equipment: Financial controller: Reacher Additional Comments: Pt plans to stay with daughter who works 3rd shift. He also reports he plans to sponge bathe.      Prior Functioning/Environment Level of Independence: Independent                 OT Problem List: Decreased strength;Decreased activity tolerance;Impaired balance (sitting and/or standing);Decreased range of motion;Decreased knowledge of use of DME or AE;Pain      OT Treatment/Interventions: Self-care/ADL training;DME and/or AE instruction;Therapeutic activities;Patient/family education;Balance training    OT Goals(Current goals can be found in the care plan section) Acute Rehab OT Goals Patient Stated Goal: to go to daughter's house today OT Goal Formulation: With patient Time For Goal Achievement: 11/04/18 Potential to Achieve Goals: Good  OT Frequency: Min 2X/week   Barriers to D/C:            Co-evaluation              AM-PAC OT "6 Clicks" Daily Activity     Outcome Measure Help from another person eating meals?: None Help from another person taking care of personal grooming?: A Little(standing) Help from another person toileting, which includes using toliet, bedpan, or urinal?: A Little Help from another person bathing (including washing, rinsing, drying)?: A Little Help from another person to put on and taking off regular upper body clothing?: None Help from another person to put on and taking off regular lower body clothing?: A Little 6 Click Score: 20   End of Session Equipment Utilized During Treatment: Rolling walker;Left knee immobilizer Nurse Communication: Mobility status  Activity Tolerance: Patient tolerated  treatment well Patient left: in bed;with call bell/phone within reach;with nursing/sitter in room  OT Visit Diagnosis: Unsteadiness on feet (R26.81);Other abnormalities of gait and mobility (R26.89);Pain Pain - Right/Left: Left Pain - part of body: Leg                Time: 1231-1250 OT Time Calculation (min): 19 min Charges:  OT General Charges $OT Visit: 1 Visit OT Evaluation $OT Eval Low Complexity: [place], [place] OTR/L [place] [phone] 10/21/2018, 1:49 PM

## 2018-10-21 NOTE — Evaluation (Signed)
Physical Therapy Evaluation Patient Details Name: Brad Brown MRN: [id number] DOB: 1945-08-02 Today's Date: 10/21/2018   History of Present Illness  Brad Brown was mowing grass on an incline and fell. He had immediate pain in his left knee and couldn't get up or walk. He was brought to the ED where x-rays and CT were consistent with a quad tendon rupture. Pt s/p quad tendon repair.  Clinical Impression  Pt presents with dependencies in mobility secondary to the above diagnosis. Pt reports he will be d/c home to his daughters house who can provide assistance. Pt is eager to d/c home. Pt has completed gait training and home exercise program. Pt did have difficulty initially advancing left LE, but with his shoes and lift insert he was able to move it independently. Feel pt would benefit from HHPT to reinforce LE exercises and advance gait. Pt is currently min assist with transfers. Pt agreeable to wear KI at all times and agrees with HHPT services. Pt is d/c from acute PT with recommendation for HHPT services. Pt will need an order for a RW.     Follow Up Recommendations Home health PT;Supervision for mobility/OOB    Equipment Recommendations  Rolling walker with 5" wheels    Recommendations for Other Services       Precautions / Restrictions Precautions Precautions: Knee Required Braces or Orthoses: Knee Immobilizer - Left Knee Immobilizer - Left: On at all times Restrictions Weight Bearing Restrictions: No Other Position/Activity Restrictions: WBAT      Mobility  Bed Mobility Overal bed mobility: Needs Assistance Bed Mobility: Supine to Sit     Supine to sit: Min assist     General bed mobility comments: min assist with LE  Transfers Overall transfer level: Needs assistance Equipment used: Rolling walker (2 wheeled) Transfers: Sit to/from Stand Sit to Stand: Min assist         General transfer comment: cues for hand placement, increasing forward trunk  flexion,  Ambulation/Gait Ambulation/Gait assistance: Min guard   Assistive device: Rolling walker (2 wheeled) Gait Pattern/deviations: Step-through pattern;Decreased stride length Gait velocity: decr   General Gait Details: pt had increased difficulty advancing L LE, pt did much better when he wore his shoes and lift due to leg length discrepency. Initially pt required min assist to advance l foot, but then was able to due it independently  Stairs            Wheelchair Mobility    Modified Rankin (Stroke Patients Only)       Balance Overall balance assessment: Mild deficits observed, not formally tested                                           Pertinent Vitals/Pain Pain Assessment: 0-10 Pain Score: 3  Pain Descriptors / Indicators: Discomfort Pain Intervention(s): Limited activity within patient's tolerance;Monitored during session    Home Living Family/patient expects to be discharged to:: Other (Comment)(going to daughters house) Living Arrangements: Children Available Help at Discharge: Family Type of Home: Apartment Home Access: Level entry     Home Layout: One level Home Equipment: None      Prior Function Level of Independence: Independent               Hand Dominance        Extremity/Trunk Assessment   Upper Extremity Assessment Upper Extremity Assessment: Defer to OT evaluation  Lower Extremity Assessment Lower Extremity Assessment: LLE deficits/detail LLE Deficits / Details: hip flex 2/5 abd/add 3/5 LLE: Unable to fully assess due to immobilization       Communication   Communication: No difficulties  Cognition Arousal/Alertness: Awake/alert Behavior During Therapy: WFL for tasks assessed/performed Overall Cognitive Status: Within Functional Limits for tasks assessed                                        General Comments General comments (skin integrity, edema, etc.): also assisted pt  wiith BSC transfers. min assist    Exercises Total Joint Exercises Ankle Circles/Pumps: AROM;Strengthening;Both;10 reps;Supine Hip ABduction/ADduction: AAROM;Strengthening;Left;10 reps;Supine Straight Leg Raises: AAROM;Strengthening;Left;10 reps;Supine   Assessment/Plan    PT Assessment All further PT needs can be met in the next venue of care  PT Problem List Decreased strength;Decreased mobility;Decreased range of motion;Decreased activity tolerance;Decreased balance;Decreased safety awareness       PT Treatment Interventions      PT Goals (Current goals can be found in the Care Plan section)       Frequency     Barriers to discharge        Co-evaluation               AM-PAC PT "6 Clicks" Mobility  Outcome Measure Help needed turning from your back to your side while in a flat bed without using bedrails?: A Little Help needed moving from lying on your back to sitting on the side of a flat bed without using bedrails?: A Little Help needed moving to and from a bed to a chair (including a wheelchair)?: A Little Help needed standing up from a chair using your arms (e.g., wheelchair or bedside chair)?: A Little Help needed to walk in hospital room?: A Little Help needed climbing 3-5 steps with a railing? : A Little 6 Click Score: 18    End of Session Equipment Utilized During Treatment: Gait belt Activity Tolerance: Patient tolerated treatment well Patient left: in chair;with call bell/phone within reach Nurse Communication: Mobility status PT Visit Diagnosis: Muscle weakness (generalized) (M62.81);Difficulty in walking, not elsewhere classified (R26.2)    Time: 3244-0102 PT Time Calculation (min) (ACUTE ONLY): 42 min   Charges:   PT Evaluation $PT Eval Low Complexity: 1 Low PT Treatments $Gait Training: 8-22 mins $Therapeutic Exercise: 8-22 mins        Brad Brown, PT  Brad Brown 10/21/2018, 11:50 AM

## 2018-10-21 NOTE — Discharge Summary (Signed)
Physician Discharge Summary  Brad Brown JYN:[id number] DOB: 04-04-1945 DOA: 10/17/2018  PCP: Jearld Fenton, NP  Admit date: 10/17/2018 Discharge date: 10/21/2018  Admitted From: Home Disposition: Home with Home Health PT/OT  Recommendations for Outpatient Follow-up:  1. Follow up with PCP in 1-2 weeks 2. Follow up with Orthopedic Surgery within 10 days  3. Follow-up with Neurology in outpatient setting in 1-2 weeks 4. For follow-up with Cardiology at regular scheduled appointment 5. Please obtain CMP/CBC, Mag, Phos in one week 6. Please follow up on the following pending results:  Home Health: Yes  Equipment/Devices: Conservation officer, nature with 5" Wheels; 3in1    Discharge Condition: Stable CODE STATUS: FULL CODE  Diet recommendation: Heart Healthy Carb Modified Diet   Brief/Interim Summary: The patient is a 73 year old male with a past medical history significant for but not limited to atrial fibrillation, history of Brugada syndrome status post ICD insertion, diabetes mellitus, CAD, COPD, CKD, GERD, hypertension, history of mild cardiomyopathy, hyperlipidemia, seizure disorder, and history of CVA as well as other comorbidities who presents with left knee pain and swelling after falling downhill mowing his lawn and landing on his left leg.  He was admitted for a left quadriceps tendon rupture with hemarthrosis and Cardiology was consulted for further evaluation recommendations for preoperative assessment and it was felt that he was an acceptable risk to proceed with surgery without further cardiac work-up.  Dr. Angelena Form recommending holding Xarelto for surgery with plans to restart as soon as he is cleared by Ortho.  Orthopedic surgery evaluated and recommending repair and this was done yesterday by Dr. Percell Miller  Patient did well postoperatively however last night he had a rapid response called on him as he became acutely aphasic and was unable to use his right arm.  Neurology Dr. Malen Gauze  waiting but patient symptoms resolved very quickly and felt that he did not actually have a stroke.  Neurology recommended neurochecks every 2 hours and symptoms relevant patient had no recurrence of this.  This morning he is doing well and had no neurological deficits and he felt back to baseline.  He ambulated with PT without issues and had a bowel movement prior to discharge and was deemed stable.  He will need follow-up with PCP, orthopedic surgery and neurology in outpatient setting as he has been deemed stable for discharge at this time.  Discharge Diagnoses:  Active Problems:   HLD (hyperlipidemia)   Seizures (HCC)   Type 2 diabetes mellitus with stage 1 chronic kidney disease, without long-term current use of insulin (HCC)   Automatic implantable cardioverter-defibrillator in situ   Hypertension   Brugada syndrome   MGUS (monoclonal gammopathy of unknown significance)   Atrial fibrillation (HCC)   GERD (gastroesophageal reflux disease)   BPH (benign prostatic hyperplasia)   Fall at home, initial encounter   Acute traumatic internal derangement of knee, initial encounter   Acute traumatic internal derangement of left knee   AcuteTraumatic Internal Derangement of knee. Hematoma. Tendon rupture, status post repair postoperative day 1 -Continue Pain control with Hydrocodone-Acetaminophen 1-2 po q6hprn Moderate Pain and with methocarbamol 5 mg p.o./IV every 6 as needed -Holding anticoagulation.Resume when ok with Orthopedics postoperative day 1 -Management as per orthopedics and they operated yesterday and patient did well -Cardiology consulted for further pre-op clearance given extensive cardiac disease and he is an acceptable risk for Surgical Intervention  -We will need to follow-up with orthopedic in the outpatient setting  Type 2 Diabetes Mellitus -Continue to Monitor on  Sensitive Novolog SSI AC -CBG's ranging from 124-203; Blood Sugar this AM on CMP was 131 -Last HbA1c was  6.1; May be beneficial to repeat  in the outpatient setting -Follow-up with PCP in the outpatient setting  A Fib. s/p PPM -Hx of Brugadasyndrome.  -Home regimen includes Anticoagulation with Xarelto andAppears to be on BB+nifedipine.  -No acute cardiopulmonary symptoms. -Holding xarelto due to bleed and pending Surgical Intervention -Cardiology recommending Resuming as soon as cleared by Orthopedic Surgery -Follow-up with cardiology in outpatient setting  Hypertension -BP is stable at 120/58 -Continue home medications with Losartan 100 mg po daily, BB with Metoprolol Tartrate 12.5 mg po BID, Nifedipine 30 mg po Daily -Continue to Monitor  HLD -C/w Simvastatin 10 mg po Daily   MGUS(monoclonal gammopathy of unknown significance)  -Chronic and currently stable  BPH -Stable  -Continue home medications of Tamsulosin 0.4 mg po Daily   History of Seizure Disorder -Stable -C/w Home Keppraof 1000 mg po qEvening and 500 mg po Morning  -Follow-up neurology in outpatient setting  Mild period of aphasia and right arm weakness -Resolved rapidly -Patient feels this was drug-induced but is unclear what actually happened -Neurology Dr. Rory Percy recommended no stroke work-up and recommended continue monitor neurochecks every 2 -Patient had no neurological deficits and will need to follow-up with neurology in the outpatient setting as he is deemed stable  Constipation -Bowel regimen was started with senna docusate 1 tab p.o. twice daily, MiraLAX 17 g p.o. twice daily, as well as bisacodyl 10 mg rectal suppository for moderate constipation -Still has not had a Bowel Movement -If necessary will order an enema this is not necessary as patient had a large bowel movement this morning  CKD stage III -Patient's BUN/creatinine appears similar to priors and was 19/1.52 -Continue monitor trend-avoid nephrotoxic medications if possible but we will continue Losartan at this time. -Avoid  contrast dyes, and hypotension -Continue monitor and trend renal function -Repeat CMP as an outpateint   Hyperbilirubinemia, improving  -Mild at 1.4 and improved to 0.9 -Continue to monitor and trend and this is likely reactive -Repeat CMP as an outpaitent   Normocytic Anemia/Anemia of Chronic Kidney Disease -Hemoglobin/hematocrit remains relatively stable and went from 12.7/30.3 -> 11.9/35.8 -> 11.2/34.4 -> 10.5/31.4 -Continue monitor for signs and symptoms of bleeding now that Xarelto has been restarted; currently no overt bleeding noted -Check Anemia Panel this AM and showed a level of 43, U IBC of 184, TIBC of 227, saturation ratios of 19%, ferritin level 181, folate level 32.4, and vitamin B12 level 400 -Repeat CBC  as an outpatient   Discharge Instructions  Discharge Instructions    Call MD for:  difficulty breathing, headache or visual disturbances   Complete by: As directed    Call MD for:  extreme fatigue   Complete by: As directed    Call MD for:  hives   Complete by: As directed    Call MD for:  persistant dizziness or light-headedness   Complete by: As directed    Call MD for:  persistant nausea and vomiting   Complete by: As directed    Call MD for:  redness, tenderness, or signs of infection (pain, swelling, redness, odor or green/yellow discharge around incision site)   Complete by: As directed    Call MD for:  severe uncontrolled pain   Complete by: As directed    Call MD for:  temperature >100.4   Complete by: As directed    Diet - low sodium  heart healthy   Complete by: As directed    Diet Carb Modified   Complete by: As directed    Discharge instructions   Complete by: As directed    You were cared for by a hospitalist during your hospital stay. If you have any questions about your discharge medications or the care you received while you were in the hospital after you are discharged, you can call the unit and ask to speak with the hospitalist on call if  the hospitalist that took care of you is not available. Once you are discharged, your primary care physician will handle any further medical issues. Please note that NO REFILLS for any discharge medications will be authorized once you are discharged, as it is imperative that you return to your primary care physician (or establish a relationship with a primary care physician if you do not have one) for your aftercare needs so that they can reassess your need for medications and monitor your lab values.  Follow up with PCP, Orthopedic Surgery, and Neurology in the outpatient setting. Take all medications as prescribed. If symptoms change or worsen please return to the ED for evaluation   Increase activity slowly   Complete by: As directed      Allergies as of 10/21/2018      Reactions   Lipitor [atorvastatin] Rash      Medication List    STOP taking these medications   acetaminophen 500 MG tablet Commonly known as: TYLENOL     TAKE these medications   baclofen 10 MG tablet Commonly known as: LIORESAL Take 1 tablet (10 mg total) by mouth 2 (two) times daily as needed for muscle spasms.   bisacodyl 10 MG suppository Commonly known as: DULCOLAX Place 1 suppository (10 mg total) rectally daily as needed for moderate constipation.   ciclopirox 0.77 % cream Commonly known as: LOPROX Apply 1 application topically 2 (two) times daily as needed (for rash).   Clotrimazole 1 % Oint Apply 1 application topically daily. What changed:   when to take this  reasons to take this   Fish Oil 1200 MG Caps Take 1,200 mg by mouth daily.   fluticasone 110 MCG/ACT inhaler Commonly known as: FLOVENT HFA Inhale 1 puff into the lungs 2 (two) times daily.   glipiZIDE 5 MG tablet Commonly known as: GLUCOTROL Take 1 tablet (5 mg total) by mouth 2 (two) times daily before a meal. MUST SCHEDULE ANNUAL EXAM   HYDROcodone-acetaminophen 5-325 MG tablet Commonly known as: Norco Take 1-2 tablets by  mouth every 6 (six) hours as needed for severe pain (Take tylenol for mild and moderate pain).   levETIRAcetam 500 MG tablet Commonly known as: KEPPRA Take 500-1,000 mg by mouth See admin instructions. Take one tablet by mouth in the morning and two tablets by mouth at night   losartan 100 MG tablet Commonly known as: COZAAR TAKE 1 TABLET BY MOUTH EVERY DAY   metoprolol tartrate 25 MG tablet Commonly known as: LOPRESSOR Take 12.5 mg by mouth 2 (two) times daily.   multivitamin tablet Take 1 tablet by mouth daily.   naftifine 1 % cream Commonly known as: NAFTIN Apply 1 application topically 2 (two) times daily as needed (For  infection between toes).   Niacin (Antihyperlipidemic) 500 MG Tabs Take 500 mg by mouth daily.   NIFEdipine 30 MG 24 hr tablet Commonly known as: ADALAT CC Take 30 mg by mouth daily.   ondansetron 4 MG tablet Commonly known as: ZOFRAN  Take 1 tablet (4 mg total) by mouth every 6 (six) hours as needed for nausea.   polyethylene glycol 17 g packet Commonly known as: MIRALAX / GLYCOLAX Take 17 g by mouth 2 (two) times daily.   senna 8.6 MG Tabs tablet Commonly known as: SENOKOT Take 1 tablet (8.6 mg total) by mouth 2 (two) times daily.   simvastatin 20 MG tablet Commonly known as: ZOCOR Take 10 mg by mouth at bedtime.   Symbicort 160-4.5 MCG/ACT inhaler Generic drug: budesonide-formoterol INHALE 2 PUFFS INTO THE LUNGS 2 (TWO) TIMES DAILY.   tamsulosin 0.4 MG Caps capsule Commonly known as: FLOMAX Take 0.4 mg by mouth daily.   Xarelto 20 MG Tabs tablet Generic drug: rivaroxaban Take 20 mg by mouth daily.            Durable Medical Equipment  (From admission, onward)         Start     Ordered   10/21/18 1452  For home use only DME 3 n 1  Once     10/21/18 1451   10/21/18 1254  For home use only DME Walker rolling  Once    Question:  Patient needs a walker to treat with the following condition  Answer:  Quadriceps tendon rupture    10/21/18 1253         Follow-up Information    Renette Butters, MD. Schedule an appointment as soon as possible for a visit in 10 days.   Specialty: Orthopedic Surgery Contact information: [address] [place] [address] [place] [address] [phone]        Home, [facility] Follow up.   Specialty: [place] Why: A representative from [facility] will contact you to arrange start date and time for your therapy. Contact information: [address] [place] [address] [phone]          Allergies  Allergen Reactions  . Lipitor [Atorvastatin] Rash   Consultations:  Orthopedic Surgery  Cardiology  Case was discussed with Neurology  Procedures/Studies: Ct Knee Left Wo Contrast  Result Date: 10/17/2018 CLINICAL DATA:  Left knee pain and swelling after falling while mowing lawn. EXAM: CT OF THE LEFT KNEE WITHOUT CONTRAST TECHNIQUE: Multidetector CT imaging of the left knee was performed according to the standard protocol. Multiplanar CT image reconstructions were also generated. COMPARISON:  Radiographs same date FINDINGS: Bones/Joint/Cartilage No evidence of acute fracture or dislocation. The joint spaces are preserved. There is a moderate-size, complex suprapatellar joint effusion without evidence of intra-articular loose body or foreign body. Ligaments Suboptimally assessed by CT. The cruciate ligaments appear grossly intact. Muscles and Tendons The distal quadriceps tendon is poorly visualized and is likely torn from its patellar insertion. On the reformatted images, the tendon appears to be retracted approximately 2.9 cm. There is no inferior subluxation of the patella or patellar tendon laxity. The lower leg musculature appears normal. Soft tissues There is prominent suprapatellar soft tissue swelling with ill-defined hematoma anterior to the distal quadriceps tendon and the patella. IMPRESSION: 1. Prominent soft tissue swelling superior  to the patella with ill-defined hematoma around the distal quadriceps tendon and patella. Suspected rupture or high-grade partial tear of the distal quadriceps tendon without inferior displacement of the patella. 2. Complex knee joint effusion. 3. No evidence of acute fracture. Electronically Signed   By: Richardean Sale M.D.   On: 10/17/2018 18:08   Dg Chest Port 1 View  Result Date: 10/17/2018 CLINICAL DATA:  Preop  for knee surgery EXAM: PORTABLE CHEST 1 VIEW COMPARISON:  November 30, 2014 FINDINGS: There is mild enlargement of the cardiac silhouette. A left-sided pacemaker seen with the lead tip in the right ventricle. The lungs are clear. No large airspace consolidation. No acute osseous findings. Bilateral shoulder arthropathy is seen. IMPRESSION: No acute cardiopulmonary process. Electronically Signed   By: Prudencio Pair M.D.   On: 10/17/2018 23:56   Dg Knee Complete 4 Views Left  Result Date: 10/17/2018 CLINICAL DATA:  73 year old male status post fall onto knee with pain and swelling. EXAM: LEFT KNEE - COMPLETE 4+ VIEW COMPARISON:  None. FINDINGS: Bone mineralization is within normal limits. The patella appears to remain intact although there is severe soft tissue swelling anterior and superior to the patella. There is also evidence of a suprapatellar joint effusion which may be hyperdense. Joint spaces and alignment are preserved. No acute osseous abnormality identified. IMPRESSION: No fracture identified but large hematoma anterior and superior to the patella and evidence of a joint effusion. Electronically Signed   By: Genevie Ann M.D.   On: 10/17/2018 15:42   Dg Knee Complete 4 Views Right  Result Date: 10/17/2018 CLINICAL DATA:  Right knee pain after a fall today. EXAM: RIGHT KNEE - COMPLETE 4+ VIEW COMPARISON:  None. FINDINGS: No evidence of fracture, dislocation, or joint effusion. No evidence of arthropathy or other focal bone abnormality. Soft tissues are unremarkable. IMPRESSION: Negative.  Electronically Signed   By: Logan Bores M.D.   On: 10/17/2018 21:01     Subjective: Seen and examined at bedside and he is doing fairly well and denied complaints.  States that he is back to baseline and had an scare this morning but states that this happened before.  Neurology recommended no further work-up.  He is deemed stable for discharge after PT evaluated and worked with him and after he had a bowel movement.  He will follow-up with PCP, orthopedic surgery as well as neurology in the outpatient setting he is understandable and agreeable with plan of care.  Discharge Exam: Vitals:   10/21/18 0833 10/21/18 0834  BP:  (!) 120/58  Pulse:  83  Resp:  18  Temp:  98.5 F (36.9 C)  SpO2: 98% 98%   Vitals:   10/21/18 0014 10/21/18 0423 10/21/18 0833 10/21/18 0834  BP: 123/72 121/69  (!) 120/58  Pulse: 84 85  83  Resp: 16 16  18   Temp: 98.5 F (36.9 C) 98.8 F (37.1 C)  98.5 F (36.9 C)  TempSrc: Oral Oral  Oral  SpO2: 96% 93% 98% 98%  Weight:      Height:       General: Pt is alert, awake, not in acute distress Cardiovascular: Irregularly Irregular, S1/S2 +, no rubs, no gallops Respiratory: Diminished bilaterally, no wheezing, no rhonchi Abdominal: Soft, NT, Mildly distended , bowel sounds + Extremities: Left Leg Immobilized  no cyanosis  The results of significant diagnostics from this hospitalization (including imaging, microbiology, ancillary and laboratory) are listed below for reference.    Microbiology: Recent Results (from the past 240 hour(s))  SARS Coronavirus 2 (CEPHEID - Performed in [place] hospital lab), Hosp Order     Status: None   Collection Time: 10/17/18 11:05 PM   Specimen: Nasopharyngeal Swab  Result Value Ref Range Status   SARS Coronavirus 2 NEGATIVE NEGATIVE Final    Comment: (NOTE) If result is NEGATIVE SARS-CoV-2 target nucleic acids are NOT DETECTED. The SARS-CoV-2 RNA is generally detectable in upper  and lower  respiratory specimens  during the acute phase of infection. The lowest  concentration of SARS-CoV-2 viral copies this assay can detect is 250  copies / mL. A negative result does not preclude SARS-CoV-2 infection  and should not be used as the sole basis for treatment or other  patient management decisions.  A negative result may occur with  improper specimen collection / handling, submission of specimen other  than nasopharyngeal swab, presence of viral mutation(s) within the  areas targeted by this assay, and inadequate number of viral copies  (<250 copies / mL). A negative result must be combined with clinical  observations, patient history, and epidemiological information. If result is POSITIVE SARS-CoV-2 target nucleic acids are DETECTED. The SARS-CoV-2 RNA is generally detectable in upper and lower  respiratory specimens dur ing the acute phase of infection.  Positive  results are indicative of active infection with SARS-CoV-2.  Clinical  correlation with patient history and other diagnostic information is  necessary to determine patient infection status.  Positive results do  not rule out bacterial infection or co-infection with other viruses. If result is PRESUMPTIVE POSTIVE SARS-CoV-2 nucleic acids MAY BE PRESENT.   A presumptive positive result was obtained on the submitted specimen  and confirmed on repeat testing.  While 2019 novel coronavirus  (SARS-CoV-2) nucleic acids may be present in the submitted sample  additional confirmatory testing may be necessary for epidemiological  and / or clinical management purposes  to differentiate between  SARS-CoV-2 and other Sarbecovirus currently known to infect humans.  If clinically indicated additional testing with an alternate test  methodology [phone]) is advised. The SARS-CoV-2 RNA is generally  detectable in upper and lower respiratory sp ecimens during the acute  phase of infection. The expected result is Negative. Fact Sheet for Patients:   StrictlyIdeas.no Fact Sheet for Healthcare Providers: BankingDealers.co.za This test is not yet approved or cleared by the [place] FDA and has been authorized for detection and/or diagnosis of SARS-CoV-2 by FDA under an Emergency Use Authorization (EUA).  This EUA will remain in effect (meaning this test can be used) for the duration of the COVID-19 declaration under Section 564(b)(1) of the Act, 21 U.S.C. section 360bbb-3(b)(1), unless the authorization is terminated or revoked sooner. Performed at [facility], [place] [address], [place], [place] [address]   MRSA PCR Screening     Status: None   Collection Time: 10/18/18 12:36 AM   Specimen: Nasopharyngeal  Result Value Ref Range Status   MRSA by PCR NEGATIVE NEGATIVE Final    Comment:        The GeneXpert MRSA Assay (FDA approved for NASAL specimens only), is one component of a comprehensive MRSA colonization surveillance program. It is not intended to diagnose MRSA infection nor to guide or monitor treatment for MRSA infections. Performed at [facility], [place] [address] [place],  [address]     Labs: BNP (last 3 results) No results for input(s): BNP in the last 8760 hours. Basic Metabolic Panel: Recent Labs  Lab 10/17/18 2148 10/18/18 0126 10/19/18 0829 10/20/18 0302 10/21/18 0609  NA 137 136 136 137 135  K 4.2 4.0 4.2 4.5 4.5  CL 105 105 104 102 102  CO2 22 24 24 26 24   GLUCOSE 118* 116* 146* 131* 148*  BUN 16 14 14 16 19   CREATININE 1.47* 1.39* 1.50* 1.52* 1.52*  CALCIUM 8.9 8.8* 8.9 8.8* 8.5*  MG  --   --  1.9 2.0  2.0  PHOS  --   --  3.9 4.3 4.3   Liver Function Tests: Recent Labs  Lab 10/19/18 0829 10/20/18 0302 10/21/18 0609  AST 17 15 15   ALT 18 17 14   ALKPHOS 44 36* 35*  BILITOT 1.4* 1.0 0.9  PROT 6.9 6.4* 6.1*  ALBUMIN 3.7 3.4* 3.1*   No results for input(s): LIPASE, AMYLASE in the last 168 hours. No results for input(s):  AMMONIA in the last 168 hours. CBC: Recent Labs  Lab 10/17/18 2148 10/18/18 0126 10/19/18 0829 10/20/18 0302 10/21/18 0609  WBC 8.6 7.0 7.7 6.7 9.3  NEUTROABS  --   --  5.5 4.8 7.5  HGB 12.7* 12.0* 11.9* 11.2* 10.5*  HCT 38.3* 35.7* 35.8* 34.4* 31.4*  MCV 92.3 90.2 91.8 94.0 92.4  PLT 173 170 167 153 162   Cardiac Enzymes: No results for input(s): CKTOTAL, CKMB, CKMBINDEX, TROPONINI in the last 168 hours. BNP: Invalid input(s): POCBNP CBG: Recent Labs  Lab 10/21/18 0015 10/21/18 0426 10/21/18 0741 10/21/18 1156 10/21/18 1600  GLUCAP 143* 149* 146* 147* 203*   D-Dimer No results for input(s): DDIMER in the last 72 hours. Hgb A1c No results for input(s): HGBA1C in the last 72 hours. Lipid Profile No results for input(s): CHOL, HDL, LDLCALC, TRIG, CHOLHDL, LDLDIRECT in the last 72 hours. Thyroid function studies No results for input(s): TSH, T4TOTAL, T3FREE, THYROIDAB in the last 72 hours.  Invalid input(s): FREET3 Anemia work up Recent Labs    10/20/18 0852  VITAMINB12 400  FOLATE 32.4  FERRITIN 181  TIBC 227*  IRON 43*  RETICCTPCT 1.3   Urinalysis    Component Value Date/Time   LABSPEC 1.015 03/18/2008 1722   PHURINE 5.5 03/18/2008 1722   GLUCOSEU NEGATIVE 03/18/2008 1722   HGBUR NEGATIVE 03/18/2008 1722   BILIRUBINUR NEGATIVE 03/18/2008 1722   KETONESUR NEGATIVE 03/18/2008 1722   PROTEINUR NEGATIVE 03/18/2008 1722   UROBILINOGEN 0.2 03/18/2008 1722   NITRITE NEGATIVE 03/18/2008 1722   LEUKOCYTESUR  03/18/2008 1722    NEGATIVE Biochemical Testing Only. Please order routine urinalysis from main lab if confirmatory testing is needed.   Sepsis Labs Invalid input(s): PROCALCITONIN,  WBC,  LACTICIDVEN Microbiology Recent Results (from the past 240 hour(s))  SARS Coronavirus 2 (CEPHEID - Performed in [place] hospital lab), Hosp Order     Status: None   Collection Time: 10/17/18 11:05 PM   Specimen: Nasopharyngeal Swab  Result Value Ref Range  Status   SARS Coronavirus 2 NEGATIVE NEGATIVE Final    Comment: (NOTE) If result is NEGATIVE SARS-CoV-2 target nucleic acids are NOT DETECTED. The SARS-CoV-2 RNA is generally detectable in upper and lower  respiratory specimens during the acute phase of infection. The lowest  concentration of SARS-CoV-2 viral copies this assay can detect is 250  copies / mL. A negative result does not preclude SARS-CoV-2 infection  and should not be used as the sole basis for treatment or other  patient management decisions.  A negative result may occur with  improper specimen collection / handling, submission of specimen other  than nasopharyngeal swab, presence of viral mutation(s) within the  areas targeted by this assay, and inadequate number of viral copies  (<250 copies / mL). A negative result must be combined with clinical  observations, patient history, and epidemiological information. If result is POSITIVE SARS-CoV-2 target nucleic acids are DETECTED. The SARS-CoV-2 RNA is generally detectable in upper and lower  respiratory specimens dur ing the acute phase of infection.  Positive  results are indicative of active infection with SARS-CoV-2.  Clinical  correlation with patient history and other diagnostic information is  necessary to determine patient infection status.  Positive results do  not rule out bacterial infection or co-infection with other viruses. If result is PRESUMPTIVE POSTIVE SARS-CoV-2 nucleic acids MAY BE PRESENT.   A presumptive positive result was obtained on the submitted specimen  and confirmed on repeat testing.  While 2019 novel coronavirus  (SARS-CoV-2) nucleic acids may be present in the submitted sample  additional confirmatory testing may be necessary for epidemiological  and / or clinical management purposes  to differentiate between  SARS-CoV-2 and other Sarbecovirus currently known to infect humans.  If clinically indicated additional testing with an alternate  test  methodology [phone]) is advised. The SARS-CoV-2 RNA is generally  detectable in upper and lower respiratory sp ecimens during the acute  phase of infection. The expected result is Negative. Fact Sheet for Patients:  StrictlyIdeas.no Fact Sheet for Healthcare Providers: BankingDealers.co.za This test is not yet approved or cleared by the [place] FDA and has been authorized for detection and/or diagnosis of SARS-CoV-2 by FDA under an Emergency Use Authorization (EUA).  This EUA will remain in effect (meaning this test can be used) for the duration of the COVID-19 declaration under Section 564(b)(1) of the Act, 21 U.S.C. section 360bbb-3(b)(1), unless the authorization is terminated or revoked sooner. Performed at [facility], [place] [address], [place], [place] [address]   MRSA PCR Screening     Status: None   Collection Time: 10/18/18 12:36 AM   Specimen: Nasopharyngeal  Result Value Ref Range Status   MRSA by PCR NEGATIVE NEGATIVE Final    Comment:        The GeneXpert MRSA Assay (FDA approved for NASAL specimens only), is one component of a comprehensive MRSA colonization surveillance program. It is not intended to diagnose MRSA infection nor to guide or monitor treatment for MRSA infections. Performed at [facility], [place] [address] [place], [place] [address]    Time coordinating discharge: 35 minutes  SIGNED:  Kerney Elbe, DO [facility] 10/21/2018, 5:38 PM Pager is on [place]  If 7PM-7AM, please contact night-coverage [email or web address] Password [email or web address]

## 2018-10-21 NOTE — Plan of Care (Signed)
  Problem: Elimination: Goal: Will not experience complications related to bowel motility Outcome: Progressing   Problem: Safety: Goal: Ability to remain free from injury will improve Outcome: Progressing   

## 2018-10-21 NOTE — Progress Notes (Signed)
Rapid Response Nurse called d/t noted change of pt. Pt found on the BSC, aphasic, RUE flaccid, RLE unable to move. Transferred pt to the bed. Noted pt trying to talk, (word salad) unable to grip with Right hand. RRN Mindy at the bedside and continued Neuro assessment with Stroke Scale implemented. Dr.Blackmon made aware and Neuro MD contacted.   Pt slowly regained baseline status, able to talk with clarity, no slurring, able to control RUE and RLE. Orders to continue neuro checks and if it happens again to call " code stroke" per RRN Mindy.   Pt with no signs of stroke symptoms  at this time. Monitoring continue per MD order and RRN advice.

## 2018-10-21 NOTE — Care Management Important Message (Signed)
Important Message  Patient Details  Name: Brad Brown MRN: [id number] Date of Birth: [birth year]   Medicare Important Message Given:  Yes     Memory Argue 10/21/2018, 2:19 PM

## 2018-10-21 NOTE — TOC Initial Note (Signed)
Transition of Care [facility]) - Initial/Assessment Note    Patient Details  Name: Brad Brown MRN: [id number] Date of Birth: 1945/10/26  Transition of Care [facility]) CM/SW Contact:    Ninfa Meeker, RN Phone Number: [phone] (working remotely) 10/21/2018, 3:00 PM  Clinical Narrative:  73 yr old male  S/p  Left quad tendon repair. Case manager spoke with patient via telephone, concerning discharge plan and DME. Referral for Home Health agency called to Brad Brown, [facility]. Patient says he is going to his daughter's home at discharge, she doesn't have stairs. Her name is Brad Brown, [id number]. Case manager has tried to reach her to get address, patient doesn't remember it. Left voice message. CM will provide her number to [place]. DME will be delivered to patient's room.            Expected Discharge Plan: [place] Barriers to Discharge: No Barriers Identified   Patient Goals and CMS Choice   CMS Medicare.gov Compare Post Acute Care list provided to:: Patient Choice offered to / list presented to : Patient  Expected Discharge Plan and Services Expected Discharge Plan: [place]   Discharge Planning Services: CM Consult Post Acute Care Choice: Home Health, Durable Medical Equipment RW Agency: [facility] Living arrangements for the past 2 months: Single Family Home Expected Discharge Date: 10/21/18                         HH Arranged: PT HH Agency: [facility] Date [place]: 10/21/18 Time [place]: 71 Representative spoke with at [place]: Brad Brown  Prior Living Arrangements/Services Living arrangements for the past 2 months: [place]     Do you feel safe going back to the place where you live?: Yes          Current home services: Home PT, DME    Activities of Daily Living Home Assistive Devices/Equipment: Scales, Blood  pressure cuff, Grab bars in shower ADL Screening (condition at time of admission) Patient's cognitive ability adequate to safely complete daily activities?: Yes Is the patient deaf or have difficulty hearing?: No Does the patient have difficulty seeing, even when wearing glasses/contacts?: No Does the patient have difficulty concentrating, remembering, or making decisions?: No Patient able to express need for assistance with ADLs?: Yes Does the patient have difficulty dressing or bathing?: No Independently performs ADLs?: Yes (appropriate for developmental age) Does the patient have difficulty walking or climbing stairs?: No Weakness of Legs: None Weakness of Arms/Hands: None  Permission Sought/Granted Permission sought to share information with : Case Manager                Emotional Assessment       Orientation: : Oriented to Self, Oriented to Place, Oriented to  Time, Oriented to Situation Alcohol / Substance Use: Not Applicable    Admission diagnosis:  Inability to ambulate due to left knee [R26.2] Traumatic hematoma of left knee, initial encounter [S80.02XA] Sprain of right knee, unspecified ligament, initial encounter [S83.91XA] Injury of quadriceps tendon [S76.109A] Patient Active Problem List   Diagnosis Date Noted  . Fall at home, initial encounter 10/17/2018  . Acute traumatic internal derangement of knee, initial encounter 10/17/2018  . Acute traumatic internal derangement of left knee 10/17/2018  . BPH (benign prostatic hyperplasia) 04/09/2016  . GERD (gastroesophageal reflux disease) 08/27/2013  . Arthritis 08/27/2013  . Family  hx of colon cancer 06/26/2013  . Atrial fibrillation ([place]) 06/09/2012  . Brugada syndrome 08/05/2011  . MGUS (monoclonal gammopathy of unknown significance) 08/05/2011  . Hypertension 08/05/2010  . MI 07/24/2008  . Automatic implantable cardioverter-defibrillator in situ 07/24/2008  . Type 2 diabetes mellitus with stage 1 chronic  kidney disease, without long-term current use of insulin ([place]) 02/12/2008  . Coronary atherosclerosis 03/29/2007  . HLD (hyperlipidemia) 02/08/2007  . Cerebral artery occlusion with cerebral infarction ([place]) 02/08/2007  . Seizures ([place]) 02/08/2007   PCP:  Jearld Fenton, NP Pharmacy:   [id number] - [place], [place] [address] [place] [address] Phone: [phone] Fax: [phone]     Social Determinants of Health (SDOH) Interventions    Readmission Risk Interventions No flowsheet data found.

## 2018-10-21 NOTE — Anesthesia Postprocedure Evaluation (Signed)
Anesthesia Post Note  Patient: Brad Brown  Procedure(s) Performed: REPAIR QUADRICEP TENDON (Left Knee)     Patient location during evaluation: PACU Anesthesia Type: General and Regional Level of consciousness: awake and alert Pain management: pain level controlled Vital Signs Assessment: post-procedure vital signs reviewed and stable Respiratory status: spontaneous breathing, nonlabored ventilation, respiratory function stable and patient connected to nasal cannula oxygen Cardiovascular status: blood pressure returned to baseline and stable Postop Assessment: no apparent nausea or vomiting Anesthetic complications: no    Last Vitals:  Vitals:   10/21/18 0014 10/21/18 0423  BP: 123/72 121/69  Pulse: 84 85  Resp: 16 16  Temp: 36.9 C 37.1 C  SpO2: 96% 93%    Last Pain:  Vitals:   10/21/18 0423  TempSrc: Oral  PainSc:                  [place]

## 2018-10-21 NOTE — TOC Progression Note (Signed)
Transition of Care [facility]) - Progression Note    Patient Details  Name: Brad Brown MRN: [id number] Date of Birth: 1946-03-16  Transition of Care [facility]) CM/SW Contact  Loletha Grayer Beverely Pace, RN Phone Number: 10/21/2018, 2:59 PM  Clinical Narrative:       Expected Discharge Plan: [place] Barriers to Discharge: No Barriers Identified  Expected Discharge Plan and Services Expected Discharge Plan: [place]   Discharge Planning Services: CM Consult Post Acute Care Choice: Home Health, Durable Medical Equipment Living arrangements for the past 2 months: Single Family Home Expected Discharge Date: 10/21/18                         HH Arranged: PT [place]: [facility] Date [place]: 10/21/18 Time [place]: [place] Representative spoke with at [place]: [place] ([place]) Interventions    Readmission Risk Interventions No flowsheet data found.

## 2018-10-21 NOTE — Plan of Care (Signed)

## 2018-10-21 NOTE — Progress Notes (Signed)
The AVS discharge instructions were successfully given to the patient. He placed them in his bag. Awaiting his equipment to be delivered and his ride.

## 2018-10-21 NOTE — Progress Notes (Signed)
In to see pt. Pt stated that he feels great. 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 He said he is ready to go home. No noted deficits. MD made aware of pts desire to DC. Continue to monitor.

## 2018-10-24 ENCOUNTER — Telehealth: Payer: Self-pay | Admitting: Internal Medicine

## 2018-10-24 NOTE — Telephone Encounter (Signed)
Patient's Daughter Brad Brown called in regards to the patient ( I did not see her on DPR) Daughter is requesting a call back    She stated that he patient was discharged from the hospital and he is staying with her due to him not being mobile. She stated she is really needing to get the patient into a rehabilitation center to get him moving because she is missing work to stay at home with him .    Zena's C/B # [phone]

## 2018-10-25 ENCOUNTER — Telehealth: Payer: Self-pay

## 2018-10-25 NOTE — Telephone Encounter (Signed)
Patient's daughter called back and said she needs to have patient put in to [facility] as soon as possible. He needs 24 hour care and she needs to go back to work. She works at [employer].

## 2018-10-25 NOTE — Telephone Encounter (Signed)
We can do a virtual visit

## 2018-10-25 NOTE — Telephone Encounter (Addendum)
I spoke with Brad Brown and he gave phone to [facility] to talk with me about scheduling a virtual visit.  I spoke with Zena and she nor her father has a cell phone with camera capability and does not have access to a computer. Brad Brown is not able to do in office visit due to lack of mobility. Zena scheduled a phone visit for 10/26/18 at 3 pm with Avie Echevaria NP.Rica Records said she does not know why they did not admit Brad Brown to rehab facility when d/c from hospital. Brad Brown cannot stay by himself and Rica Records has to go back to work.  FYI to Avie Echevaria NP.

## 2018-10-25 NOTE — Telephone Encounter (Signed)
Transition Care Management Follow-up Telephone Call Please see patient concern section. Thank you  Date discharged? 10/21/2018   How have you been since you were released from the hospital? Patient is not able to ambulate-walk, staying with his daughter. Some discomfort present in the left leg.   Do you understand why you were in the hospital? yes   Do you understand the discharge instructions?yes   Where were you discharged to? Went to [facility].   Items Reviewed:  Medications reviewed: yes  Allergies reviewed: yes  Dietary changes reviewed: yes Referrals reviewed: yes-orthopedic follow up due in 1 month per patietn. Home Health has not contacted patient.  Functional Questionnaire:   Activities of Daily Living (ADLs):   He states they are independent in the following: none States they require assistance with the following: ambulation, fixing food, dressing, bathing, toileting, hygiene.   Any transportation issues/concerns?: yes due to not been able to ambulate.   Any patient concerns? See other message in the chart. Patient wanted to get in to [facility] for rehab. What to do to get this started? Patient is not mobile and needs assistance with all ADLs, therapy, etc.   Confirmed importance and date/time of follow-up visits scheduled not at this time   Confirmed with patient if condition begins to worsen call PCP or go to the ER.  Patient was given the office number and encouraged to call back with question or concerns.  :yes

## 2018-10-25 NOTE — Telephone Encounter (Signed)
Pt left v/m that pt wants to be admitted to [facility]; pts daughter works at [employer]. Pt was injured in fall and d/c from [facility]. Please see TCM note for 10/25/18.pt having a lot of problems mobilizing and pt said he cannot climb steps. D/C note in chart said to see Dr Fredonia Highland ortho and [facility] referral was placed. Would you need to see pt in office or virtual?

## 2018-10-25 NOTE — Telephone Encounter (Signed)
noted 

## 2018-10-26 ENCOUNTER — Encounter: Payer: Self-pay | Admitting: Internal Medicine

## 2018-10-26 ENCOUNTER — Telehealth: Payer: Self-pay | Admitting: Internal Medicine

## 2018-10-26 ENCOUNTER — Ambulatory Visit (INDEPENDENT_AMBULATORY_CARE_PROVIDER_SITE_OTHER): Payer: Medicare Other | Admitting: Internal Medicine

## 2018-10-26 VITALS — BP 140/70 | HR 72

## 2018-10-26 DIAGNOSIS — R269 Unspecified abnormalities of gait and mobility: Secondary | ICD-10-CM | POA: Diagnosis not present

## 2018-10-26 DIAGNOSIS — S76112D Strain of left quadriceps muscle, fascia and tendon, subsequent encounter: Secondary | ICD-10-CM

## 2018-10-26 NOTE — Patient Instructions (Signed)
Quadriceps Tendon Tear  A quadriceps tendon tear or disruption is a partial or complete tear of the tendon between the quadriceps muscles and the kneecap (patella). Tendons connect muscles to bone. The quadriceps muscles are located on the front of the thigh and are primarily used in straightening the knee. With a partial tear, the tendon is overstretched and some of the fibers are frayed. With a complete tear, the quadriceps muscle is detached from the kneecap. This is very rare. What are the causes? This condition can be caused by injury such as:  A deep cut on your thigh that injures the tendon.  Falling on your knee, which may result in breaking your patella. The condition can also occur if you jump and land flat on your foot with your knee bent, causing a quick and forceful tightening (contraction) of your quadriceps. What increases the risk? The following factors may make you more likely to develop this condition:  Participating in: ? Activities that involve jumping, such as basketball. ? Activities in which your knee muscles contract suddenly and forcefully, such as doing jumps or moguls in downhill skiing.  Having a weakened tendon from: ? Long-term (chronic) quadriceps tendinitis. ? Long periods of not moving your knee (immobilization). ? Repeated steroid injections into the quadriceps tendon. ? Medical conditions such as diabetes, lupus, or rheumatoid arthritis. ? Degeneration over time. Most quadriceps tendon tears occur in males over 71 years of age. What are the signs or symptoms? Symptoms of this condition include:  Hearing a popping sound or feeling a tear above your patella at the time of injury.  Pain and tenderness over your thigh. The pain may get worse when you use the quadriceps muscles.  Bruising.  Difficulty walking, or a feeling that your knee may buckle.  A sagging kneecap or an indentation above your kneecap.  Not being able to straighten your knee. How  is this diagnosed? This condition may be diagnosed based on:  Your symptoms and medical history.  A physical exam. During the exam, your health care provider will: ? Feel the area above your kneecap. ? Test the motion and strength of your knee.  Imaging tests to rule out other conditions and to confirm the diagnosis. These may include: ? X-rays to check for a bone injury, such as a fracture. ? An ultrasound or an MRI to look at the muscles and tendons around your knee. How is this treated? This condition may be treated with:  Medicines to help reduce pain and inflammation.  RICE therapy. This includes resting, icing, applying pressure (compression), and raising (elevating) the injured area.  A knee brace (immobilizer) to keep the knee straight while the tendon heals. Typically, the brace will be worn for about 6 weeks.  Crutches to keep weight off of your injured leg.  Physical therapy to improve strength and flexibility. If the injury involves a complete tear of the tendon, surgery is usually needed. Follow these instructions at home: RICE therapy   Rest the injured leg.  If directed, put ice on the injured area. ? If you have a removable knee brace, remove it as told by your health care provider. ? Put ice in a plastic bag. ? Place a towel between your skin and the bag. ? Leave the ice on for 20 minutes, 2-3 times a day.  Apply a compression bandage to the area as told by your health care provider.  Elevate the injured area above the level of your heart while you  are sitting or lying down. If you have a knee brace:  Wear the brace as told by your health care provider. Remove it only as told by your health care provider.  Loosen the brace if your toes tingle, become numb, or turn cold and blue.  Keep the brace clean.  If the brace is not waterproof: ? Do not let it get wet. ? Cover it with a watertight covering when you take a bath or shower.  Ask your health care  provider when it is safe to drive if you have a knee brace. Activity  Do not use the injured limb to support your body weight until your health care provider says that you can. Use crutches as told by your health care provider.  Do exercises as told by your health care provider.  Return to your normal activities as told by your health care provider. Ask your health care provider what activities are safe for you. General instructions  Take over-the-counter and prescription medicines only as told by your health care provider.  Do not use any products that contain nicotine or tobacco, such as cigarettes, e-cigarettes, and chewing tobacco. These can delay healing. If you need help quitting, ask your health care provider.  Keep all follow-up visits as told by your health care provider. This is important. How is this prevented?  Warm up and stretch before being active.  Cool down and stretch after being active.  Give your body time to rest between periods of activity.  Maintain physical fitness, including: ? Strength. ? Flexibility.  Be safe and responsible while being active. This will help you avoid falls. Contact a health care provider if:  Your pain and swelling continue or get worse, even with treatment and rest.  You are unable to walk or stand without your knee feeling like it will buckle. Get help right away if:  You are unable to straighten your knee from a bent position. Summary  A quadriceps tendon tear or disruption is a partial or complete tear of the tendon between the quadriceps muscles and the kneecap.  This condition is caused by injury to the area.  This condition is treated with RICE therapy, physical therapy, medicines, and surgery if the tendon is completely torn.  Do not use the injured limb to support your body weight until your health care provider says that you can. Use crutches as told by your health care provider.  Return to your normal activities as  told by your health care provider. Ask your health care provider what activities are safe for you. This information is not intended to replace advice given to you by your health care provider. Make sure you discuss any questions you have with your health care provider. Document Released: 03/09/2005 Document Revised: 03/08/2018 Document Reviewed: 03/08/2018 Elsevier Patient Education  2020 [employer].

## 2018-10-26 NOTE — Telephone Encounter (Signed)
She needs to get FMLA through her own provider.

## 2018-10-26 NOTE — Telephone Encounter (Signed)
Patient's Daughter Rica Records Called Back  She stated in the virtual visit she forgot to ask if there is some type of note could be wrote stating she is having to take care of her father due to him not being mobile.   Since he is unable to take care of him self and has not been able to get into a rehab facility she has been out of work since Monday to take care of him    C/B # [phone]

## 2018-10-26 NOTE — Telephone Encounter (Signed)
error 

## 2018-10-26 NOTE — Progress Notes (Signed)
Virtual Visit via Telephone Note  I connected with Brad Brown on 10/26/18 at  3:00 PM EDT by telephone and verified that I am speaking with the correct person using two identifiers.  Location: Patient: [employer], retail: Office   I discussed the limitations, risks, security and privacy concerns of performing an evaluation and management service by telephone and the availability of in person appointments. I also discussed with the patient that there may be a patient responsible charge related to this service. The patient expressed understanding and agreed to proceed.   History of Present Illness:  Pt due for ER follow up. Went to the ER 7/27 s/p fall. He was found to have a left quadriceps tendon tear, left knee hematoma and right knee strain.  He was taken to the OR 7/30 for repair of quadriceps tendon. He was discharged on 7/31 with home health, PT/OT. Daughter is requesting that he be transferred to [facility] at this time as he is requiring 24 hour care and she is not able to stay at [facility] with him. He needs help with bathing, dressing, toileting, PT/OT.   Past Medical History:  Diagnosis Date  . Arthritis   . Atrial fibrillation ([place])   . Automatic implantable cardiac defibrillator in situ   . Borderline diabetes mellitus   . CAD (coronary artery disease)   . Colonic polyp 2004   hyperplastic   . COPD (chronic obstructive pulmonary disease) ([place])   . Diabetes mellitus without complication ([place])   . GERD (gastroesophageal reflux disease)   . History of renal insufficiency syndrome   . HTN (hypertension)   . Hyperlipidemia   . Knee derangement 09/2018   LEFT KNEE  . Monoclonal gammopathy   . 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 Seizure disorder ([place])   . Stroke [facility])     Current Outpatient Medications  Medication Sig Dispense Refill  . baclofen (LIORESAL) 10 MG tablet Take 1 tablet (10 mg total) by mouth 2 (two) times daily as needed for  muscle spasms. 20 each 0  . bisacodyl (DULCOLAX) 10 MG suppository Place 1 suppository (10 mg total) rectally daily as needed for moderate constipation. 12 suppository 0  . ciclopirox (LOPROX) 0.77 % cream Apply 1 application topically 2 (two) times daily as needed (for rash).     . Clotrimazole 1 % OINT Apply 1 application topically daily. (Patient taking differently: Apply 1 application topically daily as needed (for rash). ) 30 g 0  . fluticasone (FLOVENT HFA) 110 MCG/ACT inhaler Inhale 1 puff into the lungs 2 (two) times daily. 1 Inhaler 12  . glipiZIDE (GLUCOTROL) 5 MG tablet Take 1 tablet (5 mg total) by mouth 2 (two) times daily before a meal. MUST SCHEDULE ANNUAL EXAM 180 tablet 0  . HYDROcodone-acetaminophen (NORCO) 5-325 MG tablet Take 1-2 tablets by mouth every 6 (six) hours as needed for severe pain (Take tylenol for mild and moderate pain). 30 tablet 0  . levETIRAcetam (KEPPRA) 500 MG tablet Take 500-1,000 mg by mouth See admin instructions. Take one tablet by mouth in the morning and two tablets by mouth at night    . losartan (COZAAR) 100 MG tablet TAKE 1 TABLET BY MOUTH EVERY DAY (Patient taking differently: Take 100 mg by mouth daily. ) 30 tablet 5  . metoprolol tartrate (LOPRESSOR) 25 MG tablet Take 12.5 mg by mouth 2 (two) times daily.    . Multiple Vitamin (MULTIVITAMIN) tablet Take 1 tablet by mouth daily.      Marland Kitchen  naftifine (NAFTIN) 1 % cream Apply 1 application topically 2 (two) times daily as needed (For  infection between toes).     . Niacin, Antihyperlipidemic, 500 MG TABS Take 500 mg by mouth daily. 30 tablet 2  . NIFEdipine (PROCARDIA-XL/ADALAT CC) 30 MG 24 hr tablet Take 30 mg by mouth daily.    . Omega-3 Fatty Acids (FISH OIL) 1200 MG CAPS Take 1,200 mg by mouth daily.     . ondansetron (ZOFRAN) 4 MG tablet Take 1 tablet (4 mg total) by mouth every 6 (six) hours as needed for nausea. 20 tablet 0  . polyethylene glycol (MIRALAX / GLYCOLAX) 17 g packet Take 17 g by mouth 2  (two) times daily. 14 each 0  . Rivaroxaban (XARELTO) 20 MG TABS tablet Take 20 mg by mouth daily.     Marland Kitchen senna (SENOKOT) 8.6 MG TABS tablet Take 1 tablet (8.6 mg total) by mouth 2 (two) times daily. 120 tablet 0  . simvastatin (ZOCOR) 20 MG tablet Take 10 mg by mouth at bedtime.     . SYMBICORT 160-4.5 MCG/ACT inhaler INHALE 2 PUFFS INTO THE LUNGS 2 (TWO) TIMES DAILY. 10 Inhaler 0  . tamsulosin (FLOMAX) 0.4 MG CAPS capsule Take 0.4 mg by mouth daily.  5   No current facility-administered medications for this visit.     Allergies  Allergen Reactions  . Lipitor [Atorvastatin] Rash    Family History  Problem Relation Age of Onset  . Colon cancer Brother   . Heart attack Mother   . Heart attack Brother     Social History   Socioeconomic History  . Marital status: Single    Spouse name: Not on file  . Number of children: 1  . Years of education: Not on file  . Highest education level: Not on file  Occupational History  . Occupation: retired    Fish farm manager: RETIRED  Social Needs  . Financial resource strain: Not on file  . Food insecurity    Worry: Not on file    Inability: Not on file  . Transportation needs    Medical: Not on file    Non-medical: Not on file  Tobacco Use  . Smoking status: Former Smoker    Packs/day: 0.50    Years: 15.00    Pack years: 7.50    Types: Cigarettes    Quit date: 03/23/2004    Years since quitting: 14.6  . Smokeless tobacco: Never Used  Substance and Sexual Activity  . Alcohol use: Yes    Alcohol/week: 1.0 standard drinks    Types: 1 Glasses of wine per week    Comment: rare--wine  . Drug use: No  . Sexual activity: Not on file  Lifestyle  . Physical activity    Days per week: Not on file    Minutes per session: Not on file  . Stress: Not on file  Relationships  . Social Herbalist on phone: Not on file    Gets together: Not on file    Attends religious service: Not on file    Active member of club or organization: Not  on file    Attends meetings of clubs or organizations: Not on file    Relationship status: Not on file  . Intimate partner violence    Fear of current or ex partner: Not on file    Emotionally abused: Not on file    Physically abused: Not on file    Forced sexual activity: Not on  file  Other Topics Concern  . Not on file  Social History Narrative   ICD-St. Jude  Remote-Yes     Constitutional: Pt reports fatigue. Denies fever, malaise, headache or abrupt weight changes.  Respiratory: Denies difficulty breathing, shortness of breath, cough or sputum production.   Cardiovascular: Denies chest pain, chest tightness, palpitations or swelling in the hands or feet.  Gastrointestinal: Pt reports intermittent constipation. Denies abdominal pain, bloating, diarrhea or blood in the stool.  GU: Pt reports urinary frequency. Denies urgency, pain with urination, burning sensation, blood in urine, odor or discharge. Musculoskeletal: Pt reports left leg pain, difficulty with gait. Denies decrease in range of motion,  or joint pain and swelling.  Skin: Denies redness, rashes, lesions or ulcercations.  Neurological: Pt has trouble with recall. Denies dizziness,  difficulty with speech or problems with balance and coordination.  Psych: Denies anxiety, depression, SI/HI.  No other specific complaints in a complete review of systems (except as listed in HPI above).  Observations/Objective:  Wt Readings from Last 3 Encounters:  10/20/18 192 lb 12.8 oz (87.5 kg)  09/21/18 196 lb 8 oz (89.1 kg)  09/13/18 194 lb (88 kg)    Neurological: Alert and oriented.    BMET    Component Value Date/Time   NA 135 10/21/2018 0609   NA 140 01/30/2016 1129   K 4.5 10/21/2018 0609   K 4.4 01/30/2016 1129   CL 102 10/21/2018 0609   CL 104 01/06/2012 1355   CO2 24 10/21/2018 0609   CO2 26 01/30/2016 1129   GLUCOSE 148 (H) 10/21/2018 0609   GLUCOSE 191 (H) 01/30/2016 1129   GLUCOSE 77 01/06/2012 1355   BUN  19 10/21/2018 0609   BUN 15.0 01/30/2016 1129   CREATININE 1.52 (H) 10/21/2018 0609   CREATININE 1.29 (H) 02/17/2016 1011   CREATININE 1.4 (H) 01/30/2016 1129   CALCIUM 8.5 (L) 10/21/2018 0609   CALCIUM 9.4 01/30/2016 1129   GFRNONAA 46 (L) 10/21/2018 0609   GFRAA 53 (L) 10/21/2018 0609    Lipid Panel     Component Value Date/Time   CHOL 124 04/21/2018 0942   TRIG 161.0 (H) 04/21/2018 0942   HDL 44.60 04/21/2018 0942   CHOLHDL 3 04/21/2018 0942   VLDL 32.2 04/21/2018 0942   LDLCALC 47 04/21/2018 0942    CBC    Component Value Date/Time   WBC 9.3 10/21/2018 0609   RBC 3.40 (L) 10/21/2018 0609   HGB 10.5 (L) 10/21/2018 0609   HGB 13.5 01/29/2017 0948   HCT 31.4 (L) 10/21/2018 0609   HCT 40.9 01/29/2017 0948   PLT 162 10/21/2018 0609   PLT 187 01/29/2017 0948   MCV 92.4 10/21/2018 0609   MCV 91.7 01/29/2017 0948   MCH 30.9 10/21/2018 0609   MCHC 33.4 10/21/2018 0609   RDW 12.2 10/21/2018 0609   RDW 12.5 01/29/2017 0948   LYMPHSABS 1.0 10/21/2018 0609   LYMPHSABS 1.1 01/29/2017 0948   MONOABS 0.8 10/21/2018 0609   MONOABS 0.2 01/29/2017 0948   EOSABS 0.0 10/21/2018 0609   EOSABS 0.1 01/29/2017 0948   BASOSABS 0.0 10/21/2018 0609   BASOSABS 0.0 01/29/2017 0948    Hgb A1C Lab Results  Component Value Date   HGBA1C 6.1 04/21/2018       Assessment and Plan:   Hospital Follow up for Quadriceps Tendon Tear, Difficulty with Gait:  Hospital notes, labs and imaging reviewed Reviewed medications with pt and daughter, will continue FL2 form filled out and faxed to  [facility] per daughters request Will await placement  Return precautions discussed  Follow Up Instructions:    I discussed the assessment and treatment plan with the patient. The patient was provided an opportunity to ask questions and all were answered. The patient agreed with the plan and demonstrated an understanding of the instructions.   The patient was advised to call back or seek an  in-person evaluation if the symptoms worsen or if the condition fails to improve as anticipated.  I provided 14 minutes of non-face-to-face time during this encounter.   Webb Silversmith, NP

## 2018-10-28 ENCOUNTER — Telehealth: Payer: Self-pay | Admitting: Internal Medicine

## 2018-10-28 NOTE — Telephone Encounter (Signed)
She does not need an order. She can take pt to drive up test site at [facility] or [place]. Test can take up to 1 week to come back.

## 2018-10-28 NOTE — Telephone Encounter (Signed)
Patient l/m on voicemail checking on this. Asked to please call him back to discuss.

## 2018-10-28 NOTE — Telephone Encounter (Signed)
Patient's Daughter [employer] called in regards to patient having COVID test done., She stated he is needing this before he can go into the rehab facility.  She stated at his visit a few days ago she did not realize he would need this in order to be admitted there. Daughter would like it done as soon as a possible so she can get him in there.    C/b# [phone]

## 2018-10-29 DIAGNOSIS — I4891 Unspecified atrial fibrillation: Secondary | ICD-10-CM | POA: Diagnosis not present

## 2018-10-29 DIAGNOSIS — S76102D Unspecified injury of left quadriceps muscle, fascia and tendon, subsequent encounter: Secondary | ICD-10-CM | POA: Diagnosis not present

## 2018-10-29 DIAGNOSIS — R419 Unspecified symptoms and signs involving cognitive functions and awareness: Secondary | ICD-10-CM | POA: Diagnosis not present

## 2018-10-29 DIAGNOSIS — I10 Essential (primary) hypertension: Secondary | ICD-10-CM | POA: Diagnosis not present

## 2018-10-29 DIAGNOSIS — R569 Unspecified convulsions: Secondary | ICD-10-CM | POA: Diagnosis not present

## 2018-10-29 DIAGNOSIS — R58 Hemorrhage, not elsewhere classified: Secondary | ICD-10-CM | POA: Diagnosis not present

## 2018-10-29 DIAGNOSIS — E119 Type 2 diabetes mellitus without complications: Secondary | ICD-10-CM | POA: Diagnosis not present

## 2018-10-29 DIAGNOSIS — S76112D Strain of left quadriceps muscle, fascia and tendon, subsequent encounter: Secondary | ICD-10-CM | POA: Diagnosis not present

## 2018-10-29 DIAGNOSIS — I498 Other specified cardiac arrhythmias: Secondary | ICD-10-CM | POA: Diagnosis not present

## 2018-10-29 DIAGNOSIS — M6281 Muscle weakness (generalized): Secondary | ICD-10-CM | POA: Diagnosis not present

## 2018-10-29 DIAGNOSIS — Z471 Aftercare following joint replacement surgery: Secondary | ICD-10-CM | POA: Diagnosis not present

## 2018-10-29 DIAGNOSIS — R488 Other symbolic dysfunctions: Secondary | ICD-10-CM | POA: Diagnosis not present

## 2018-10-29 DIAGNOSIS — K5900 Constipation, unspecified: Secondary | ICD-10-CM | POA: Diagnosis not present

## 2018-10-29 DIAGNOSIS — S76109D Unspecified injury of unspecified quadriceps muscle, fascia and tendon, subsequent encounter: Secondary | ICD-10-CM | POA: Diagnosis not present

## 2018-10-29 DIAGNOSIS — I6350 Cerebral infarction due to unspecified occlusion or stenosis of unspecified cerebral artery: Secondary | ICD-10-CM | POA: Diagnosis not present

## 2018-10-29 DIAGNOSIS — R2689 Other abnormalities of gait and mobility: Secondary | ICD-10-CM | POA: Diagnosis not present

## 2018-10-31 DIAGNOSIS — E119 Type 2 diabetes mellitus without complications: Secondary | ICD-10-CM | POA: Diagnosis not present

## 2018-10-31 DIAGNOSIS — S76112D Strain of left quadriceps muscle, fascia and tendon, subsequent encounter: Secondary | ICD-10-CM | POA: Diagnosis not present

## 2018-10-31 DIAGNOSIS — K5900 Constipation, unspecified: Secondary | ICD-10-CM | POA: Diagnosis not present

## 2018-10-31 DIAGNOSIS — R58 Hemorrhage, not elsewhere classified: Secondary | ICD-10-CM | POA: Diagnosis not present

## 2018-11-02 ENCOUNTER — Ambulatory Visit (INDEPENDENT_AMBULATORY_CARE_PROVIDER_SITE_OTHER): Payer: Medicare Other | Admitting: Neurology

## 2018-11-02 ENCOUNTER — Other Ambulatory Visit: Payer: Self-pay

## 2018-11-02 ENCOUNTER — Encounter: Payer: Self-pay | Admitting: Neurology

## 2018-11-02 VITALS — BP 120/60 | HR 76 | Temp 97.8°F | Ht 69.0 in | Wt 195.0 lb

## 2018-11-02 DIAGNOSIS — R569 Unspecified convulsions: Secondary | ICD-10-CM

## 2018-11-02 DIAGNOSIS — K5900 Constipation, unspecified: Secondary | ICD-10-CM | POA: Diagnosis not present

## 2018-11-02 DIAGNOSIS — R419 Unspecified symptoms and signs involving cognitive functions and awareness: Secondary | ICD-10-CM | POA: Diagnosis not present

## 2018-11-02 DIAGNOSIS — I10 Essential (primary) hypertension: Secondary | ICD-10-CM | POA: Diagnosis not present

## 2018-11-02 DIAGNOSIS — I4891 Unspecified atrial fibrillation: Secondary | ICD-10-CM | POA: Diagnosis not present

## 2018-11-02 DIAGNOSIS — S76112D Strain of left quadriceps muscle, fascia and tendon, subsequent encounter: Secondary | ICD-10-CM | POA: Diagnosis not present

## 2018-11-02 NOTE — Patient Instructions (Signed)
Continue Keppra  Levetiracetam tablets What is this medicine? LEVETIRACETAM (lee ve tye RA se tam) is an antiepileptic drug. It is used with other medicines to treat certain types of seizures. This medicine may be used for other purposes; ask your health care provider or pharmacist if you have questions. COMMON BRAND NAME(S): Keppra, Roweepra What should I tell my health care provider before I take this medicine? They need to know if you have any of these conditions:  kidney disease  suicidal thoughts, plans, or attempt; a previous suicide attempt by you or a family member  an unusual or allergic reaction to levetiracetam, other medicines, foods, dyes, or preservatives  pregnant or trying to get pregnant  breast-feeding How should I use this medicine? Take this medicine by mouth with a glass of water. Follow the directions on the prescription label. Swallow the tablets whole. Do not crush or chew this medicine. You may take this medicine with or without food. Take your doses at regular intervals. Do not take your medicine more often than directed. Do not stop taking this medicine or any of your seizure medicines unless instructed by your doctor or health care professional. Stopping your medicine suddenly can increase your seizures or their severity. A special MedGuide will be given to you by the pharmacist with each prescription and refill. Be sure to read this information carefully each time. Contact your pediatrician or health care professional regarding the use of this medication in children. While this drug may be prescribed for children as young as 97 years of age for selected conditions, precautions do apply. Overdosage: If you think you have taken too much of this medicine contact a poison control center or emergency room at once. NOTE: This medicine is only for you. Do not share this medicine with others. What if I miss a dose? If you miss a dose, take it as soon as you can. If it is  almost time for your next dose, take only that dose. Do not take double or extra doses. What may interact with this medicine? This medicine may interact with the following medications:  carbamazepine  colesevelam  probenecid  sevelamer This list may not describe all possible interactions. Give your health care provider a list of all the medicines, herbs, non-prescription drugs, or dietary supplements you use. Also tell them if you smoke, drink alcohol, or use illegal drugs. Some items may interact with your medicine. What should I watch for while using this medicine? Visit your doctor or health care provider for a regular check on your progress. Wear a medical identification bracelet or chain to say you have epilepsy, and carry a card that lists all your medications. This medicine may cause serious skin reactions. They can happen weeks to months after starting the medicine. Contact your health care provider right away if you notice fevers or flu-like symptoms with a rash. The rash may be red or purple and then turn into blisters or peeling of the skin. Or, you might notice a red rash with swelling of the face, lips or lymph nodes in your neck or under your arms. It is important to take this medicine exactly as instructed by your health care provider. When first starting treatment, your dose may need to be adjusted. It may take weeks or months before your dose is stable. You should contact your doctor or health care provider if your seizures get worse or if you have any new types of seizures. You may get drowsy or dizzy.  Do not drive, use machinery, or do anything that needs mental alertness until you know how this medicine affects you. Do not stand or sit up quickly, especially if you are an older patient. This reduces the risk of dizzy or fainting spells. Alcohol may interfere with the effect of this medicine. Avoid alcoholic drinks. The use of this medicine may increase the chance of suicidal  thoughts or actions. Pay special attention to how you are responding while on this medicine. Any worsening of mood, or thoughts of suicide or dying should be reported to your health care provider right away. Women who become pregnant while using this medicine may enroll in the [place] Pregnancy Registry by calling ([phone] [phone]. This registry collects information about the safety of antiepileptic drug use during pregnancy. What side effects may I notice from receiving this medicine? Side effects that you should report to your doctor or health care professional as soon as possible:  allergic reactions like skin rash, itching or hives, swelling of the face, lips, or tongue  breathing problems  dark urine  general ill feeling or flu-like symptoms  problems with balance, talking, walking  rash, fever, and swollen lymph nodes  redness, blistering, peeling or loosening of the skin, including inside the mouth  unusually weak or tired  worsening of mood, thoughts or actions of suicide or dying  yellowing of the eyes or skin Side effects that usually do not require medical attention (report to your doctor or health care professional if they continue or are bothersome):  diarrhea  dizzy, drowsy  headache  loss of appetite This list may not describe all possible side effects. Call your doctor for medical advice about side effects. You may report side effects to FDA at 1-800-FDA-1088. Where should I keep my medicine? Keep out of reach of children. Store at room temperature between 15 and 30 degrees C (59 and 86 degrees F). Throw away any unused medicine after the expiration date. NOTE: This sheet is a summary. It may not cover all possible information. If you have questions about this medicine, talk to your doctor, pharmacist, or health care provider.  2020 Elsevier/Gold Standard (2018-06-10 15:23:36)

## 2018-11-02 NOTE — Progress Notes (Signed)
[facility]    Provider:  Dr Jaynee Eagles Requesting Provider: Jearld Fenton, NP Primary Care Provider:  Jearld Fenton, NP  CC:  Prior seizures  HPI:  Brad Brown is a 73 y.o. male here from [facility].  He was seen in the hospital recently for a fall and was recommended that he follow-up here for his seizure history.  He has a past medical history of controlled seizures, stroke, hyperlipidemia, hypertension, fall, diabetes, COPD, chronic kidney disease, coronary artery disease, cardiac defibrillator in situ, A. fib, arthritis.  Patient was in the hospital and had a moment of confusion, it was brief and it was postop while he was on multiple medications, the stroke code was called and then canceled, he was evaluated by neurology who did not feel this was a stroke or seizure.  He was asked to follow-up outpatient.    He has a history of well controlled seizures on Keppra and hasn't had a seizure n years, recent mechanical fall not associated with seizures.II have no history I don;t even know who referred but patient says he has a history of seizures. He does not know why he is here. He says he was in the hospital and said he has a seizure. He has had seizures in the past like 10 years ago. Years ago. He possibly saw Dr. Leonie Man we will try to find those notes and he has been on medications every since the keppra. He had a fall and he lost his balance and he tumbled down the hill and he ruptured his knee area and they questioned his Keppra but he has not had a seizure in years and is well controlled on the [place]. He is doing well. He denies any recent seizures this fall had nothing to do with seizures. Has had 3 seizures, consist of brief numbness and slurring of speech, lasted 10 minutes, no comfusion or urination on himself. He had an episode in the hospital of confusion but he was post surgery and it was felt medication induced. No workup was recommended by neurology inpatient.    Reviewed notes, labs and imaging from outside physicians, which showed:  I reviewed MRI of the brain report 2007 which showed an infarct in the left frontal lobe involving the cortex and white matter, a smaller acute infarct in the left posterior temporal lobe in the left MCA distribution, small chronic infarcts in the inferior cerebellum bilaterally.  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  I reviewed labs from July and August 2020, B12 400, CMP with elevated glucose and elevated creatinine, CBC with anemia  I reviewed the hospital discharge summary July 2020, patient presented with left knee pain and swelling after falling downhill mowing his lawn and landing on his left leg, admitted for left quadriceps tendon rupture and surgery.  Patient did well postop however rapid response was called because he became altered, neurology was called and neurologist felt symptoms resolved quickly and did not think that this was a stroke or a seizure, considering pain medications and postop status this is likely the cause.  No neurologic deficits and back to baseline.  Review of Systems: Patient complains of symptoms per HPI as well as the following symptoms: Quadriceps tear status post surgery. Pertinent negatives and positives per HPI. All others negative.   Social History   Socioeconomic History   Marital  status: Single    Spouse name: Not on file   Number of children: 1   Years of education: Not on file   Highest education level: Not on file  Occupational History   Occupation: retired    Fish farm manager: RETIRED  Scientist, product/process development strain: Not on file   Food insecurity    Worry: Not on file    Inability: Not on file   Transportation needs    Medical: Not on file    Non-medical: Not on file  Tobacco Use   Smoking status: Former Smoker      Packs/day: 0.50    Years: 15.00    Pack years: 7.50    Types: Cigarettes    Quit date: 03/23/2004    Years since quitting: 14.6   Smokeless tobacco: Never Used  Substance and Sexual Activity   Alcohol use: Yes    Alcohol/week: 1.0 standard drinks    Types: 1 Glasses of wine per week    Comment: rare--wine   Drug use: No   Sexual activity: Not on file  Lifestyle   Physical activity    Days per week: Not on file    Minutes per session: Not on file   Stress: Not on file  Relationships   Social connections    Talks on phone: Not on file    Gets together: Not on file    Attends religious service: Not on file    Active member of club or organization: Not on file    Attends meetings of clubs or organizations: Not on file    Relationship status: Not on file   Intimate partner violence    Fear of current or ex partner: Not on file    Emotionally abused: Not on file    Physically abused: Not on file    Forced sexual activity: Not on file  Other Topics Concern   Not on file  Social History Narrative   ICD-St. Jude  Remote-Yes      Live currently @ [facility] for rehabilitation. Plans to discharge to his daughter's house for continuation of rehab.    Family History  Problem Relation Age of Onset   Colon cancer Brother    Heart attack Mother    Heart attack Brother    Seizures Neg Hx     Past Medical History:  Diagnosis Date   Acute myocardial infarction [facility]) 06/1983   Arthritis    Atrial fibrillation ([place])    Automatic implantable cardiac defibrillator in situ    Borderline diabetes mellitus    BPH without obstruction/lower urinary tract symptoms    CAD (coronary artery disease)    Chronic kidney disease, stage 1    Colonic polyp 2004   hyperplastic    COPD (chronic obstructive pulmonary disease) ([place])    Diabetes mellitus without complication ([place])    Fall    GERD (gastroesophageal reflux disease)    History of renal insufficiency  syndrome    HTN (hypertension)    Hyperlipidemia    Knee derangement 09/2018   LEFT KNEE   Monoclonal gammopathy    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)    Seizure disorder ([place])    Stroke [facility])    pt reports he has had 3    Patient Active Problem List   Diagnosis Date Noted   BPH (benign prostatic hyperplasia) 04/09/2016   GERD (gastroesophageal reflux disease) 08/27/2013   Arthritis 08/27/2013   Family hx of colon cancer 06/26/2013  Atrial fibrillation ([place]) 06/09/2012   Brugada syndrome 08/05/2011   MGUS (monoclonal gammopathy of unknown significance) 08/05/2011   Hypertension 08/05/2010   MI 07/24/2008   Automatic implantable cardioverter-defibrillator in situ 07/24/2008   Type 2 diabetes mellitus with stage 1 chronic kidney disease, without long-term current use of insulin ([place]) 02/12/2008   Coronary atherosclerosis 03/29/2007   HLD (hyperlipidemia) 02/08/2007   Cerebral artery occlusion with cerebral infarction ([place]) 02/08/2007   Seizures ([place]) 02/08/2007    Past Surgical History:  Procedure Laterality Date   CARDIAC CATHETERIZATION  1987   Showed distal left circumflex 100% occluded    CARDIAC DEFIBRILLATOR PLACEMENT  08/17/2006   Implantation of a St. Jude single chamber defibrillator   EP IMPLANTABLE DEVICE N/A 02/21/2016   Procedure: ICD Generator Changeout;  Surgeon: Evans Lance, MD;  Location: [facility];  Service: Cardiovascular;  Laterality: N/A;   INGUINAL HERNIA REPAIR Left    QUADRICEPS TENDON REPAIR Left 10/20/2018   Procedure: REPAIR QUADRICEP TENDON;  Surgeon: Renette Butters, MD;  Location: [facility];  Service: Orthopedics;  Laterality: Left;    Current Outpatient Medications  Medication Sig Dispense Refill   acetaminophen (TYLENOL) 325 MG tablet Take 650 mg by mouth 4 (four) times daily as needed.     baclofen (LIORESAL) 10 MG tablet Take 1 tablet (10 mg total) by mouth 2 (two) times daily as  needed for muscle spasms. 20 each 0   bisacodyl (DULCOLAX) 10 MG suppository Place 1 suppository (10 mg total) rectally daily as needed for moderate constipation. 12 suppository 0   ciclopirox (LOPROX) 0.77 % cream Apply 1 application topically 2 (two) times daily as needed (for rash).      fluticasone (FLOVENT HFA) 110 MCG/ACT inhaler Inhale 1 puff into the lungs 2 (two) times daily. 1 Inhaler 12   glipiZIDE (GLUCOTROL) 5 MG tablet Take 1 tablet (5 mg total) by mouth 2 (two) times daily before a meal. MUST SCHEDULE ANNUAL EXAM 180 tablet 0   HYDROcodone-acetaminophen (NORCO) 5-325 MG tablet Take 1-2 tablets by mouth every 6 (six) hours as needed for severe pain (Take tylenol for mild and moderate pain). (Patient taking differently: Take 1 tablet by mouth 4 (four) times daily as needed for severe pain (Take tylenol for mild and moderate pain). ) 30 tablet 0   lactulose (CHRONULAC) 10 GM/15ML solution Take 20 g by mouth 2 (two) times daily as needed for mild constipation.     levETIRAcetam (KEPPRA) 500 MG tablet Take 500-1,000 mg by mouth See admin instructions. Take one tablet by mouth in the morning and two tablets by mouth at night     losartan (COZAAR) 100 MG tablet TAKE 1 TABLET BY MOUTH EVERY DAY (Patient taking differently: Take 100 mg by mouth daily. ) 30 tablet 5   metoprolol tartrate (LOPRESSOR) 25 MG tablet Take 12.5 mg by mouth 2 (two) times daily.     Multiple Vitamin (MULTIVITAMIN) tablet Take 1 tablet by mouth daily.       naftifine (NAFTIN) 1 % cream Apply 1 application topically 2 (two) times daily as needed (For  infection between toes).      niacin 500 MG tablet Take 500 mg by mouth daily.     Niacin, Antihyperlipidemic, 500 MG TABS Take 500 mg by mouth daily. 30 tablet 2   NIFEdipine (PROCARDIA-XL/ADALAT CC) 30 MG 24 hr tablet Take 30 mg by mouth daily.     Omega-3 Fatty Acids (FISH OIL) 1200 MG CAPS Take 1,200  mg by mouth daily.      ondansetron (ZOFRAN) 4 MG  tablet Take 1 tablet (4 mg total) by mouth every 6 (six) hours as needed for nausea. 20 tablet 0   Rivaroxaban (XARELTO) 20 MG TABS tablet Take 20 mg by mouth daily.      senna (SENOKOT) 8.6 MG TABS tablet Take 1 tablet (8.6 mg total) by mouth 2 (two) times daily. 120 tablet 0   simvastatin (ZOCOR) 20 MG tablet Take 10 mg by mouth at bedtime.      SYMBICORT 160-4.5 MCG/ACT inhaler INHALE 2 PUFFS INTO THE LUNGS 2 (TWO) TIMES DAILY. 10 Inhaler 0   tamsulosin (FLOMAX) 0.4 MG CAPS capsule Take 0.4 mg by mouth daily.  5   Clotrimazole 1 % OINT Apply 1 application topically daily. (Patient taking differently: Apply 1 application topically daily as needed (for rash). ) 30 g 0   polyethylene glycol (MIRALAX / GLYCOLAX) 17 g packet Take 17 g by mouth 2 (two) times daily. 14 each 0   No current facility-administered medications for this visit.     Allergies as of 11/02/2018 - Review Complete 11/02/2018  Allergen Reaction Noted   Lipitor [atorvastatin] Rash 02/06/2016    Vitals: BP 120/60 (BP Location: Right Arm, Patient Position: Sitting)    Pulse 76    Temp 97.8 F (36.6 C)    Ht 5\' 9"  (1.753 m)    Wt 195 lb (88.5 kg)    BMI 28.80 kg/m  Last Weight:  Wt Readings from Last 1 Encounters:  11/02/18 195 lb (88.5 kg)   Last Height:   Ht Readings from Last 1 Encounters:  11/02/18 5\' 9"  (1.753 m)     Physical exam: Exam: Gen: NAD, conversant                 CV: RRR, no MRG. No Carotid Bruits. No peripheral edema, warm, nontender Eyes: Conjunctivae clear without exudates or hemorrhage  Neuro: Detailed Neurologic Exam  Speech:    Speech is normal; fluent and spontaneous with normal comprehension.  Cognition:    The patient is oriented to person, place, and time;     recent and remote memory intact;     language fluent;     normal attention, concentration,     fund of knowledge Cranial Nerves:    The pupils are equal, round, and reactive to light.  Attempted for endoscopy could  not visualize visual fields are full to finger confrontation. Extraocular movements are intact. Trigeminal sensation is intact and the muscles of mastication are normal. The face is symmetric. The palate elevates in the midline. Hearing intact. Voice is normal. Shoulder shrug is normal. The tongue has normal motion without fasciculations.   Coordination:    No dysmetria  Gait:    Patient has a brace on his full leg on the left side making it difficult for him to ambulate, he can get up out of the wheelchair on his own and transfer to the bathroom, take a few steps on his own with assistance.  Difficult to assess gait at this time.  Motor Observation:    No asymmetry, no atrophy, and no involuntary movements noted. Tone:    Normal muscle tone.    Posture:    Posture is normal. normal erect    Strength: Could not test the leg with his cast however otherwise strength is V/V in the upper and lower limbs.      Sensation: intact to LT  Reflex Exam:  DTR's:    Absent AJ's, could not test the lower left extremity due to a large leg brace, upper symmetrical Toes:    The toes are equivocal bilaterally.   Clonus:    Clonus is absent.    Assessment/Plan:  73 y.o. male here from [facility].  He was seen in the hospital recently for a fall and was recommended that he follow-up here for his seizure history.  He has a past medical history of controlled seizures, stroke, hyperlipidemia, hypertension, fall, diabetes, COPD, chronic kidney disease, coronary artery disease, cardiac defibrillator in situ, A. fib, arthritis.  Patient was in the hospital and had a moment of confusion, it was brief and it was postop while he was on multiple medications, the stroke code was called and then canceled, he was evaluated by neurology who did not feel this was a stroke or seizure.  He was asked to follow-up outpatient.  I had a long d/w patient about prior stroke, risk for recurrent stroke/TIAs, personally  independently reviewed imaging studies and stroke evaluation results and answered questions.Continue Xarelto for secondary stroke prevention and maintain strict control of hypertension with blood pressure goal below 130/90, diabetes with hemoglobin A1c goal below 6.5% and lipids with LDL cholesterol goal below 70 mg/dL.Recommend she see her primary MD to get prescription. I also advised the patient to eat a healthy diet with plenty of whole grains, cereals, fruits and vegetables, exercise regularly and maintain ideal body weight .   He had an episode in the hospital of confusion but he was post surgery and it was felt pain medication induced. No workup was recommended by neurology inpatient. I offered patient workup, MRI brain however its doubtful this was stroke and he defers any workup at this time. Continue Xarelto. Manage vascular risk factors with primary care  Continue Keppra for seizures.  A total of 60 minutes was spent face-to-face with this patient. Over half this time was spent on counseling patient on the  1. Seizures ([place])   2. Alteration of awareness    diagnosis and different diagnostic and therapeutic options, counseling and coordination of care, risks ans benefits of management, compliance, or risk factor reduction and education.      Cc: Jearld Fenton, NP,    Sarina Ill, MD  [facility] [address] [place], [place] [address]  Phone [phone] Fax [phone]

## 2018-11-04 DIAGNOSIS — E119 Type 2 diabetes mellitus without complications: Secondary | ICD-10-CM | POA: Diagnosis not present

## 2018-11-04 DIAGNOSIS — I498 Other specified cardiac arrhythmias: Secondary | ICD-10-CM | POA: Diagnosis not present

## 2018-11-04 DIAGNOSIS — I4891 Unspecified atrial fibrillation: Secondary | ICD-10-CM | POA: Diagnosis not present

## 2018-11-04 DIAGNOSIS — S76112D Strain of left quadriceps muscle, fascia and tendon, subsequent encounter: Secondary | ICD-10-CM | POA: Diagnosis not present

## 2018-11-06 DIAGNOSIS — I498 Other specified cardiac arrhythmias: Secondary | ICD-10-CM | POA: Diagnosis not present

## 2018-11-06 DIAGNOSIS — D631 Anemia in chronic kidney disease: Secondary | ICD-10-CM | POA: Diagnosis not present

## 2018-11-06 DIAGNOSIS — M23602 Other spontaneous disruption of unspecified ligament of left knee: Secondary | ICD-10-CM | POA: Diagnosis not present

## 2018-11-06 DIAGNOSIS — N183 Chronic kidney disease, stage 3 (moderate): Secondary | ICD-10-CM | POA: Diagnosis not present

## 2018-11-06 DIAGNOSIS — E785 Hyperlipidemia, unspecified: Secondary | ICD-10-CM | POA: Diagnosis not present

## 2018-11-06 DIAGNOSIS — I2510 Atherosclerotic heart disease of native coronary artery without angina pectoris: Secondary | ICD-10-CM | POA: Diagnosis not present

## 2018-11-06 DIAGNOSIS — D472 Monoclonal gammopathy: Secondary | ICD-10-CM | POA: Diagnosis not present

## 2018-11-06 DIAGNOSIS — J449 Chronic obstructive pulmonary disease, unspecified: Secondary | ICD-10-CM | POA: Diagnosis not present

## 2018-11-06 DIAGNOSIS — S86812D Strain of other muscle(s) and tendon(s) at lower leg level, left leg, subsequent encounter: Secondary | ICD-10-CM | POA: Diagnosis not present

## 2018-11-06 DIAGNOSIS — E1122 Type 2 diabetes mellitus with diabetic chronic kidney disease: Secondary | ICD-10-CM | POA: Diagnosis not present

## 2018-11-06 DIAGNOSIS — I4891 Unspecified atrial fibrillation: Secondary | ICD-10-CM | POA: Diagnosis not present

## 2018-11-06 DIAGNOSIS — I129 Hypertensive chronic kidney disease with stage 1 through stage 4 chronic kidney disease, or unspecified chronic kidney disease: Secondary | ICD-10-CM | POA: Diagnosis not present

## 2018-11-08 DIAGNOSIS — E785 Hyperlipidemia, unspecified: Secondary | ICD-10-CM | POA: Diagnosis not present

## 2018-11-08 DIAGNOSIS — I498 Other specified cardiac arrhythmias: Secondary | ICD-10-CM | POA: Diagnosis not present

## 2018-11-08 DIAGNOSIS — I4891 Unspecified atrial fibrillation: Secondary | ICD-10-CM | POA: Diagnosis not present

## 2018-11-08 DIAGNOSIS — E1122 Type 2 diabetes mellitus with diabetic chronic kidney disease: Secondary | ICD-10-CM | POA: Diagnosis not present

## 2018-11-08 DIAGNOSIS — N183 Chronic kidney disease, stage 3 (moderate): Secondary | ICD-10-CM | POA: Diagnosis not present

## 2018-11-08 DIAGNOSIS — J449 Chronic obstructive pulmonary disease, unspecified: Secondary | ICD-10-CM | POA: Diagnosis not present

## 2018-11-08 DIAGNOSIS — D472 Monoclonal gammopathy: Secondary | ICD-10-CM | POA: Diagnosis not present

## 2018-11-08 DIAGNOSIS — I129 Hypertensive chronic kidney disease with stage 1 through stage 4 chronic kidney disease, or unspecified chronic kidney disease: Secondary | ICD-10-CM | POA: Diagnosis not present

## 2018-11-08 DIAGNOSIS — M23602 Other spontaneous disruption of unspecified ligament of left knee: Secondary | ICD-10-CM | POA: Diagnosis not present

## 2018-11-08 DIAGNOSIS — S86812D Strain of other muscle(s) and tendon(s) at lower leg level, left leg, subsequent encounter: Secondary | ICD-10-CM | POA: Diagnosis not present

## 2018-11-08 DIAGNOSIS — I2510 Atherosclerotic heart disease of native coronary artery without angina pectoris: Secondary | ICD-10-CM | POA: Diagnosis not present

## 2018-11-08 DIAGNOSIS — D631 Anemia in chronic kidney disease: Secondary | ICD-10-CM | POA: Diagnosis not present

## 2018-11-09 DIAGNOSIS — S76112D Strain of left quadriceps muscle, fascia and tendon, subsequent encounter: Secondary | ICD-10-CM | POA: Diagnosis not present

## 2018-11-09 DIAGNOSIS — S83412D Sprain of medial collateral ligament of left knee, subsequent encounter: Secondary | ICD-10-CM | POA: Diagnosis not present

## 2018-11-10 ENCOUNTER — Ambulatory Visit (INDEPENDENT_AMBULATORY_CARE_PROVIDER_SITE_OTHER): Payer: Medicare Other | Admitting: Internal Medicine

## 2018-11-10 ENCOUNTER — Encounter: Payer: Self-pay | Admitting: Internal Medicine

## 2018-11-10 ENCOUNTER — Other Ambulatory Visit: Payer: Self-pay

## 2018-11-10 VITALS — BP 122/76 | HR 70 | Temp 97.4°F | Wt 183.0 lb

## 2018-11-10 DIAGNOSIS — S76112D Strain of left quadriceps muscle, fascia and tendon, subsequent encounter: Secondary | ICD-10-CM | POA: Diagnosis not present

## 2018-11-10 NOTE — Patient Instructions (Signed)
Quadriceps Tendon Tear  A quadriceps tendon tear or disruption is a partial or complete tear of the tendon between the quadriceps muscles and the kneecap (patella). Tendons connect muscles to bone. The quadriceps muscles are located on the front of the thigh and are primarily used in straightening the knee. With a partial tear, the tendon is overstretched and some of the fibers are frayed. With a complete tear, the quadriceps muscle is detached from the kneecap. This is very rare. What are the causes? This condition can be caused by injury such as:  A deep cut on your thigh that injures the tendon.  Falling on your knee, which may result in breaking your patella. The condition can also occur if you jump and land flat on your foot with your knee bent, causing a quick and forceful tightening (contraction) of your quadriceps. What increases the risk? The following factors may make you more likely to develop this condition:  Participating in: ? Activities that involve jumping, such as basketball. ? Activities in which your knee muscles contract suddenly and forcefully, such as doing jumps or moguls in downhill skiing.  Having a weakened tendon from: ? Long-term (chronic) quadriceps tendinitis. ? Long periods of not moving your knee (immobilization). ? Repeated steroid injections into the quadriceps tendon. ? Medical conditions such as diabetes, lupus, or rheumatoid arthritis. ? Degeneration over time. Most quadriceps tendon tears occur in males over 40 years of age. What are the signs or symptoms? Symptoms of this condition include:  Hearing a popping sound or feeling a tear above your patella at the time of injury.  Pain and tenderness over your thigh. The pain may get worse when you use the quadriceps muscles.  Bruising.  Difficulty walking, or a feeling that your knee may buckle.  A sagging kneecap or an indentation above your kneecap.  Not being able to straighten your knee. How  is this diagnosed? This condition may be diagnosed based on:  Your symptoms and medical history.  A physical exam. During the exam, your health care provider will: ? Feel the area above your kneecap. ? Test the motion and strength of your knee.  Imaging tests to rule out other conditions and to confirm the diagnosis. These may include: ? X-rays to check for a bone injury, such as a fracture. ? An ultrasound or an MRI to look at the muscles and tendons around your knee. How is this treated? This condition may be treated with:  Medicines to help reduce pain and inflammation.  RICE therapy. This includes resting, icing, applying pressure (compression), and raising (elevating) the injured area.  A knee brace (immobilizer) to keep the knee straight while the tendon heals. Typically, the brace will be worn for about 6 weeks.  Crutches to keep weight off of your injured leg.  Physical therapy to improve strength and flexibility. If the injury involves a complete tear of the tendon, surgery is usually needed. Follow these instructions at home: RICE therapy   Rest the injured leg.  If directed, put ice on the injured area. ? If you have a removable knee brace, remove it as told by your health care provider. ? Put ice in a plastic bag. ? Place a towel between your skin and the bag. ? Leave the ice on for 20 minutes, 2-3 times a day.  Apply a compression bandage to the area as told by your health care provider.  Elevate the injured area above the level of your heart while you   are sitting or lying down. If you have a knee brace:  Wear the brace as told by your health care provider. Remove it only as told by your health care provider.  Loosen the brace if your toes tingle, become numb, or turn cold and blue.  Keep the brace clean.  If the brace is not waterproof: ? Do not let it get wet. ? Cover it with a watertight covering when you take a bath or shower.  Ask your health care  provider when it is safe to drive if you have a knee brace. Activity  Do not use the injured limb to support your body weight until your health care provider says that you can. Use crutches as told by your health care provider.  Do exercises as told by your health care provider.  Return to your normal activities as told by your health care provider. Ask your health care provider what activities are safe for you. General instructions  Take over-the-counter and prescription medicines only as told by your health care provider.  Do not use any products that contain nicotine or tobacco, such as cigarettes, e-cigarettes, and chewing tobacco. These can delay healing. If you need help quitting, ask your health care provider.  Keep all follow-up visits as told by your health care provider. This is important. How is this prevented?  Warm up and stretch before being active.  Cool down and stretch after being active.  Give your body time to rest between periods of activity.  Maintain physical fitness, including: ? Strength. ? Flexibility.  Be safe and responsible while being active. This will help you avoid falls. Contact a health care provider if:  Your pain and swelling continue or get worse, even with treatment and rest.  You are unable to walk or stand without your knee feeling like it will buckle. Get help right away if:  You are unable to straighten your knee from a bent position. Summary  A quadriceps tendon tear or disruption is a partial or complete tear of the tendon between the quadriceps muscles and the kneecap.  This condition is caused by injury to the area.  This condition is treated with RICE therapy, physical therapy, medicines, and surgery if the tendon is completely torn.  Do not use the injured limb to support your body weight until your health care provider says that you can. Use crutches as told by your health care provider.  Return to your normal activities as  told by your health care provider. Ask your health care provider what activities are safe for you. This information is not intended to replace advice given to you by your health care provider. Make sure you discuss any questions you have with your health care provider. Document Released: 03/09/2005 Document Revised: 03/08/2018 Document Reviewed: 03/08/2018 [employer]vier Patient Education  2020 [employer]  

## 2018-11-10 NOTE — Progress Notes (Signed)
Subjective:    Patient ID: ISSAAC Brown, male    DOB: 1946/03/12, 73 y.o.   MRN: [id number]  HPI  Pt presents to the clinic today for TCM hospital follow up. He went to the ER 7/27 after a fall. He ending up having a left quadriceps tendon rupture. He underwent surgery by Dr. Percell Miller and was discharged home on 7/31. His daughter felt like he was requiring more care than she could handle, so he was transferred to [facility] for rehab where he stayed for 1 week. He has been discharged home and is working with PT/OT. He reports his pain is controlled. It tends to get worse after PT/OT but he expects that. He has followed up with ortho yesterday. They told him everything is healing well. He is ton continue with PT/OT and follow up with ortho in 2 weeks. He has no concerns today.  Review of Systems      Past Medical History:  Diagnosis Date  . Acute myocardial infarction ([place]) 06/1983  . Arthritis   . Atrial fibrillation ([place])   . Automatic implantable cardiac defibrillator in situ   . Borderline diabetes mellitus   . BPH without obstruction/lower urinary tract symptoms   . CAD (coronary artery disease)   . Chronic kidney disease, stage 1   . Colonic polyp 2004   hyperplastic   . COPD (chronic obstructive pulmonary disease) ([place])   . Diabetes mellitus without complication ([place])   . Fall   . GERD (gastroesophageal reflux disease)   . History of renal insufficiency syndrome   . HTN (hypertension)   . Hyperlipidemia   . Knee derangement 09/2018   LEFT KNEE  . Monoclonal gammopathy   . 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 Seizure disorder ([place])   . Stroke [facility])    pt reports he has had 3    Current Outpatient Medications  Medication Sig Dispense Refill  . acetaminophen (TYLENOL) 325 MG tablet Take 650 mg by mouth 4 (four) times daily as needed.    . baclofen (LIORESAL) 10 MG tablet Take 1 tablet (10 mg total) by mouth 2 (two) times daily as needed for  muscle spasms. 20 each 0  . bisacodyl (DULCOLAX) 10 MG suppository Place 1 suppository (10 mg total) rectally daily as needed for moderate constipation. 12 suppository 0  . ciclopirox (LOPROX) 0.77 % cream Apply 1 application topically 2 (two) times daily as needed (for rash).     . Clotrimazole 1 % OINT Apply 1 application topically daily. (Patient taking differently: Apply 1 application topically daily as needed (for rash). ) 30 g 0  . fluticasone (FLOVENT HFA) 110 MCG/ACT inhaler Inhale 1 puff into the lungs 2 (two) times daily. 1 Inhaler 12  . glipiZIDE (GLUCOTROL) 5 MG tablet Take 1 tablet (5 mg total) by mouth 2 (two) times daily before a meal. MUST SCHEDULE ANNUAL EXAM 180 tablet 0  . HYDROcodone-acetaminophen (NORCO) 5-325 MG tablet Take 1-2 tablets by mouth every 6 (six) hours as needed for severe pain (Take tylenol for mild and moderate pain). (Patient taking differently: Take 1 tablet by mouth 4 (four) times daily as needed for severe pain (Take tylenol for mild and moderate pain). ) 30 tablet 0  . lactulose (CHRONULAC) 10 GM/15ML solution Take 20 g by mouth 2 (two) times daily as needed for mild constipation.    . levETIRAcetam (KEPPRA) 500 MG tablet Take 500-1,000 mg by mouth See admin instructions. Take one tablet by  mouth in the morning and two tablets by mouth at night    . losartan (COZAAR) 100 MG tablet TAKE 1 TABLET BY MOUTH EVERY DAY (Patient taking differently: Take 100 mg by mouth daily. ) 30 tablet 5  . metoprolol tartrate (LOPRESSOR) 25 MG tablet Take 12.5 mg by mouth 2 (two) times daily.    . Multiple Vitamin (MULTIVITAMIN) tablet Take 1 tablet by mouth daily.      . naftifine (NAFTIN) 1 % cream Apply 1 application topically 2 (two) times daily as needed (For  infection between toes).     . niacin 500 MG tablet Take 500 mg by mouth daily.    . Niacin, Antihyperlipidemic, 500 MG TABS Take 500 mg by mouth daily. 30 tablet 2  . NIFEdipine (PROCARDIA-XL/ADALAT CC) 30 MG 24 hr  tablet Take 30 mg by mouth daily.    . Omega-3 Fatty Acids (FISH OIL) 1200 MG CAPS Take 1,200 mg by mouth daily.     . ondansetron (ZOFRAN) 4 MG tablet Take 1 tablet (4 mg total) by mouth every 6 (six) hours as needed for nausea. 20 tablet 0  . polyethylene glycol (MIRALAX / GLYCOLAX) 17 g packet Take 17 g by mouth 2 (two) times daily. 14 each 0  . Rivaroxaban (XARELTO) 20 MG TABS tablet Take 20 mg by mouth daily.     Marland Kitchen senna (SENOKOT) 8.6 MG TABS tablet Take 1 tablet (8.6 mg total) by mouth 2 (two) times daily. 120 tablet 0  . simvastatin (ZOCOR) 20 MG tablet Take 10 mg by mouth at bedtime.     . SYMBICORT 160-4.5 MCG/ACT inhaler INHALE 2 PUFFS INTO THE LUNGS 2 (TWO) TIMES DAILY. 10 Inhaler 0  . tamsulosin (FLOMAX) 0.4 MG CAPS capsule Take 0.4 mg by mouth daily.  5   No current facility-administered medications for this visit.     Allergies  Allergen Reactions  . Lipitor [Atorvastatin] Rash    Family History  Problem Relation Age of Onset  . Colon cancer Brother   . Heart attack Mother   . Heart attack Brother   . Seizures Neg Hx     Social History   Socioeconomic History  . Marital status: Single    Spouse name: Not on file  . Number of children: 1  . Years of education: Not on file  . Highest education level: Not on file  Occupational History  . Occupation: retired    Fish farm manager: RETIRED  Social Needs  . Financial resource strain: Not on file  . Food insecurity    Worry: Not on file    Inability: Not on file  . Transportation needs    Medical: Not on file    Non-medical: Not on file  Tobacco Use  . Smoking status: Former Smoker    Packs/day: 0.50    Years: 15.00    Pack years: 7.50    Types: Cigarettes    Quit date: 03/23/2004    Years since quitting: 14.6  . Smokeless tobacco: Never Used  Substance and Sexual Activity  . Alcohol use: Yes    Alcohol/week: 1.0 standard drinks    Types: 1 Glasses of wine per week    Comment: rare--wine  . Drug use: No  .  Sexual activity: Not on file  Lifestyle  . Physical activity    Days per week: Not on file    Minutes per session: Not on file  . Stress: Not on file  Relationships  . Social connections  Talks on phone: Not on file    Gets together: Not on file    Attends religious service: Not on file    Active member of club or organization: Not on file    Attends meetings of clubs or organizations: Not on file    Relationship status: Not on file  . Intimate partner violence    Fear of current or ex partner: Not on file    Emotionally abused: Not on file    Physically abused: Not on file    Forced sexual activity: Not on file  Other Topics Concern  . Not on file  Social History Narrative   ICD-St. Jude  Remote-Yes      Live currently @ [facility] for rehabilitation. Plans to discharge to his daughter's house for continuation of rehab.     Constitutional: Denies fever, malaise, fatigue, headache or abrupt weight changes.  Respiratory: Denies difficulty breathing, shortness of breath, cough or sputum production.   Cardiovascular: Denies chest pain, chest tightness, palpitations or swelling in the hands or feet.  Musculoskeletal: Pt reports intermittent left leg pain, difficulty with gait. Denies decrease in range of motion, muscle pain.    No other specific complaints in a complete review of systems (except as listed in HPI above).  Objective:   Physical Exam  BP 122/76   Pulse 70   Temp (!) 97.4 F (36.3 C) (Temporal)   Wt 183 lb (83 kg)   SpO2 97%   BMI 27.02 kg/m  Wt Readings from Last 3 Encounters:  11/10/18 183 lb (83 kg)  11/02/18 195 lb (88.5 kg)  10/20/18 192 lb 12.8 oz (87.5 kg)    General: Appears his stated age, well developed, well nourished in NAD. Skin: Surgical incision not visualized. Cardiovascular: Normal rate with irregular rhythm. Pedal pulse 2+ bilaterally. Pulmonary/Chest: Normal effort and positive vesicular breath sounds. No respiratory distress. No  wheezes, rales or ronchi noted.  Musculoskeletal: Left leg in full leg brace. Walking with walker. Neurological: Alert and oriented. Sensation intact to BLE.  BMET    Component Value Date/Time   NA 135 10/21/2018 0609   NA 140 01/30/2016 1129   K 4.5 10/21/2018 0609   K 4.4 01/30/2016 1129   CL 102 10/21/2018 0609   CL 104 01/06/2012 1355   CO2 24 10/21/2018 0609   CO2 26 01/30/2016 1129   GLUCOSE 148 (H) 10/21/2018 0609   GLUCOSE 191 (H) 01/30/2016 1129   GLUCOSE 77 01/06/2012 1355   BUN 19 10/21/2018 0609   BUN 15.0 01/30/2016 1129   CREATININE 1.52 (H) 10/21/2018 0609   CREATININE 1.29 (H) 02/17/2016 1011   CREATININE 1.4 (H) 01/30/2016 1129   CALCIUM 8.5 (L) 10/21/2018 0609   CALCIUM 9.4 01/30/2016 1129   GFRNONAA 46 (L) 10/21/2018 0609   GFRAA 53 (L) 10/21/2018 0609    Lipid Panel     Component Value Date/Time   CHOL 124 04/21/2018 0942   TRIG 161.0 (H) 04/21/2018 0942   HDL 44.60 04/21/2018 0942   CHOLHDL 3 04/21/2018 0942   VLDL 32.2 04/21/2018 0942   LDLCALC 47 04/21/2018 0942    CBC    Component Value Date/Time   WBC 9.3 10/21/2018 0609   RBC 3.40 (L) 10/21/2018 0609   HGB 10.5 (L) 10/21/2018 0609   HGB 13.5 01/29/2017 0948   HCT 31.4 (L) 10/21/2018 0609   HCT 40.9 01/29/2017 0948   PLT 162 10/21/2018 0609   PLT 187 01/29/2017 0948   MCV 92.4  10/21/2018 0609   MCV 91.7 01/29/2017 0948   MCH 30.9 10/21/2018 0609   MCHC 33.4 10/21/2018 0609   RDW 12.2 10/21/2018 0609   RDW 12.5 01/29/2017 0948   LYMPHSABS 1.0 10/21/2018 0609   LYMPHSABS 1.1 01/29/2017 0948   MONOABS 0.8 10/21/2018 0609   MONOABS 0.2 01/29/2017 0948   EOSABS 0.0 10/21/2018 0609   EOSABS 0.1 01/29/2017 0948   BASOSABS 0.0 10/21/2018 0609   BASOSABS 0.0 01/29/2017 0948    Hgb A1C Lab Results  Component Value Date   HGBA1C 6.1 04/21/2018            Assessment & Plan:   [facility] Follow Up for Left Quadriceps Tendon Rupture:  Hospital labs, notes and imaging  reviewed He will continue current meds for now Continue home PT/OT Follow up with orthopedics as scheduled  Return precautions discussed Webb Silversmith, NP

## 2018-11-11 DIAGNOSIS — J449 Chronic obstructive pulmonary disease, unspecified: Secondary | ICD-10-CM | POA: Diagnosis not present

## 2018-11-11 DIAGNOSIS — N183 Chronic kidney disease, stage 3 (moderate): Secondary | ICD-10-CM | POA: Diagnosis not present

## 2018-11-11 DIAGNOSIS — D472 Monoclonal gammopathy: Secondary | ICD-10-CM | POA: Diagnosis not present

## 2018-11-11 DIAGNOSIS — I2510 Atherosclerotic heart disease of native coronary artery without angina pectoris: Secondary | ICD-10-CM | POA: Diagnosis not present

## 2018-11-11 DIAGNOSIS — E785 Hyperlipidemia, unspecified: Secondary | ICD-10-CM | POA: Diagnosis not present

## 2018-11-11 DIAGNOSIS — M23602 Other spontaneous disruption of unspecified ligament of left knee: Secondary | ICD-10-CM | POA: Diagnosis not present

## 2018-11-11 DIAGNOSIS — I129 Hypertensive chronic kidney disease with stage 1 through stage 4 chronic kidney disease, or unspecified chronic kidney disease: Secondary | ICD-10-CM | POA: Diagnosis not present

## 2018-11-11 DIAGNOSIS — E1122 Type 2 diabetes mellitus with diabetic chronic kidney disease: Secondary | ICD-10-CM | POA: Diagnosis not present

## 2018-11-11 DIAGNOSIS — S86812D Strain of other muscle(s) and tendon(s) at lower leg level, left leg, subsequent encounter: Secondary | ICD-10-CM | POA: Diagnosis not present

## 2018-11-11 DIAGNOSIS — D631 Anemia in chronic kidney disease: Secondary | ICD-10-CM | POA: Diagnosis not present

## 2018-11-11 DIAGNOSIS — I4891 Unspecified atrial fibrillation: Secondary | ICD-10-CM | POA: Diagnosis not present

## 2018-11-11 DIAGNOSIS — I498 Other specified cardiac arrhythmias: Secondary | ICD-10-CM | POA: Diagnosis not present

## 2018-11-14 DIAGNOSIS — I129 Hypertensive chronic kidney disease with stage 1 through stage 4 chronic kidney disease, or unspecified chronic kidney disease: Secondary | ICD-10-CM | POA: Diagnosis not present

## 2018-11-14 DIAGNOSIS — I4891 Unspecified atrial fibrillation: Secondary | ICD-10-CM | POA: Diagnosis not present

## 2018-11-14 DIAGNOSIS — D631 Anemia in chronic kidney disease: Secondary | ICD-10-CM | POA: Diagnosis not present

## 2018-11-14 DIAGNOSIS — I2510 Atherosclerotic heart disease of native coronary artery without angina pectoris: Secondary | ICD-10-CM | POA: Diagnosis not present

## 2018-11-14 DIAGNOSIS — I498 Other specified cardiac arrhythmias: Secondary | ICD-10-CM | POA: Diagnosis not present

## 2018-11-14 DIAGNOSIS — M23602 Other spontaneous disruption of unspecified ligament of left knee: Secondary | ICD-10-CM | POA: Diagnosis not present

## 2018-11-14 DIAGNOSIS — N183 Chronic kidney disease, stage 3 (moderate): Secondary | ICD-10-CM | POA: Diagnosis not present

## 2018-11-14 DIAGNOSIS — S86812D Strain of other muscle(s) and tendon(s) at lower leg level, left leg, subsequent encounter: Secondary | ICD-10-CM | POA: Diagnosis not present

## 2018-11-14 DIAGNOSIS — E785 Hyperlipidemia, unspecified: Secondary | ICD-10-CM | POA: Diagnosis not present

## 2018-11-14 DIAGNOSIS — D472 Monoclonal gammopathy: Secondary | ICD-10-CM | POA: Diagnosis not present

## 2018-11-14 DIAGNOSIS — J449 Chronic obstructive pulmonary disease, unspecified: Secondary | ICD-10-CM | POA: Diagnosis not present

## 2018-11-14 DIAGNOSIS — E1122 Type 2 diabetes mellitus with diabetic chronic kidney disease: Secondary | ICD-10-CM | POA: Diagnosis not present

## 2018-11-16 DIAGNOSIS — S86812D Strain of other muscle(s) and tendon(s) at lower leg level, left leg, subsequent encounter: Secondary | ICD-10-CM | POA: Diagnosis not present

## 2018-11-16 DIAGNOSIS — I129 Hypertensive chronic kidney disease with stage 1 through stage 4 chronic kidney disease, or unspecified chronic kidney disease: Secondary | ICD-10-CM | POA: Diagnosis not present

## 2018-11-16 DIAGNOSIS — I2510 Atherosclerotic heart disease of native coronary artery without angina pectoris: Secondary | ICD-10-CM | POA: Diagnosis not present

## 2018-11-16 DIAGNOSIS — I498 Other specified cardiac arrhythmias: Secondary | ICD-10-CM | POA: Diagnosis not present

## 2018-11-16 DIAGNOSIS — E785 Hyperlipidemia, unspecified: Secondary | ICD-10-CM | POA: Diagnosis not present

## 2018-11-16 DIAGNOSIS — D631 Anemia in chronic kidney disease: Secondary | ICD-10-CM | POA: Diagnosis not present

## 2018-11-16 DIAGNOSIS — I4891 Unspecified atrial fibrillation: Secondary | ICD-10-CM | POA: Diagnosis not present

## 2018-11-16 DIAGNOSIS — D472 Monoclonal gammopathy: Secondary | ICD-10-CM | POA: Diagnosis not present

## 2018-11-16 DIAGNOSIS — J449 Chronic obstructive pulmonary disease, unspecified: Secondary | ICD-10-CM | POA: Diagnosis not present

## 2018-11-16 DIAGNOSIS — E1122 Type 2 diabetes mellitus with diabetic chronic kidney disease: Secondary | ICD-10-CM | POA: Diagnosis not present

## 2018-11-16 DIAGNOSIS — M23602 Other spontaneous disruption of unspecified ligament of left knee: Secondary | ICD-10-CM | POA: Diagnosis not present

## 2018-11-16 DIAGNOSIS — N183 Chronic kidney disease, stage 3 (moderate): Secondary | ICD-10-CM | POA: Diagnosis not present

## 2018-11-21 ENCOUNTER — Ambulatory Visit: Payer: Medicare Other | Admitting: Family Medicine

## 2018-11-21 DIAGNOSIS — J449 Chronic obstructive pulmonary disease, unspecified: Secondary | ICD-10-CM | POA: Diagnosis not present

## 2018-11-21 DIAGNOSIS — E785 Hyperlipidemia, unspecified: Secondary | ICD-10-CM | POA: Diagnosis not present

## 2018-11-21 DIAGNOSIS — D472 Monoclonal gammopathy: Secondary | ICD-10-CM | POA: Diagnosis not present

## 2018-11-21 DIAGNOSIS — I498 Other specified cardiac arrhythmias: Secondary | ICD-10-CM | POA: Diagnosis not present

## 2018-11-21 DIAGNOSIS — E1122 Type 2 diabetes mellitus with diabetic chronic kidney disease: Secondary | ICD-10-CM | POA: Diagnosis not present

## 2018-11-21 DIAGNOSIS — I2510 Atherosclerotic heart disease of native coronary artery without angina pectoris: Secondary | ICD-10-CM | POA: Diagnosis not present

## 2018-11-21 DIAGNOSIS — I4891 Unspecified atrial fibrillation: Secondary | ICD-10-CM | POA: Diagnosis not present

## 2018-11-21 DIAGNOSIS — S86812D Strain of other muscle(s) and tendon(s) at lower leg level, left leg, subsequent encounter: Secondary | ICD-10-CM | POA: Diagnosis not present

## 2018-11-21 DIAGNOSIS — D631 Anemia in chronic kidney disease: Secondary | ICD-10-CM | POA: Diagnosis not present

## 2018-11-21 DIAGNOSIS — M23602 Other spontaneous disruption of unspecified ligament of left knee: Secondary | ICD-10-CM | POA: Diagnosis not present

## 2018-11-21 DIAGNOSIS — N183 Chronic kidney disease, stage 3 (moderate): Secondary | ICD-10-CM | POA: Diagnosis not present

## 2018-11-21 DIAGNOSIS — I129 Hypertensive chronic kidney disease with stage 1 through stage 4 chronic kidney disease, or unspecified chronic kidney disease: Secondary | ICD-10-CM | POA: Diagnosis not present

## 2018-11-25 DIAGNOSIS — I498 Other specified cardiac arrhythmias: Secondary | ICD-10-CM | POA: Diagnosis not present

## 2018-11-25 DIAGNOSIS — I2510 Atherosclerotic heart disease of native coronary artery without angina pectoris: Secondary | ICD-10-CM | POA: Diagnosis not present

## 2018-11-25 DIAGNOSIS — D631 Anemia in chronic kidney disease: Secondary | ICD-10-CM | POA: Diagnosis not present

## 2018-11-25 DIAGNOSIS — I4891 Unspecified atrial fibrillation: Secondary | ICD-10-CM | POA: Diagnosis not present

## 2018-11-25 DIAGNOSIS — M23602 Other spontaneous disruption of unspecified ligament of left knee: Secondary | ICD-10-CM | POA: Diagnosis not present

## 2018-11-25 DIAGNOSIS — N183 Chronic kidney disease, stage 3 (moderate): Secondary | ICD-10-CM | POA: Diagnosis not present

## 2018-11-25 DIAGNOSIS — D472 Monoclonal gammopathy: Secondary | ICD-10-CM | POA: Diagnosis not present

## 2018-11-25 DIAGNOSIS — J449 Chronic obstructive pulmonary disease, unspecified: Secondary | ICD-10-CM | POA: Diagnosis not present

## 2018-11-25 DIAGNOSIS — E785 Hyperlipidemia, unspecified: Secondary | ICD-10-CM | POA: Diagnosis not present

## 2018-11-25 DIAGNOSIS — S86812D Strain of other muscle(s) and tendon(s) at lower leg level, left leg, subsequent encounter: Secondary | ICD-10-CM | POA: Diagnosis not present

## 2018-11-25 DIAGNOSIS — E1122 Type 2 diabetes mellitus with diabetic chronic kidney disease: Secondary | ICD-10-CM | POA: Diagnosis not present

## 2018-11-25 DIAGNOSIS — I129 Hypertensive chronic kidney disease with stage 1 through stage 4 chronic kidney disease, or unspecified chronic kidney disease: Secondary | ICD-10-CM | POA: Diagnosis not present

## 2018-11-30 ENCOUNTER — Ambulatory Visit (INDEPENDENT_AMBULATORY_CARE_PROVIDER_SITE_OTHER): Payer: Medicare Other | Admitting: *Deleted

## 2018-11-30 ENCOUNTER — Telehealth: Payer: Self-pay | Admitting: Internal Medicine

## 2018-11-30 DIAGNOSIS — I4821 Permanent atrial fibrillation: Secondary | ICD-10-CM

## 2018-11-30 LAB — CUP PACEART REMOTE DEVICE CHECK
Battery Remaining Longevity: 74 mo
Battery Remaining Percentage: 72 %
Battery Voltage: 2.98 V
Brady Statistic RV Percent Paced: 1 %
Date Time Interrogation Session: 20200909143306
HighPow Impedance: 50 Ohm
HighPow Impedance: 50 Ohm
Implantable Lead Implant Date: 20080527
Implantable Lead Location: 753860
Implantable Lead Model: 7121
Implantable Pulse Generator Implant Date: 20171201
Lead Channel Impedance Value: 450 Ohm
Lead Channel Pacing Threshold Amplitude: 1.25 V
Lead Channel Pacing Threshold Pulse Width: 0.5 ms
Lead Channel Sensing Intrinsic Amplitude: 11.8 mV
Lead Channel Setting Pacing Amplitude: 2.5 V
Lead Channel Setting Pacing Pulse Width: 0.5 ms
Lead Channel Setting Sensing Sensitivity: 0.5 mV
Pulse Gen Serial Number: 7283394

## 2018-11-30 NOTE — Telephone Encounter (Signed)
New Message:   Pt wants to  Know if you received his transmission today?

## 2018-11-30 NOTE — Telephone Encounter (Signed)
Confirmed that transmission received and no episodes or events.

## 2018-12-09 DIAGNOSIS — S76112D Strain of left quadriceps muscle, fascia and tendon, subsequent encounter: Secondary | ICD-10-CM | POA: Diagnosis not present

## 2018-12-14 DIAGNOSIS — M25662 Stiffness of left knee, not elsewhere classified: Secondary | ICD-10-CM | POA: Diagnosis not present

## 2018-12-14 DIAGNOSIS — M25562 Pain in left knee: Secondary | ICD-10-CM | POA: Diagnosis not present

## 2018-12-14 DIAGNOSIS — M6281 Muscle weakness (generalized): Secondary | ICD-10-CM | POA: Diagnosis not present

## 2018-12-14 DIAGNOSIS — S76112D Strain of left quadriceps muscle, fascia and tendon, subsequent encounter: Secondary | ICD-10-CM | POA: Diagnosis not present

## 2018-12-15 NOTE — Progress Notes (Signed)
Remote ICD transmission.   

## 2018-12-16 DIAGNOSIS — S76112D Strain of left quadriceps muscle, fascia and tendon, subsequent encounter: Secondary | ICD-10-CM | POA: Diagnosis not present

## 2018-12-16 DIAGNOSIS — M25562 Pain in left knee: Secondary | ICD-10-CM | POA: Diagnosis not present

## 2018-12-16 DIAGNOSIS — M25662 Stiffness of left knee, not elsewhere classified: Secondary | ICD-10-CM | POA: Diagnosis not present

## 2018-12-16 DIAGNOSIS — M6281 Muscle weakness (generalized): Secondary | ICD-10-CM | POA: Diagnosis not present

## 2018-12-20 DIAGNOSIS — M25562 Pain in left knee: Secondary | ICD-10-CM | POA: Diagnosis not present

## 2018-12-20 DIAGNOSIS — S76112D Strain of left quadriceps muscle, fascia and tendon, subsequent encounter: Secondary | ICD-10-CM | POA: Diagnosis not present

## 2018-12-20 DIAGNOSIS — M25662 Stiffness of left knee, not elsewhere classified: Secondary | ICD-10-CM | POA: Diagnosis not present

## 2018-12-20 DIAGNOSIS — M6281 Muscle weakness (generalized): Secondary | ICD-10-CM | POA: Diagnosis not present

## 2018-12-23 DIAGNOSIS — S76112D Strain of left quadriceps muscle, fascia and tendon, subsequent encounter: Secondary | ICD-10-CM | POA: Diagnosis not present

## 2018-12-23 DIAGNOSIS — M6281 Muscle weakness (generalized): Secondary | ICD-10-CM | POA: Diagnosis not present

## 2018-12-23 DIAGNOSIS — M25662 Stiffness of left knee, not elsewhere classified: Secondary | ICD-10-CM | POA: Diagnosis not present

## 2018-12-23 DIAGNOSIS — M25562 Pain in left knee: Secondary | ICD-10-CM | POA: Diagnosis not present

## 2018-12-26 ENCOUNTER — Other Ambulatory Visit: Payer: Self-pay

## 2018-12-26 ENCOUNTER — Ambulatory Visit (INDEPENDENT_AMBULATORY_CARE_PROVIDER_SITE_OTHER): Payer: Medicare Other | Admitting: Family Medicine

## 2018-12-26 ENCOUNTER — Encounter: Payer: Self-pay | Admitting: Family Medicine

## 2018-12-26 VITALS — BP 96/58 | HR 50 | Temp 97.9°F | Ht 68.25 in | Wt 184.0 lb

## 2018-12-26 DIAGNOSIS — M722 Plantar fascial fibromatosis: Secondary | ICD-10-CM | POA: Diagnosis not present

## 2018-12-26 NOTE — Progress Notes (Signed)
Brad Brown T. Donalee Gaumond, MD [facility] and [place] at [facility] [place], [address] Phone: [phone]  FAX: [place] - 73 y.o. male  MRN [id number]  Date of Birth: 09/28/1945  Visit Date: 12/26/2018  PCP: Jearld Fenton, NP  Referred by: Jearld Fenton, NP  Chief Complaint  Patient presents with  . Follow-up    Plantar Fasciitis Left   Subjective:   Brad Brown is a 73 y.o. very pleasant male patient with Body mass index is 27.77 kg/m. who presents with the following:  He is here for plantar fasciitis follow-up, but that is essentially better.  Unfortunately did have a ruptured left-sided quadriceps tendon which was a required surgery.  This is been managed by Raliegh Ip.  His foot and heel have no tenderness at this point.  Recently ruptured l quad. Tendon.  Here for PF f/u. S/p rehab and PF injection at last OV.  PF is much better.   Lab Results  Component Value Date   HGBA1C 6.1 04/21/2018    Wt Readings from Last 3 Encounters:  12/26/18 184 lb (83.5 kg)  11/10/18 183 lb (83 kg)  11/02/18 195 lb (88.5 kg)    Lost 11 pounds in 2 months.   Rash cream worked Engineer, manufacturing.    Past Medical History, Surgical History, Social History, Family History, Problem List, Medications, and Allergies have been reviewed and updated if relevant.  Patient Active Problem List   Diagnosis Date Noted  . BPH (benign prostatic hyperplasia) 04/09/2016  . GERD (gastroesophageal reflux disease) 08/27/2013  . Arthritis 08/27/2013  . Family hx of colon cancer 06/26/2013  . Atrial fibrillation ([place]) 06/09/2012  . Brugada syndrome 08/05/2011  . MGUS (monoclonal gammopathy of unknown significance) 08/05/2011  . Hypertension 08/05/2010  . MI 07/24/2008  . Automatic implantable cardioverter-defibrillator in situ 07/24/2008  . Type 2 diabetes mellitus with stage 1 chronic kidney disease, without  long-term current use of insulin ([place]) 02/12/2008  . Coronary atherosclerosis 03/29/2007  . HLD (hyperlipidemia) 02/08/2007  . Cerebral artery occlusion with cerebral infarction ([place]) 02/08/2007  . Seizures ([place]) 02/08/2007    Past Medical History:  Diagnosis Date  . Acute myocardial infarction ([place]) 06/1983  . Arthritis   . Atrial fibrillation ([place])   . Automatic implantable cardiac defibrillator in situ   . Borderline diabetes mellitus   . BPH without obstruction/lower urinary tract symptoms   . CAD (coronary artery disease)   . Chronic kidney disease, stage 1   . Colonic polyp 2004   hyperplastic   . COPD (chronic obstructive pulmonary disease) ([place])   . Diabetes mellitus without complication ([place])   . Fall   . GERD (gastroesophageal reflux disease)   . History of renal insufficiency syndrome   . HTN (hypertension)   . Hyperlipidemia   . Knee derangement 09/2018   LEFT KNEE  . Monoclonal gammopathy   . 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 Seizure disorder ([place])   . Stroke [facility])    pt reports he has had 3    Past Surgical History:  Procedure Laterality Date  . CARDIAC CATHETERIZATION  1987   Showed distal left circumflex 100% occluded   . CARDIAC DEFIBRILLATOR PLACEMENT  08/17/2006   Implantation of a St. Jude single chamber defibrillator  . EP IMPLANTABLE DEVICE N/A 02/21/2016   Procedure: ICD Generator Changeout;  Surgeon: Evans Lance, MD;  Location: [place]  CV LAB;  Service: Cardiovascular;  Laterality: N/A;  . INGUINAL HERNIA REPAIR Left   . QUADRICEPS TENDON REPAIR Left 10/20/2018   Procedure: REPAIR QUADRICEP TENDON;  Surgeon: Renette Butters, MD;  Location: [place];  Service: Orthopedics;  Laterality: Left;    Social History   Socioeconomic History  . Marital status: Single    Spouse name: Not on file  . Number of children: 1  . Years of education: Not on file  . Highest education level: Not on file  Occupational History  .  Occupation: retired    Fish farm manager: RETIRED  Social Needs  . Financial resource strain: Not on file  . Food insecurity    Worry: Not on file    Inability: Not on file  . Transportation needs    Medical: Not on file    Non-medical: Not on file  Tobacco Use  . Smoking status: Former Smoker    Packs/day: 0.50    Years: 15.00    Pack years: 7.50    Types: Cigarettes    Quit date: 03/23/2004    Years since quitting: 14.7  . Smokeless tobacco: Never Used  Substance and Sexual Activity  . Alcohol use: Yes    Alcohol/week: 1.0 standard drinks    Types: 1 Glasses of wine per week    Comment: rare--wine  . Drug use: No  . Sexual activity: Not on file  Lifestyle  . Physical activity    Days per week: Not on file    Minutes per session: Not on file  . Stress: Not on file  Relationships  . Social Herbalist on phone: Not on file    Gets together: Not on file    Attends religious service: Not on file    Active member of club or organization: Not on file    Attends meetings of clubs or organizations: Not on file    Relationship status: Not on file  . Intimate partner violence    Fear of current or ex partner: Not on file    Emotionally abused: Not on file    Physically abused: Not on file    Forced sexual activity: Not on file  Other Topics Concern  . Not on file  Social History Narrative   ICD-St. Jude  Remote-Yes      Live currently @ [facility] for rehabilitation. Plans to discharge to his daughter's house for continuation of rehab.    Family History  Problem Relation Age of Onset  . Colon cancer Brother   . Heart attack Mother   . Heart attack Brother   . Seizures Neg Hx     Allergies  Allergen Reactions  . Lipitor [Atorvastatin] Rash    Medication list reviewed and updated in full in [place].  GEN: No fevers, chills. Nontoxic. Primarily MSK c/o today. MSK: Detailed in the HPI GI: tolerating PO intake without difficulty Neuro: No numbness,  parasthesias, or tingling associated. Otherwise the pertinent positives of the ROS are noted above.   Objective:   BP (!) 96/58   Pulse (!) 50   Temp 97.9 F (36.6 C) (Temporal)   Ht 5' 8.25" (1.734 m)   Wt 184 lb (83.5 kg)   SpO2 99%   BMI 27.77 kg/m    GEN: WDWN, NAD, Non-toxic, Alert & Oriented x 3 HEENT: Atraumatic, Normocephalic.  Ears and Nose: No external deformity. PSYCH: Normally interactive. Conversant. Not depressed or anxious appearing.  Calm demeanor.  Left lower leg is in a full hinged knee brace.  The entirety of the lower extremity with the exception of the bottom of his foot was not examined.  His foot is and ankle were nontender throughout.  Radiology: No results found.  Assessment and Plan:     ICD-10-CM   1. Plantar fasciitis, left  M72.2    Completely resolved.  Hopefully he will do well after his quadriceps tendon surgery.  Follow-up: No follow-ups on file.  No orders of the defined types were placed in this encounter.  No orders of the defined types were placed in this encounter.   Signed,  Maud Deed. Jakai Onofre, MD   Outpatient Encounter Medications as of 12/26/2018  Medication Sig  . acetaminophen (TYLENOL) 325 MG tablet Take 650 mg by mouth 4 (four) times daily as needed.  . baclofen (LIORESAL) 10 MG tablet Take 1 tablet (10 mg total) by mouth 2 (two) times daily as needed for muscle spasms.  . bisacodyl (DULCOLAX) 10 MG suppository Place 1 suppository (10 mg total) rectally daily as needed for moderate constipation.  . ciclopirox (LOPROX) 0.77 % cream Apply 1 application topically 2 (two) times daily as needed (for rash).   . Clotrimazole 1 % OINT Apply 1 application topically daily. (Patient taking differently: Apply 1 application topically daily as needed (for rash). )  . fluticasone (FLOVENT HFA) 110 MCG/ACT inhaler Inhale 1 puff into the lungs 2 (two) times daily.  Marland Kitchen glipiZIDE (GLUCOTROL) 5 MG tablet Take 1 tablet (5 mg total) by mouth  2 (two) times daily before a meal. MUST SCHEDULE ANNUAL EXAM  . HYDROcodone-acetaminophen (NORCO) 5-325 MG tablet Take 1-2 tablets by mouth every 6 (six) hours as needed for severe pain (Take tylenol for mild and moderate pain). (Patient taking differently: Take 1 tablet by mouth 4 (four) times daily as needed for severe pain (Take tylenol for mild and moderate pain). )  . lactulose (CHRONULAC) 10 GM/15ML solution Take 20 g by mouth 2 (two) times daily as needed for mild constipation.  . levETIRAcetam (KEPPRA) 500 MG tablet Take 500-1,000 mg by mouth See admin instructions. Take one tablet by mouth in the morning and two tablets by mouth at night  . losartan (COZAAR) 100 MG tablet TAKE 1 TABLET BY MOUTH EVERY DAY (Patient taking differently: Take 100 mg by mouth daily. )  . metoprolol tartrate (LOPRESSOR) 25 MG tablet Take 12.5 mg by mouth 2 (two) times daily.  . Multiple Vitamin (MULTIVITAMIN) tablet Take 1 tablet by mouth daily.    . naftifine (NAFTIN) 1 % cream Apply 1 application topically 2 (two) times daily as needed (For  infection between toes).   . niacin 500 MG tablet Take 500 mg by mouth daily.  . Niacin, Antihyperlipidemic, 500 MG TABS Take 500 mg by mouth daily.  Marland Kitchen NIFEdipine (PROCARDIA-XL/ADALAT CC) 30 MG 24 hr tablet Take 30 mg by mouth daily.  . Omega-3 Fatty Acids (FISH OIL) 1200 MG CAPS Take 1,200 mg by mouth daily.   . ondansetron (ZOFRAN) 4 MG tablet Take 1 tablet (4 mg total) by mouth every 6 (six) hours as needed for nausea.  . polyethylene glycol (MIRALAX / GLYCOLAX) 17 g packet Take 17 g by mouth 2 (two) times daily.  . Rivaroxaban (XARELTO) 20 MG TABS tablet Take 20 mg by mouth daily.   Marland Kitchen senna (SENOKOT) 8.6 MG TABS tablet Take 1 tablet (8.6 mg total) by mouth 2 (two) times daily.  . simvastatin (ZOCOR) 20 MG tablet  Take 10 mg by mouth at bedtime.   . SYMBICORT 160-4.5 MCG/ACT inhaler INHALE 2 PUFFS INTO THE LUNGS 2 (TWO) TIMES DAILY.  . tamsulosin (FLOMAX) 0.4 MG CAPS  capsule Take 0.4 mg by mouth daily.   No facility-administered encounter medications on file as of 12/26/2018.

## 2018-12-27 DIAGNOSIS — S76112D Strain of left quadriceps muscle, fascia and tendon, subsequent encounter: Secondary | ICD-10-CM | POA: Diagnosis not present

## 2018-12-27 DIAGNOSIS — M6281 Muscle weakness (generalized): Secondary | ICD-10-CM | POA: Diagnosis not present

## 2018-12-27 DIAGNOSIS — M25662 Stiffness of left knee, not elsewhere classified: Secondary | ICD-10-CM | POA: Diagnosis not present

## 2018-12-27 DIAGNOSIS — M25562 Pain in left knee: Secondary | ICD-10-CM | POA: Diagnosis not present

## 2018-12-30 DIAGNOSIS — M25662 Stiffness of left knee, not elsewhere classified: Secondary | ICD-10-CM | POA: Diagnosis not present

## 2018-12-30 DIAGNOSIS — M6281 Muscle weakness (generalized): Secondary | ICD-10-CM | POA: Diagnosis not present

## 2018-12-30 DIAGNOSIS — M25562 Pain in left knee: Secondary | ICD-10-CM | POA: Diagnosis not present

## 2018-12-30 DIAGNOSIS — S76112D Strain of left quadriceps muscle, fascia and tendon, subsequent encounter: Secondary | ICD-10-CM | POA: Diagnosis not present

## 2019-01-03 DIAGNOSIS — M6281 Muscle weakness (generalized): Secondary | ICD-10-CM | POA: Diagnosis not present

## 2019-01-03 DIAGNOSIS — M25562 Pain in left knee: Secondary | ICD-10-CM | POA: Diagnosis not present

## 2019-01-03 DIAGNOSIS — M25662 Stiffness of left knee, not elsewhere classified: Secondary | ICD-10-CM | POA: Diagnosis not present

## 2019-01-03 DIAGNOSIS — S76112D Strain of left quadriceps muscle, fascia and tendon, subsequent encounter: Secondary | ICD-10-CM | POA: Diagnosis not present

## 2019-01-06 DIAGNOSIS — M25662 Stiffness of left knee, not elsewhere classified: Secondary | ICD-10-CM | POA: Diagnosis not present

## 2019-01-06 DIAGNOSIS — M25562 Pain in left knee: Secondary | ICD-10-CM | POA: Diagnosis not present

## 2019-01-06 DIAGNOSIS — M6281 Muscle weakness (generalized): Secondary | ICD-10-CM | POA: Diagnosis not present

## 2019-01-06 DIAGNOSIS — S76112D Strain of left quadriceps muscle, fascia and tendon, subsequent encounter: Secondary | ICD-10-CM | POA: Diagnosis not present

## 2019-01-11 DIAGNOSIS — M6281 Muscle weakness (generalized): Secondary | ICD-10-CM | POA: Diagnosis not present

## 2019-01-11 DIAGNOSIS — M25662 Stiffness of left knee, not elsewhere classified: Secondary | ICD-10-CM | POA: Diagnosis not present

## 2019-01-11 DIAGNOSIS — M25562 Pain in left knee: Secondary | ICD-10-CM | POA: Diagnosis not present

## 2019-01-11 DIAGNOSIS — S76112D Strain of left quadriceps muscle, fascia and tendon, subsequent encounter: Secondary | ICD-10-CM | POA: Diagnosis not present

## 2019-01-13 DIAGNOSIS — M6281 Muscle weakness (generalized): Secondary | ICD-10-CM | POA: Diagnosis not present

## 2019-01-13 DIAGNOSIS — S76112D Strain of left quadriceps muscle, fascia and tendon, subsequent encounter: Secondary | ICD-10-CM | POA: Diagnosis not present

## 2019-01-13 DIAGNOSIS — M25662 Stiffness of left knee, not elsewhere classified: Secondary | ICD-10-CM | POA: Diagnosis not present

## 2019-01-13 DIAGNOSIS — M25562 Pain in left knee: Secondary | ICD-10-CM | POA: Diagnosis not present

## 2019-01-16 DIAGNOSIS — M25662 Stiffness of left knee, not elsewhere classified: Secondary | ICD-10-CM | POA: Diagnosis not present

## 2019-01-16 DIAGNOSIS — M25562 Pain in left knee: Secondary | ICD-10-CM | POA: Diagnosis not present

## 2019-01-16 DIAGNOSIS — S76112D Strain of left quadriceps muscle, fascia and tendon, subsequent encounter: Secondary | ICD-10-CM | POA: Diagnosis not present

## 2019-01-16 DIAGNOSIS — M6281 Muscle weakness (generalized): Secondary | ICD-10-CM | POA: Diagnosis not present

## 2019-01-19 DIAGNOSIS — M25562 Pain in left knee: Secondary | ICD-10-CM | POA: Diagnosis not present

## 2019-01-19 DIAGNOSIS — M25662 Stiffness of left knee, not elsewhere classified: Secondary | ICD-10-CM | POA: Diagnosis not present

## 2019-01-19 DIAGNOSIS — S76112D Strain of left quadriceps muscle, fascia and tendon, subsequent encounter: Secondary | ICD-10-CM | POA: Diagnosis not present

## 2019-01-19 DIAGNOSIS — M6281 Muscle weakness (generalized): Secondary | ICD-10-CM | POA: Diagnosis not present

## 2019-01-23 DIAGNOSIS — M25662 Stiffness of left knee, not elsewhere classified: Secondary | ICD-10-CM | POA: Diagnosis not present

## 2019-01-23 DIAGNOSIS — M6281 Muscle weakness (generalized): Secondary | ICD-10-CM | POA: Diagnosis not present

## 2019-01-23 DIAGNOSIS — M25562 Pain in left knee: Secondary | ICD-10-CM | POA: Diagnosis not present

## 2019-01-23 DIAGNOSIS — S76112D Strain of left quadriceps muscle, fascia and tendon, subsequent encounter: Secondary | ICD-10-CM | POA: Diagnosis not present

## 2019-01-25 DIAGNOSIS — M6281 Muscle weakness (generalized): Secondary | ICD-10-CM | POA: Diagnosis not present

## 2019-01-25 DIAGNOSIS — S76112D Strain of left quadriceps muscle, fascia and tendon, subsequent encounter: Secondary | ICD-10-CM | POA: Diagnosis not present

## 2019-01-25 DIAGNOSIS — M25562 Pain in left knee: Secondary | ICD-10-CM | POA: Diagnosis not present

## 2019-01-25 DIAGNOSIS — M25561 Pain in right knee: Secondary | ICD-10-CM | POA: Diagnosis not present

## 2019-01-25 DIAGNOSIS — M25662 Stiffness of left knee, not elsewhere classified: Secondary | ICD-10-CM | POA: Diagnosis not present

## 2019-02-02 DIAGNOSIS — M6281 Muscle weakness (generalized): Secondary | ICD-10-CM | POA: Diagnosis not present

## 2019-02-02 DIAGNOSIS — M25662 Stiffness of left knee, not elsewhere classified: Secondary | ICD-10-CM | POA: Diagnosis not present

## 2019-02-02 DIAGNOSIS — M25562 Pain in left knee: Secondary | ICD-10-CM | POA: Diagnosis not present

## 2019-02-02 DIAGNOSIS — S76112D Strain of left quadriceps muscle, fascia and tendon, subsequent encounter: Secondary | ICD-10-CM | POA: Diagnosis not present

## 2019-02-03 ENCOUNTER — Inpatient Hospital Stay: Payer: Medicare Other | Attending: Oncology

## 2019-02-03 ENCOUNTER — Other Ambulatory Visit: Payer: Self-pay

## 2019-02-03 DIAGNOSIS — N289 Disorder of kidney and ureter, unspecified: Secondary | ICD-10-CM | POA: Diagnosis not present

## 2019-02-03 DIAGNOSIS — Z79899 Other long term (current) drug therapy: Secondary | ICD-10-CM | POA: Diagnosis not present

## 2019-02-03 DIAGNOSIS — D472 Monoclonal gammopathy: Secondary | ICD-10-CM | POA: Insufficient documentation

## 2019-02-03 LAB — CBC WITH DIFFERENTIAL (CANCER CENTER ONLY)
Abs Immature Granulocytes: 0.02 10*3/uL (ref 0.00–0.07)
Basophils Absolute: 0 10*3/uL (ref 0.0–0.1)
Basophils Relative: 1 %
Eosinophils Absolute: 0.1 10*3/uL (ref 0.0–0.5)
Eosinophils Relative: 1 %
HCT: 40.7 % (ref 39.0–52.0)
Hemoglobin: 13.5 g/dL (ref 13.0–17.0)
Immature Granulocytes: 0 %
Lymphocytes Relative: 27 %
Lymphs Abs: 1.5 10*3/uL (ref 0.7–4.0)
MCH: 29.5 pg (ref 26.0–34.0)
MCHC: 33.2 g/dL (ref 30.0–36.0)
MCV: 89.1 fL (ref 80.0–100.0)
Monocytes Absolute: 0.4 10*3/uL (ref 0.1–1.0)
Monocytes Relative: 8 %
Neutro Abs: 3.5 10*3/uL (ref 1.7–7.7)
Neutrophils Relative %: 63 %
Platelet Count: 213 10*3/uL (ref 150–400)
RBC: 4.57 MIL/uL (ref 4.22–5.81)
RDW: 12 % (ref 11.5–15.5)
WBC Count: 5.5 10*3/uL (ref 4.0–10.5)
nRBC: 0 % (ref 0.0–0.2)

## 2019-02-03 LAB — CMP (CANCER CENTER ONLY)
ALT: 16 U/L (ref 0–44)
AST: 14 U/L — ABNORMAL LOW (ref 15–41)
Albumin: 4.2 g/dL (ref 3.5–5.0)
Alkaline Phosphatase: 53 U/L (ref 38–126)
Anion gap: 8 (ref 5–15)
BUN: 16 mg/dL (ref 8–23)
CO2: 24 mmol/L (ref 22–32)
Calcium: 9.5 mg/dL (ref 8.9–10.3)
Chloride: 107 mmol/L (ref 98–111)
Creatinine: 1.86 mg/dL — ABNORMAL HIGH (ref 0.61–1.24)
GFR, Est AFR Am: 41 mL/min — ABNORMAL LOW (ref >60)
GFR, Estimated: 36 mL/min — ABNORMAL LOW (ref >60)
Glucose, Bld: 82 mg/dL (ref 70–99)
Potassium: 4.4 mmol/L (ref 3.5–5.1)
Sodium: 139 mmol/L (ref 135–145)
Total Bilirubin: 0.9 mg/dL (ref 0.3–1.2)
Total Protein: 7.7 g/dL (ref 6.5–8.1)

## 2019-02-06 LAB — KAPPA/LAMBDA LIGHT CHAINS
Kappa free light chain: 111.3 mg/L — ABNORMAL HIGH (ref 3.3–19.4)
Kappa, lambda light chain ratio: 12.1 — ABNORMAL HIGH (ref 0.26–1.65)
Lambda free light chains: 9.2 mg/L (ref 5.7–26.3)

## 2019-02-07 LAB — MULTIPLE MYELOMA PANEL, SERUM
Albumin SerPl Elph-Mcnc: 4.3 g/dL (ref 2.9–4.4)
Albumin/Glob SerPl: 1.5 (ref 0.7–1.7)
Alpha 1: 0.2 g/dL (ref 0.0–0.4)
Alpha2 Glob SerPl Elph-Mcnc: 0.6 g/dL (ref 0.4–1.0)
B-Globulin SerPl Elph-Mcnc: 0.8 g/dL (ref 0.7–1.3)
Gamma Glob SerPl Elph-Mcnc: 1.4 g/dL (ref 0.4–1.8)
Globulin, Total: 3 g/dL (ref 2.2–3.9)
IgA: 32 mg/dL — ABNORMAL LOW (ref 61–437)
IgG (Immunoglobin G), Serum: 1646 mg/dL — ABNORMAL HIGH (ref 603–1613)
IgM (Immunoglobulin M), Srm: 15 mg/dL (ref 15–143)
M Protein SerPl Elph-Mcnc: 1 g/dL — ABNORMAL HIGH
Total Protein ELP: 7.3 g/dL (ref 6.0–8.5)

## 2019-02-08 DIAGNOSIS — M6281 Muscle weakness (generalized): Secondary | ICD-10-CM | POA: Diagnosis not present

## 2019-02-08 DIAGNOSIS — M25562 Pain in left knee: Secondary | ICD-10-CM | POA: Diagnosis not present

## 2019-02-08 DIAGNOSIS — S76112D Strain of left quadriceps muscle, fascia and tendon, subsequent encounter: Secondary | ICD-10-CM | POA: Diagnosis not present

## 2019-02-08 DIAGNOSIS — M25662 Stiffness of left knee, not elsewhere classified: Secondary | ICD-10-CM | POA: Diagnosis not present

## 2019-02-10 ENCOUNTER — Other Ambulatory Visit: Payer: Self-pay

## 2019-02-10 ENCOUNTER — Inpatient Hospital Stay: Payer: Medicare Other | Admitting: Oncology

## 2019-02-10 VITALS — BP 117/68 | HR 73 | Temp 98.3°F | Resp 17 | Ht 68.25 in | Wt 185.0 lb

## 2019-02-10 DIAGNOSIS — N289 Disorder of kidney and ureter, unspecified: Secondary | ICD-10-CM | POA: Diagnosis not present

## 2019-02-10 DIAGNOSIS — Z79899 Other long term (current) drug therapy: Secondary | ICD-10-CM | POA: Diagnosis not present

## 2019-02-10 DIAGNOSIS — D472 Monoclonal gammopathy: Secondary | ICD-10-CM

## 2019-02-10 NOTE — Progress Notes (Signed)
Hematology and Oncology Follow Up Visit  Brad Brown [id number] 1945/12/29 73 y.o. 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  Current therapy: Active surveillance.  Interim History:  Brad Brown returns for a follow-up visit.  Since the last visit, he reports no major changes in his health.  He sustained an injury to left quadriceps tendon while he was mowing the lawn and required surgical repair that was completed on October 20, 2018.  Since that time, he is recovered reasonably well and ambulates without any major difficulties although uses a cane at this time.  His performance status to be level remains unchanged at this time.  Denies any bone pain or pathological fractures.  He denied any alteration mental status, neuropathy, confusion or dizziness.  Denies any headaches or lethargy.  Denies any night sweats, weight loss or changes in appetite.  Denied orthopnea, dyspnea on exertion or chest discomfort.  Denies shortness of breath, difficulty breathing hemoptysis or cough.  Denies any abdominal distention, nausea, early satiety or dyspepsia.  Denies any hematuria, frequency, dysuria or nocturia.  Denies any skin irritation, dryness or rash.  Denies any ecchymosis or petechiae.  Denies any lymphadenopathy or clotting.  Denies any heat or cold intolerance.  Denies any anxiety or depression.  Remaining review of system is negative.  .   Medications: Updated without any changes. Current Outpatient Medications  Medication Sig Dispense Refill  . acetaminophen (TYLENOL) 325 MG tablet Take 650 mg by mouth 4 (four) times daily as needed.    . baclofen (LIORESAL) 10 MG tablet Take 1 tablet (10 mg total) by mouth 2 (two) times daily as needed for muscle spasms. 20 each 0  . bisacodyl (DULCOLAX) 10 MG suppository Place 1 suppository (10 mg total) rectally daily as needed for  moderate constipation. 12 suppository 0  . ciclopirox (LOPROX) 0.77 % cream Apply 1 application topically 2 (two) times daily as needed (for rash).     . Clotrimazole 1 % OINT Apply 1 application topically daily. (Patient taking differently: Apply 1 application topically daily as needed (for rash). ) 30 g 0  . fluticasone (FLOVENT HFA) 110 MCG/ACT inhaler Inhale 1 puff into the lungs 2 (two) times daily. 1 Inhaler 12  . glipiZIDE (GLUCOTROL) 5 MG tablet Take 1 tablet (5 mg total) by mouth 2 (two) times daily before a meal. MUST SCHEDULE ANNUAL EXAM 180 tablet 0  . HYDROcodone-acetaminophen (NORCO) 5-325 MG tablet Take 1-2 tablets by mouth every 6 (six) hours as needed for severe pain (Take tylenol for mild and moderate pain). (Patient taking differently: Take 1 tablet by mouth 4 (four) times daily as needed for severe pain (Take tylenol for mild and moderate pain). ) 30 tablet 0  . lactulose (CHRONULAC) 10 GM/15ML solution Take 20 g by mouth 2 (two) times daily as needed for mild constipation.    . levETIRAcetam (KEPPRA) 500 MG tablet Take 500-1,000 mg by mouth See admin instructions. Take one tablet by mouth in the morning and two tablets by mouth at night    . losartan (COZAAR) 100 MG tablet TAKE 1 TABLET BY MOUTH EVERY DAY (Patient taking differently: Take 100 mg by mouth daily. ) 30 tablet 5  . metoprolol tartrate (LOPRESSOR) 25 MG tablet Take 12.5 mg by mouth 2 (two) times daily.    . Multiple Vitamin (MULTIVITAMIN) tablet Take 1 tablet by mouth daily.      Marland Kitchen  naftifine (NAFTIN) 1 % cream Apply 1 application topically 2 (two) times daily as needed (For  infection between toes).     . niacin 500 MG tablet Take 500 mg by mouth daily.    . Niacin, Antihyperlipidemic, 500 MG TABS Take 500 mg by mouth daily. 30 tablet 2  . NIFEdipine (PROCARDIA-XL/ADALAT CC) 30 MG 24 hr tablet Take 30 mg by mouth daily.    . Omega-3 Fatty Acids (FISH OIL) 1200 MG CAPS Take 1,200 mg by mouth daily.     . ondansetron  (ZOFRAN) 4 MG tablet Take 1 tablet (4 mg total) by mouth every 6 (six) hours as needed for nausea. 20 tablet 0  . polyethylene glycol (MIRALAX / GLYCOLAX) 17 g packet Take 17 g by mouth 2 (two) times daily. 14 each 0  . Rivaroxaban (XARELTO) 20 MG TABS tablet Take 20 mg by mouth daily.     Marland Kitchen senna (SENOKOT) 8.6 MG TABS tablet Take 1 tablet (8.6 mg total) by mouth 2 (two) times daily. 120 tablet 0  . simvastatin (ZOCOR) 20 MG tablet Take 10 mg by mouth at bedtime.     . SYMBICORT 160-4.5 MCG/ACT inhaler INHALE 2 PUFFS INTO THE LUNGS 2 (TWO) TIMES DAILY. 10 Inhaler 0  . tamsulosin (FLOMAX) 0.4 MG CAPS capsule Take 0.4 mg by mouth daily.  5   No current facility-administered medications for this visit.     Allergies:  Allergies  Allergen Reactions  . Lipitor [Atorvastatin] Rash    Past Medical History, Surgical history, Social history, and Family History without any changes on review.  Physical Exam: Blood pressure 117/68, pulse 73, temperature 98.3 F (36.8 C), temperature source Temporal, resp. rate 17, height 5' 8.25" (1.734 m), weight 185 lb (83.9 kg), SpO2 100 %.    ECOG: 0     General appearance: Alert, awake without any distress. Head: Atraumatic without abnormalities Oropharynx: Without any thrush or ulcers. Eyes: No scleral icterus. Lymph nodes: No lymphadenopathy noted in the cervical, supraclavicular, or axillary nodes Heart:regular rate and rhythm, without any murmurs or gallops.   Lung: Clear to auscultation without any rhonchi, wheezes or dullness to percussion. Abdomin: Soft, nontender without any shifting dullness or ascites. Musculoskeletal: No clubbing or cyanosis. Neurological: No motor or sensory deficits. Skin: No rashes or lesions. .     Lab Results: Lab Results  Component Value Date   WBC 5.5 02/03/2019   HGB 13.5 02/03/2019   HCT 40.7 02/03/2019   MCV 89.1 02/03/2019   PLT 213 02/03/2019     Chemistry      Component Value Date/Time   NA  139 02/03/2019 1106   NA 140 01/30/2016 1129   K 4.4 02/03/2019 1106   K 4.4 01/30/2016 1129   CL 107 02/03/2019 1106   CL 104 01/06/2012 1355   CO2 24 02/03/2019 1106   CO2 26 01/30/2016 1129   BUN 16 02/03/2019 1106   BUN 15.0 01/30/2016 1129   CREATININE 1.86 (H) 02/03/2019 1106   CREATININE 1.29 (H) 02/17/2016 1011   CREATININE 1.4 (H) 01/30/2016 1129      Component Value Date/Time   CALCIUM 9.5 02/03/2019 1106   CALCIUM 9.4 01/30/2016 1129   ALKPHOS 53 02/03/2019 1106   ALKPHOS 78 01/30/2016 1129   AST 14 (L) 02/03/2019 1106   AST 20 01/30/2016 1129   ALT 16 02/03/2019 1106   ALT 28 01/30/2016 1129   BILITOT 0.9 02/03/2019 1106   BILITOT 0.82 01/30/2016 1129  Results for Weisenburger, POPE BRUNTY (MRN [id number]) as of 02/10/2019 14:48  Ref. Range 02/04/2018 07:54 02/03/2019 11:06  M Protein SerPl Elph-Mcnc Latest Ref Range: Not Observed g/dL 1.0 (H) 1.0 (H)    Results for Wojdyla, LUKASZ ROGUS (MRN [id number]) as of 02/10/2019 14:48  Ref. Range 01/29/2017 09:47 02/04/2018 07:54 02/03/2019 11:06  IgG (Immunoglobin G), Serum Latest Ref Range: 603 - 1,613 mg/dL 1,631 (H) 1,493 1,646 (H)  .      Impression and Plan:  73 year old man with  1.  Monoclonal gammopathy of undetermined significance diagnosed in 2008.  He presented with IgG kappa subtype and has been on active surveillance since that time.  Protein studies obtained on 02/03/2019 was personally reviewed and discussed with the patient.  His M spike remains around 1 g/dL and has not changed for many years.  His IgG level mildly elevated but has been overall stable.  The natural course of this disease and risk of progression to symptomatic myeloma was discussed.  He continues to have risk of about 1 %/year without any indication for treatment at this time.  He is normal hemoglobin at this time with normal electrolytes.  2.  Renal insufficiency: Unrelated to plasma cell disorder with baseline kidney function  unchanged.  3.  Follow-up: He will return in 1 year for repeat follow-up.  15  minutes was spent with the patient face-to-face today.  More than 50% of time was spent on reviewing his disease status, treatment options and answering questions regarding future plan of care.     Zola Button, MD 11/20/20202:46 PM

## 2019-02-13 ENCOUNTER — Telehealth: Payer: Self-pay | Admitting: Oncology

## 2019-02-13 NOTE — Telephone Encounter (Signed)
Scheduled appt per 11/20 los.  Left a vm of the appt date and time.

## 2019-02-20 ENCOUNTER — Other Ambulatory Visit: Payer: Self-pay

## 2019-02-20 DIAGNOSIS — J449 Chronic obstructive pulmonary disease, unspecified: Secondary | ICD-10-CM

## 2019-02-20 MED ORDER — BUDESONIDE-FORMOTEROL FUMARATE 160-4.5 MCG/ACT IN AERO: 2.0000 | INHALATION_SPRAY | Freq: Two times a day (BID) | RESPIRATORY_TRACT | 1 refills | Status: DC

## 2019-02-22 DIAGNOSIS — M25562 Pain in left knee: Secondary | ICD-10-CM | POA: Diagnosis not present

## 2019-02-22 DIAGNOSIS — S76112D Strain of left quadriceps muscle, fascia and tendon, subsequent encounter: Secondary | ICD-10-CM | POA: Diagnosis not present

## 2019-02-22 DIAGNOSIS — M25662 Stiffness of left knee, not elsewhere classified: Secondary | ICD-10-CM | POA: Diagnosis not present

## 2019-02-22 DIAGNOSIS — M6281 Muscle weakness (generalized): Secondary | ICD-10-CM | POA: Diagnosis not present

## 2019-03-01 ENCOUNTER — Ambulatory Visit (INDEPENDENT_AMBULATORY_CARE_PROVIDER_SITE_OTHER): Payer: Medicare Other | Admitting: *Deleted

## 2019-03-01 DIAGNOSIS — Z95810 Presence of automatic (implantable) cardiac defibrillator: Secondary | ICD-10-CM | POA: Diagnosis not present

## 2019-03-01 LAB — CUP PACEART REMOTE DEVICE CHECK
Battery Remaining Longevity: 72 mo
Battery Remaining Percentage: 70 %
Battery Voltage: 2.98 V
Brady Statistic RV Percent Paced: 1 %
Date Time Interrogation Session: 20201209105253
HighPow Impedance: 48 Ohm
HighPow Impedance: 48 Ohm
Implantable Lead Implant Date: 20080527
Implantable Lead Location: 753860
Implantable Lead Model: 7121
Implantable Pulse Generator Implant Date: 20171201
Lead Channel Impedance Value: 440 Ohm
Lead Channel Pacing Threshold Amplitude: 1.25 V
Lead Channel Pacing Threshold Pulse Width: 0.5 ms
Lead Channel Sensing Intrinsic Amplitude: 9.9 mV
Lead Channel Setting Pacing Amplitude: 2.5 V
Lead Channel Setting Pacing Pulse Width: 0.5 ms
Lead Channel Setting Sensing Sensitivity: 0.5 mV
Pulse Gen Serial Number: 7283394

## 2019-03-07 ENCOUNTER — Telehealth: Payer: Self-pay

## 2019-03-07 NOTE — Telephone Encounter (Signed)
I let the pt know we did get his transmission and his next one is 05-31-2019. The pt thanked me for the phone call.

## 2019-03-07 NOTE — Telephone Encounter (Signed)
-----   Message from Elberta Fortis sent at 03/07/2019 12:16 PM EST ----- Regarding: Device Report Good Day,  Mr. Tellefsen left a message wanting to know if you received his Report.  Please call [phone].

## 2019-04-08 NOTE — Progress Notes (Signed)
ICD remote 

## 2019-04-24 ENCOUNTER — Encounter: Payer: Medicare Other | Admitting: Internal Medicine

## 2019-04-24 NOTE — Progress Notes (Deleted)
HPI:  Pt presents to the clinic today for his subsequent annual Medicare Wellness Exam. He is also due to follow up chronic conditions.  OA: Mainly in his hands. He does not take any medication for arthritis at this time.  Afib: Managed on Metoprolol and Xarelto. ECG from 09/2018 reviewed. He follows with Dr. Lovena Le.  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 He is taking Glipizide as prescribed. He is not on an ACEI/ARB. He does not monitor his sugars. He does not check his feet routinely. His last eye exam was.  HLD with CAD: His last LDL was 47, triglycerides 161. He denies myalgias on Simvasatin and Fish Oil. He does not consume a low fat diet.  COPD: He denies chronic or SOB. He is taking Symbicort as prescribed. There are no PFT's on file.  GERD: Triggered by spicy and tomato based foods. He does not take any medication for this OTC. There is no upper GI on file.  HTN: His BP today is. He is taking Losartan, Metoprolol and Nifedipine as prescribed. ECG from 09/2018 reviewed.  BPH: He reports urinary frequency and nocturia. He is taking Flomax as prescribed. He does not follow with a urologist.  Seizure Disorder: He denies recent seizures on Keppra. He does not follow with neurology.   History of Stroke: Mainly cognitive issues. He is taking Losartan, Metoprolol, Nifedipien, Xarelto and Simvastatin as prescribed. He does not follow with neurology.   Past Medical History:  Diagnosis Date  . Acute myocardial infarction ([place]) 06/1983  . Arthritis   . Atrial fibrillation ([place])   . Automatic implantable cardiac defibrillator in situ   . Borderline diabetes mellitus   . BPH without obstruction/lower urinary tract symptoms   . CAD (coronary artery disease)   . Chronic kidney disease, stage 1   . Colonic polyp 2004   hyperplastic   . COPD (chronic obstructive pulmonary disease) ([place])   . Diabetes mellitus without complication ([place])   . Fall   . GERD  (gastroesophageal reflux disease)   . History of renal insufficiency syndrome   . HTN (hypertension)   . Hyperlipidemia   . Knee derangement 09/2018   LEFT KNEE  . Monoclonal gammopathy   . 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 Seizure disorder ([place])   . Stroke [facility])    pt reports he has had 3    Current Outpatient Medications  Medication Sig Dispense Refill  . acetaminophen (TYLENOL) 325 MG tablet Take 650 mg by mouth 4 (four) times daily as needed.    . baclofen (LIORESAL) 10 MG tablet Take 1 tablet (10 mg total) by mouth 2 (two) times daily as needed for muscle spasms. 20 each 0  . bisacodyl (DULCOLAX) 10 MG suppository Place 1 suppository (10 mg total) rectally daily as needed for moderate constipation. 12 suppository 0  . budesonide-formoterol (SYMBICORT) 160-4.5 MCG/ACT inhaler Inhale 2 puffs into the lungs 2 (two) times daily. 10.2 g 1  . ciclopirox (LOPROX) 0.77 % cream Apply 1 application topically 2 (two) times daily as needed (for rash).     . Clotrimazole 1 % OINT Apply 1 application topically daily. (Patient taking differently: Apply 1 application topically daily as needed (for rash). ) 30 g 0  . glipiZIDE (GLUCOTROL) 5 MG tablet Take 1 tablet (5 mg total) by mouth 2 (two) times daily before a meal. MUST SCHEDULE ANNUAL EXAM 180 tablet 0  . HYDROcodone-acetaminophen (NORCO) 5-325 MG tablet  Take 1-2 tablets by mouth every 6 (six) hours as needed for severe pain (Take tylenol for mild and moderate pain). (Patient taking differently: Take 1 tablet by mouth 4 (four) times daily as needed for severe pain (Take tylenol for mild and moderate pain). ) 30 tablet 0  . lactulose (CHRONULAC) 10 GM/15ML solution Take 20 g by mouth 2 (two) times daily as needed for mild constipation.    . levETIRAcetam (KEPPRA) 500 MG tablet Take 500-1,000 mg by mouth See admin instructions. Take one tablet by mouth in the morning and two tablets by mouth at night    . losartan (COZAAR) 100  MG tablet TAKE 1 TABLET BY MOUTH EVERY DAY (Patient taking differently: Take 100 mg by mouth daily. ) 30 tablet 5  . metoprolol tartrate (LOPRESSOR) 25 MG tablet Take 12.5 mg by mouth 2 (two) times daily.    . Multiple Vitamin (MULTIVITAMIN) tablet Take 1 tablet by mouth daily.      . naftifine (NAFTIN) 1 % cream Apply 1 application topically 2 (two) times daily as needed (For  infection between toes).     . niacin 500 MG tablet Take 500 mg by mouth daily.    . Niacin, Antihyperlipidemic, 500 MG TABS Take 500 mg by mouth daily. 30 tablet 2  . NIFEdipine (PROCARDIA-XL/ADALAT CC) 30 MG 24 hr tablet Take 30 mg by mouth daily.    . Omega-3 Fatty Acids (FISH OIL) 1200 MG CAPS Take 1,200 mg by mouth daily.     . ondansetron (ZOFRAN) 4 MG tablet Take 1 tablet (4 mg total) by mouth every 6 (six) hours as needed for nausea. 20 tablet 0  . polyethylene glycol (MIRALAX / GLYCOLAX) 17 g packet Take 17 g by mouth 2 (two) times daily. 14 each 0  . Rivaroxaban (XARELTO) 20 MG TABS tablet Take 20 mg by mouth daily.     Marland Kitchen senna (SENOKOT) 8.6 MG TABS tablet Take 1 tablet (8.6 mg total) by mouth 2 (two) times daily. 120 tablet 0  . simvastatin (ZOCOR) 20 MG tablet Take 10 mg by mouth at bedtime.     . tamsulosin (FLOMAX) 0.4 MG CAPS capsule Take 0.4 mg by mouth daily.  5   No current facility-administered medications for this visit.    Allergies  Allergen Reactions  . Lipitor [Atorvastatin] Rash    Family History  Problem Relation Age of Onset  . Colon cancer Brother   . Heart attack Mother   . Heart attack Brother   . Seizures Neg Hx     Social History   Socioeconomic History  . Marital status: Single    Spouse name: Not on file  . Number of children: 1  . Years of education: Not on file  . Highest education level: Not on file  Occupational History  . Occupation: retired    Fish farm manager: RETIRED  Tobacco Use  . Smoking status: Former Smoker    Packs/day: 0.50    Years: 15.00    Pack years:  7.50    Types: Cigarettes    Quit date: 03/23/2004    Years since quitting: 15.0  . Smokeless tobacco: Never Used  Substance and Sexual Activity  . Alcohol use: Yes    Alcohol/week: 1.0 standard drinks    Types: 1 Glasses of wine per week    Comment: rare--wine  . Drug use: No  . Sexual activity: Not on file  Other Topics Concern  . Not on file  Social History Narrative  ICD-St. Jude  Remote-Yes      Live currently @ [facility] for rehabilitation. Plans to discharge to his daughter's house for continuation of rehab.   Social Determinants of Health   Financial Resource Strain:   . Difficulty of Paying Living Expenses: Not on file  Food Insecurity:   . Worried About Charity fundraiser in the Last Year: Not on file  . Ran Out of Food in the Last Year: Not on file  Transportation Needs:   . Lack of Transportation (Medical): Not on file  . Lack of Transportation (Non-Medical): Not on file  Physical Activity:   . Days of Exercise per Week: Not on file  . Minutes of Exercise per Session: Not on file  Stress:   . Feeling of Stress : Not on file  Social Connections:   . Frequency of Communication with Friends and Family: Not on file  . Frequency of Social Gatherings with Friends and Family: Not on file  . Attends Religious Services: Not on file  . Active Member of Clubs or Organizations: Not on file  . Attends Archivist Meetings: Not on file  . Marital Status: Not on file  Intimate Partner Violence:   . Fear of Current or Ex-Partner: Not on file  . Emotionally Abused: Not on file  . Physically Abused: Not on file  . Sexually Abused: Not on file    Hospitiliaztions: 09/2018, ruptured quadriceps tendon  Health Maintenance:    Flu:  11/2017  Tetanus: 08/2013  Pneumovax: 08/2013  Prevnar: 12/2015  Zostavax: never  Shingrix: never  PSA: 03/2018  Colon Screening: 07/2013  Eye Doctor:  Dental Exam:   Providers:   PCP: Webb Silversmith, NP-C              Cardiologist: Dr. Lovena Le             Oncologist: Dr. Alen Blew             Pulmonologist: Dr. Lenna Gilford  Orthopedics: Dr. Percell Miller    I have personally reviewed and have noted:  1. The patient's medical and social history 2. Their use of alcohol, tobacco or illicit drugs 3. Their current medications and supplements 4. The patient's functional ability including ADL's, fall risks, home safety risks and hearing or visual impairment. 5. Diet and physical activities 6. Evidence for depression or mood disorder  Subjective:   Review of Systems:   Constitutional: Denies fever, malaise, fatigue, headache or abrupt weight changes.  HEENT: Denies eye pain, eye redness, ear pain, ringing in the ears, wax buildup, runny nose, nasal congestion, bloody nose, or sore throat. Respiratory: Denies difficulty breathing, shortness of breath, cough or sputum production.   Cardiovascular: Denies chest pain, chest tightness, palpitations or swelling in the hands or feet.  Gastrointestinal: Denies abdominal pain, bloating, constipation, diarrhea or blood in the stool.  GU: Pt reports urinary frequency and nocturia. Denies urgency, pain with urination, burning sensation, blood in urine, odor or discharge. Musculoskeletal: Pt reports intermittent joint pain. Denies decrease in range of motion, difficulty with gait, muscle pain or joint swelling.  Skin: Denies redness, rashes, lesions or ulcercations.  Neurological: Pt reports difficulty with memory. Denies dizziness, difficulty with speech or problems with balance and coordination.  Psych: Denies anxiety, depression, SI/HI.  No other specific complaints in a complete review of systems (except as listed in HPI above).  Objective:  PE:    Wt Readings from Last 3 Encounters:  02/10/19 185 lb (83.9 kg)  12/26/18  184 lb (83.5 kg)  11/10/18 183 lb (83 kg)    General: Appears his stated age, well developed, well nourished in NAD. Skin: Warm, dry and intact. No  rashes, lesions or ulcerations noted. HEENT: Head: normal shape and size; Eyes: sclera white, no icterus, conjunctiva pink, PERRLA and EOMs intact; Ears: Tm's gray and intact, normal light reflex; Throat/Mouth: Teeth present, mucosa pink and moist, no exudate, lesions or ulcerations noted.  Neck: Neck supple, trachea midline. No masses, lumps or thyromegaly present.  Cardiovascular: Normal rate and rhythm. S1,S2 noted.  No murmur, rubs or gallops noted. No JVD or BLE edema. No carotid bruits noted. Pulmonary/Chest: Normal effort and positive vesicular breath sounds. No respiratory distress. No wheezes, rales or ronchi noted.  Abdomen: Soft and nontender. Normal bowel sounds. No distention or masses noted. Liver, spleen and kidneys non palpable. Musculoskeletal: Normal range of motion. Strength 5/5 BUE/BLE. No signs of joint swelling.  Neurological: Alert and oriented. Cranial nerves II-XII grossly intact. Coordination normal.  Psychiatric: Mood and affect normal. Behavior is normal. Judgment and thought content normal.     BMET    Component Value Date/Time   NA 139 02/03/2019 1106   NA 140 01/30/2016 1129   K 4.4 02/03/2019 1106   K 4.4 01/30/2016 1129   CL 107 02/03/2019 1106   CL 104 01/06/2012 1355   CO2 24 02/03/2019 1106   CO2 26 01/30/2016 1129   GLUCOSE 82 02/03/2019 1106   GLUCOSE 191 (H) 01/30/2016 1129   GLUCOSE 77 01/06/2012 1355   BUN 16 02/03/2019 1106   BUN 15.0 01/30/2016 1129   CREATININE 1.86 (H) 02/03/2019 1106   CREATININE 1.29 (H) 02/17/2016 1011   CREATININE 1.4 (H) 01/30/2016 1129   CALCIUM 9.5 02/03/2019 1106   CALCIUM 9.4 01/30/2016 1129   GFRNONAA 36 (L) 02/03/2019 1106   GFRAA 41 (L) 02/03/2019 1106    Lipid Panel     Component Value Date/Time   CHOL 124 04/21/2018 0942   TRIG 161.0 (H) 04/21/2018 0942   HDL 44.60 04/21/2018 0942   CHOLHDL 3 04/21/2018 0942   VLDL 32.2 04/21/2018 0942   LDLCALC 47 04/21/2018 0942    CBC    Component Value  Date/Time   WBC 5.5 02/03/2019 1106   WBC 9.3 10/21/2018 0609   RBC 4.57 02/03/2019 1106   HGB 13.5 02/03/2019 1106   HGB 13.5 01/29/2017 0948   HCT 40.7 02/03/2019 1106   HCT 40.9 01/29/2017 0948   PLT 213 02/03/2019 1106   PLT 187 01/29/2017 0948   MCV 89.1 02/03/2019 1106   MCV 91.7 01/29/2017 0948   MCH 29.5 02/03/2019 1106   MCHC 33.2 02/03/2019 1106   RDW 12.0 02/03/2019 1106   RDW 12.5 01/29/2017 0948   LYMPHSABS 1.5 02/03/2019 1106   LYMPHSABS 1.1 01/29/2017 0948   MONOABS 0.4 02/03/2019 1106   MONOABS 0.2 01/29/2017 0948   EOSABS 0.1 02/03/2019 1106   EOSABS 0.1 01/29/2017 0948   BASOSABS 0.0 02/03/2019 1106   BASOSABS 0.0 01/29/2017 0948    Hgb A1C Lab Results  Component Value Date   HGBA1C 6.1 04/21/2018      Assessment and Plan:   Medicare Annual Wellness Visit:  Diet: H Physical activity:  Depression/mood screen: Negative, PHQ 9 score of Hearing: Intact to whispered voice Visual acuity: Grossly normal, performs annual eye exam  ADLs: Capable Fall risk: None Home safety: Good Cognitive evaluation: Trouble with recall. Intact to orientation, naming, recall and repetition EOL planning: Harvin Hazel  directives, full code/ I agree  Preventative Medicine:   Next appointment:   Webb Silversmith, NP

## 2019-05-08 ENCOUNTER — Encounter: Payer: Self-pay | Admitting: Adult Health

## 2019-05-08 ENCOUNTER — Other Ambulatory Visit: Payer: Self-pay

## 2019-05-08 ENCOUNTER — Ambulatory Visit: Payer: Medicare Other | Admitting: Adult Health

## 2019-05-08 VITALS — BP 136/73 | HR 71 | Temp 96.9°F | Ht 69.0 in | Wt 195.0 lb

## 2019-05-08 DIAGNOSIS — R569 Unspecified convulsions: Secondary | ICD-10-CM | POA: Diagnosis not present

## 2019-05-08 DIAGNOSIS — Z8673 Personal history of transient ischemic attack (TIA), and cerebral infarction without residual deficits: Secondary | ICD-10-CM

## 2019-05-08 NOTE — Progress Notes (Addendum)
[facility]    Provider:  Dr Jaynee Eagles Requesting Provider: Jearld Fenton, NP Primary Care Provider:  Jearld Fenton, NP  CC:  Prior seizures  HPI:   Update 05/08/2019: Brad Brown is a 74 year old male who is being seen today for seizure follow-up previously seen on 11/02/2018 for initial evaluation with Dr. Jaynee Eagles after a fall not related to neurological cause.  Longstanding history of seizures which have been well controlled on Keppra managed by PCP.  He has been doing well without seizure activity and ongoing use of Keppra since prior visit.  Prior history of stroke and atrial fibrillation and continues on Xarelto and simvastatin for secondary stroke prevention.  Continues to follow with cardiology routinely and PCP.  Glucose levels have been stable. Blood pressure today 136/73.  No concerns at this time.    Initial consult visit 11/02/2018 Dr. Jaynee Eagles:   Brad Brown is a 74 y.o. male here from [facility].  He was seen in the hospital recently for a fall and was recommended that he follow-up here for his seizure history.  He has a past medical history of controlled seizures, stroke, hyperlipidemia, hypertension, fall, diabetes, COPD, chronic kidney disease, coronary artery disease, cardiac defibrillator in situ, A. fib, arthritis.  Patient was in the hospital and had a moment of confusion, it was brief and it was postop while he was on multiple medications, the stroke code was called and then canceled, he was evaluated by neurology who did not feel this was a stroke or seizure.  He was asked to follow-up outpatient.   He has a history of well controlled seizures on Keppra and hasn't had a seizure n years, recent mechanical fall not associated with seizures.II have no history I don;t even know who referred but patient says he has a history of seizures. He does not know why he is here. He says he was in the hospital and said he has a seizure. He has had seizures in the  past like 10 years ago. Years ago. He possibly saw Dr. Leonie Man we will try to find those notes and he has been on medications every since the keppra. He had a fall and he lost his balance and he tumbled down the hill and he ruptured his knee area and they questioned his Keppra but he has not had a seizure in years and is well controlled on the [name]. He is doing well. He denies any recent seizures this fall had nothing to do with seizures. Has had 3 seizures, consist of brief numbness and slurring of speech, lasted 10 minutes, no comfusion or urination on himself. He had an episode in the hospital of confusion but he was post surgery and it was felt medication induced. No workup was recommended by neurology inpatient.   Reviewed notes, labs and imaging from outside physicians, which showed:  I reviewed MRI of the brain report 2007 which showed an infarct in the left frontal lobe involving the cortex and white matter, a smaller acute infarct in the left posterior temporal lobe in the left MCA distribution, small chronic infarcts in the inferior cerebellum bilaterally.  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  I reviewed labs from July and August 2020, B12 400, CMP with elevated glucose and elevated creatinine, CBC with anemia  I reviewed the hospital discharge summary July 2020, patient presented with  left knee pain and swelling after falling downhill mowing his lawn and landing on his left leg, admitted for left quadriceps tendon rupture and surgery.  Patient did well postop however rapid response was called because he became altered, neurology was called and neurologist felt symptoms resolved quickly and did not think that this was a stroke or a seizure, considering pain medications and postop status this is likely the cause.  No neurologic deficits and back to  baseline.    Review of Systems: Patient complains of symptoms per HPI and no other concerns. Pertinent negatives and positives per HPI.    Social History   Socioeconomic History  . Marital status: Single    Spouse name: Not on file  . Number of children: 1  . Years of education: Not on file  . Highest education level: Not on file  Occupational History  . Occupation: retired    Fish farm manager: RETIRED  Tobacco Use  . Smoking status: Former Smoker    Packs/day: 0.50    Years: 15.00    Pack years: 7.50    Types: Cigarettes    Quit date: 03/23/2004    Years since quitting: 15.1  . Smokeless tobacco: Never Used  Substance and Sexual Activity  . Alcohol use: Yes    Alcohol/week: 1.0 standard drinks    Types: 1 Glasses of wine per week    Comment: rare--wine  . Drug use: No  . Sexual activity: Not on file  Other Topics Concern  . Not on file  Social History Narrative   ICD-St. Jude  Remote-Yes      Live currently @ [facility] for rehabilitation. Plans to discharge to his daughter's house for continuation of rehab.   Social Determinants of Health   Financial Resource Strain:   . Difficulty of Paying Living Expenses: Not on file  Food Insecurity:   . Worried About Charity fundraiser in the Last Year: Not on file  . Ran Out of Food in the Last Year: Not on file  Transportation Needs:   . Lack of Transportation (Medical): Not on file  . Lack of Transportation (Non-Medical): Not on file  Physical Activity:   . Days of Exercise per Week: Not on file  . Minutes of Exercise per Session: Not on file  Stress:   . Feeling of Stress : Not on file  Social Connections:   . Frequency of Communication with Friends and Family: Not on file  . Frequency of Social Gatherings with Friends and Family: Not on file  . Attends Religious Services: Not on file  . Active Member of Clubs or Organizations: Not on file  . Attends Archivist Meetings: Not on file  . Marital Status: Not  on file  Intimate Partner Violence:   . Fear of Current or Ex-Partner: Not on file  . Emotionally Abused: Not on file  . Physically Abused: Not on file  . Sexually Abused: Not on file    Family History  Problem Relation Age of Onset  . Colon cancer Brother   . Heart attack Mother   . Heart attack Brother   . Seizures Neg Hx     Past Medical History:  Diagnosis Date  . Acute myocardial infarction ([place]) 06/1983  . Arthritis   . Atrial fibrillation ([place])   . Automatic implantable cardiac defibrillator in situ   . Borderline diabetes mellitus   . BPH without obstruction/lower urinary tract symptoms   . CAD (coronary artery disease)   .  Chronic kidney disease, stage 1   . Colonic polyp 2004   hyperplastic   . COPD (chronic obstructive pulmonary disease) ([place])   . Diabetes mellitus without complication ([place])   . Fall   . GERD (gastroesophageal reflux disease)   . History of renal insufficiency syndrome   . HTN (hypertension)   . Hyperlipidemia   . Knee derangement 09/2018   LEFT KNEE  . Monoclonal gammopathy   . 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 Seizure disorder ([place])   . Stroke [facility])    pt reports he has had 3    Patient Active Problem List   Diagnosis Date Noted  . BPH (benign prostatic hyperplasia) 04/09/2016  . GERD (gastroesophageal reflux disease) 08/27/2013  . Arthritis 08/27/2013  . Family hx of colon cancer 06/26/2013  . Atrial fibrillation ([place]) 06/09/2012  . Brugada syndrome 08/05/2011  . MGUS (monoclonal gammopathy of unknown significance) 08/05/2011  . Hypertension 08/05/2010  . MI 07/24/2008  . Automatic implantable cardioverter-defibrillator in situ 07/24/2008  . Type 2 diabetes mellitus with stage 1 chronic kidney disease, without long-term current use of insulin ([place]) 02/12/2008  . Coronary atherosclerosis 03/29/2007  . HLD (hyperlipidemia) 02/08/2007  . Cerebral artery occlusion with cerebral infarction ([place]) 02/08/2007  .  COPD (chronic obstructive pulmonary disease) ([place]) 02/08/2007  . Seizures ([place]) 02/08/2007    Past Surgical History:  Procedure Laterality Date  . CARDIAC CATHETERIZATION  1987   Showed distal left circumflex 100% occluded   . CARDIAC DEFIBRILLATOR PLACEMENT  08/17/2006   Implantation of a St. Jude single chamber defibrillator  . EP IMPLANTABLE DEVICE N/A 02/21/2016   Procedure: ICD Generator Changeout;  Surgeon: Evans Lance, MD;  Location: [facility];  Service: Cardiovascular;  Laterality: N/A;  . INGUINAL HERNIA REPAIR Left   . QUADRICEPS TENDON REPAIR Left 10/20/2018   Procedure: REPAIR QUADRICEP TENDON;  Surgeon: Renette Butters, MD;  Location: [facility];  Service: Orthopedics;  Laterality: Left;    Current Outpatient Medications  Medication Sig Dispense Refill  . acetaminophen (TYLENOL) 325 MG tablet Take 650 mg by mouth 4 (four) times daily as needed.    . baclofen (LIORESAL) 10 MG tablet Take 1 tablet (10 mg total) by mouth 2 (two) times daily as needed for muscle spasms. 20 each 0  . bisacodyl (DULCOLAX) 10 MG suppository Place 1 suppository (10 mg total) rectally daily as needed for moderate constipation. 12 suppository 0  . budesonide-formoterol (SYMBICORT) 160-4.5 MCG/ACT inhaler Inhale 2 puffs into the lungs 2 (two) times daily. 10.2 g 1  . ciclopirox (LOPROX) 0.77 % cream Apply 1 application topically 2 (two) times daily as needed (for rash).     . Clotrimazole 1 % OINT Apply 1 application topically daily. (Patient taking differently: Apply 1 application topically daily as needed (for rash). ) 30 g 0  . glipiZIDE (GLUCOTROL) 5 MG tablet Take 1 tablet (5 mg total) by mouth 2 (two) times daily before a meal. MUST SCHEDULE ANNUAL EXAM 180 tablet 0  . HYDROcodone-acetaminophen (NORCO) 5-325 MG tablet Take 1-2 tablets by mouth every 6 (six) hours as needed for severe pain (Take tylenol for mild and moderate pain). (Patient taking differently: Take 1 tablet by mouth 4 (four)  times daily as needed for severe pain (Take tylenol for mild and moderate pain). ) 30 tablet 0  . lactulose (CHRONULAC) 10 GM/15ML solution Take 20 g by mouth 2 (two) times daily as needed for mild constipation.    Marland Kitchen  levETIRAcetam (KEPPRA) 500 MG tablet Take 500-1,000 mg by mouth See admin instructions. Take one tablet by mouth in the morning and two tablets by mouth at night    . losartan (COZAAR) 100 MG tablet TAKE 1 TABLET BY MOUTH EVERY DAY (Patient taking differently: Take 100 mg by mouth daily. ) 30 tablet 5  . metoprolol tartrate (LOPRESSOR) 25 MG tablet Take 12.5 mg by mouth 2 (two) times daily.    . Multiple Vitamin (MULTIVITAMIN) tablet Take 1 tablet by mouth daily.      . naftifine (NAFTIN) 1 % cream Apply 1 application topically 2 (two) times daily as needed (For  infection between toes).     . niacin 500 MG tablet Take 500 mg by mouth daily.    . Niacin, Antihyperlipidemic, 500 MG TABS Take 500 mg by mouth daily. 30 tablet 2  . NIFEdipine (PROCARDIA-XL/ADALAT CC) 30 MG 24 hr tablet Take 30 mg by mouth daily.    . Omega-3 Fatty Acids (FISH OIL) 1200 MG CAPS Take 1,200 mg by mouth daily.     . ondansetron (ZOFRAN) 4 MG tablet Take 1 tablet (4 mg total) by mouth every 6 (six) hours as needed for nausea. 20 tablet 0  . polyethylene glycol (MIRALAX / GLYCOLAX) 17 g packet Take 17 g by mouth 2 (two) times daily. 14 each 0  . Rivaroxaban (XARELTO) 20 MG TABS tablet Take 20 mg by mouth daily.     Marland Kitchen senna (SENOKOT) 8.6 MG TABS tablet Take 1 tablet (8.6 mg total) by mouth 2 (two) times daily. 120 tablet 0  . simvastatin (ZOCOR) 20 MG tablet Take 10 mg by mouth at bedtime.     . tamsulosin (FLOMAX) 0.4 MG CAPS capsule Take 0.4 mg by mouth daily.  5   No current facility-administered medications for this visit.    Allergies as of 05/08/2019 - Review Complete 12/26/2018  Allergen Reaction Noted  . Lipitor [atorvastatin] Rash 02/06/2016      Physical exam:  Today's Vitals   05/08/19  1326  BP: 136/73  Pulse: 71  Temp: (!) 96.9 F (36.1 C)  Weight: 195 lb (88.5 kg)  Height: 5\' 9"  (1.753 m)   Body mass index is 28.8 kg/m.   Exam: General: well developed, well nourished,  pleasant elderly male, seated, in no evident distress Head: head normocephalic and atraumatic.   Neck: supple with no carotid or supraclavicular bruits Cardiovascular: regular rate and rhythm, no murmurs Musculoskeletal: no deformity Skin:  no rash/petichiae Vascular:  Normal pulses all extremities   Neurologic Exam Mental Status: Awake and fully alert.   Normal speech and language.  Oriented to place and time. Recent and remote memory intact. Attention span, concentration and fund of knowledge appropriate. Mood and affect appropriate.  Cranial Nerves: Pupils equal, briskly reactive to light. Extraocular movements full without nystagmus. Visual fields full to confrontation. Hearing intact. Facial sensation intact. Face, tongue, palate moves normally and symmetrically.  Motor: Normal bulk and tone. Normal strength in all tested extremity muscles. Sensory.: intact to touch , pinprick , position and vibratory sensation.  Coordination: Rapid alternating movements normal in all extremities. Finger-to-nose and heel-to-shin performed accurately bilaterally. Gait and Station: Arises from chair without difficulty. Stance is normal. Gait demonstrates normal stride length and balance without use of assistive device Reflexes: 1+ and symmetric. Toes downgoing.      Assessment/Plan:  74 y.o. male who is being seen today for follow-up.  He was seen in the hospital in 09/2018 for  a fall and was recommended that he follow-up here for his seizure history.  He has a past medical history of controlled seizures, stroke, hyperlipidemia, hypertension, diabetes, COPD, chronic kidney disease, coronary artery disease, cardiac defibrillator in situ, A. fib, and arthritis.  Patient was in the hospital and had a moment of  confusion, it was brief and it was postop while he was on multiple medications, the stroke code was called and then canceled, he was evaluated by neurology who did not feel this was a stroke or seizure.     Continue Xarelto and pravastatin for secondary stroke prevention  Continue on Keppra for seizure prophylaxis with ongoing monitoring and management by PCP Continue to follow with PCP for HTN, HLD, DM and seizure management Continue to follow with cardiology for atrial fibrillation monitoring and management with ongoing use of Xarelto maintain strict control of hypertension with blood pressure goal below 130/90, diabetes with hemoglobin A1c goal below 6.5% and lipids with LDL cholesterol goal below 70 mg/dL. Recommend she see her primary MD to get prescription. I also advised the patient to eat a healthy diet with plenty of whole grains, cereals, fruits and vegetables, exercise regularly and maintain ideal body weight .  Patient will follow-up as needed as no indication for ongoing routine visits.  He was advised to call office with any questions or concerns in the future   Greater than 50% of time during this 20 minute visit was spent on counseling, discussion regarding history of longstanding controlled seizures and prior stroke, ongoing use of Keppra managed by PCP, reviewing risk factor management of HTN, HLD, DM and atrial fibrillation, planning of further management, discussion with patient answering all questions to satisfaction    Frann Rider, AGNP-BC  [facility] [address] [place],  [address]  Phone [phone] Fax [phone] Note: This document was prepared with digital dictation and possible smart phrase technology. Any transcriptional errors that result from this process are unintentional.   Made any corrections needed, and agree with history, physical, neuro exam,assessment and plan as stated.     Sarina Ill, MD [facility]

## 2019-05-29 DIAGNOSIS — I129 Hypertensive chronic kidney disease with stage 1 through stage 4 chronic kidney disease, or unspecified chronic kidney disease: Secondary | ICD-10-CM | POA: Diagnosis not present

## 2019-05-29 DIAGNOSIS — I480 Paroxysmal atrial fibrillation: Secondary | ICD-10-CM | POA: Diagnosis not present

## 2019-05-29 DIAGNOSIS — D472 Monoclonal gammopathy: Secondary | ICD-10-CM | POA: Diagnosis not present

## 2019-05-29 DIAGNOSIS — N182 Chronic kidney disease, stage 2 (mild): Secondary | ICD-10-CM | POA: Diagnosis not present

## 2019-05-29 DIAGNOSIS — N1831 Chronic kidney disease, stage 3a: Secondary | ICD-10-CM | POA: Diagnosis not present

## 2019-05-30 ENCOUNTER — Other Ambulatory Visit: Payer: Self-pay

## 2019-05-30 ENCOUNTER — Encounter: Payer: Self-pay | Admitting: Internal Medicine

## 2019-05-30 ENCOUNTER — Ambulatory Visit (INDEPENDENT_AMBULATORY_CARE_PROVIDER_SITE_OTHER): Payer: Medicare Other | Admitting: Internal Medicine

## 2019-05-30 VITALS — BP 126/78 | HR 62 | Temp 97.4°F | Ht 68.5 in | Wt 194.0 lb

## 2019-05-30 DIAGNOSIS — E1122 Type 2 diabetes mellitus with diabetic chronic kidney disease: Secondary | ICD-10-CM | POA: Diagnosis not present

## 2019-05-30 DIAGNOSIS — N181 Chronic kidney disease, stage 1: Secondary | ICD-10-CM | POA: Diagnosis not present

## 2019-05-30 DIAGNOSIS — Z0000 Encounter for general adult medical examination without abnormal findings: Secondary | ICD-10-CM | POA: Diagnosis not present

## 2019-05-30 DIAGNOSIS — E782 Mixed hyperlipidemia: Secondary | ICD-10-CM | POA: Diagnosis not present

## 2019-05-30 DIAGNOSIS — I6350 Cerebral infarction due to unspecified occlusion or stenosis of unspecified cerebral artery: Secondary | ICD-10-CM | POA: Diagnosis not present

## 2019-05-30 DIAGNOSIS — I4821 Permanent atrial fibrillation: Secondary | ICD-10-CM | POA: Diagnosis not present

## 2019-05-30 DIAGNOSIS — Z125 Encounter for screening for malignant neoplasm of prostate: Secondary | ICD-10-CM

## 2019-05-30 DIAGNOSIS — K219 Gastro-esophageal reflux disease without esophagitis: Secondary | ICD-10-CM | POA: Diagnosis not present

## 2019-05-30 DIAGNOSIS — R350 Frequency of micturition: Secondary | ICD-10-CM

## 2019-05-30 DIAGNOSIS — Z1211 Encounter for screening for malignant neoplasm of colon: Secondary | ICD-10-CM | POA: Diagnosis not present

## 2019-05-30 DIAGNOSIS — M1990 Unspecified osteoarthritis, unspecified site: Secondary | ICD-10-CM

## 2019-05-30 DIAGNOSIS — N401 Enlarged prostate with lower urinary tract symptoms: Secondary | ICD-10-CM | POA: Diagnosis not present

## 2019-05-30 DIAGNOSIS — J411 Mucopurulent chronic bronchitis: Secondary | ICD-10-CM | POA: Diagnosis not present

## 2019-05-30 DIAGNOSIS — I2510 Atherosclerotic heart disease of native coronary artery without angina pectoris: Secondary | ICD-10-CM

## 2019-05-30 DIAGNOSIS — R569 Unspecified convulsions: Secondary | ICD-10-CM

## 2019-05-30 DIAGNOSIS — I10 Essential (primary) hypertension: Secondary | ICD-10-CM

## 2019-05-30 DIAGNOSIS — D472 Monoclonal gammopathy: Secondary | ICD-10-CM

## 2019-05-30 LAB — LIPID PANEL
Cholesterol: 134 mg/dL (ref 0–200)
HDL: 42 mg/dL (ref >39.00)
NonHDL: 92.4
Total CHOL/HDL Ratio: 3
Triglycerides: 252 mg/dL — ABNORMAL HIGH (ref 0.0–149.0)
VLDL: 50.4 mg/dL — ABNORMAL HIGH (ref 0.0–40.0)

## 2019-05-30 LAB — COMPREHENSIVE METABOLIC PANEL
ALT: 15 U/L (ref 0–53)
AST: 13 U/L (ref 0–37)
Albumin: 4.2 g/dL (ref 3.5–5.2)
Alkaline Phosphatase: 58 U/L (ref 39–117)
BUN: 15 mg/dL (ref 6–23)
CO2: 28 mEq/L (ref 19–32)
Calcium: 9.4 mg/dL (ref 8.4–10.5)
Chloride: 103 mEq/L (ref 96–112)
Creatinine, Ser: 1.44 mg/dL (ref 0.40–1.50)
GFR: 58.45 mL/min — ABNORMAL LOW (ref >60.00)
Glucose, Bld: 112 mg/dL — ABNORMAL HIGH (ref 70–99)
Potassium: 4.6 mEq/L (ref 3.5–5.1)
Sodium: 135 mEq/L (ref 135–145)
Total Bilirubin: 0.7 mg/dL (ref 0.2–1.2)
Total Protein: 7.3 g/dL (ref 6.0–8.3)

## 2019-05-30 LAB — CBC
HCT: 40.6 % (ref 39.0–52.0)
Hemoglobin: 13.8 g/dL (ref 13.0–17.0)
MCHC: 34.1 g/dL (ref 30.0–36.0)
MCV: 91.7 fl (ref 78.0–100.0)
Platelets: 188 10*3/uL (ref 150.0–400.0)
RBC: 4.43 Mil/uL (ref 4.22–5.81)
RDW: 12.7 % (ref 11.5–15.5)
WBC: 5.2 10*3/uL (ref 4.0–10.5)

## 2019-05-30 LAB — LDL CHOLESTEROL, DIRECT: Direct LDL: 34 mg/dL

## 2019-05-30 LAB — PSA, MEDICARE: PSA: 0.78 ng/ml (ref 0.10–4.00)

## 2019-05-30 LAB — HEMOGLOBIN A1C: Hgb A1c MFr Bld: 6.2 % (ref 4.6–6.5)

## 2019-05-30 NOTE — Progress Notes (Signed)
Subjective:    Patient ID: Brad Brown, male    DOB: Nov 03, 1945, 74 y.o.   MRN: [id number]  HPI  Pt presents to the clinic today for his subsequent annual Medicare Wellness Exam. He is also due to follow up chronic conditions.  Arthritis: Mainly in his hands. He does not take any ASA 81 mg as needed OTC for this.  Afib: Managed on Nifedipine, Metoprolol and Xarelto. He follows with Dr. Lovena Le. ECG from 09/2018 reviewed.  DM 2 with CKD: His last A1C was 6.1, 03/2018. His last creatinine 1.86, GFR 36, 01/2019. He is taking Glipizide as prescribed. He does not monitor his sugars. He does not check his feet routinely. His last eye exam was within the last year.  HLD with CAD: His last LDL was 47, triglycerides 161, 03/2018. He denies myalgias on Simvastatin and Fish Oil. He does not consume a low fat diet.  COPD: He denies chronic cough or SOB. He is taking Symbicort as prescribed. There are no PFT's on file.   GERD: Triggered by spicy and tomato paste foods. He does not take any medication for this OTC. There is no upper GI on file.   HTN: His BP today is 126/78. He is taking Losartan, Metoprolol and Nifedipine as prescribed. ECG from 09/2018 reviewed.  BPH: He reports urinary frequency and nocturia. He is taking Flomax as prescribed. He is following with urology.  Seizure Disorder: He reports seizure 1 month,  on Keppra. He follows with neurology.   History of Stroke: Residual memory issues but no weakness. He is taking Losartan, Metoprolol, Nifedipine, Xarelto and Simvastatin as prescribed. He is following with neurology.  Multiple Myeloma: No treatment. He follows with the cancer center.  Past Medical History:  Diagnosis Date  . Acute myocardial infarction ([place]) 06/1983  . Arthritis   . Atrial fibrillation ([place])   . Automatic implantable cardiac defibrillator in situ   . Borderline diabetes mellitus   . BPH without obstruction/lower urinary tract symptoms   . CAD (coronary  artery disease)   . Chronic kidney disease, stage 1   . Colonic polyp 2004   hyperplastic   . COPD (chronic obstructive pulmonary disease) ([place])   . Diabetes mellitus without complication ([place])   . Fall   . GERD (gastroesophageal reflux disease)   . History of renal insufficiency syndrome   . HTN (hypertension)   . Hyperlipidemia   . Knee derangement 09/2018   LEFT KNEE  . Monoclonal gammopathy   . 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 Seizure disorder ([place])   . Stroke [facility])    pt reports he has had 3    Current Outpatient Medications  Medication Sig Dispense Refill  . acetaminophen (TYLENOL) 325 MG tablet Take 650 mg by mouth 4 (four) times daily as needed.    . baclofen (LIORESAL) 10 MG tablet Take 1 tablet (10 mg total) by mouth 2 (two) times daily as needed for muscle spasms. 20 each 0  . bisacodyl (DULCOLAX) 10 MG suppository Place 1 suppository (10 mg total) rectally daily as needed for moderate constipation. 12 suppository 0  . budesonide-formoterol (SYMBICORT) 160-4.5 MCG/ACT inhaler Inhale 2 puffs into the lungs 2 (two) times daily. 10.2 g 1  . ciclopirox (LOPROX) 0.77 % cream Apply 1 application topically 2 (two) times daily as needed (for rash).     . Clotrimazole 1 % OINT Apply 1 application topically daily. (Patient taking differently: Apply 1 application topically daily as needed (  for rash). ) 30 g 0  . glipiZIDE (GLUCOTROL) 5 MG tablet Take 1 tablet (5 mg total) by mouth 2 (two) times daily before a meal. MUST SCHEDULE ANNUAL EXAM 180 tablet 0  . HYDROcodone-acetaminophen (NORCO) 5-325 MG tablet Take 1-2 tablets by mouth every 6 (six) hours as needed for severe pain (Take tylenol for mild and moderate pain). (Patient taking differently: Take 1 tablet by mouth 4 (four) times daily as needed for severe pain (Take tylenol for mild and moderate pain). ) 30 tablet 0  . lactulose (CHRONULAC) 10 GM/15ML solution Take 20 g by mouth 2 (two) times daily as needed  for mild constipation.    . levETIRAcetam (KEPPRA) 500 MG tablet Take 500-1,000 mg by mouth See admin instructions. Take one tablet by mouth in the morning and two tablets by mouth at night    . losartan (COZAAR) 100 MG tablet TAKE 1 TABLET BY MOUTH EVERY DAY (Patient taking differently: Take 100 mg by mouth daily. ) 30 tablet 5  . metoprolol tartrate (LOPRESSOR) 25 MG tablet Take 12.5 mg by mouth 2 (two) times daily.    . Multiple Vitamin (MULTIVITAMIN) tablet Take 1 tablet by mouth daily.      . naftifine (NAFTIN) 1 % cream Apply 1 application topically 2 (two) times daily as needed (For  infection between toes).     . Niacin, Antihyperlipidemic, 500 MG TABS Take 500 mg by mouth daily. 30 tablet 2  . NIFEdipine (PROCARDIA-XL/ADALAT CC) 30 MG 24 hr tablet Take 30 mg by mouth daily. "PRN"    . Omega-3 Fatty Acids (FISH OIL) 1200 MG CAPS Take 1,200 mg by mouth daily.     . ondansetron (ZOFRAN) 4 MG tablet Take 1 tablet (4 mg total) by mouth every 6 (six) hours as needed for nausea. 20 tablet 0  . polyethylene glycol (MIRALAX / GLYCOLAX) 17 g packet Take 17 g by mouth 2 (two) times daily. 14 each 0  . Rivaroxaban (XARELTO) 20 MG TABS tablet Take 20 mg by mouth daily.     Marland Kitchen senna (SENOKOT) 8.6 MG TABS tablet Take 1 tablet (8.6 mg total) by mouth 2 (two) times daily. 120 tablet 0  . simvastatin (ZOCOR) 20 MG tablet Take 10 mg by mouth at bedtime.     . tamsulosin (FLOMAX) 0.4 MG CAPS capsule Take 0.4 mg by mouth daily.  5   No current facility-administered medications for this visit.    Allergies  Allergen Reactions  . Lipitor [Atorvastatin] Rash    Family History  Problem Relation Age of Onset  . Colon cancer Brother   . Heart attack Mother   . Heart attack Brother   . Seizures Neg Hx     Social History   Socioeconomic History  . Marital status: Single    Spouse name: Not on file  . Number of children: 1  . Years of education: Not on file  . Highest education level: Not on file   Occupational History  . Occupation: retired    Fish farm manager: RETIRED  Tobacco Use  . Smoking status: Former Smoker    Packs/day: 0.50    Years: 15.00    Pack years: 7.50    Types: Cigarettes    Quit date: 03/23/2004    Years since quitting: 15.1  . Smokeless tobacco: Never Used  Substance and Sexual Activity  . Alcohol use: Yes    Alcohol/week: 1.0 standard drinks    Types: 1 Glasses of wine per week  Comment: rare--wine  . Drug use: No  . Sexual activity: Not on file  Other Topics Concern  . Not on file  Social History Narrative   ICD-St. Jude  Remote-Yes      Live currently @ [facility] for rehabilitation. Plans to discharge to his daughter's house for continuation of rehab.   Social Determinants of Health   Financial Resource Strain:   . Difficulty of Paying Living Expenses: Not on file  Food Insecurity:   . Worried About Charity fundraiser in the Last Year: Not on file  . Ran Out of Food in the Last Year: Not on file  Transportation Needs:   . Lack of Transportation (Medical): Not on file  . Lack of Transportation (Non-Medical): Not on file  Physical Activity:   . Days of Exercise per Week: Not on file  . Minutes of Exercise per Session: Not on file  Stress:   . Feeling of Stress : Not on file  Social Connections:   . Frequency of Communication with Friends and Family: Not on file  . Frequency of Social Gatherings with Friends and Family: Not on file  . Attends Religious Services: Not on file  . Active Member of Clubs or Organizations: Not on file  . Attends Archivist Meetings: Not on file  . Marital Status: Not on file  Intimate Partner Violence:   . Fear of Current or Ex-Partner: Not on file  . Emotionally Abused: Not on file  . Physically Abused: Not on file  . Sexually Abused: Not on file    Hospitiliaztions: 09/2018, 10/2018  Health Maintenance:    Flu: 11/2018  Tetanus: 08/2013  Pneumovax: 08/2013  Prevnar: 12/2015  Zostavax:  unsure  Shingrix: unsure  Covid: 05/20/19  PSA: 03/2018  Colon Screening: 07/2013  Eye Doctor: annually  Dental Exam: biannually   Providers:   PCP: Webb Silversmith, NP-C             Cardiologist: Dr. Lovena Le             Oncologist: Dr. Alen Blew             Pulmonologist: Dr. Lenna Gilford  Neurology: Dr. Jaynee Eagles   I have personally reviewed and have noted:  1. The patient's medical and social history 2. Their use of alcohol, tobacco or illicit drugs 3. Their current medications and supplements 4. The patient's functional ability including ADL's, fall risks, home safety risks and hearing or visual impairment. 5. Diet and physical activities 6. Evidence for depression or mood disorder  Subjective:   Review of Systems:   Constitutional: Denies fever, malaise, fatigue, headache or abrupt weight changes.  HEENT: Denies eye pain, eye redness, ear pain, ringing in the ears, wax buildup, runny nose, nasal congestion, bloody nose, or sore throat. Respiratory: Denies difficulty breathing, shortness of breath, cough or sputum production.   Cardiovascular: Pt reports intermittent palpitations. Denies chest pain, chest tightness, or swelling in the hands or feet.  Gastrointestinal: Denies abdominal pain, bloating, constipation, diarrhea or blood in the stool.  GU: Pt reports urinary frequency and nocturia. Denies urgency, pain with urination, burning sensation, blood in urine, odor or discharge. Musculoskeletal: Pt reports intermittent joint pains. Denies decrease in range of motion, difficulty with gait, muscle pain or joint swelling.  Skin: Denies redness, rashes, lesions or ulcercations.  Neurological: Pt has mild difficulty with memory. Denies dizziness, difficulty with speech or problems with balance and coordination.  Psych: Denies anxiety, depression, SI/HI.  No other specific  complaints in a complete review of systems (except as listed in HPI above).  Objective:  PE:  BP 126/78   Pulse 62    Temp (!) 97.4 F (36.3 C) (Temporal)   Ht 5' 8.5" (1.74 m)   Wt 194 lb (88 kg)   SpO2 97%   BMI 29.07 kg/m   Wt Readings from Last 3 Encounters:  05/08/19 195 lb (88.5 kg)  02/10/19 185 lb (83.9 kg)  12/26/18 184 lb (83.5 kg)    General: Appears his stated age, well developed, well nourished in NAD. Skin: Warm, dry and intact. No  ulcerations noted. HEENT: Head: normal shape and size; Eyes: sclera white, no icterus, conjunctiva pink, PERRLA and EOMs intact;  Neck: Neck supple, trachea midline. No masses, lumps or thyromegaly present.  Cardiovascular: Normal rate and rhythm. S1,S2 noted.  No murmur, rubs or gallops noted. No JVD or BLE edema. No carotid bruits noted. Pulmonary/Chest: Normal effort and positive vesicular breath sounds. No respiratory distress. No wheezes, rales or ronchi noted.  Abdomen: Soft and nontender. Normal bowel sounds. No distention or masses noted. Liver, spleen and kidneys non palpable. Musculoskeletal:  Strength 5/5 BUE/BLE. No difficulty with gait.  Neurological: Alert and oriented. Cranial nerves II-XII grossly intact. Coordination normal.  Psychiatric: Mood and affect normal. Behavior is normal. Judgment and thought content normal.    BMET    Component Value Date/Time   NA 139 02/03/2019 1106   NA 140 01/30/2016 1129   K 4.4 02/03/2019 1106   K 4.4 01/30/2016 1129   CL 107 02/03/2019 1106   CL 104 01/06/2012 1355   CO2 24 02/03/2019 1106   CO2 26 01/30/2016 1129   GLUCOSE 82 02/03/2019 1106   GLUCOSE 191 (H) 01/30/2016 1129   GLUCOSE 77 01/06/2012 1355   BUN 16 02/03/2019 1106   BUN 15.0 01/30/2016 1129   CREATININE 1.86 (H) 02/03/2019 1106   CREATININE 1.29 (H) 02/17/2016 1011   CREATININE 1.4 (H) 01/30/2016 1129   CALCIUM 9.5 02/03/2019 1106   CALCIUM 9.4 01/30/2016 1129   GFRNONAA 36 (L) 02/03/2019 1106   GFRAA 41 (L) 02/03/2019 1106    Lipid Panel     Component Value Date/Time   CHOL 124 04/21/2018 0942   TRIG 161.0 (H)  04/21/2018 0942   HDL 44.60 04/21/2018 0942   CHOLHDL 3 04/21/2018 0942   VLDL 32.2 04/21/2018 0942   LDLCALC 47 04/21/2018 0942    CBC    Component Value Date/Time   WBC 5.5 02/03/2019 1106   WBC 9.3 10/21/2018 0609   RBC 4.57 02/03/2019 1106   HGB 13.5 02/03/2019 1106   HGB 13.5 01/29/2017 0948   HCT 40.7 02/03/2019 1106   HCT 40.9 01/29/2017 0948   PLT 213 02/03/2019 1106   PLT 187 01/29/2017 0948   MCV 89.1 02/03/2019 1106   MCV 91.7 01/29/2017 0948   MCH 29.5 02/03/2019 1106   MCHC 33.2 02/03/2019 1106   RDW 12.0 02/03/2019 1106   RDW 12.5 01/29/2017 0948   LYMPHSABS 1.5 02/03/2019 1106   LYMPHSABS 1.1 01/29/2017 0948   MONOABS 0.4 02/03/2019 1106   MONOABS 0.2 01/29/2017 0948   EOSABS 0.1 02/03/2019 1106   EOSABS 0.1 01/29/2017 0948   BASOSABS 0.0 02/03/2019 1106   BASOSABS 0.0 01/29/2017 0948    Hgb A1C Lab Results  Component Value Date   HGBA1C 6.1 04/21/2018      Assessment and Plan:   Medicare Annual Wellness Visit:  Diet: He does eat meat.  He consumes fruits and veggies daily. He tries to avoid fried foods. He drinks mostly water, juice. Physical activity: Walking Depression/mood screen: Negative, PHQ 9 score of 0 Hearing: Intact to whispered voice Visual acuity: Grossly normal, performs annual eye exam  ADLs: Capable Fall risk: None Home safety: Good Cognitive evaluation: Trouble with recall. Intact to orientation, naming, and repetition EOL planning: No adv directives, full code/ I agree  Preventative Medicine: Flu, tetanus, pneumovax, prevnar and shingles vaccine UTD. He has had his first Covid vaccine. Referral to GI for screening colonoscopy. Encouraged him to consume a balanced diet and exercise regimen. Advised him to see an eye doctor and dentist annually. Will check CBC, CMET, Lipid, A1C and PSA today. Due dates for screening exam given to pt as part of his AVS.   Next appointment: 6 months, follow up chronic conditions  Webb Silversmith,  NP  This visit occurred during the SARS-CoV-2 public health emergency.  Safety protocols were in place, including screening questions prior to the visit, additional usage of staff PPE, and extensive cleaning of exam room while observing appropriate contact time as indicated for disinfecting solutions.

## 2019-05-30 NOTE — Assessment & Plan Note (Signed)
No angina. Continue Simvastatin, Metoprolol and Xarelto

## 2019-05-30 NOTE — Assessment & Plan Note (Signed)
Continue Losartan, Metoprolol and Nifedipine CMET today

## 2019-05-30 NOTE — Assessment & Plan Note (Signed)
Mainly cognitive residual effects Continue Losartan, Metoprolol, Nifedipine, Xarelto and Simvastatin He will continue to follow with neurology

## 2019-05-30 NOTE — Assessment & Plan Note (Signed)
A1C today No urine microalbumin secondary to ACEI therapy Continue Glipizide Encouraged him to check his feet regularly Encouraged routine eye exams Foot exam today

## 2019-05-30 NOTE — Assessment & Plan Note (Signed)
Continue Flomax He will continue to follow with urology

## 2019-05-30 NOTE — Assessment & Plan Note (Signed)
Continue Nifedipine, Metoprolol and Xarelto CBC today Continue to follow with cardiology.

## 2019-05-30 NOTE — Assessment & Plan Note (Signed)
Continue to follow with oncology.

## 2019-05-30 NOTE — Assessment & Plan Note (Signed)
Continue Keppra Continue to follow with neurology

## 2019-05-30 NOTE — Assessment & Plan Note (Signed)
-   Continue Symbicort  

## 2019-05-30 NOTE — Assessment & Plan Note (Signed)
Advised him not to take ASA with Xarelto He can take Tylenol Arthritis as needed

## 2019-05-30 NOTE — Patient Instructions (Signed)
Health Maintenance After Age 72 After age 72, you are at a higher risk for certain long-term diseases and infections as well as injuries from falls. Falls are a major cause of broken bones and head injuries in people who are older than age 72. Getting regular preventive care can help to keep you healthy and well. Preventive care includes getting regular testing and making lifestyle changes as recommended by your health care provider. Talk with your health care provider about:  Which screenings and tests you should have. A screening is a test that checks for a disease when you have no symptoms.  A diet and exercise plan that is right for you. What should I know about screenings and tests to prevent falls? Screening and testing are the best ways to find a health problem early. Early diagnosis and treatment give you the best chance of managing medical conditions that are common after age 72. Certain conditions and lifestyle choices may make you more likely to have a fall. Your health care provider may recommend:  Regular vision checks. Poor vision and conditions such as cataracts can make you more likely to have a fall. If you wear glasses, make sure to get your prescription updated if your vision changes.  Medicine review. Work with your health care provider to regularly review all of the medicines you are taking, including over-the-counter medicines. Ask your health care provider about any side effects that may make you more likely to have a fall. Tell your health care provider if any medicines that you take make you feel dizzy or sleepy.  Osteoporosis screening. Osteoporosis is a condition that causes the bones to get weaker. This can make the bones weak and cause them to break more easily.  Blood pressure screening. Blood pressure changes and medicines to control blood pressure can make you feel dizzy.  Strength and balance checks. Your health care provider may recommend certain tests to check your  strength and balance while standing, walking, or changing positions.  Foot health exam. Foot pain and numbness, as well as not wearing proper footwear, can make you more likely to have a fall.  Depression screening. You may be more likely to have a fall if you have a fear of falling, feel emotionally low, or feel unable to do activities that you used to do.  Alcohol use screening. Using too much alcohol can affect your balance and may make you more likely to have a fall. What actions can I take to lower my risk of falls? General instructions  Talk with your health care provider about your risks for falling. Tell your health care provider if: ? You fall. Be sure to tell your health care provider about all falls, even ones that seem minor. ? You feel dizzy, sleepy, or off-balance.  Take over-the-counter and prescription medicines only as told by your health care provider. These include any supplements.  Eat a healthy diet and maintain a healthy weight. A healthy diet includes low-fat dairy products, low-fat (lean) meats, and fiber from whole grains, beans, and lots of fruits and vegetables. Home safety  Remove any tripping hazards, such as rugs, cords, and clutter.  Install safety equipment such as grab bars in bathrooms and safety rails on stairs.  Keep rooms and walkways well-lit. Activity   Follow a regular exercise program to stay fit. This will help you maintain your balance. Ask your health care provider what types of exercise are appropriate for you.  If you need a cane or   walker, use it as recommended by your health care provider.  Wear supportive shoes that have nonskid soles. Lifestyle  Do not drink alcohol if your health care provider tells you not to drink.  If you drink alcohol, limit how much you have: ? 0-1 drink a day for women. ? 0-2 drinks a day for men.  Be aware of how much alcohol is in your drink. In the [place] one drink equals one typical bottle of beer (12  oz), one-half glass of wine (5 oz), or one shot of hard liquor (1 oz).  Do not use any products that contain nicotine or tobacco, such as cigarettes and e-cigarettes. If you need help quitting, ask your health care provider. Summary  Having a healthy lifestyle and getting preventive care can help to protect your health and wellness after age 72.  Screening and testing are the best way to find a health problem early and help you avoid having a fall. Early diagnosis and treatment give you the best chance for managing medical conditions that are more common for people who are older than age 72.  Falls are a major cause of broken bones and head injuries in people who are older than age 72. Take precautions to prevent a fall at home.  Work with your health care provider to learn what changes you can make to improve your health and wellness and to prevent falls. This information is not intended to replace advice given to you by your health care provider. Make sure you discuss any questions you have with your health care provider. Document Revised: 06/30/2018 Document Reviewed: 01/20/2017 Elsevier Patient Education  2020 [employer].  

## 2019-05-30 NOTE — Assessment & Plan Note (Signed)
CMET and Lipid profile today Encouraged him to consume a low fat diet Continue Simvastatin and Fish Oil

## 2019-05-31 ENCOUNTER — Ambulatory Visit (INDEPENDENT_AMBULATORY_CARE_PROVIDER_SITE_OTHER): Payer: Medicare Other | Admitting: *Deleted

## 2019-05-31 DIAGNOSIS — Z95810 Presence of automatic (implantable) cardiac defibrillator: Secondary | ICD-10-CM

## 2019-05-31 LAB — CUP PACEART REMOTE DEVICE CHECK
Battery Remaining Longevity: 71 mo
Battery Remaining Percentage: 69 %
Battery Voltage: 2.98 V
Brady Statistic RV Percent Paced: 1 %
Date Time Interrogation Session: 20210310080113
HighPow Impedance: 50 Ohm
HighPow Impedance: 51 Ohm
Implantable Lead Implant Date: 20080527
Implantable Lead Location: 753860
Implantable Lead Model: 7121
Implantable Pulse Generator Implant Date: 20171201
Lead Channel Impedance Value: 490 Ohm
Lead Channel Pacing Threshold Amplitude: 1.25 V
Lead Channel Pacing Threshold Pulse Width: 0.5 ms
Lead Channel Sensing Intrinsic Amplitude: 12 mV
Lead Channel Setting Pacing Amplitude: 2.5 V
Lead Channel Setting Pacing Pulse Width: 0.5 ms
Lead Channel Setting Sensing Sensitivity: 0.5 mV
Pulse Gen Serial Number: 7283394

## 2019-06-01 NOTE — Progress Notes (Signed)
ICD Remote  

## 2019-06-12 ENCOUNTER — Encounter: Payer: Self-pay | Admitting: Internal Medicine

## 2019-06-12 ENCOUNTER — Other Ambulatory Visit: Payer: Self-pay

## 2019-06-12 ENCOUNTER — Ambulatory Visit: Payer: Medicare Other | Admitting: Internal Medicine

## 2019-06-12 ENCOUNTER — Encounter (INDEPENDENT_AMBULATORY_CARE_PROVIDER_SITE_OTHER): Payer: Self-pay

## 2019-06-12 VITALS — BP 108/60 | HR 72 | Resp 15 | Ht 69.0 in | Wt 195.0 lb

## 2019-06-12 DIAGNOSIS — Z95810 Presence of automatic (implantable) cardiac defibrillator: Secondary | ICD-10-CM | POA: Diagnosis not present

## 2019-06-12 DIAGNOSIS — I498 Other specified cardiac arrhythmias: Secondary | ICD-10-CM

## 2019-06-12 DIAGNOSIS — I10 Essential (primary) hypertension: Secondary | ICD-10-CM

## 2019-06-12 LAB — CUP PACEART INCLINIC DEVICE CHECK
Date Time Interrogation Session: 20210322142533
Implantable Lead Implant Date: 20080527
Implantable Lead Location: 753860
Implantable Lead Model: 7121
Implantable Pulse Generator Implant Date: 20171201
Pulse Gen Serial Number: 7283394

## 2019-06-12 NOTE — Patient Instructions (Signed)
Medication Instructions:  Your physician recommends that you continue on your current medications as directed. Please refer to the Current Medication list given to you today.  Labwork: None ordered.  Testing/Procedures: None ordered.  Follow-Up: Your physician wants you to follow-up in: one year with Dr. Lovena Le.   You will receive a reminder letter in the mail two months in advance. If you don't receive a letter, please call our office to schedule the follow-up appointment.  Remote monitoring is used to monitor your ICD from home. This monitoring reduces the number of office visits required to check your device to one time per year. It allows [place] to keep an eye on the functioning of your device to ensure it is working properly. You are scheduled for a device check from home on 08/30/2019. You may send your transmission at any time that day. If you have a wireless device, the transmission will be sent automatically. After your physician reviews your transmission, you will receive a postcard with your next transmission date.  Any Other Special Instructions Will Be Listed Below (If Applicable).  If you need a refill on your cardiac medications before your next appointment, please call your pharmacy.

## 2019-06-12 NOTE — Progress Notes (Signed)
HPI Mr. Alpern returns today for ongoing followup and preoperative evaluation. He is a pleasant 74 yo man with Brugada syndrome,s/p ICD insertion. He denies chest pain or sob. He remains active. No other complaints. No ICD therapies. Allergies  Allergen Reactions  . Lipitor [Atorvastatin] Rash     Current Outpatient Medications  Medication Sig Dispense Refill  . acetaminophen (TYLENOL) 325 MG tablet Take 650 mg by mouth 4 (four) times daily as needed.    . baclofen (LIORESAL) 10 MG tablet Take 1 tablet (10 mg total) by mouth 2 (two) times daily as needed for muscle spasms. 20 each 0  . bisacodyl (DULCOLAX) 10 MG suppository Place 1 suppository (10 mg total) rectally daily as needed for moderate constipation. 12 suppository 0  . budesonide-formoterol (SYMBICORT) 160-4.5 MCG/ACT inhaler Inhale 2 puffs into the lungs 2 (two) times daily. 10.2 g 1  . ciclopirox (LOPROX) 0.77 % cream Apply 1 application topically 2 (two) times daily as needed (for rash).     . Clotrimazole 1 % OINT Apply 1 application topically daily. (Patient taking differently: Apply 1 application topically daily as needed (for rash). ) 30 g 0  . glipiZIDE (GLUCOTROL) 5 MG tablet Take 1 tablet (5 mg total) by mouth 2 (two) times daily before a meal. MUST SCHEDULE ANNUAL EXAM 180 tablet 0  . HYDROcodone-acetaminophen (NORCO) 5-325 MG tablet Take 1-2 tablets by mouth every 6 (six) hours as needed for severe pain (Take tylenol for mild and moderate pain). (Patient taking differently: Take 1 tablet by mouth 4 (four) times daily as needed for severe pain (Take tylenol for mild and moderate pain). ) 30 tablet 0  . lactulose (CHRONULAC) 10 GM/15ML solution Take 20 g by mouth 2 (two) times daily as needed for mild constipation.    . levETIRAcetam (KEPPRA) 500 MG tablet Take 500-1,000 mg by mouth See admin instructions. Take one tablet by mouth in the morning and two tablets by mouth at night    . losartan (COZAAR) 100 MG tablet  Take 50 mg by mouth daily.    . metoprolol tartrate (LOPRESSOR) 25 MG tablet Take 12.5 mg by mouth 2 (two) times daily.    . Multiple Vitamin (MULTIVITAMIN) tablet Take 1 tablet by mouth daily.      . naftifine (NAFTIN) 1 % cream Apply 1 application topically 2 (two) times daily as needed (For  infection between toes).     . Niacin, Antihyperlipidemic, 500 MG TABS Take 500 mg by mouth daily. 30 tablet 2  . NIFEdipine (PROCARDIA-XL/ADALAT CC) 30 MG 24 hr tablet Take 30 mg by mouth daily. "PRN"    . Omega-3 Fatty Acids (FISH OIL) 1200 MG CAPS Take 1,200 mg by mouth daily.     . ondansetron (ZOFRAN) 4 MG tablet Take 1 tablet (4 mg total) by mouth every 6 (six) hours as needed for nausea. 20 tablet 0  . polyethylene glycol (MIRALAX / GLYCOLAX) 17 g packet Take 17 g by mouth 2 (two) times daily. 14 each 0  . Rivaroxaban (XARELTO) 20 MG TABS tablet Take 20 mg by mouth daily.     Marland Kitchen senna (SENOKOT) 8.6 MG TABS tablet Take 1 tablet (8.6 mg total) by mouth 2 (two) times daily. 120 tablet 0  . simvastatin (ZOCOR) 20 MG tablet Take 10 mg by mouth at bedtime.     . tamsulosin (FLOMAX) 0.4 MG CAPS capsule Take 0.4 mg by mouth daily.  5   No current facility-administered medications for  this visit.     Past Medical History:  Diagnosis Date  . Acute myocardial infarction ([place]) 06/1983  . Arthritis   . Atrial fibrillation ([place])   . Automatic implantable cardiac defibrillator in situ   . Borderline diabetes mellitus   . BPH without obstruction/lower urinary tract symptoms   . CAD (coronary artery disease)   . Chronic kidney disease, stage 1   . Colonic polyp 2004   hyperplastic   . COPD (chronic obstructive pulmonary disease) ([place])   . Diabetes mellitus without complication ([place])   . Fall   . GERD (gastroesophageal reflux disease)   . History of renal insufficiency syndrome   . HTN (hypertension)   . Hyperlipidemia   . Knee derangement 09/2018   LEFT KNEE  . Monoclonal gammopathy   . 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 Seizure disorder ([place])   . Stroke [facility])    pt reports he has had 3    ROS:   All systems reviewed and negative except as noted in the HPI.   Past Surgical History:  Procedure Laterality Date  . CARDIAC CATHETERIZATION  1987   Showed distal left circumflex 100% occluded   . CARDIAC DEFIBRILLATOR PLACEMENT  08/17/2006   Implantation of a St. Jude single chamber defibrillator  . EP IMPLANTABLE DEVICE N/A 02/21/2016   Procedure: ICD Generator Changeout;  Surgeon: Evans Lance, MD;  Location: [place] [facility];  Service: Cardiovascular;  Laterality: N/A;  . INGUINAL HERNIA REPAIR Left   . QUADRICEPS TENDON REPAIR Left 10/20/2018   Procedure: REPAIR QUADRICEP TENDON;  Surgeon: Renette Butters, MD;  Location: [facility];  Service: Orthopedics;  Laterality: Left;     Family History  Problem Relation Age of Onset  . Colon cancer Brother   . Heart attack Mother   . Heart attack Brother   . Seizures Neg Hx      Social History   Socioeconomic History  . Marital status: Single    Spouse name: Not on file  . Number of children: 1  . Years of education: Not on file  . Highest education level: Not on file  Occupational History  . Occupation: retired    Fish farm manager: RETIRED  Tobacco Use  . Smoking status: Former Smoker    Packs/day: 0.50    Years: 15.00    Pack years: 7.50    Types: Cigarettes    Quit date: 03/23/2004    Years since quitting: 15.2  . Smokeless tobacco: Never Used  Substance and Sexual Activity  . Alcohol use: Yes    Alcohol/week: 1.0 standard drinks    Types: 1 Glasses of wine per week    Comment: rare--wine  . Drug use: No  . Sexual activity: Not on file  Other Topics Concern  . Not on file  Social History Narrative   ICD-St. Jude  Remote-Yes      Live currently @ [facility] for rehabilitation. Plans to discharge to his daughter's house for continuation of rehab.   Social Determinants of Health    Financial Resource Strain:   . Difficulty of Paying Living Expenses:   Food Insecurity:   . Worried About Charity fundraiser in the Last Year:   . Arboriculturist in the Last Year:   Transportation Needs:   . Film/video editor (Medical):   Marland Kitchen Lack of Transportation (Non-Medical):   Physical Activity:   . Days of Exercise per Week:   . Minutes  of Exercise per Session:   Stress:   . Feeling of Stress :   Social Connections:   . Frequency of Communication with Friends and Family:   . Frequency of Social Gatherings with Friends and Family:   . Attends Religious Services:   . Active Member of Clubs or Organizations:   . Attends Archivist Meetings:   Marland Kitchen Marital Status:   Intimate Partner Violence:   . Fear of Current or Ex-Partner:   . Emotionally Abused:   Marland Kitchen Physically Abused:   . Sexually Abused:      BP 108/60   Pulse 72   Resp 15   Ht 5\' 9"  (1.753 m)   Wt 195 lb (88.5 kg)   SpO2 97%   BMI 28.80 kg/m   Physical Exam:  Well appearing NAD HEENT: Unremarkable Neck:  No JVD, no thyromegally Lymphatics:  No adenopathy Back:  No CVA tenderness Lungs:  Clear with no wheezes HEART:  Regular rate rhythm, no murmurs, no rubs, no clicks Abd:  soft, positive bowel sounds, no organomegally, no rebound, no guarding Ext:  2 plus pulses, no edema, no cyanosis, no clubbing Skin:  No rashes no nodules Neuro:  CN II through XII intact, motor grossly intact   DEVICE  Normal device function.  See PaceArt for details.   Assess/Plan: 1. Brugada syndrome - He has had no recurrent ventricular arrhythmias. He will continue his current meds. No indication for quinidine. 2. ICD - His St. Jude single chamber ICD is working normally. 3. CAD - he denies anginal symptoms. He remains active.  Brad Brown.D.

## 2019-06-13 DIAGNOSIS — I129 Hypertensive chronic kidney disease with stage 1 through stage 4 chronic kidney disease, or unspecified chronic kidney disease: Secondary | ICD-10-CM | POA: Diagnosis not present

## 2019-06-13 DIAGNOSIS — N1831 Chronic kidney disease, stage 3a: Secondary | ICD-10-CM | POA: Diagnosis not present

## 2019-06-21 ENCOUNTER — Encounter: Payer: Self-pay | Admitting: Physician Assistant

## 2019-07-06 ENCOUNTER — Telehealth: Payer: Self-pay

## 2019-07-06 ENCOUNTER — Ambulatory Visit: Payer: Medicare Other | Admitting: Physician Assistant

## 2019-07-06 ENCOUNTER — Encounter: Payer: Self-pay | Admitting: Physician Assistant

## 2019-07-06 VITALS — BP 144/78 | HR 84 | Temp 97.8°F | Ht 69.0 in | Wt 194.0 lb

## 2019-07-06 DIAGNOSIS — Z800 Family history of malignant neoplasm of digestive organs: Secondary | ICD-10-CM

## 2019-07-06 DIAGNOSIS — Z86010 Personal history of colonic polyps: Secondary | ICD-10-CM

## 2019-07-06 MED ORDER — NA SULFATE-K SULFATE-MG SULF 17.5-3.13-1.6 GM/177ML PO SOLN: 1.0000 | Freq: Once | ORAL | 0 refills | Status: AC

## 2019-07-06 NOTE — Telephone Encounter (Signed)
Patient with diagnosis of afib on Xarelto for anticoagulation.   Procedure: Colonoscopy Date of procedure: 07/26/19  CHADS2-VASc score of  6 (HTN, AGE, DM2, stroke/tia x 2, CAD)  CrCl 58 ml/min  Per office protocol, patient can hold Xarelto for 1 day prior to procedure.

## 2019-07-06 NOTE — Patient Instructions (Signed)
If you are age 72 or older, your body mass index should be between 23-30. Your Body mass index is 28.65 kg/m. If this is out of the aforementioned range listed, please consider follow up with your Primary Care Provider.  If you are age 49 or younger, your body mass index should be between 19-25. Your Body mass index is 28.65 kg/m. If this is out of the aformentioned range listed, please consider follow up with your Primary Care Provider.   You have been scheduled for a colonoscopy. Please follow written instructions given to you at your visit today.  Please pick up your prep supplies at the pharmacy within the next 1-3 days. If you use inhalers (even only as needed), please bring them with you on the day of your procedure.  You will be contaced by our office prior to your procedure for directions on holding your Xarelto.  If you do not hear from our office 1 week prior to your scheduled procedure, please call [phone] to discuss.

## 2019-07-06 NOTE — Telephone Encounter (Signed)
Pharmacy, can you please comment on how long patient should hold Xarelto for prior to colonoscopy?  Thank you!

## 2019-07-06 NOTE — Progress Notes (Signed)
Subjective:    Patient ID: Brad Brown, male    DOB: 1946-02-26, 74 y.o.   MRN: [id number]  HPI Brad Brown is a pleasant 74 year old male, known to Dr. Fuller Plan, who comes in today to discuss follow-up colonoscopy. He has family history of colon cancer and personal history of multiple adenomatous colon polyps.  Last colonoscopy was done in 2015 with finding of 7 sessile polyps, largest 7 mm and small internal hemorrhoids.  Path returned showing 6 of the polyps to be tubular adenomas and one hyperplastic. Patient is on chronic Xarelto with history of atrial fibrillation.  Also with history of coronary artery disease status post prior MI, history of Brugada syndrome, status post pacemaker/defibrillator placement, prior history of CVAs x2, hypertension, COPD without oxygen use, chronic GERD, adult onset diabetes mellitus, seizure disorder and MGUS.  Patient says he has very occasional issues with constipation, generally relieved by Ex-Lax.  No ongoing abdominal discomfort, no melena or hematochezia.  Says he has been feeling well recently.  He has completed COVID-19 vaccination. He has not had any recent cardiac issues, and last CVA was a few years ago.  Review of Systems Pertinent positive and negative review of systems were noted in the above HPI section.  All other review of systems was otherwise negative.  Outpatient Encounter Medications as of 07/06/2019  Medication Sig  . acetaminophen (TYLENOL) 325 MG tablet Take 650 mg by mouth 4 (four) times daily as needed.  . baclofen (LIORESAL) 10 MG tablet Take 1 tablet (10 mg total) by mouth 2 (two) times daily as needed for muscle spasms.  . bisacodyl (DULCOLAX) 10 MG suppository Place 1 suppository (10 mg total) rectally daily as needed for moderate constipation.  . budesonide-formoterol (SYMBICORT) 160-4.5 MCG/ACT inhaler Inhale 2 puffs into the lungs 2 (two) times daily.  . ciclopirox (LOPROX) 0.77 % cream Apply 1 application topically 2 (two)  times daily as needed (for rash).   . Clotrimazole 1 % OINT Apply 1 application topically daily. (Patient taking differently: Apply 1 application topically daily as needed (for rash). )  . glipiZIDE (GLUCOTROL) 5 MG tablet Take 1 tablet (5 mg total) by mouth 2 (two) times daily before a meal. MUST SCHEDULE ANNUAL EXAM  . HYDROcodone-acetaminophen (NORCO) 5-325 MG tablet Take 1-2 tablets by mouth every 6 (six) hours as needed for severe pain (Take tylenol for mild and moderate pain). (Patient taking differently: Take 1 tablet by mouth 4 (four) times daily as needed for severe pain (Take tylenol for mild and moderate pain). )  . lactulose (CHRONULAC) 10 GM/15ML solution Take 20 g by mouth 2 (two) times daily as needed for mild constipation.  . levETIRAcetam (KEPPRA) 500 MG tablet Take 500-1,000 mg by mouth See admin instructions. Take one tablet by mouth in the morning and two tablets by mouth at night  . losartan (COZAAR) 100 MG tablet Take 50 mg by mouth daily.  . metoprolol tartrate (LOPRESSOR) 25 MG tablet Take 12.5 mg by mouth 2 (two) times daily.  . Multiple Vitamin (MULTIVITAMIN) tablet Take 1 tablet by mouth daily.    . naftifine (NAFTIN) 1 % cream Apply 1 application topically 2 (two) times daily as needed (For  infection between toes).   . Niacin, Antihyperlipidemic, 500 MG TABS Take 500 mg by mouth daily.  Marland Kitchen NIFEdipine (PROCARDIA-XL/ADALAT CC) 30 MG 24 hr tablet Take 30 mg by mouth daily. "PRN"  . Omega-3 Fatty Acids (FISH OIL) 1200 MG CAPS Take 1,200 mg by mouth daily.   Marland Kitchen  ondansetron (ZOFRAN) 4 MG tablet Take 1 tablet (4 mg total) by mouth every 6 (six) hours as needed for nausea.  . polyethylene glycol (MIRALAX / GLYCOLAX) 17 g packet Take 17 g by mouth 2 (two) times daily.  . Rivaroxaban (XARELTO) 20 MG TABS tablet Take 20 mg by mouth daily.   Marland Kitchen senna (SENOKOT) 8.6 MG TABS tablet Take 1 tablet (8.6 mg total) by mouth 2 (two) times daily.  . simvastatin (ZOCOR) 20 MG tablet Take 10 mg by  mouth at bedtime.   . tamsulosin (FLOMAX) 0.4 MG CAPS capsule Take 0.4 mg by mouth daily.  . Na Sulfate-K Sulfate-Mg Sulf 17.5-3.13-1.6 GM/177ML SOLN Take 1 kit by mouth once for 1 dose.   No facility-administered encounter medications on file as of 07/06/2019.   Allergies  Allergen Reactions  . Lipitor [Atorvastatin] Rash   Patient Active Problem List   Diagnosis Date Noted  . BPH (benign prostatic hyperplasia) 04/09/2016  . GERD (gastroesophageal reflux disease) 08/27/2013  . Arthritis 08/27/2013  . Atrial fibrillation ([place]) 06/09/2012  . Brugada syndrome 08/05/2011  . MGUS (monoclonal gammopathy of unknown significance) 08/05/2011  . Hypertension 08/05/2010  . MI 07/24/2008  . Automatic implantable cardioverter-defibrillator in situ 07/24/2008  . Type 2 diabetes mellitus with stage 1 chronic kidney disease, without long-term current use of insulin ([place]) 02/12/2008  . Coronary atherosclerosis 03/29/2007  . HLD (hyperlipidemia) 02/08/2007  . Cerebral artery occlusion with cerebral infarction ([place]) 02/08/2007  . COPD (chronic obstructive pulmonary disease) ([place]) 02/08/2007  . Seizures ([place]) 02/08/2007   Social History   Socioeconomic History  . Marital status: Single    Spouse name: Not on file  . Number of children: 1  . Years of education: Not on file  . Highest education level: Not on file  Occupational History  . Occupation: retired    Fish farm manager: RETIRED  Tobacco Use  . Smoking status: Former Smoker    Packs/day: 0.50    Years: 15.00    Pack years: 7.50    Types: Cigarettes    Quit date: 03/23/2004    Years since quitting: 15.2  . Smokeless tobacco: Never Used  Substance and Sexual Activity  . Alcohol use: Yes    Alcohol/week: 1.0 standard drinks    Types: 1 Glasses of wine per week    Comment: rare--wine  . Drug use: No  . Sexual activity: Not on file  Other Topics Concern  . Not on file  Social History Narrative   ICD-St. Jude  Remote-Yes      Live  currently @ [facility] for rehabilitation. Plans to discharge to his daughter's house for continuation of rehab.   Social Determinants of Health   Financial Resource Strain:   . Difficulty of Paying Living Expenses:   Food Insecurity:   . Worried About Charity fundraiser in the Last Year:   . Arboriculturist in the Last Year:   Transportation Needs:   . Film/video editor (Medical):   Marland Kitchen Lack of Transportation (Non-Medical):   Physical Activity:   . Days of Exercise per Week:   . Minutes of Exercise per Session:   Stress:   . Feeling of Stress :   Social Connections:   . Frequency of Communication with Friends and Family:   . Frequency of Social Gatherings with Friends and Family:   . Attends Religious Services:   . Active Member of Clubs or Organizations:   . Attends Archivist Meetings:   .  Marital Status:   Intimate Partner Violence:   . Fear of Current or Ex-Partner:   . Emotionally Abused:   Marland Kitchen Physically Abused:   . Sexually Abused:     Brad Brown family history includes Colon cancer in his brother; Heart attack in his brother and mother.      Objective:    Vitals:   07/06/19 1518  BP: (!) 144/78  Pulse: 84  Temp: 97.8 F (36.6 C)    Physical Exam Well-developed well-nourished older/male in no acute distress.  Pleasant height, Weight, 194 BMI 28.6  HEENT; nontraumatic normocephalic, EOMI, PE RR LA, sclera anicteric. Oropharynx; not examined today Neck; supple, no JVD Cardiovascular; regular rate and rhythm with S1-S2, no murmur rub or gallop pacemaker/defibrillator left chest wall Pulmonary; Clear bilaterally Abdomen; soft, nontender, nondistended, no palpable mass or hepatosplenomegaly, bowel sounds are active Rectal; not done today Skin; benign exam, no jaundice rash or appreciable lesions Extremities; no clubbing cyanosis or edema skin warm and dry Neuro/Psych; alert and oriented x4, grossly nonfocal mood and affect  appropriate       Assessment & Plan:   #94 74 year old white male, with personal history of multiple adenomatous colon polyps, and family history of colon cancer, overdue for follow-up colonoscopy.  Last colonoscopy 2015 and recommended for 5-year interval follow-up.  Patient is asymptomatic currently  #2 chronic anticoagulation-on Xarelto #3 history of atrial fibrillation #4 history of Brugada syndrome status post pacemaker and ICD placement #5 coronary artery disease #6 history of prior CVA x2 #7  COPD-no oxygen use #8  Seizure disorder #9.  Adult onset diabetes mellitus #10  MGUS  Plan; Patient will be scheduled for colonoscopy with Dr. Fuller Plan.  Procedure was discussed in detail with the patient including indications risks and benefits and he is agreeable to proceed. Patient will need to hold Xarelto for 24 hours prior to procedure.  We will communicate with his cardiologist Dr. Lovena Le to assure this is reasonable for this patient. Patient has received COVID-19 vaccination.    Brad Brown Genia Harold PA-C 07/06/2019   Cc: Jearld Fenton, NP

## 2019-07-06 NOTE — Telephone Encounter (Signed)
[facility] Pre-operative Risk Assessment     Request for surgical clearance:     Endoscopy Procedure  What type of surgery is being performed?     Colonoscopy  When is this surgery scheduled?     07/26/19  What type of clearance is required ?   Pharmacy  Are there any medications that need to be held prior to surgery and how long? 24 hours prior to procedure  Practice name and name of physician performing surgery?      [name]  What is your office phone and fax number?      Phone- [phone]  [phone]  Anesthesia type (None, local, MAC, general) ?       MAC

## 2019-07-06 NOTE — Telephone Encounter (Signed)
   Primary Cardiologist: Cristopher Peru, MD  Chart reviewed as part of pre-operative protocol coverage. Patient is scheduled for a colonoscopy on 07/26/2019 and we were asked to give our recommendation on holding Xarelto.   Per Pharmacy and office protocol, "patient can hold Xarelto for 1 day prior to procedure."    I will route this recommendation to the requesting party via Epic fax function and remove from pre-op pool.  Please call with questions.  Darreld Mclean, PA-C 07/06/2019, 4:56 PM

## 2019-07-07 NOTE — Progress Notes (Signed)
Reviewed and agree with management plan.  Urie Loughner T. Rehema Muffley, MD FACG [facility]  

## 2019-07-13 NOTE — Telephone Encounter (Signed)
Left voicemail for patient call the office to discuss stopping his Xarelto. Cardiology has approved him to be able to stop for 1 day prior.

## 2019-07-20 NOTE — Telephone Encounter (Signed)
Left message to return call to to discuss stopping Xarelto 1 day prior to Colonoscopy.

## 2019-07-21 ENCOUNTER — Encounter: Payer: Self-pay | Admitting: Gastroenterology

## 2019-07-21 NOTE — Telephone Encounter (Signed)
Patient has returned call and expressed understanding to stop his Xarelto the day before the procedure and to restart afterwards.

## 2019-07-26 ENCOUNTER — Other Ambulatory Visit: Payer: Self-pay

## 2019-07-26 ENCOUNTER — Ambulatory Visit (AMBULATORY_SURGERY_CENTER): Payer: Medicare Other | Admitting: Gastroenterology

## 2019-07-26 ENCOUNTER — Encounter: Payer: Self-pay | Admitting: Gastroenterology

## 2019-07-26 VITALS — BP 112/71 | HR 60 | Temp 97.1°F | Resp 13 | Ht 69.0 in | Wt 194.0 lb

## 2019-07-26 DIAGNOSIS — D124 Benign neoplasm of descending colon: Secondary | ICD-10-CM | POA: Diagnosis not present

## 2019-07-26 DIAGNOSIS — D123 Benign neoplasm of transverse colon: Secondary | ICD-10-CM

## 2019-07-26 DIAGNOSIS — I2510 Atherosclerotic heart disease of native coronary artery without angina pectoris: Secondary | ICD-10-CM | POA: Diagnosis not present

## 2019-07-26 DIAGNOSIS — Z800 Family history of malignant neoplasm of digestive organs: Secondary | ICD-10-CM

## 2019-07-26 DIAGNOSIS — D125 Benign neoplasm of sigmoid colon: Secondary | ICD-10-CM | POA: Diagnosis not present

## 2019-07-26 DIAGNOSIS — Z86010 Personal history of colonic polyps: Secondary | ICD-10-CM

## 2019-07-26 HISTORY — PX: COLONOSCOPY: SHX174

## 2019-07-26 MED ORDER — SODIUM CHLORIDE 0.9 % IV SOLN: 500.0000 mL | INTRAVENOUS | Status: DC

## 2019-07-26 NOTE — Progress Notes (Signed)
Report to PACU, RN, vss, BBS= Clear.  

## 2019-07-26 NOTE — Progress Notes (Signed)
Called to room to assist during endoscopic procedure.  Patient ID and intended procedure confirmed with present staff. Received instructions for my participation in the procedure from the performing physician.  

## 2019-07-26 NOTE — Op Note (Addendum)
[facility] Patient Name: Brad Brown Procedure Date: 07/26/2019 2:01 PM MRN: [id number] Endoscopist: Ladene Artist , MD Age: 74 Referring MD:  Date of Birth: 04-15-1945 Gender: Male Account #: [id number] Procedure:                Colonoscopy Indications:              Surveillance: Personal history of adenomatous                            polyps on last colonoscopy > 5 years ago. Family                            history of colon cancer, first degree relative. Medicines:                Monitored Anesthesia Care Procedure:                Pre-Anesthesia Assessment:                           - Prior to the procedure, a History and Physical                            was performed, and patient medications and                            allergies were reviewed. The patient's tolerance of                            previous anesthesia was also reviewed. The risks                            and benefits of the procedure and the sedation                            options and risks were discussed with the patient.                            All questions were answered, and informed consent                            was obtained. Prior Anticoagulants: The patient has                            taken Xarelto (rivaroxaban), last dose was 2 days                            prior to procedure. ASA Grade Assessment: II - A                            patient with mild systemic disease. After reviewing                            the risks and benefits, the patient was deemed in  satisfactory condition to undergo the procedure.                           After obtaining informed consent, the colonoscope                            was passed under direct vision. Throughout the                            procedure, the patient's blood pressure, pulse, and                            oxygen saturations were monitored continuously. The   Colonoscope was introduced through the anus and                            advanced to the the cecum, identified by                            appendiceal orifice and ileocecal valve. The                            ileocecal valve, appendiceal orifice, and rectum                            were photographed. The quality of the bowel                            preparation was good. The colonoscopy was performed                            without difficulty. The patient tolerated the                            procedure well. Scope In: 2:14:18 PM Scope Out: 2:33:48 PM Scope Withdrawal Time: 0 hours 16 minutes 56 seconds  Total Procedure Duration: 0 hours 19 minutes 30 seconds  Findings:                 The perianal and digital rectal examinations were                            normal.                           Four sessile polyps were found in the sigmoid colon                            (1) and descending colon (3). The polyps were 5 to                            7 mm in size. These polyps were removed with a cold                            snare. Resection and retrieval were  complete.                           Two sessile polyps were found in the transverse                            colon. The polyps were 4 mm in size. These polyps                            were removed with a cold biopsy forceps. Resection                            and retrieval were complete.                           A few medium-mouthed diverticula were found in the                            right colon. There was no evidence of diverticular                            bleeding.                           Internal hemorrhoids were found during                            retroflexion. The hemorrhoids were small and Grade                            I (internal hemorrhoids that do not prolapse).                           The exam was otherwise without abnormality on                            direct and retroflexion  views. Complications:            No immediate complications. Estimated blood loss:                            None. Estimated Blood Loss:     Estimated blood loss: none. Impression:               - Four 5 to 7 mm polyps in the sigmoid colon and in                            the descending colon, removed with a cold snare.                            Resected and retrieved.                           - Two 4 mm polyps in the transverse colon, removed  with a cold biopsy forceps. Resected and retrieved.                           - Mild diverticulosis in the right colon.                           - Internal hemorrhoids.                           - The examination was otherwise normal on direct                            and retroflexion views. Recommendation:           - Repeat colonoscopy, likely 3 years, after studies                            are complete for surveillance based on pathology                            results.                           - Patient has a contact number available for                            emergencies. The signs and symptoms of potential                            delayed complications were discussed with the                            patient. Return to normal activities tomorrow.                            Written discharge instructions were provided to the                            patient.                           - Resume previous diet.                           - Continue present medications.                           - Await pathology results.                           - Resume Xarelto (rivaroxaban) at prior dose in 3                            days. Refer to managing physician for further                            adjustment of therapy.  Ladene Artist, MD 07/26/2019 2:38:34 PM This report has been signed electronically.

## 2019-07-26 NOTE — Patient Instructions (Signed)
YOU HAD AN ENDOSCOPIC PROCEDURE TODAY AT THE [facility]:   Refer to the procedure report that was given to you for any specific questions about what was found during the examination.  If the procedure report does not answer your questions, please call your gastroenterologist to clarify.  If you requested that your care partner not be given the details of your procedure findings, then the procedure report has been included in a sealed envelope for you to review at your convenience later.  YOU SHOULD EXPECT: Some feelings of bloating in the abdomen. Passage of more gas than usual.  Walking can help get rid of the air that was put into your GI tract during the procedure and reduce the bloating. If you had a lower endoscopy (such as a colonoscopy or flexible sigmoidoscopy) you may notice spotting of blood in your stool or on the toilet paper. If you underwent a bowel prep for your procedure, you may not have a normal bowel movement for a few days.  Please Note:  You might notice some irritation and congestion in your nose or some drainage.  This is from the oxygen used during your procedure.  There is no need for concern and it should clear up in a day or so.  SYMPTOMS TO REPORT IMMEDIATELY:   Following lower endoscopy (colonoscopy or flexible sigmoidoscopy):  Excessive amounts of blood in the stool  Significant tenderness or worsening of abdominal pains  Swelling of the abdomen that is new, acute  Fever of 100F or higher  For urgent or emergent issues, a gastroenterologist can be reached at any hour by calling [phone]. Do not use MyChart messaging for urgent concerns.    DIET:  We do recommend a small meal at first, but then you may proceed to your regular diet.  Drink plenty of fluids but you should avoid alcoholic beverages for 24 hours.  ACTIVITY:  You should plan to take it easy for the rest of today and you should NOT DRIVE or use heavy machinery until tomorrow (because  of the sedation medicines used during the test).    FOLLOW UP: Our staff will call the number listed on your records 48-72 hours following your procedure to check on you and address any questions or concerns that you may have regarding the information given to you following your procedure. If we do not reach you, we will leave a message.  We will attempt to reach you two times.  During this call, we will ask if you have developed any symptoms of COVID 19. If you develop any symptoms (ie: fever, flu-like symptoms, shortness of breath, cough etc.) before then, please call [phone].  If you test positive for Covid 19 in the 2 weeks post procedure, please call and report this information to us.    If any biopsies were taken you will be contacted by phone or by letter within the next 1-3 weeks.  Please call us at [phone] if you have not heard about the biopsies in 3 weeks.    SIGNATURES/CONFIDENTIALITY: You and/or your care partner have signed paperwork which will be entered into your electronic medical record.  These signatures attest to the fact that that the information above on your After Visit Summary has been reviewed and is understood.  Full responsibility of the confidentiality of this discharge information lies with you and/or your care-partner. 

## 2019-07-26 NOTE — Progress Notes (Signed)
Temp JB V/s DT  

## 2019-07-28 ENCOUNTER — Telehealth: Payer: Self-pay

## 2019-07-28 NOTE — Telephone Encounter (Signed)
  Follow up Call-  Call back number 07/26/2019  Post procedure Call Back phone  # [phone]  Permission to leave phone message Yes  Some recent data might be hidden     Patient questions:  Do you have a fever, pain , or abdominal swelling? No. Pain Score  0 *  Have you tolerated food without any problems? Yes.    Have you been able to return to your normal activities? Yes.    Do you have any questions about your discharge instructions: Diet   No. Medications  No. Follow up visit  No.  Do you have questions or concerns about your Care? No.  Actions: * If pain score is 4 or above: No action needed, pain <4. 1. Have you developed a fever since your procedure? no  2.   Have you had an respiratory symptoms (SOB or cough) since your procedure? no  3.   Have you tested positive for COVID 19 since your procedure no  4.   Have you had any family members/close contacts diagnosed with the COVID 19 since your procedure?  no   If yes to any of these questions please route to Joylene John, RN and Erenest Rasher, RN

## 2019-07-28 NOTE — Telephone Encounter (Signed)
Left message on follow up call. 

## 2019-08-03 ENCOUNTER — Encounter: Payer: Self-pay | Admitting: Gastroenterology

## 2019-08-30 ENCOUNTER — Ambulatory Visit (INDEPENDENT_AMBULATORY_CARE_PROVIDER_SITE_OTHER): Payer: Medicare Other | Admitting: *Deleted

## 2019-08-30 DIAGNOSIS — I255 Ischemic cardiomyopathy: Secondary | ICD-10-CM

## 2019-08-30 LAB — CUP PACEART REMOTE DEVICE CHECK
Battery Remaining Longevity: 68 mo
Battery Remaining Percentage: 67 %
Battery Voltage: 2.96 V
Brady Statistic RV Percent Paced: 3.3 %
Date Time Interrogation Session: 20210609080544
HighPow Impedance: 49 Ohm
HighPow Impedance: 49 Ohm
Implantable Lead Implant Date: 20080527
Implantable Lead Location: 753860
Implantable Lead Model: 7121
Implantable Pulse Generator Implant Date: 20171201
Lead Channel Impedance Value: 440 Ohm
Lead Channel Pacing Threshold Amplitude: 1.25 V
Lead Channel Pacing Threshold Pulse Width: 0.5 ms
Lead Channel Sensing Intrinsic Amplitude: 12 mV
Lead Channel Setting Pacing Amplitude: 2.5 V
Lead Channel Setting Pacing Pulse Width: 0.5 ms
Lead Channel Setting Sensing Sensitivity: 0.5 mV
Pulse Gen Serial Number: 7283394

## 2019-08-31 NOTE — Progress Notes (Signed)
Remote ICD transmission.   

## 2019-09-12 ENCOUNTER — Ambulatory Visit (INDEPENDENT_AMBULATORY_CARE_PROVIDER_SITE_OTHER): Payer: Medicare Other | Admitting: Internal Medicine

## 2019-09-12 ENCOUNTER — Encounter: Payer: Self-pay | Admitting: Internal Medicine

## 2019-09-12 ENCOUNTER — Other Ambulatory Visit: Payer: Self-pay

## 2019-09-12 VITALS — BP 124/78 | HR 62 | Temp 97.2°F | Wt 195.0 lb

## 2019-09-12 DIAGNOSIS — R61 Generalized hyperhidrosis: Secondary | ICD-10-CM | POA: Diagnosis not present

## 2019-09-12 LAB — COMPREHENSIVE METABOLIC PANEL
ALT: 18 U/L (ref 0–53)
AST: 15 U/L (ref 0–37)
Albumin: 4.4 g/dL (ref 3.5–5.2)
Alkaline Phosphatase: 50 U/L (ref 39–117)
BUN: 18 mg/dL (ref 6–23)
CO2: 28 mEq/L (ref 19–32)
Calcium: 9.5 mg/dL (ref 8.4–10.5)
Chloride: 105 mEq/L (ref 96–112)
Creatinine, Ser: 1.58 mg/dL — ABNORMAL HIGH (ref 0.40–1.50)
GFR: 52.47 mL/min — ABNORMAL LOW (ref >60.00)
Glucose, Bld: 125 mg/dL — ABNORMAL HIGH (ref 70–99)
Potassium: 4.6 mEq/L (ref 3.5–5.1)
Sodium: 137 mEq/L (ref 135–145)
Total Bilirubin: 0.9 mg/dL (ref 0.2–1.2)
Total Protein: 7.2 g/dL (ref 6.0–8.3)

## 2019-09-12 LAB — CBC
HCT: 40.5 % (ref 39.0–52.0)
Hemoglobin: 13.5 g/dL (ref 13.0–17.0)
MCHC: 33.4 g/dL (ref 30.0–36.0)
MCV: 92.3 fl (ref 78.0–100.0)
Platelets: 181 10*3/uL (ref 150.0–400.0)
RBC: 4.39 Mil/uL (ref 4.22–5.81)
RDW: 13.3 % (ref 11.5–15.5)
WBC: 6.2 10*3/uL (ref 4.0–10.5)

## 2019-09-12 LAB — TSH: TSH: 2.62 u[IU]/mL (ref 0.35–4.50)

## 2019-09-12 NOTE — Progress Notes (Signed)
Subjective:    Patient ID: Brad Brown, male    DOB: 06/25/1945, 74 y.o.   MRN: [id number]  HPI  Pt presents to the clinic today with c/o night sweats. He reports this has been occurring nightly for the last 2 weeks. He does not have any issues with excessive sweating during the day. He denies issues with fatigue, weight loss, poor appetite, nausea, vomiting, diarrhea or blood in the stool. He denies urinary complaints. He denies recent changes in diet or medications. He denies increase in stress, anxiety or depression. He reports he sleeps with it cooler in his house than most people he feels like. He has not taken anything OTC for these symptoms.  Review of Systems  Past Medical History:  Diagnosis Date  . Acute myocardial infarction ([place]) 06/1983  . Arthritis   . Atrial fibrillation ([place])   . Automatic implantable cardiac defibrillator in situ   . Borderline diabetes mellitus   . BPH without obstruction/lower urinary tract symptoms   . CAD (coronary artery disease)   . Chronic kidney disease, stage 1   . Colonic polyp 2004   hyperplastic   . COPD (chronic obstructive pulmonary disease) ([employer])   . Diabetes mellitus without complication ([place])   . Fall   . GERD (gastroesophageal reflux disease)   . History of renal insufficiency syndrome   . HTN (hypertension)   . Hyperlipidemia   . Knee derangement 09/2018   LEFT KNEE  . Monoclonal gammopathy   . 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 Seizure disorder ([place])   . 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 acetaminophen (TYLENOL) 325 MG tablet Take 650 mg by mouth 4 (four) times daily as needed.    . baclofen (LIORESAL) 10 MG tablet Take 1 tablet (10 mg total) by mouth 2 (two) times daily as needed for muscle spasms. (Patient not taking: Reported on 07/26/2019) 20 each 0  . bisacodyl (DULCOLAX) 10 MG suppository Place 1 suppository (10 mg  total) rectally daily as needed for moderate constipation. (Patient not taking: Reported on 07/26/2019) 12 suppository 0  . budesonide-formoterol (SYMBICORT) 160-4.5 MCG/ACT inhaler Inhale 2 puffs into the lungs 2 (two) times daily. 10.2 g 1  . ciclopirox (LOPROX) 0.77 % cream Apply 1 application topically 2 (two) times daily as needed (for rash).     . Clotrimazole 1 % OINT Apply 1 application topically daily. (Patient not taking: Reported on 07/26/2019) 30 g 0  . glipiZIDE (GLUCOTROL) 5 MG tablet Take 1 tablet (5 mg total) by mouth 2 (two) times daily before a meal. MUST SCHEDULE ANNUAL EXAM 180 tablet 0  . HYDROcodone-acetaminophen (NORCO) 5-325 MG tablet Take 1-2 tablets by mouth every 6 (six) hours as needed for severe pain (Take tylenol for mild and moderate pain). (Patient not taking: Reported on 07/26/2019) 30 tablet 0  . lactulose (CHRONULAC) 10 GM/15ML solution Take 20 g by mouth 2 (two) times daily as needed for mild constipation.    . levETIRAcetam (KEPPRA) 500 MG tablet Take 500-1,000 mg by mouth See admin instructions. Take one tablet by mouth in the morning and two tablets by mouth at night    . losartan (COZAAR) 100 MG tablet Take 50 mg by mouth daily.    . metoprolol tartrate (LOPRESSOR) 25 MG tablet Take 12.5 mg by mouth 2 (two) times daily.    . Multiple Vitamin (MULTIVITAMIN)  tablet Take 1 tablet by mouth daily.      . naftifine (NAFTIN) 1 % cream Apply 1 application topically 2 (two) times daily as needed (For  infection between toes).     . Niacin, Antihyperlipidemic, 500 MG TABS Take 500 mg by mouth daily. 30 tablet 2  . NIFEdipine (PROCARDIA-XL/ADALAT CC) 30 MG 24 hr tablet Take 30 mg by mouth daily. "PRN"    . Omega-3 Fatty Acids (FISH OIL) 1200 MG CAPS Take 1,200 mg by mouth daily.     . ondansetron (ZOFRAN) 4 MG tablet Take 1 tablet (4 mg total) by mouth every 6 (six) hours as needed for nausea. 20 tablet 0  . polyethylene glycol (MIRALAX / GLYCOLAX) 17 g packet Take 17 g by  mouth 2 (two) times daily. (Patient not taking: Reported on 07/26/2019) 14 each 0  . Rivaroxaban (XARELTO) 20 MG TABS tablet Take 20 mg by mouth daily.     Marland Kitchen senna (SENOKOT) 8.6 MG TABS tablet Take 1 tablet (8.6 mg total) by mouth 2 (two) times daily. (Patient not taking: Reported on 07/26/2019) 120 tablet 0  . simvastatin (ZOCOR) 20 MG tablet Take 10 mg by mouth at bedtime.     . tamsulosin (FLOMAX) 0.4 MG CAPS capsule Take 0.4 mg by mouth daily.  5   No current facility-administered medications for this visit.    Allergies  Allergen Reactions  . Lipitor [Atorvastatin] Rash    Family History  Problem Relation Age of Onset  . Colon cancer Brother   . Heart attack Mother   . Heart attack Brother   . Seizures Neg Hx     Social History   Socioeconomic History  . Marital status: Single    Spouse name: Not on file  . Number of children: 1  . Years of education: Not on file  . Highest education level: Not on file  Occupational History  . Occupation: retired    Fish farm manager: RETIRED  Tobacco Use  . Smoking status: Former Smoker    Packs/day: 0.50    Years: 15.00    Pack years: 7.50    Types: Cigarettes    Quit date: 03/23/2004    Years since quitting: 15.4  . Smokeless tobacco: Never Used  Vaping Use  . Vaping Use: Never used  Substance and Sexual Activity  . Alcohol use: Yes    Alcohol/week: 1.0 standard drink    Types: 1 Glasses of wine per week    Comment: rare--wine  . Drug use: No  . Sexual activity: Not Currently  Other Topics Concern  . Not on file  Social History Narrative   ICD-St. Jude  Remote-Yes      Live currently @ [facility] for rehabilitation. Plans to discharge to his daughter's house for continuation of rehab.   Social Determinants of Health   Financial Resource Strain:   . Difficulty of Paying Living Expenses:   Food Insecurity:   . Worried About Charity fundraiser in the Last Year:   . Arboriculturist in the Last Year:   Transportation Needs:    . Film/video editor (Medical):   Marland Kitchen Lack of Transportation (Non-Medical):   Physical Activity:   . Days of Exercise per Week:   . Minutes of Exercise per Session:   Stress:   . Feeling of Stress :   Social Connections:   . Frequency of Communication with Friends and Family:   . Frequency of Social Gatherings with Friends and Family:   .  Attends Religious Services:   . Active Member of Clubs or Organizations:   . Attends Archivist Meetings:   Marland Kitchen Marital Status:   Intimate Partner Violence:   . Fear of Current or Ex-Partner:   . Emotionally Abused:   Marland Kitchen Physically Abused:   . Sexually Abused:      Constitutional: Denies fever, malaise, fatigue, headache or abrupt weight changes.  HEENT: Denies eye pain, eye redness, ear pain, ringing in the ears, wax buildup, runny nose, nasal congestion, bloody nose, or sore throat. Respiratory: Denies difficulty breathing, shortness of breath, cough or sputum production.   Cardiovascular: Denies chest pain, chest tightness, palpitations or swelling in the hands or feet.  Gastrointestinal: Denies abdominal pain, bloating, constipation, diarrhea or blood in the stool.  GU: Denies urgency, frequency, pain with urination, burning sensation, blood in urine, odor or discharge. Musculoskeletal: Denies decrease in range of motion, difficulty with gait, muscle pain or joint pain and swelling.  Skin: Pt reports night sweats. Denies redness, rashes, lesions or ulcercations.  Neurological: Denies dizziness, difficulty with memory, difficulty with speech or problems with balance and coordination.  Psych: Denies anxiety, depression, SI/HI.  No other specific complaints in a complete review of systems (except as listed in HPI above).     Objective:   Physical Exam   BP 124/78   Pulse 62   Temp (!) 97.2 F (36.2 C) (Temporal)   Wt 195 lb (88.5 kg)   SpO2 97%   BMI 28.80 kg/m   Wt Readings from Last 3 Encounters:  07/26/19 194 lb (88  kg)  07/06/19 194 lb (88 kg)  06/12/19 195 lb (88.5 kg)    General: Appears his stated age, well developed, well nourished in NAD. Skin: Warm, dry and intact. No rashes noted. Neck:  Neck supple, trachea midline. No masses, lumps or thyromegaly present.  Cardiovascular: Normal rate and rhythm. No JVD or BLE edema.  Pulmonary/Chest: Normal effort and positive vesicular breath sounds. No respiratory distress. No wheezes, rales or ronchi noted.  Musculoskeletal: No difficulty with gait.  Neurological: Alert and oriented.    BMET    Component Value Date/Time   NA 135 05/30/2019 1153   NA 140 01/30/2016 1129   K 4.6 05/30/2019 1153   K 4.4 01/30/2016 1129   CL 103 05/30/2019 1153   CL 104 01/06/2012 1355   CO2 28 05/30/2019 1153   CO2 26 01/30/2016 1129   GLUCOSE 112 (H) 05/30/2019 1153   GLUCOSE 191 (H) 01/30/2016 1129   GLUCOSE 77 01/06/2012 1355   BUN 15 05/30/2019 1153   BUN 15.0 01/30/2016 1129   CREATININE 1.44 05/30/2019 1153   CREATININE 1.86 (H) 02/03/2019 1106   CREATININE 1.29 (H) 02/17/2016 1011   CREATININE 1.4 (H) 01/30/2016 1129   CALCIUM 9.4 05/30/2019 1153   CALCIUM 9.4 01/30/2016 1129   GFRNONAA 36 (L) 02/03/2019 1106   GFRAA 41 (L) 02/03/2019 1106    Lipid Panel     Component Value Date/Time   CHOL 134 05/30/2019 1153   TRIG 252.0 (H) 05/30/2019 1153   HDL 42.00 05/30/2019 1153   CHOLHDL 3 05/30/2019 1153   VLDL 50.4 (H) 05/30/2019 1153   LDLCALC 47 04/21/2018 0942    CBC    Component Value Date/Time   WBC 5.2 05/30/2019 1153   RBC 4.43 05/30/2019 1153   HGB 13.8 05/30/2019 1153   HGB 13.5 02/03/2019 1106   HGB 13.5 01/29/2017 0948   HCT 40.6 05/30/2019 1153  HCT 40.9 01/29/2017 0948   PLT 188.0 05/30/2019 1153   PLT 213 02/03/2019 1106   PLT 187 01/29/2017 0948   MCV 91.7 05/30/2019 1153   MCV 91.7 01/29/2017 0948   MCH 29.5 02/03/2019 1106   MCHC 34.1 05/30/2019 1153   RDW 12.7 05/30/2019 1153   RDW 12.5 01/29/2017 0948    LYMPHSABS 1.5 02/03/2019 1106   LYMPHSABS 1.1 01/29/2017 0948   MONOABS 0.4 02/03/2019 1106   MONOABS 0.2 01/29/2017 0948   EOSABS 0.1 02/03/2019 1106   EOSABS 0.1 01/29/2017 0948   BASOSABS 0.0 02/03/2019 1106   BASOSABS 0.0 01/29/2017 0948    Hgb A1C Lab Results  Component Value Date   HGBA1C 6.2 05/30/2019          Assessment & Plan:   Night Sweats:  Exam benign Will check CBC, CMET and TSH today  Will follow up after labs with further recommendations, return precautions discussed Webb Silversmith, NP

## 2019-10-12 DIAGNOSIS — N1831 Chronic kidney disease, stage 3a: Secondary | ICD-10-CM | POA: Diagnosis not present

## 2019-10-12 DIAGNOSIS — I480 Paroxysmal atrial fibrillation: Secondary | ICD-10-CM | POA: Diagnosis not present

## 2019-10-12 DIAGNOSIS — I129 Hypertensive chronic kidney disease with stage 1 through stage 4 chronic kidney disease, or unspecified chronic kidney disease: Secondary | ICD-10-CM | POA: Diagnosis not present

## 2019-10-12 DIAGNOSIS — D472 Monoclonal gammopathy: Secondary | ICD-10-CM | POA: Diagnosis not present

## 2019-11-29 ENCOUNTER — Ambulatory Visit (INDEPENDENT_AMBULATORY_CARE_PROVIDER_SITE_OTHER): Payer: Medicare Other | Admitting: *Deleted

## 2019-11-29 DIAGNOSIS — I255 Ischemic cardiomyopathy: Secondary | ICD-10-CM | POA: Diagnosis not present

## 2019-11-29 LAB — CUP PACEART REMOTE DEVICE CHECK
Battery Remaining Longevity: 67 mo
Battery Remaining Percentage: 65 %
Battery Voltage: 2.96 V
Brady Statistic RV Percent Paced: 3.3 %
Date Time Interrogation Session: 20210908081610
HighPow Impedance: 51 Ohm
HighPow Impedance: 51 Ohm
Implantable Lead Implant Date: 20080527
Implantable Lead Location: 753860
Implantable Lead Model: 7121
Implantable Pulse Generator Implant Date: 20171201
Lead Channel Impedance Value: 450 Ohm
Lead Channel Pacing Threshold Amplitude: 1.25 V
Lead Channel Pacing Threshold Pulse Width: 0.5 ms
Lead Channel Sensing Intrinsic Amplitude: 12 mV
Lead Channel Setting Pacing Amplitude: 2.5 V
Lead Channel Setting Pacing Pulse Width: 0.5 ms
Lead Channel Setting Sensing Sensitivity: 0.5 mV
Pulse Gen Serial Number: 7283394

## 2019-11-30 NOTE — Progress Notes (Signed)
Remote ICD transmission.   

## 2019-12-15 ENCOUNTER — Encounter: Payer: Self-pay | Admitting: Primary Care

## 2019-12-15 ENCOUNTER — Ambulatory Visit (INDEPENDENT_AMBULATORY_CARE_PROVIDER_SITE_OTHER)
Admission: RE | Admit: 2019-12-15 | Discharge: 2019-12-15 | Disposition: A | Discharge status: Home / Self Care | Payer: Medicare Other | Source: Ambulatory Visit | Attending: Primary Care | Admitting: Primary Care

## 2019-12-15 ENCOUNTER — Ambulatory Visit (INDEPENDENT_AMBULATORY_CARE_PROVIDER_SITE_OTHER): Payer: Medicare Other | Admitting: Primary Care

## 2019-12-15 ENCOUNTER — Other Ambulatory Visit: Payer: Self-pay

## 2019-12-15 VITALS — BP 120/74 | HR 84 | Temp 98.2°F | Ht 69.0 in | Wt 201.0 lb

## 2019-12-15 DIAGNOSIS — M25561 Pain in right knee: Secondary | ICD-10-CM | POA: Diagnosis not present

## 2019-12-15 NOTE — Patient Instructions (Signed)
  Stop by lab & Radiology prior to leaving today. I will notify you of your results once received.    RICE Therapy for Routine Care of Injuries Many injuries can be cared for with rest, ice, compression, and elevation (RICE therapy). This includes:  Resting the injured part.  Putting ice on the injury.  Putting pressure (compression) on the injury.  Raising the injured part (elevation). Using RICE therapy can help to lessen pain and swelling. Supplies needed:  Ice.  Plastic bag.  Towel.  Elastic bandage.  Pillow or pillows to raise (elevate) your injured body part. How to care for your injury with RICE therapy Rest Limit your normal activities, and try not to use the injured part of your body. You can go back to your normal activities when your doctor says it is okay to do them and you feel okay. Ask your doctor if you should do exercises to help your injury get better. Ice Put ice on the injured area. Do not put ice on your bare skin.  Put ice in a plastic bag.  Place a towel between your skin and the bag.  Leave the ice on for 20 minutes, 2-3 times a day. Use ice on as many days as told by your doctor.  Compression Compression means putting pressure on the injured area. This can be done with an elastic bandage. If an elastic bandage has been put on your injury:  Do not wrap the bandage too tight. Wrap the bandage more loosely if part of your body away from the bandage is blue, swollen, cold, painful, or loses feeling (gets numb).  Take off the bandage and put it on again. Do this every 3-4 hours or as told by your doctor.  See your doctor if the bandage seems to make your problems worse.  Elevation Elevation means keeping the injured area raised. If you can, raise the injured area above your heart or the center of your chest. Contact a doctor if:  You keep having pain and swelling.  Your symptoms get worse. Get help right away if:  You have sudden bad pain at  your injury or lower than your injury.  You have redness or more swelling around your injury.  You have tingling or numbness at your injury or lower than your injury, and it does not go away when you take off the bandage. Summary  Many injuries can be cared for using rest, ice, compression, and elevation (RICE therapy).  You can go back to your normal activities when you feel okay and your doctor says it is okay.  Put ice on the injured area as told by your doctor.  Get help if your symptoms get worse or if you keep having pain and swelling. This information is not intended to replace advice given to you by your health care provider. Make sure you discuss any questions you have with your health care provider. Document Revised: 11/27/2016 Document Reviewed: 11/27/2016 Elsevier Patient Education  [place].

## 2019-12-15 NOTE — Assessment & Plan Note (Addendum)
Acute pain to right knee, no trauma. Unclear exact etiology   Will obtain right knee xray. CBC to rule out infectious cause & Uric acid to rule out gout.  Continue Voltaren Gel and "red oil", add in Aleve once daily. Await results.  Agree with assessment and plan. Pleas Koch, NP

## 2019-12-15 NOTE — Progress Notes (Signed)
Subjective:    Patient ID: Brad Brown, male    DOB: 12-Dec-1945, 74 y.o.   MRN: [id number]  HPI  This visit occurred during the SARS-CoV-2 public health emergency.  Safety protocols were in place, including screening questions prior to the visit, additional usage of staff PPE, and extensive cleaning of exam room while observing appropriate contact time as indicated for disinfecting solutions.   Brad Brown is a 74 year old male patient of [facility] with a history of CVA, atrial fibrillation, type 2 diabetes, COPD, osteoarthritis, seizures, BPH who presents today with a chief complaint of knee pain.  History of knee injury one year ago, tore his left ACL. His pain is located to the right anterior knee pain which began about two weeks ago while walking his dog. His knee "locked" up during this episode. Also with stiffness to the knee with inability to extend for full range motion. His ROM improved about one week ago.   He denies recent injury/trauma, fevers, redness, swelling. He's been using Voltaren Gel and "red oil" with temporary improvement.   Review of Systems  Constitutional: Negative for fever.  Musculoskeletal: Positive for arthralgias. Negative for joint swelling.  Skin: Positive for color change.       Past Medical History:  Diagnosis Date  . Acute myocardial infarction ([place]) 06/1983  . Arthritis   . Atrial fibrillation ([place])   . Automatic implantable cardiac defibrillator in situ   . Borderline diabetes mellitus   . BPH without obstruction/lower urinary tract symptoms   . CAD (coronary artery disease)   . Chronic kidney disease, stage 1   . Colonic polyp 2004   hyperplastic   . COPD (chronic obstructive pulmonary disease) ([place])   . Diabetes mellitus without complication ([place])   . Fall   . GERD (gastroesophageal reflux disease)   . History of renal insufficiency syndrome   . HTN (hypertension)   . Hyperlipidemia   . Knee derangement 09/2018   LEFT KNEE   . Monoclonal gammopathy   . 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 Seizure disorder ([place])   . Stroke [facility])    pt reports he has had 3     Social History   Socioeconomic History  . Marital status: Single    Spouse name: Not on file  . Number of children: 1  . Years of education: Not on file  . Highest education level: Not on file  Occupational History  . Occupation: retired    Fish farm manager: RETIRED  Tobacco Use  . Smoking status: Former Smoker    Packs/day: 0.50    Years: 15.00    Pack years: 7.50    Types: Cigarettes    Quit date: 03/23/2004    Years since quitting: 15.7  . Smokeless tobacco: Never Used  Vaping Use  . Vaping Use: Never used  Substance and Sexual Activity  . Alcohol use: Yes    Alcohol/week: 1.0 standard drink    Types: 1 Glasses of wine per week    Comment: rare--wine  . Drug use: No  . Sexual activity: Not Currently  Other Topics Concern  . Not on file  Social History Narrative   ICD-St. Jude  Remote-Yes      Live currently @ [facility] for rehabilitation. Plans to discharge to his daughter's house for continuation of rehab.   Social Determinants of Health   Financial Resource Strain:   . Difficulty of Paying Living Expenses: Not on file  Food  Insecurity:   . Worried About Charity fundraiser in the Last Year: Not on file  . Ran Out of Food in the Last Year: Not on file  Transportation Needs:   . Lack of Transportation (Medical): Not on file  . Lack of Transportation (Non-Medical): Not on file  Physical Activity:   . Days of Exercise per Week: Not on file  . Minutes of Exercise per Session: Not on file  Stress:   . Feeling of Stress : Not on file  Social Connections:   . Frequency of Communication with Friends and Family: Not on file  . Frequency of Social Gatherings with Friends and Family: Not on file  . Attends Religious Services: Not on file  . Active Member of Clubs or Organizations: Not on file  . Attends Theatre manager Meetings: Not on file  . Marital Status: Not on file  Intimate Partner Violence:   . Fear of Current or Ex-Partner: Not on file  . Emotionally Abused: Not on file  . Physically Abused: Not on file  . Sexually Abused: Not on file    Past Surgical History:  Procedure Laterality Date  . CARDIAC CATHETERIZATION  1987   Showed distal left circumflex 100% occluded   . CARDIAC DEFIBRILLATOR PLACEMENT  08/17/2006   Implantation of a St. Jude single chamber defibrillator  . COLONOSCOPY  07/26/2019  . EP IMPLANTABLE DEVICE N/A 02/21/2016   Procedure: ICD Generator Changeout;  Surgeon: Evans Lance, MD;  Location: [facility];  Service: Cardiovascular;  Laterality: N/A;  . INGUINAL HERNIA REPAIR Left   . QUADRICEPS TENDON REPAIR Left 10/20/2018   Procedure: REPAIR QUADRICEP TENDON;  Surgeon: Renette Butters, MD;  Location: [facility];  Service: Orthopedics;  Laterality: Left;    Family History  Problem Relation Age of Onset  . Colon cancer Brother   . Heart attack Mother   . Heart attack Brother   . Seizures Neg Hx     Allergies  Allergen Reactions  . Lipitor [Atorvastatin] Rash    Current Outpatient Medications on File Prior to Visit  Medication Sig Dispense Refill  . acetaminophen (TYLENOL) 325 MG tablet Take 650 mg by mouth 4 (four) times daily as needed.    . baclofen (LIORESAL) 10 MG tablet Take 1 tablet (10 mg total) by mouth 2 (two) times daily as needed for muscle spasms. 20 each 0  . bisacodyl (DULCOLAX) 10 MG suppository Place 1 suppository (10 mg total) rectally daily as needed for moderate constipation. 12 suppository 0  . budesonide-formoterol (SYMBICORT) 160-4.5 MCG/ACT inhaler Inhale 2 puffs into the lungs 2 (two) times daily. 10.2 g 1  . ciclopirox (LOPROX) 0.77 % cream Apply 1 application topically 2 (two) times daily as needed (for rash).     . Clotrimazole 1 % OINT Apply 1 application topically daily. 30 g 0  . glipiZIDE (GLUCOTROL) 5 MG tablet Take  1 tablet (5 mg total) by mouth 2 (two) times daily before a meal. MUST SCHEDULE ANNUAL EXAM 180 tablet 0  . HYDROcodone-acetaminophen (NORCO) 5-325 MG tablet Take 1-2 tablets by mouth every 6 (six) hours as needed for severe pain (Take tylenol for mild and moderate pain). 30 tablet 0  . lactulose (CHRONULAC) 10 GM/15ML solution Take 20 g by mouth 2 (two) times daily as needed for mild constipation.    . levETIRAcetam (KEPPRA) 500 MG tablet Take 500-1,000 mg by mouth See admin instructions. Take one tablet by mouth in the morning  and two tablets by mouth at night    . losartan (COZAAR) 100 MG tablet Take 50 mg by mouth daily.    . metoprolol tartrate (LOPRESSOR) 25 MG tablet Take 12.5 mg by mouth 2 (two) times daily.    . Multiple Vitamin (MULTIVITAMIN) tablet Take 1 tablet by mouth daily.      . naftifine (NAFTIN) 1 % cream Apply 1 application topically 2 (two) times daily as needed (For  infection between toes).     . Niacin, Antihyperlipidemic, 500 MG TABS Take 500 mg by mouth daily. 30 tablet 2  . NIFEdipine (PROCARDIA-XL/ADALAT CC) 30 MG 24 hr tablet Take 30 mg by mouth daily. "PRN"    . Omega-3 Fatty Acids (FISH OIL) 1200 MG CAPS Take 1,200 mg by mouth daily.     . ondansetron (ZOFRAN) 4 MG tablet Take 1 tablet (4 mg total) by mouth every 6 (six) hours as needed for nausea. 20 tablet 0  . polyethylene glycol (MIRALAX / GLYCOLAX) 17 g packet Take 17 g by mouth 2 (two) times daily. 14 each 0  . Rivaroxaban (XARELTO) 20 MG TABS tablet Take 20 mg by mouth daily.     Marland Kitchen senna (SENOKOT) 8.6 MG TABS tablet Take 1 tablet (8.6 mg total) by mouth 2 (two) times daily. 120 tablet 0  . simvastatin (ZOCOR) 20 MG tablet Take 10 mg by mouth at bedtime.     . tamsulosin (FLOMAX) 0.4 MG CAPS capsule Take 0.4 mg by mouth daily.  5   No current facility-administered medications on file prior to visit.    BP 120/74   Pulse 84   Temp 98.2 F (36.8 C) (Temporal)   Ht 5\' 9"  (1.753 m)   Wt 201 lb (91.2 kg)    SpO2 97%   BMI 29.68 kg/m    Objective:   Physical Exam Musculoskeletal:     Right knee: Swelling and erythema present. No bony tenderness. Normal range of motion.       Legs:     Comments: Mild erythema to anterior right knee. Very slight swelling to right anterior lateral knee. Non tender. Normal ROM. 5/5 strength to bilateral lower extremities.   Skin:    General: Skin is warm and dry.     Comments: Mild erythema to right anterior knee, mild warmth to right knee.   Neurological:     Mental Status: He is alert.            Assessment & Plan:

## 2019-12-15 NOTE — Progress Notes (Signed)
   Subjective:    Patient ID: Brad Brown, male    DOB: [birth year], 74 y.o.   MRN: [id number]  HPI  This visit occurred during the SARS-CoV-2 public health emergency.  Safety protocols were in place, including screening questions prior to the visit, additional usage of staff PPE, and extensive cleaning of exam room while observing appropriate contact time as indicated for disinfecting solutions.   Brad Brown is a 74 year old male with a history of hypertension, atrial fibrillation, cerebral artery occulusion, coronary atherosclerosis, type 2 diabetes, arthritis, hyperlipidemia and seziures presents today with right knee pain.   This pain began in his right anterior knee 2 weeks ago while walking his dog. His right knee felt like it locked and was unable to bend it fully. Over the last one week his range of motion returned to the knee but is still having pain. Pain is worse with weight bearing. Current pain 6/10. He has been using Voltaren cream which does decrease his pain level. He has also been using OTC "Red Oil" with some relief. He denies any known trauma to this knee. Denies any fevers or chills.     Review of Systems  Constitutional: Negative.   Respiratory: Negative.   Cardiovascular: Negative.   Musculoskeletal: Positive for joint swelling. Negative for gait problem.  Skin: Negative.  Negative for color change and rash.  Allergic/Immunologic: Negative.   Neurological: Negative.  Negative for weakness.  Hematological: Negative.        Objective:   Physical Exam Constitutional:      Appearance: Normal appearance.  Pulmonary:     Effort: Pulmonary effort is normal.  Musculoskeletal:     Right lower leg: Swelling present. No deformity or tenderness. No edema.       Legs:  Skin:    General: Skin is warm and dry.     Capillary Refill: Capillary refill takes less than 2 seconds.     Findings: No erythema.  Neurological:     General: No focal deficit present.      Mental Status: He is alert and oriented to person, place, and time.  Psychiatric:        Mood and Affect: Mood normal.        Behavior: Behavior normal.           Assessment & Plan:

## 2019-12-16 LAB — CBC WITH DIFFERENTIAL/PLATELET
Absolute Monocytes: 554 cells/uL (ref 200–950)
Basophils Absolute: 62 cells/uL (ref 0–200)
Basophils Relative: 0.8 %
Eosinophils Absolute: 62 cells/uL (ref 15–500)
Eosinophils Relative: 0.8 %
HCT: 38.9 % (ref 38.5–50.0)
Hemoglobin: 12.9 g/dL — ABNORMAL LOW (ref 13.2–17.1)
Lymphs Abs: 1494 cells/uL (ref 850–3900)
MCH: 30.8 pg (ref 27.0–33.0)
MCHC: 33.2 g/dL (ref 32.0–36.0)
MCV: 92.8 fL (ref 80.0–100.0)
MPV: 12.4 fL (ref 7.5–12.5)
Monocytes Relative: 7.2 %
Neutro Abs: 5529 cells/uL (ref 1500–7800)
Neutrophils Relative %: 71.8 %
Platelets: 175 10*3/uL (ref 140–400)
RBC: 4.19 10*6/uL — ABNORMAL LOW (ref 4.20–5.80)
RDW: 12.2 % (ref 11.0–15.0)
Total Lymphocyte: 19.4 %
WBC: 7.7 10*3/uL (ref 3.8–10.8)

## 2019-12-16 LAB — URIC ACID: Uric Acid, Serum: 5.3 mg/dL (ref 4.0–8.0)

## 2020-01-29 DIAGNOSIS — D472 Monoclonal gammopathy: Secondary | ICD-10-CM | POA: Diagnosis not present

## 2020-01-29 DIAGNOSIS — I129 Hypertensive chronic kidney disease with stage 1 through stage 4 chronic kidney disease, or unspecified chronic kidney disease: Secondary | ICD-10-CM | POA: Diagnosis not present

## 2020-01-29 DIAGNOSIS — N1831 Chronic kidney disease, stage 3a: Secondary | ICD-10-CM | POA: Diagnosis not present

## 2020-01-29 DIAGNOSIS — I480 Paroxysmal atrial fibrillation: Secondary | ICD-10-CM | POA: Diagnosis not present

## 2020-02-02 ENCOUNTER — Inpatient Hospital Stay: Payer: Medicare Other | Attending: Oncology

## 2020-02-02 ENCOUNTER — Other Ambulatory Visit: Payer: Self-pay

## 2020-02-02 DIAGNOSIS — Z79899 Other long term (current) drug therapy: Secondary | ICD-10-CM | POA: Diagnosis not present

## 2020-02-02 DIAGNOSIS — D472 Monoclonal gammopathy: Secondary | ICD-10-CM

## 2020-02-02 DIAGNOSIS — Z7901 Long term (current) use of anticoagulants: Secondary | ICD-10-CM | POA: Diagnosis not present

## 2020-02-02 DIAGNOSIS — N289 Disorder of kidney and ureter, unspecified: Secondary | ICD-10-CM | POA: Diagnosis not present

## 2020-02-02 LAB — CMP (CANCER CENTER ONLY)
ALT: 30 U/L (ref 0–44)
AST: 21 U/L (ref 15–41)
Albumin: 4 g/dL (ref 3.5–5.0)
Alkaline Phosphatase: 56 U/L (ref 38–126)
Anion gap: 6 (ref 5–15)
BUN: 11 mg/dL (ref 8–23)
CO2: 25 mmol/L (ref 22–32)
Calcium: 9.2 mg/dL (ref 8.9–10.3)
Chloride: 106 mmol/L (ref 98–111)
Creatinine: 1.39 mg/dL — ABNORMAL HIGH (ref 0.61–1.24)
GFR, Estimated: 54 mL/min — ABNORMAL LOW (ref >60)
Glucose, Bld: 163 mg/dL — ABNORMAL HIGH (ref 70–99)
Potassium: 4.4 mmol/L (ref 3.5–5.1)
Sodium: 137 mmol/L (ref 135–145)
Total Bilirubin: 1.2 mg/dL (ref 0.3–1.2)
Total Protein: 7.4 g/dL (ref 6.5–8.1)

## 2020-02-02 LAB — CBC WITH DIFFERENTIAL (CANCER CENTER ONLY)
Abs Immature Granulocytes: 0.01 10*3/uL (ref 0.00–0.07)
Basophils Absolute: 0 10*3/uL (ref 0.0–0.1)
Basophils Relative: 1 %
Eosinophils Absolute: 0 10*3/uL (ref 0.0–0.5)
Eosinophils Relative: 1 %
HCT: 38.6 % — ABNORMAL LOW (ref 39.0–52.0)
Hemoglobin: 12.9 g/dL — ABNORMAL LOW (ref 13.0–17.0)
Immature Granulocytes: 0 %
Lymphocytes Relative: 26 %
Lymphs Abs: 1 10*3/uL (ref 0.7–4.0)
MCH: 30.9 pg (ref 26.0–34.0)
MCHC: 33.4 g/dL (ref 30.0–36.0)
MCV: 92.6 fL (ref 80.0–100.0)
Monocytes Absolute: 0.3 10*3/uL (ref 0.1–1.0)
Monocytes Relative: 7 %
Neutro Abs: 2.4 10*3/uL (ref 1.7–7.7)
Neutrophils Relative %: 65 %
Platelet Count: 168 10*3/uL (ref 150–400)
RBC: 4.17 MIL/uL — ABNORMAL LOW (ref 4.22–5.81)
RDW: 12.7 % (ref 11.5–15.5)
WBC Count: 3.7 10*3/uL — ABNORMAL LOW (ref 4.0–10.5)
nRBC: 0 % (ref 0.0–0.2)

## 2020-02-05 ENCOUNTER — Telehealth: Payer: Self-pay

## 2020-02-05 LAB — MULTIPLE MYELOMA PANEL, SERUM
Albumin SerPl Elph-Mcnc: 4 g/dL (ref 2.9–4.4)
Albumin/Glob SerPl: 1.4 (ref 0.7–1.7)
Alpha 1: 0.2 g/dL (ref 0.0–0.4)
Alpha2 Glob SerPl Elph-Mcnc: 0.6 g/dL (ref 0.4–1.0)
B-Globulin SerPl Elph-Mcnc: 0.8 g/dL (ref 0.7–1.3)
Gamma Glob SerPl Elph-Mcnc: 1.3 g/dL (ref 0.4–1.8)
Globulin, Total: 2.9 g/dL (ref 2.2–3.9)
IgA: 36 mg/dL — ABNORMAL LOW (ref 61–437)
IgG (Immunoglobin G), Serum: 1467 mg/dL (ref 603–1613)
IgM (Immunoglobulin M), Srm: 15 mg/dL (ref 15–143)
M Protein SerPl Elph-Mcnc: 1 g/dL — ABNORMAL HIGH
Total Protein ELP: 6.9 g/dL (ref 6.0–8.5)

## 2020-02-05 LAB — KAPPA/LAMBDA LIGHT CHAINS
Kappa free light chain: 109.3 mg/L — ABNORMAL HIGH (ref 3.3–19.4)
Kappa, lambda light chain ratio: 14.97 — ABNORMAL HIGH (ref 0.26–1.65)
Lambda free light chains: 7.3 mg/L (ref 5.7–26.3)

## 2020-02-05 NOTE — Telephone Encounter (Signed)
Patient walked in to advise [facility] that his medicine glipiZIDE (GLUCOTROL) 5 MG tablet has increased to 10 MG, and he has two medications to add Metformin HCL 500MG  and Cholecif 50 MCG.   Instructions for Metformin: take one tablet by mouth daily for 20 days then take two tablets for diabetes    Cholecif: Take one tablet by mouth for low vitamin D.  Asked for a phone call if any questions and has requested for a copy of medication list to be sent once this is updated.

## 2020-02-09 ENCOUNTER — Inpatient Hospital Stay: Payer: Medicare Other | Admitting: Oncology

## 2020-02-09 ENCOUNTER — Other Ambulatory Visit: Payer: Self-pay

## 2020-02-09 VITALS — BP 144/78 | HR 68 | Temp 97.7°F | Resp 17 | Ht 69.0 in | Wt 195.4 lb

## 2020-02-09 DIAGNOSIS — Z79899 Other long term (current) drug therapy: Secondary | ICD-10-CM | POA: Diagnosis not present

## 2020-02-09 DIAGNOSIS — D472 Monoclonal gammopathy: Secondary | ICD-10-CM

## 2020-02-09 DIAGNOSIS — N289 Disorder of kidney and ureter, unspecified: Secondary | ICD-10-CM | POA: Diagnosis not present

## 2020-02-09 DIAGNOSIS — Z7901 Long term (current) use of anticoagulants: Secondary | ICD-10-CM | POA: Diagnosis not present

## 2020-02-09 NOTE — Progress Notes (Signed)
Hematology and Oncology Follow Up Visit  Brad [name] [id number] 06/18/1945 74 y.o. 02/09/2020 1:06 PM   Principle Diagnosis: 74 year old man with IgG kappa MGUS diagnosed in 2008.  He was found to have M spike of 1 g/dL with normal quantitative immunoglobulins.  Current therapy: Active surveillance.  Interim History:  Brad [name] is here for return evaluation.  Since her last visit, he reports no major changes in his health.  He denies any recent hospitalization or illnesses.  He denies any falls or syncope.  He denies any bone pain or pathological fractures.  His performance status and quality of life remain excellent.  .   Medications: Unchanged on review. Current Outpatient Medications  Medication Sig Dispense Refill  . acetaminophen (TYLENOL) 325 MG tablet Take 650 mg by mouth 4 (four) times daily as needed.    . baclofen (LIORESAL) 10 MG tablet Take 1 tablet (10 mg total) by mouth 2 (two) times daily as needed for muscle spasms. 20 each 0  . bisacodyl (DULCOLAX) 10 MG suppository Place 1 suppository (10 mg total) rectally daily as needed for moderate constipation. 12 suppository 0  . budesonide-formoterol (SYMBICORT) 160-4.5 MCG/ACT inhaler Inhale 2 puffs into the lungs 2 (two) times daily. 10.2 g 1  . ciclopirox (LOPROX) 0.77 % cream Apply 1 application topically 2 (two) times daily as needed (for rash).     . Clotrimazole 1 % OINT Apply 1 application topically daily. 30 g 0  . glipiZIDE (GLUCOTROL) 5 MG tablet Take 1 tablet (5 mg total) by mouth 2 (two) times daily before a meal. MUST SCHEDULE ANNUAL EXAM 180 tablet 0  . HYDROcodone-acetaminophen (NORCO) 5-325 MG tablet Take 1-2 tablets by mouth every 6 (six) hours as needed for severe pain (Take tylenol for mild and moderate pain). 30 tablet 0  . lactulose (CHRONULAC) 10 GM/15ML solution Take 20 g by mouth 2 (two) times daily as needed for mild constipation.    . levETIRAcetam (KEPPRA) 500 MG tablet Take 500-1,000 mg by  mouth See admin instructions. Take one tablet by mouth in the morning and two tablets by mouth at night    . losartan (COZAAR) 100 MG tablet Take 50 mg by mouth daily.    . metoprolol tartrate (LOPRESSOR) 25 MG tablet Take 12.5 mg by mouth 2 (two) times daily.    . Multiple Vitamin (MULTIVITAMIN) tablet Take 1 tablet by mouth daily.      . naftifine (NAFTIN) 1 % cream Apply 1 application topically 2 (two) times daily as needed (For  infection between toes).     . Niacin, Antihyperlipidemic, 500 MG TABS Take 500 mg by mouth daily. 30 tablet 2  . NIFEdipine (PROCARDIA-XL/ADALAT CC) 30 MG 24 hr tablet Take 30 mg by mouth daily. "PRN"    . Omega-3 Fatty Acids (FISH OIL) 1200 MG CAPS Take 1,200 mg by mouth daily.     . ondansetron (ZOFRAN) 4 MG tablet Take 1 tablet (4 mg total) by mouth every 6 (six) hours as needed for nausea. 20 tablet 0  . polyethylene glycol (MIRALAX / GLYCOLAX) 17 g packet Take 17 g by mouth 2 (two) times daily. 14 each 0  . Rivaroxaban (XARELTO) 20 MG TABS tablet Take 20 mg by mouth daily.     Marland Kitchen senna (SENOKOT) 8.6 MG TABS tablet Take 1 tablet (8.6 mg total) by mouth 2 (two) times daily. 120 tablet 0  . simvastatin (ZOCOR) 20 MG tablet Take 10 mg by mouth at bedtime.     Marland Kitchen  tamsulosin (FLOMAX) 0.4 MG CAPS capsule Take 0.4 mg by mouth daily.  5   No current facility-administered medications for this visit.    Allergies:  Allergies  Allergen Reactions  . Lipitor [Atorvastatin] Rash      Physical Exam:  Blood pressure (!) 144/78, pulse 68, temperature 97.7 F (36.5 C), temperature source Tympanic, resp. rate 17, height _0  (1.753 m), weight 195 lb 6.4 oz (88.6 kg), SpO2 100 %.    ECOG: 0   General appearance: Comfortable appearing without any discomfort Head: Normocephalic without any trauma Oropharynx: Mucous membranes are moist and pink without any thrush or ulcers. Eyes: Pupils are equal and round reactive to light. Lymph nodes: No cervical, supraclavicular,  inguinal or axillary lymphadenopathy.   Heart:regular rate and rhythm.  S1 and S2 without leg edema. Lung: Clear without any rhonchi or wheezes.  No dullness to percussion. Abdomin: Soft, nontender, nondistended with good bowel sounds.  No hepatosplenomegaly. Musculoskeletal: No joint deformity or effusion.  Full range of motion noted. Neurological: No deficits noted on motor, sensory and deep tendon reflex exam. Skin: No petechial rash or dryness.  Appeared moist.   .     Lab Results: Lab Results  Component Value Date   WBC 3.7 (L) 02/02/2020   HGB 12.9 (L) 02/02/2020   HCT 38.6 (L) 02/02/2020   MCV 92.6 02/02/2020   PLT 168 02/02/2020     Chemistry      Component Value Date/Time   NA 137 02/02/2020 1018   NA 140 01/30/2016 1129   K 4.4 02/02/2020 1018   K 4.4 01/30/2016 1129   CL 106 02/02/2020 1018   CL 104 01/06/2012 1355   CO2 25 02/02/2020 1018   CO2 26 01/30/2016 1129   BUN 11 02/02/2020 1018   BUN 15.0 01/30/2016 1129   CREATININE 1.39 (H) 02/02/2020 1018   CREATININE 1.29 (H) 02/17/2016 1011   CREATININE 1.4 (H) 01/30/2016 1129      Component Value Date/Time   CALCIUM 9.2 02/02/2020 1018   CALCIUM 9.4 01/30/2016 1129   ALKPHOS 56 02/02/2020 1018   ALKPHOS 78 01/30/2016 1129   AST 21 02/02/2020 1018   AST 20 01/30/2016 1129   ALT 30 02/02/2020 1018   ALT 28 01/30/2016 1129   BILITOT 1.2 02/02/2020 1018   BILITOT 0.82 01/30/2016 1129        .  Results for Brad [name] (MRN [id number]) as of 02/09/2020 12:50  Ref. Range 02/03/2019 11:06 02/02/2020 10:18  M Protein SerPl Elph-Mcnc Latest Ref Range: Not Observed g/dL 1.0 (H) 1.0 (H)  IFE 1 Unknown Comment (A) Comment (A)  Globulin, Total Latest Ref Range: 2.2 - 3.9 g/dL 3.0 2.9  B-Globulin SerPl Elph-Mcnc Latest Ref Range: 0.7 - 1.3 g/dL 0.8 0.8  IgG (Immunoglobin G), Serum Latest Ref Range: 603 - 1,613 mg/dL 1,646 (H) 1,467  IgM (Immunoglobulin M), Srm Latest Ref Range: 15 - 143 mg/dL 15 15   IgA Latest Ref Range: 61 - 437 mg/dL 32 (L) 36 (L)      Impression and Plan:  74 year old man with  1.  IgG kappa MGUS diagnosed in 2008.  He has been on active surveillance without any evidence of progression to symptomatic myeloma.    Laboratory data obtained on February 02, 2020 were personally reviewed and discussed today.  His protein studies continues to show stable M spike of around 1.0 g/dL which has not changed dramatically.  His IgG level remains within normal range.  The  natural course of this disease and risk of progression into symptomatic multiple myeloma were reviewed.  Given the chronicity of these findings and stable disease over 10 years the risk remains around 1 % per year.  Treatment indication does symptomatic myeloma symptoms were discussed today.  These would include worsening renal failure, bone lesions and hypercalcemia.    For the time being I recommended continued active surveillance on an annual basis.  2.  Renal insufficiency: Unrelated to plasma cell disorder with baseline kidney function unchanged.  3.  Covid vaccination considerations: He is up-to-date completed his booster vaccination.  4.  Follow-up: In 1 year for repeat follow-up.  30  Minutes were dedicated to this encounter.  The time was spent on reviewing his disease status, reviewing laboratory data and signs and symptoms of disease progression reviewed for the future.     Zola Button, MD 11/19/20211:06 PM

## 2020-02-12 ENCOUNTER — Telehealth: Payer: Self-pay | Admitting: Oncology

## 2020-02-12 DIAGNOSIS — I129 Hypertensive chronic kidney disease with stage 1 through stage 4 chronic kidney disease, or unspecified chronic kidney disease: Secondary | ICD-10-CM | POA: Diagnosis not present

## 2020-02-12 NOTE — Telephone Encounter (Signed)
Scheduled per 11/19 los. Called pt and left a msg  °

## 2020-02-28 ENCOUNTER — Ambulatory Visit (INDEPENDENT_AMBULATORY_CARE_PROVIDER_SITE_OTHER): Payer: Medicare Other

## 2020-02-28 DIAGNOSIS — I255 Ischemic cardiomyopathy: Secondary | ICD-10-CM | POA: Diagnosis not present

## 2020-02-28 DIAGNOSIS — I4821 Permanent atrial fibrillation: Secondary | ICD-10-CM

## 2020-03-01 LAB — CUP PACEART REMOTE DEVICE CHECK
Battery Remaining Longevity: 66 mo
Battery Remaining Percentage: 63 %
Battery Voltage: 2.96 V
Brady Statistic RV Percent Paced: 3.1 %
Date Time Interrogation Session: 20211208123303
HighPow Impedance: 51 Ohm
HighPow Impedance: 52 Ohm
Implantable Lead Implant Date: 20080527
Implantable Lead Location: 753860
Implantable Lead Model: 7121
Implantable Pulse Generator Implant Date: 20171201
Lead Channel Impedance Value: 490 Ohm
Lead Channel Pacing Threshold Amplitude: 1.25 V
Lead Channel Pacing Threshold Pulse Width: 0.5 ms
Lead Channel Sensing Intrinsic Amplitude: 12 mV
Lead Channel Setting Pacing Amplitude: 2.5 V
Lead Channel Setting Pacing Pulse Width: 0.5 ms
Lead Channel Setting Sensing Sensitivity: 0.5 mV
Pulse Gen Serial Number: 7283394

## 2020-03-12 NOTE — Progress Notes (Signed)
Remote ICD transmission.   

## 2020-04-04 ENCOUNTER — Telehealth: Payer: Self-pay

## 2020-04-04 NOTE — Telephone Encounter (Signed)
Three NSVT arrhythmias detected.  One episode fell into the VT monitor only zone ~ 176 bpm,  (44 seconds in duration). No treated VT/VF arrhythmias detected. [facility] favors fluid retention during logged events. Patient states he was unaware of elevated rates. States he has felt fine and denies any complaints. Denies any fluid retention symptoms. Reports compliance with all medications including Lopressor 12.5 mg BID, xarelto 20 mg daily. Patient reminded he has OV with Dr. Lovena Le 06/11/20. Advised patient we will call with any changes in plan. Advised to call with further questions or concerns. Verbalized understanding.

## 2020-04-09 NOTE — Telephone Encounter (Signed)
Arrhythmia noted.

## 2020-04-16 ENCOUNTER — Telehealth: Payer: Self-pay | Admitting: Internal Medicine

## 2020-04-16 NOTE — Telephone Encounter (Signed)
Disability placecard paperwork in box . EM

## 2020-04-17 NOTE — Telephone Encounter (Signed)
Done, placed in MYD box 

## 2020-04-19 NOTE — Telephone Encounter (Signed)
Placed in the front office and pt is aware

## 2020-05-29 ENCOUNTER — Ambulatory Visit (INDEPENDENT_AMBULATORY_CARE_PROVIDER_SITE_OTHER): Payer: Medicare Other

## 2020-05-29 DIAGNOSIS — I255 Ischemic cardiomyopathy: Secondary | ICD-10-CM

## 2020-05-31 LAB — CUP PACEART REMOTE DEVICE CHECK
Battery Remaining Longevity: 65 mo
Battery Remaining Percentage: 62 %
Battery Voltage: 2.95 V
Brady Statistic RV Percent Paced: 2.4 %
Date Time Interrogation Session: 20220309085512
HighPow Impedance: 54 Ohm
HighPow Impedance: 54 Ohm
Implantable Lead Implant Date: 20080527
Implantable Lead Location: 753860
Implantable Lead Model: 7121
Implantable Pulse Generator Implant Date: 20171201
Lead Channel Impedance Value: 530 Ohm
Lead Channel Pacing Threshold Amplitude: 1.25 V
Lead Channel Pacing Threshold Pulse Width: 0.5 ms
Lead Channel Sensing Intrinsic Amplitude: 11.1 mV
Lead Channel Setting Pacing Amplitude: 2.5 V
Lead Channel Setting Pacing Pulse Width: 0.5 ms
Lead Channel Setting Sensing Sensitivity: 0.5 mV
Pulse Gen Serial Number: 7283394

## 2020-06-06 NOTE — Progress Notes (Signed)
Remote ICD transmission.   

## 2020-06-07 ENCOUNTER — Telehealth: Payer: Self-pay

## 2020-06-07 NOTE — Telephone Encounter (Signed)
Called pt to give our condolences to pt for recent loss of a person who is also our patient.

## 2020-06-10 DIAGNOSIS — E1122 Type 2 diabetes mellitus with diabetic chronic kidney disease: Secondary | ICD-10-CM | POA: Diagnosis not present

## 2020-06-10 DIAGNOSIS — D472 Monoclonal gammopathy: Secondary | ICD-10-CM | POA: Diagnosis not present

## 2020-06-10 DIAGNOSIS — N1831 Chronic kidney disease, stage 3a: Secondary | ICD-10-CM | POA: Diagnosis not present

## 2020-06-10 DIAGNOSIS — E1129 Type 2 diabetes mellitus with other diabetic kidney complication: Secondary | ICD-10-CM | POA: Diagnosis not present

## 2020-06-10 DIAGNOSIS — I129 Hypertensive chronic kidney disease with stage 1 through stage 4 chronic kidney disease, or unspecified chronic kidney disease: Secondary | ICD-10-CM | POA: Diagnosis not present

## 2020-06-10 DIAGNOSIS — I480 Paroxysmal atrial fibrillation: Secondary | ICD-10-CM | POA: Diagnosis not present

## 2020-06-11 ENCOUNTER — Ambulatory Visit: Payer: Medicare Other | Admitting: Internal Medicine

## 2020-06-11 ENCOUNTER — Other Ambulatory Visit: Payer: Self-pay

## 2020-06-11 ENCOUNTER — Encounter: Payer: Self-pay | Admitting: Internal Medicine

## 2020-06-11 VITALS — BP 124/70 | HR 64 | Ht 69.0 in | Wt 190.8 lb

## 2020-06-11 DIAGNOSIS — Z95810 Presence of automatic (implantable) cardiac defibrillator: Secondary | ICD-10-CM

## 2020-06-11 DIAGNOSIS — I10 Essential (primary) hypertension: Secondary | ICD-10-CM | POA: Diagnosis not present

## 2020-06-11 DIAGNOSIS — I498 Other specified cardiac arrhythmias: Secondary | ICD-10-CM | POA: Diagnosis not present

## 2020-06-11 LAB — CUP PACEART INCLINIC DEVICE CHECK
Battery Remaining Longevity: 64 mo
Brady Statistic RV Percent Paced: 2.3 %
Date Time Interrogation Session: 20220322165718
HighPow Impedance: 51.6719
Implantable Lead Implant Date: 20080527
Implantable Lead Location: 753860
Implantable Lead Model: 7121
Implantable Pulse Generator Implant Date: 20171201
Lead Channel Impedance Value: 487.5 Ohm
Lead Channel Pacing Threshold Amplitude: 1 V
Lead Channel Pacing Threshold Pulse Width: 0.5 ms
Lead Channel Sensing Intrinsic Amplitude: 12 mV
Lead Channel Setting Pacing Amplitude: 2.5 V
Lead Channel Setting Pacing Pulse Width: 0.5 ms
Lead Channel Setting Sensing Sensitivity: 0.5 mV
Pulse Gen Serial Number: 7283394

## 2020-06-11 NOTE — Patient Instructions (Signed)
Medication Instructions:  Your physician recommends that you continue on your current medications as directed. Please refer to the Current Medication list given to you today.  Labwork: None ordered.  Testing/Procedures: None ordered.  Follow-Up: Your physician wants you to follow-up in: one year with Cristopher Peru, MD or one of the following Advanced Practice Providers on your designated Care Team:    Chanetta Marshall, NP  Tommye Standard, PA-C  Legrand Como "Jonni Sanger" [place], [place]  Remote monitoring is used to monitor your ICD from home. This monitoring reduces the number of office visits required to check your device to one time per year. It allows [place] to keep an eye on the functioning of your device to ensure it is working properly. You are scheduled for a device check from home on 08/28/2020. You may send your transmission at any time that day. If you have a wireless device, the transmission will be sent automatically. After your physician reviews your transmission, you will receive a postcard with your next transmission date.  Any Other Special Instructions Will Be Listed Below (If Applicable).  If you need a refill on your cardiac medications before your next appointment, please call your pharmacy.

## 2020-06-11 NOTE — Progress Notes (Signed)
HPI Mr. Tuazon returns today for ongoing followup and preoperative evaluation. He is a pleasant 75 yo man with Brugada syndrome,s/p ICD insertion. He denies chest pain or sob. He remains active. No other complaints.No ICD therapies. He does not feel his SVT. Allergies  Allergen Reactions  . Lipitor [Atorvastatin] Rash     Current Outpatient Medications  Medication Sig Dispense Refill  . acetaminophen (TYLENOL) 325 MG tablet Take 650 mg by mouth 4 (four) times daily as needed.    . baclofen (LIORESAL) 10 MG tablet Take 1 tablet (10 mg total) by mouth 2 (two) times daily as needed for muscle spasms. 20 each 0  . bisacodyl (DULCOLAX) 10 MG suppository Place 1 suppository (10 mg total) rectally daily as needed for moderate constipation. 12 suppository 0  . budesonide-formoterol (SYMBICORT) 160-4.5 MCG/ACT inhaler Inhale 2 puffs into the lungs 2 (two) times daily. 10.2 g 1  . ciclopirox (LOPROX) 0.77 % cream Apply 1 application topically 2 (two) times daily as needed (for rash).     . Clotrimazole 1 % OINT Apply 1 application topically daily. 30 g 0  . glipiZIDE (GLUCOTROL) 10 MG tablet Take 1 tablet by mouth in the morning and at bedtime.    Marland Kitchen HYDROcodone-acetaminophen (NORCO) 5-325 MG tablet Take 1-2 tablets by mouth every 6 (six) hours as needed for severe pain (Take tylenol for mild and moderate pain). 30 tablet 0  . lactulose (CHRONULAC) 10 GM/15ML solution Take 20 g by mouth 2 (two) times daily as needed for mild constipation.    . levETIRAcetam (KEPPRA) 500 MG tablet Take 500-1,000 mg by mouth See admin instructions. Take one tablet by mouth in the morning and two tablets by mouth at night    . losartan (COZAAR) 100 MG tablet Take 50 mg by mouth daily.    . metoprolol tartrate (LOPRESSOR) 25 MG tablet Take 12.5 mg by mouth 2 (two) times daily.    . Multiple Vitamin (MULTIVITAMIN) tablet Take 1 tablet by mouth daily.    . Niacin, Antihyperlipidemic, 500 MG TABS Take 500 mg by  mouth daily. 30 tablet 2  . NIFEdipine (PROCARDIA-XL/ADALAT CC) 30 MG 24 hr tablet Take 30 mg by mouth daily. "PRN"    . Omega-3 Fatty Acids (FISH OIL) 1200 MG CAPS Take 1,200 mg by mouth daily.     . ondansetron (ZOFRAN) 4 MG tablet Take 1 tablet (4 mg total) by mouth every 6 (six) hours as needed for nausea. 20 tablet 0  . polyethylene glycol (MIRALAX / GLYCOLAX) 17 g packet Take 17 g by mouth 2 (two) times daily. 14 each 0  . rivaroxaban (XARELTO) 20 MG TABS tablet Take 20 mg by mouth daily.     Marland Kitchen senna (SENOKOT) 8.6 MG TABS tablet Take 1 tablet (8.6 mg total) by mouth 2 (two) times daily. 120 tablet 0  . simvastatin (ZOCOR) 20 MG tablet Take 10 mg by mouth at bedtime.     . tamsulosin (FLOMAX) 0.4 MG CAPS capsule Take 0.4 mg by mouth daily.  5   No current facility-administered medications for this visit.     Past Medical History:  Diagnosis Date  . Acute myocardial infarction ([facility]) 06/1983  . Arthritis   . Atrial fibrillation ([place])   . Automatic implantable cardiac defibrillator in situ   . Borderline diabetes mellitus   . BPH without obstruction/lower urinary tract symptoms   . CAD (coronary artery disease)   . Chronic kidney disease, stage 1   .  Colonic polyp 2004   hyperplastic   . COPD (chronic obstructive pulmonary disease) ([place])   . Diabetes mellitus without complication ([place])   . Fall   . GERD (gastroesophageal reflux disease)   . History of renal insufficiency syndrome   . HTN (hypertension)   . Hyperlipidemia   . Knee derangement 09/2018   LEFT KNEE  . Monoclonal gammopathy   . 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 Seizure disorder ([place])   . Stroke [facility])    pt reports he has had 3    ROS:   All systems reviewed and negative except as noted in the HPI.   Past Surgical History:  Procedure Laterality Date  . CARDIAC CATHETERIZATION  1987   Showed distal left circumflex 100% occluded   . CARDIAC DEFIBRILLATOR PLACEMENT  08/17/2006    Implantation of a St. Jude single chamber defibrillator  . COLONOSCOPY  07/26/2019  . EP IMPLANTABLE DEVICE N/A 02/21/2016   Procedure: ICD Generator Changeout;  Surgeon: Evans Lance, MD;  Location: [place] [facility];  Service: Cardiovascular;  Laterality: N/A;  . INGUINAL HERNIA REPAIR Left   . QUADRICEPS TENDON REPAIR Left 10/20/2018   Procedure: REPAIR QUADRICEP TENDON;  Surgeon: Renette Butters, MD;  Location: [place];  Service: Orthopedics;  Laterality: Left;     Family History  Problem Relation Age of Onset  . Colon cancer Brother   . Heart attack Mother   . Heart attack Brother   . Seizures Neg Hx      Social History   Socioeconomic History  . Marital status: Single    Spouse name: Not on file  . Number of children: 1  . Years of education: Not on file  . Highest education level: Not on file  Occupational History  . Occupation: retired    Fish farm manager: RETIRED  Tobacco Use  . Smoking status: Former Smoker    Packs/day: 0.50    Years: 15.00    Pack years: 7.50    Types: Cigarettes    Quit date: 03/23/2004    Years since quitting: 16.2  . Smokeless tobacco: Never Used  Vaping Use  . Vaping Use: Never used  Substance and Sexual Activity  . Alcohol use: Yes    Alcohol/week: 1.0 standard drink    Types: 1 Glasses of wine per week    Comment: rare--wine  . Drug use: No  . Sexual activity: Not Currently  Other Topics Concern  . Not on file  Social History Narrative   ICD-St. Jude  Remote-Yes      Live currently @ [facility] for rehabilitation. Plans to discharge to his daughter's house for continuation of rehab.   Social Determinants of Health   Financial Resource Strain: Not on file  Food Insecurity: Not on file  Transportation Needs: Not on file  Physical Activity: Not on file  Stress: Not on file  Social Connections: Not on file  Intimate Partner Violence: Not on file     BP 124/70   Pulse 64   Ht 5\' 9"  (1.753 m)   Wt 190 lb 12.8 oz (86.5 kg)    SpO2 97%   BMI 28.18 kg/m   Physical Exam:  Well appearing NAD HEENT: Unremarkable Neck:  No JVD, no thyromegally Lymphatics:  No adenopathy Back:  No CVA tenderness Lungs:  Clear HEART:  Regular rate rhythm, no murmurs, no rubs, no clicks Abd:  soft, positive bowel sounds, no organomegally, no rebound, no guarding Ext:  2 plus pulses, no edema, no cyanosis, no clubbing Skin:  No rashes no nodules Neuro:  CN II through XII intact, motor grossly intact  EKG  DEVICE  Normal device function.  See PaceArt for details.   Assess/Plan: 1. Brugada syndrome - He has had no recurrent ventricular arrhythmias. He will continue his current meds. No indication for quinidine. 2. ICD - His St. Jude single chamber ICD is working normally. 3. CAD - he denies anginal symptoms. He remains active. 4. SVT - he is asymptomatic. He has had spells of SVT at 176/min. He will undergo watchful waiting.  Carleene Overlie Taylor,MD

## 2020-08-28 ENCOUNTER — Ambulatory Visit (INDEPENDENT_AMBULATORY_CARE_PROVIDER_SITE_OTHER): Payer: Medicare Other

## 2020-08-28 DIAGNOSIS — I255 Ischemic cardiomyopathy: Secondary | ICD-10-CM | POA: Diagnosis not present

## 2020-08-28 LAB — CUP PACEART REMOTE DEVICE CHECK
Battery Remaining Longevity: 62 mo
Battery Remaining Percentage: 60 %
Battery Voltage: 2.95 V
Brady Statistic RV Percent Paced: 1 %
Date Time Interrogation Session: 20220608015034
HighPow Impedance: 54 Ohm
HighPow Impedance: 55 Ohm
Implantable Lead Implant Date: 20080527
Implantable Lead Location: 753860
Implantable Lead Model: 7121
Implantable Pulse Generator Implant Date: 20171201
Lead Channel Impedance Value: 530 Ohm
Lead Channel Pacing Threshold Amplitude: 1 V
Lead Channel Pacing Threshold Pulse Width: 0.5 ms
Lead Channel Sensing Intrinsic Amplitude: 12 mV
Lead Channel Setting Pacing Amplitude: 2.5 V
Lead Channel Setting Pacing Pulse Width: 0.5 ms
Lead Channel Setting Sensing Sensitivity: 0.5 mV
Pulse Gen Serial Number: 7283394

## 2020-09-20 NOTE — Progress Notes (Signed)
Remote ICD transmission.   

## 2020-11-12 DIAGNOSIS — I480 Paroxysmal atrial fibrillation: Secondary | ICD-10-CM | POA: Diagnosis not present

## 2020-11-12 DIAGNOSIS — N1831 Chronic kidney disease, stage 3a: Secondary | ICD-10-CM | POA: Diagnosis not present

## 2020-11-12 DIAGNOSIS — E1122 Type 2 diabetes mellitus with diabetic chronic kidney disease: Secondary | ICD-10-CM | POA: Diagnosis not present

## 2020-11-12 DIAGNOSIS — I129 Hypertensive chronic kidney disease with stage 1 through stage 4 chronic kidney disease, or unspecified chronic kidney disease: Secondary | ICD-10-CM | POA: Diagnosis not present

## 2020-11-12 DIAGNOSIS — N2581 Secondary hyperparathyroidism of renal origin: Secondary | ICD-10-CM | POA: Diagnosis not present

## 2020-11-12 DIAGNOSIS — D472 Monoclonal gammopathy: Secondary | ICD-10-CM | POA: Diagnosis not present

## 2020-11-12 DIAGNOSIS — E1129 Type 2 diabetes mellitus with other diabetic kidney complication: Secondary | ICD-10-CM | POA: Diagnosis not present

## 2020-11-20 LAB — HM DIABETES EYE EXAM

## 2020-11-21 ENCOUNTER — Encounter: Payer: Self-pay | Admitting: Nurse Practitioner

## 2020-11-21 ENCOUNTER — Ambulatory Visit (INDEPENDENT_AMBULATORY_CARE_PROVIDER_SITE_OTHER): Payer: Medicare Other | Admitting: Nurse Practitioner

## 2020-11-21 ENCOUNTER — Other Ambulatory Visit: Payer: Self-pay

## 2020-11-21 VITALS — BP 132/78 | HR 64 | Temp 96.5°F | Resp 16 | Ht 69.0 in | Wt 193.8 lb

## 2020-11-21 DIAGNOSIS — E1122 Type 2 diabetes mellitus with diabetic chronic kidney disease: Secondary | ICD-10-CM

## 2020-11-21 DIAGNOSIS — Z125 Encounter for screening for malignant neoplasm of prostate: Secondary | ICD-10-CM

## 2020-11-21 DIAGNOSIS — J411 Mucopurulent chronic bronchitis: Secondary | ICD-10-CM

## 2020-11-21 DIAGNOSIS — E782 Mixed hyperlipidemia: Secondary | ICD-10-CM | POA: Diagnosis not present

## 2020-11-21 DIAGNOSIS — E559 Vitamin D deficiency, unspecified: Secondary | ICD-10-CM | POA: Diagnosis not present

## 2020-11-21 DIAGNOSIS — R569 Unspecified convulsions: Secondary | ICD-10-CM | POA: Diagnosis not present

## 2020-11-21 DIAGNOSIS — D472 Monoclonal gammopathy: Secondary | ICD-10-CM | POA: Diagnosis not present

## 2020-11-21 DIAGNOSIS — R350 Frequency of micturition: Secondary | ICD-10-CM | POA: Diagnosis not present

## 2020-11-21 DIAGNOSIS — I4821 Permanent atrial fibrillation: Secondary | ICD-10-CM | POA: Diagnosis not present

## 2020-11-21 DIAGNOSIS — Z23 Encounter for immunization: Secondary | ICD-10-CM | POA: Diagnosis not present

## 2020-11-21 DIAGNOSIS — N401 Enlarged prostate with lower urinary tract symptoms: Secondary | ICD-10-CM

## 2020-11-21 DIAGNOSIS — N181 Chronic kidney disease, stage 1: Secondary | ICD-10-CM | POA: Diagnosis not present

## 2020-11-21 DIAGNOSIS — I10 Essential (primary) hypertension: Secondary | ICD-10-CM

## 2020-11-21 LAB — LIPID PANEL
Cholesterol: 140 mg/dL (ref 0–200)
HDL: 40.5 mg/dL (ref >39.00)
NonHDL: 99.97
Total CHOL/HDL Ratio: 3
Triglycerides: 392 mg/dL — ABNORMAL HIGH (ref 0.0–149.0)
VLDL: 78.4 mg/dL — ABNORMAL HIGH (ref 0.0–40.0)

## 2020-11-21 LAB — MICROALBUMIN / CREATININE URINE RATIO
Creatinine,U: 69.9 mg/dL
Microalb Creat Ratio: 5.6 mg/g (ref 0.0–30.0)
Microalb, Ur: 3.9 mg/dL — ABNORMAL HIGH (ref 0.0–1.9)

## 2020-11-21 LAB — COMPREHENSIVE METABOLIC PANEL
ALT: 26 U/L (ref 0–53)
AST: 18 U/L (ref 0–37)
Albumin: 3.9 g/dL (ref 3.5–5.2)
Alkaline Phosphatase: 72 U/L (ref 39–117)
BUN: 14 mg/dL (ref 6–23)
CO2: 27 mEq/L (ref 19–32)
Calcium: 9.2 mg/dL (ref 8.4–10.5)
Chloride: 96 mEq/L (ref 96–112)
Creatinine, Ser: 1.29 mg/dL (ref 0.40–1.50)
GFR: 55.25 mL/min — ABNORMAL LOW (ref >60.00)
Glucose, Bld: 380 mg/dL — ABNORMAL HIGH (ref 70–99)
Potassium: 4.4 mEq/L (ref 3.5–5.1)
Sodium: 128 mEq/L — ABNORMAL LOW (ref 135–145)
Total Bilirubin: 1 mg/dL (ref 0.2–1.2)
Total Protein: 6.7 g/dL (ref 6.0–8.3)

## 2020-11-21 LAB — CBC
HCT: 38.5 % — ABNORMAL LOW (ref 39.0–52.0)
Hemoglobin: 13 g/dL (ref 13.0–17.0)
MCHC: 33.8 g/dL (ref 30.0–36.0)
MCV: 91.6 fl (ref 78.0–100.0)
Platelets: 169 10*3/uL (ref 150.0–400.0)
RBC: 4.2 Mil/uL — ABNORMAL LOW (ref 4.22–5.81)
RDW: 13.1 % (ref 11.5–15.5)
WBC: 4.2 10*3/uL (ref 4.0–10.5)

## 2020-11-21 LAB — HEMOGLOBIN A1C: Hgb A1c MFr Bld: 12.4 % — ABNORMAL HIGH (ref 4.6–6.5)

## 2020-11-21 LAB — VITAMIN D 25 HYDROXY (VIT D DEFICIENCY, FRACTURES): VITD: 31.36 ng/mL (ref 30.00–100.00)

## 2020-11-21 LAB — PSA, MEDICARE: PSA: 0.65 ng/ml (ref 0.10–4.00)

## 2020-11-21 LAB — LDL CHOLESTEROL, DIRECT: Direct LDL: 28 mg/dL

## 2020-11-21 MED ORDER — BUDESONIDE-FORMOTEROL FUMARATE 160-4.5 MCG/ACT IN AERO: 2.0000 | INHALATION_SPRAY | Freq: Two times a day (BID) | RESPIRATORY_TRACT | 1 refills | Status: DC

## 2020-11-21 MED ORDER — ALBUTEROL SULFATE HFA 108 (90 BASE) MCG/ACT IN AERS
2.0000 | INHALATION_SPRAY | Freq: Four times a day (QID) | RESPIRATORY_TRACT | 0 refills | Status: DC | PRN
Start: 2020-11-21 — End: 2021-07-28

## 2020-11-21 NOTE — Assessment & Plan Note (Signed)
On glipized and metformin. Does not check sugar levels at home. Continue glipizide and metformin. Will check labs in office today. Pending results

## 2020-11-21 NOTE — Assessment & Plan Note (Signed)
Continue following with [facility]. Continue taking Levetiracetam as prescribed

## 2020-11-21 NOTE — Progress Notes (Signed)
Established Patient Office Visit  Subjective:  Patient ID: Brad Brown, male    DOB: December 13, 1945  Age: 75 y.o. MRN: [id number]  CC:  Chief Complaint  Patient presents with   Transfer of Care    HPI Brad Brown presents for   HTN: Does check his blood pressure at home once a month. Walks dog daily 20 mins daily. On Losartan and metoprolol.  COPD: On symbicort  been out recently. 20 years at about 0.5ppd. Does not have albuterol. Intermittent DOE when he over exerts himself.  DM: Does not check sugar at home. States that he has one episode of hypoglycemia approx 15 years ago. Managed on metformin and glipizide.  HLD: managed on simvastatin and niacin.  MGUS: Sees heme/onc once a year. Dr. Rodena Piety.  Afib: managed by Dr. Cristopher Peru. Brad Brown  Siezures: managed by neurology ([facility]). Currently maintained on levetiracetam.  Nocturia. States that he goes to the bathroom every 2 hours. No other symptoms. Unsure if he is still on the flomax. He is to check when he gets home. Past Medical History:  Diagnosis Date   Acute myocardial infarction [facility]) 06/1983   Arthritis    Atrial fibrillation ([facility])    Automatic implantable cardiac defibrillator in situ    Borderline diabetes mellitus    BPH without obstruction/lower urinary tract symptoms    CAD (coronary artery disease)    Chronic kidney disease, stage 1    Colonic polyp 2004   hyperplastic    COPD (chronic obstructive pulmonary disease) ([facility])    Diabetes mellitus without complication ([place])    Fall    GERD (gastroesophageal reflux disease)    History of renal insufficiency syndrome    HTN (hypertension)    Hyperlipidemia    Knee derangement 09/2018   LEFT KNEE   Monoclonal gammopathy    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 Jude single chamber defibrillator   COLONOSCOPY  07/26/2019   EP IMPLANTABLE DEVICE N/A 02/21/2016   Procedure: ICD Generator Changeout;  Surgeon: Evans Lance, MD;  Location: [place] [facility];  Service: Cardiovascular;  Laterality: N/A;   INGUINAL HERNIA REPAIR Left    QUADRICEPS TENDON REPAIR Left 10/20/2018   Procedure: REPAIR QUADRICEP TENDON;  Surgeon: Renette Butters, MD;  Location: [place];  Service: Orthopedics;  Laterality: Left;    Family History  Problem Relation Age of Onset   Colon cancer Brother    Heart attack Mother    Heart attack Brother    Seizures Neg Hx     Social History   Socioeconomic History   Marital status: Single    Spouse name: Not on file   Number of children: 1   Years of education: Not on file   Highest education level: Not on file  Occupational History   Occupation: retired    Fish farm manager: RETIRED  Tobacco Use   Smoking status: Former    Packs/day: 0.50    Years: 15.00    Pack years: 7.50    Types: Cigarettes    Quit date: 03/23/2004    Years since quitting: 16.6   Smokeless tobacco: Never  Vaping Use  Vaping Use: Never used  Substance and Sexual Activity   Alcohol use: Yes    Alcohol/week: 1.0 standard drink    Types: 1 Glasses of wine per week    Comment: rare--wine   Drug use: No   Sexual activity: Not Currently  Other Topics Concern   Not on file  Social History Narrative   ICD-St. Jude  Remote-Yes      Live currently @ [facility] for rehabilitation. Plans to discharge to his daughter's house for continuation of rehab.   Social Determinants of Health   Financial Resource Strain: Not on file  Food Insecurity: Not on file  Transportation Needs: Not on file  Physical Activity: Not on file  Stress: Not on file  Social Connections: Not on file  Intimate Partner Violence: Not on file    Outpatient Medications Prior to  Visit  Medication Sig Dispense Refill   acetaminophen (TYLENOL) 325 MG tablet Take 650 mg by mouth 4 (four) times daily as needed.     baclofen (LIORESAL) 10 MG tablet Take 1 tablet (10 mg total) by mouth 2 (two) times daily as needed for muscle spasms. 20 each 0   bisacodyl (DULCOLAX) 10 MG suppository Place 1 suppository (10 mg total) rectally daily as needed for moderate constipation. 12 suppository 0   Cholecalciferol 50 MCG (2000 UT) TABS Take 1 tablet by mouth daily.     ciclopirox (LOPROX) 0.77 % cream Apply 1 application topically 2 (two) times daily as needed (for rash).      Clotrimazole 1 % OINT Apply 1 application topically daily. 30 g 0   glipiZIDE (GLUCOTROL) 10 MG tablet Take 1 tablet by mouth in the morning and at bedtime.     HYDROcodone-acetaminophen (NORCO) 5-325 MG tablet Take 1-2 tablets by mouth every 6 (six) hours as needed for severe pain (Take tylenol for mild and moderate pain). 30 tablet 0   lactulose (CHRONULAC) 10 GM/15ML solution Take 20 g by mouth 2 (two) times daily as needed for mild constipation.     levETIRAcetam (KEPPRA) 500 MG tablet Take 500-1,000 mg by mouth See admin instructions. Take one tablet by mouth in the morning and two tablets by mouth at night     levETIRAcetam (KEPPRA) 500 MG tablet 1 in the morning and 2 in the afternoon     losartan (COZAAR) 100 MG tablet Take 50 mg by mouth daily.     metFORMIN (GLUCOPHAGE-XR) 500 MG 24 hr tablet Take 1 tablet by mouth 2 (two) times daily.     metoprolol tartrate (LOPRESSOR) 25 MG tablet Take 12.5 mg by mouth 2 (two) times daily.     Multiple Vitamin (MULTIVITAMIN) tablet Take 1 tablet by mouth daily.     Niacin, Antihyperlipidemic, 500 MG TABS Take 500 mg by mouth daily. 30 tablet 2   nystatin-triamcinolone (MYCOLOG II) cream APPLY SMALL AMOUNT TO AFFECTED AREA TWICE A DAY     Omega-3 Fatty Acids (FISH OIL) 1200 MG CAPS Take 1,200 mg by mouth daily.      ondansetron (ZOFRAN) 4 MG tablet Take 1 tablet (4 mg  total) by mouth every 6 (six) hours as needed for nausea. 20 tablet 0   polyethylene glycol (MIRALAX / GLYCOLAX) 17 g packet Take 17 g by mouth 2 (two) times daily. 14 each 0   rivaroxaban (XARELTO) 20 MG TABS tablet Take 20 mg by mouth daily.      senna (SENOKOT) 8.6 MG TABS tablet Take 1 tablet (8.6 mg total) by  mouth 2 (two) times daily. 120 tablet 0   simvastatin (ZOCOR) 20 MG tablet Take 10 mg by mouth at bedtime.      tamsulosin (FLOMAX) 0.4 MG CAPS capsule Take 0.4 mg by mouth daily.  5   budesonide-formoterol (SYMBICORT) 160-4.5 MCG/ACT inhaler Inhale 2 puffs into the lungs 2 (two) times daily. 10.2 g 1   simvastatin (ZOCOR) 20 MG tablet Take 1 tablet by mouth at bedtime.     NIFEdipine (PROCARDIA-XL/ADALAT CC) 30 MG 24 hr tablet Take 30 mg by mouth daily. "PRN"     No facility-administered medications prior to visit.    Allergies  Allergen Reactions   Ace Inhibitors    Lipitor [Atorvastatin] Rash    ROS Review of Systems  Constitutional:  Negative for chills and fever.  Respiratory:  Negative for cough, shortness of breath and wheezing.   Cardiovascular:  Negative for chest pain.  Gastrointestinal:  Negative for abdominal pain, blood in stool, constipation, diarrhea, nausea and vomiting.  Genitourinary:  Positive for frequency. Negative for dysuria.       Nocturia every 2 hours  Skin:  Negative for color change and pallor.  Neurological:  Negative for weakness.  Psychiatric/Behavioral:  Negative for hallucinations, self-injury and suicidal ideas.      Objective:    Physical Exam Vitals and nursing note reviewed.  Constitutional:      Appearance: Normal appearance.  HENT:     Right Ear: Tympanic membrane, ear canal and external ear normal. There is no impacted cerumen.     Left Ear: Tympanic membrane, ear canal and external ear normal. There is no impacted cerumen.  Eyes:     Extraocular Movements: Extraocular movements intact.     Pupils: Pupils are equal, round,  and reactive to light.  Neck:     Vascular: No carotid bruit.  Cardiovascular:     Rate and Rhythm: Normal rate and regular rhythm.     Heart sounds: No murmur heard. Pulmonary:     Effort: Pulmonary effort is normal.     Breath sounds: Normal breath sounds.  Abdominal:     General: Bowel sounds are normal.  Musculoskeletal:     Right lower leg: No edema.     Left lower leg: No edema.  Lymphadenopathy:     Cervical: No cervical adenopathy.  Neurological:     Mental Status: He is alert.     Motor: No weakness.     Gait: Gait normal.     Deep Tendon Reflexes: Reflexes normal.  Psychiatric:        Mood and Affect: Mood normal.        Behavior: Behavior normal.        Thought Content: Thought content normal.        Judgment: Judgment normal.    BP 132/78   Pulse 64   Temp (!) 96.5 F (35.8 C)   Resp 16   Ht 5' 9"  (1.753 m)   Wt 193 lb 12 oz (87.9 kg)   SpO2 99%   BMI 28.61 kg/m  Wt Readings from Last 3 Encounters:  11/21/20 193 lb 12 oz (87.9 kg)  06/11/20 190 lb 12.8 oz (86.5 kg)  02/09/20 195 lb 6.4 oz (88.6 kg)     Health Maintenance Due  Topic Date Due   OPHTHALMOLOGY EXAM  06/21/2016   COVID-19 Vaccine (3 - Pfizer risk series) 07/04/2019   HEMOGLOBIN A1C  11/30/2019   FOOT EXAM  05/29/2020    There are no  preventive care reminders to display for this patient.  Lab Results  Component Value Date   TSH 2.62 09/12/2019   Lab Results  Component Value Date   WBC 3.7 (L) 02/02/2020   HGB 12.9 (L) 02/02/2020   HCT 38.6 (L) 02/02/2020   MCV 92.6 02/02/2020   PLT 168 02/02/2020   Lab Results  Component Value Date   NA 137 02/02/2020   K 4.4 02/02/2020   CHLORIDE 106 01/30/2016   CO2 25 02/02/2020   GLUCOSE 163 (H) 02/02/2020   BUN 11 02/02/2020   CREATININE 1.39 (H) 02/02/2020   BILITOT 1.2 02/02/2020   ALKPHOS 56 02/02/2020   AST 21 02/02/2020   ALT 30 02/02/2020   PROT 7.4 02/02/2020   ALBUMIN 4.0 02/02/2020   CALCIUM 9.2 02/02/2020    ANIONGAP 6 02/02/2020   EGFR 61 (L) 01/30/2016   GFR 52.47 (L) 09/12/2019   Lab Results  Component Value Date   CHOL 134 05/30/2019   Lab Results  Component Value Date   HDL 42.00 05/30/2019   Lab Results  Component Value Date   LDLCALC 47 04/21/2018   Lab Results  Component Value Date   TRIG 252.0 (H) 05/30/2019   Lab Results  Component Value Date   CHOLHDL 3 05/30/2019   Lab Results  Component Value Date   HGBA1C 6.2 05/30/2019      Assessment & Plan:   Problem List Items Addressed This Visit       Cardiovascular and Mediastinum   Hypertension    Currently maintain on metoprolol and losartan. BP with goal in office. Continue checking BP at home. Continue lifestyle modifications. Continue medications as listed before      Atrial fibrillation [facility])    Seen and managed by Cardiology, Dr. Cristopher Peru. Continue following up with [facility] as recommended. Continue Xarelto as prescribed         Respiratory   COPD (chronic obstructive pulmonary disease) ([place])    Currently maintained on symbicort. Has been out of medication approx 2 months. No SHOB. Continue symbicort and albuterol as needed      Relevant Medications   budesonide-formoterol (SYMBICORT) 160-4.5 MCG/ACT inhaler   albuterol (VENTOLIN HFA) 108 (90 Base) MCG/ACT inhaler     Endocrine   Type 2 diabetes mellitus with stage 1 chronic kidney disease, without long-term current use of insulin ([place])    On glipized and metformin. Does not check sugar levels at home. Continue glipizide and metformin. Will check labs in office today. Pending results      Relevant Medications   metFORMIN (GLUCOPHAGE-XR) 500 MG 24 hr tablet   Other Relevant Orders   CBC   Comprehensive metabolic panel   Hemoglobin A1c   Microalbumin / creatinine urine ratio     Genitourinary   BPH (benign prostatic hyperplasia)    Has frequent urination. Not sure if he is on the Flomax or not. He will report back. PSA in office today         Other   HLD (hyperlipidemia)    On Zocor. Continue Zocor. Pending lab results      Relevant Orders   Lipid panel   Seizures ([place])    Continue following with [facility]. Continue taking Levetiracetam as prescribed      Relevant Medications   levETIRAcetam (KEPPRA) 500 MG tablet   MGUS (monoclonal gammopathy of unknown significance)    Follows with Heme/onc once yearly. Dr Rodena Piety. Continue follow ups as recommended  Vitamin D deficiency    On OTC vitamin D 2000IU. Pending lab results      Relevant Orders   VITAMIN D 25 Hydroxy (Vit-D Deficiency, Fractures)   Other Visit Diagnoses     Need for immunization against influenza    -  Primary   Relevant Orders   Flu Vaccine QUAD High Dose(Fluad) (Completed)   Screening for prostate cancer       Relevant Orders   PSA, Medicare       No orders of the defined types were placed in this encounter.   Follow-up: No follow-ups on file.    Romilda Garret, NP

## 2020-11-21 NOTE — Assessment & Plan Note (Signed)
Follows with Heme/onc once yearly. Dr Rodena Piety. Continue follow ups as recommended

## 2020-11-21 NOTE — Assessment & Plan Note (Signed)
Seen and managed by Cardiology, Dr. Cristopher Peru. Continue following up with [facility] as recommended. Continue Xarelto as prescribed

## 2020-11-21 NOTE — Assessment & Plan Note (Signed)
On OTC vitamin D 2000IU. Pending lab results

## 2020-11-21 NOTE — Patient Instructions (Signed)
Nice to see you today. Will be in touch with your labs Want to see you in 3 months for your wellness exam

## 2020-11-21 NOTE — Assessment & Plan Note (Signed)
Currently maintain on metoprolol and losartan. BP with goal in office. Continue checking BP at home. Continue lifestyle modifications. Continue medications as listed before

## 2020-11-21 NOTE — Assessment & Plan Note (Signed)
Currently maintained on symbicort. Has been out of medication approx 2 months. No SHOB. Continue symbicort and albuterol as needed

## 2020-11-21 NOTE — Assessment & Plan Note (Signed)
On Zocor. Continue Zocor. Pending lab results

## 2020-11-21 NOTE — Assessment & Plan Note (Signed)
Has frequent urination. Not sure if he is on the Flomax or not. He will report back. PSA in office today

## 2020-11-22 ENCOUNTER — Other Ambulatory Visit: Payer: Self-pay | Admitting: Nurse Practitioner

## 2020-11-22 DIAGNOSIS — N181 Chronic kidney disease, stage 1: Secondary | ICD-10-CM

## 2020-11-22 DIAGNOSIS — E1122 Type 2 diabetes mellitus with diabetic chronic kidney disease: Secondary | ICD-10-CM

## 2020-11-22 MED ORDER — METFORMIN HCL ER (MOD) 1000 MG PO TB24: 1000.0000 mg | ORAL_TABLET | Freq: Two times a day (BID) | ORAL | 1 refills | Status: DC

## 2020-11-22 MED ORDER — BLOOD GLUCOSE METER KIT: PACK | 12 refills | Status: AC

## 2020-11-22 MED ORDER — DAPAGLIFLOZIN PROPANEDIOL 10 MG PO TABS: 10.0000 mg | ORAL_TABLET | Freq: Every day | ORAL | 1 refills | Status: DC

## 2020-11-22 NOTE — Addendum Note (Signed)
Addended by: Kris Mouton on: 11/22/2020 04:39 PM   Modules accepted: Orders

## 2020-11-23 NOTE — Progress Notes (Signed)
Received fax from [place] stating insurance does not cover Metformin 1000 mg 24hr tablet (will cover 500 mg 24 hour tablet) but will cover regular 1000 mg tablets BID. Please re order if this is appropriate. Thank you

## 2020-11-26 ENCOUNTER — Other Ambulatory Visit: Payer: Self-pay | Admitting: Nurse Practitioner

## 2020-11-26 DIAGNOSIS — N181 Chronic kidney disease, stage 1: Secondary | ICD-10-CM

## 2020-11-26 DIAGNOSIS — E1122 Type 2 diabetes mellitus with diabetic chronic kidney disease: Secondary | ICD-10-CM

## 2020-11-26 MED ORDER — METFORMIN HCL 1000 MG PO TABS: 1000.0000 mg | ORAL_TABLET | Freq: Two times a day (BID) | ORAL | 3 refills | Status: DC

## 2020-11-26 NOTE — Progress Notes (Signed)
Patient advised and verbalized understanding 

## 2020-11-27 ENCOUNTER — Ambulatory Visit (INDEPENDENT_AMBULATORY_CARE_PROVIDER_SITE_OTHER): Payer: Medicare Other

## 2020-11-27 DIAGNOSIS — I255 Ischemic cardiomyopathy: Secondary | ICD-10-CM

## 2020-11-27 LAB — CUP PACEART REMOTE DEVICE CHECK
Battery Remaining Longevity: 61 mo
Battery Remaining Percentage: 58 %
Battery Voltage: 2.95 V
Brady Statistic RV Percent Paced: 1.7 %
Date Time Interrogation Session: 20220907084836
HighPow Impedance: 56 Ohm
HighPow Impedance: 56 Ohm
Implantable Lead Implant Date: 20080527
Implantable Lead Location: 753860
Implantable Lead Model: 7121
Implantable Pulse Generator Implant Date: 20171201
Lead Channel Impedance Value: 560 Ohm
Lead Channel Pacing Threshold Amplitude: 1 V
Lead Channel Pacing Threshold Pulse Width: 0.5 ms
Lead Channel Sensing Intrinsic Amplitude: 12 mV
Lead Channel Setting Pacing Amplitude: 2.5 V
Lead Channel Setting Pacing Pulse Width: 0.5 ms
Lead Channel Setting Sensing Sensitivity: 0.5 mV
Pulse Gen Serial Number: 7283394

## 2020-11-28 ENCOUNTER — Telehealth: Payer: Self-pay

## 2020-11-28 NOTE — Telephone Encounter (Signed)
Per Romilda Garret, NP "Can we let the patient know that his diabetic eye exam looked normal. We will repeat it in 12 months. "  Patient advised.

## 2020-12-05 NOTE — Progress Notes (Signed)
Remote ICD transmission.   

## 2020-12-12 ENCOUNTER — Ambulatory Visit (INDEPENDENT_AMBULATORY_CARE_PROVIDER_SITE_OTHER): Payer: Medicare Other | Admitting: Nurse Practitioner

## 2020-12-12 ENCOUNTER — Other Ambulatory Visit: Payer: Self-pay

## 2020-12-12 VITALS — BP 122/58 | HR 62 | Temp 97.2°F | Resp 12 | Ht 69.0 in | Wt 190.4 lb

## 2020-12-12 DIAGNOSIS — N181 Chronic kidney disease, stage 1: Secondary | ICD-10-CM | POA: Diagnosis not present

## 2020-12-12 DIAGNOSIS — E1122 Type 2 diabetes mellitus with diabetic chronic kidney disease: Secondary | ICD-10-CM | POA: Diagnosis not present

## 2020-12-12 LAB — GLUCOSE, POCT (MANUAL RESULT ENTRY): POC Glucose: 144 mg/dl — AB (ref 70–99)

## 2020-12-12 MED ORDER — FREESTYLE LIBRE 14 DAY READER DEVI
1.0000 | 11 refills | Status: AC
Start: 2020-12-12 — End: ?

## 2020-12-12 MED ORDER — FREESTYLE LIBRE 14 DAY SENSOR MISC
1.0000 | 11 refills | Status: AC
Start: 2020-12-12 — End: 2021-01-09

## 2020-12-12 NOTE — Assessment & Plan Note (Signed)
Patient here for recheck of diabetes.  Patient has been attempting to check his blood glucose but having some issues with meter.  Patient is requesting a continuous glucose monitor sent prescription to pharmacy pending insurance coverage.  Patient's glucose in office was 144 today.  Patient seems to be tolerating medication changes and additions well.  Did reiterate that I want patient to check fasting glucose the morning and postprandial before bed, patient acknowledged.  Has an appointment scheduled February 20, 2021 we will keep appointment and recheck A1c and labs at that time.  Patient is working on diet modifications currently reducing blatant sugary objects.  Did review signs and symptoms of hypoglycemia, patient has not experienced any episodes hypoglycemia per report.  States he has experienced in the past approximate years ago so he does no signs and symptoms.

## 2020-12-12 NOTE — Patient Instructions (Addendum)
Nice to see you today I want you to check you sugar first thing in the morning before you eat or drink anything. Then again in the evening around 2 hours after you have your last meal for the day. If you can write down the date, time and sugar reading and bring it to office next visit.  I will see you 02/20/2021 to recheck labs and do the physical

## 2020-12-12 NOTE — Progress Notes (Signed)
Established Patient Office Visit  Subjective:  Patient ID: Brad Brown, male    DOB: 03/16/1946  Age: 75 y.o. MRN: [id number]  CC:  Chief Complaint  Patient presents with   Diabetes    Follow up.    HPI Brad Brown presents for Diabetes  Checking sugars at home. States having some machine difficulties states that he gets errors on the meter sometimes. He did not bring in his meter for [place] to trouble shot for him. Lowest 113. Highest 300. CBG in office 144 He has been changing his diet. Eliminating all sweets. He is tolerating the medication changes well per patient report.  He denies any episodes of hypoglycemia.  Past Medical History:  Diagnosis Date   Acute myocardial infarction [facility]) 06/1983   Arthritis    Atrial fibrillation (HCC)    Automatic implantable cardiac defibrillator in situ    Borderline diabetes mellitus    BPH without obstruction/lower urinary tract symptoms    CAD (coronary artery disease)    Chronic kidney disease, stage 1    Colonic polyp 2004   hyperplastic    COPD (chronic obstructive pulmonary disease) (HCC)    Diabetes mellitus without complication ([place])    Fall    GERD (gastroesophageal reflux disease)    History of renal insufficiency syndrome    HTN (hypertension)    Hyperlipidemia    Knee derangement 09/2018   LEFT KNEE   Monoclonal gammopathy    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)    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 Jude single chamber defibrillator   COLONOSCOPY  07/26/2019   EP IMPLANTABLE DEVICE N/A 02/21/2016   Procedure: ICD Generator Changeout;  Surgeon: Evans Lance, MD;  Location: [place] [facility];  Service: Cardiovascular;  Laterality: N/A;   INGUINAL HERNIA  REPAIR Left    QUADRICEPS TENDON REPAIR Left 10/20/2018   Procedure: REPAIR QUADRICEP TENDON;  Surgeon: Renette Butters, MD;  Location: [place];  Service: Orthopedics;  Laterality: Left;    Family History  Problem Relation Age of Onset   Colon cancer Brother    Heart attack Mother    Heart attack Brother    Seizures Neg Hx     Social History   Socioeconomic History   Marital status: Single    Spouse name: Not on file   Number of children: 1   Years of education: Not on file   Highest education level: Not on file  Occupational History   Occupation: retired    Fish farm manager: RETIRED  Tobacco Use   Smoking status: Former    Packs/day: 0.50    Years: 15.00    Pack years: 7.50    Types: Cigarettes    Quit date: 03/23/2004    Years since quitting: 16.7   Smokeless tobacco: Never  Vaping Use   Vaping Use: Never used  Substance and Sexual Activity   Alcohol use: Yes    Alcohol/week: 1.0 standard drink    Types: 1 Glasses of wine per week    Comment: rare--wine   Drug use: No   Sexual activity: Not Currently  Other Topics Concern   Not on file  Social History Narrative   ICD-St. Jude  Remote-Yes      Live currently @ [facility] for rehabilitation. Plans to discharge to his daughter's house for continuation of rehab.   Social Determinants of Health   Financial Resource Strain: Not on file  Food Insecurity: Not on file  Transportation Needs: Not on file  Physical Activity: Not on file  Stress: Not on file  Social Connections: Not on file  Intimate Partner Violence: Not on file    Outpatient Medications Prior to Visit  Medication Sig Dispense Refill   acetaminophen (TYLENOL) 325 MG tablet Take 650 mg by mouth 4 (four) times daily as needed.     albuterol (VENTOLIN HFA) 108 (90 Base) MCG/ACT inhaler Inhale 2 puffs into the lungs every 6 (six) hours as needed for wheezing or shortness of breath. 8 g 0   baclofen (LIORESAL) 10 MG tablet Take 1 tablet (10 mg total) by mouth  2 (two) times daily as needed for muscle spasms. 20 each 0   bisacodyl (DULCOLAX) 10 MG suppository Place 1 suppository (10 mg total) rectally daily as needed for moderate constipation. 12 suppository 0   blood glucose meter kit and supplies Check sugar twice daily. DX E11.22 1 each 12   budesonide-formoterol (SYMBICORT) 160-4.5 MCG/ACT inhaler Inhale 2 puffs into the lungs 2 (two) times daily. 10.2 g 1   Cholecalciferol 50 MCG (2000 UT) TABS Take 1 tablet by mouth daily.     ciclopirox (LOPROX) 0.77 % cream Apply 1 application topically 2 (two) times daily as needed (for rash).      Clotrimazole 1 % OINT Apply 1 application topically daily. 30 g 0   dapagliflozin propanediol (FARXIGA) 10 MG TABS tablet Take 1 tablet (10 mg total) by mouth daily before breakfast. 90 tablet 1   glipiZIDE (GLUCOTROL) 10 MG tablet Take 1 tablet by mouth in the morning and at bedtime.     HYDROcodone-acetaminophen (NORCO) 5-325 MG tablet Take 1-2 tablets by mouth every 6 (six) hours as needed for severe pain (Take tylenol for mild and moderate pain). 30 tablet 0   lactulose (CHRONULAC) 10 GM/15ML solution Take 20 g by mouth 2 (two) times daily as needed for mild constipation.     levETIRAcetam (KEPPRA) 500 MG tablet Take 500-1,000 mg by mouth See admin instructions. Take one tablet by mouth in the morning and two tablets by mouth at night     levETIRAcetam (KEPPRA) 500 MG tablet 1 in the morning and 2 in the afternoon     losartan (COZAAR) 100 MG tablet Take 50 mg by mouth daily.     metFORMIN (GLUCOPHAGE) 1000 MG tablet Take 1 tablet (1,000 mg total) by mouth 2 (two) times daily with a meal. 180 tablet 3   metoprolol tartrate (LOPRESSOR) 25 MG tablet Take 12.5 mg by mouth 2 (two) times daily.     Multiple Vitamin (MULTIVITAMIN) tablet Take 1 tablet by mouth daily.     Niacin, Antihyperlipidemic, 500 MG TABS Take 500 mg by mouth daily. 30 tablet 2   nystatin-triamcinolone (MYCOLOG II) cream APPLY SMALL AMOUNT TO  AFFECTED AREA TWICE A DAY     Omega-3 Fatty Acids (FISH OIL) 1200 MG CAPS Take 1,200 mg by mouth daily.      ondansetron (ZOFRAN) 4 MG tablet Take 1 tablet (4 mg total) by mouth every 6 (six) hours as needed for nausea. 20 tablet 0   polyethylene glycol (MIRALAX / GLYCOLAX) 17 g packet Take 17 g by mouth 2 (two) times daily. 14 each 0  rivaroxaban (XARELTO) 20 MG TABS tablet Take 20 mg by mouth daily.      senna (SENOKOT) 8.6 MG TABS tablet Take 1 tablet (8.6 mg total) by mouth 2 (two) times daily. 120 tablet 0   simvastatin (ZOCOR) 20 MG tablet Take 10 mg by mouth at bedtime.      tamsulosin (FLOMAX) 0.4 MG CAPS capsule Take 0.4 mg by mouth daily.  5   No facility-administered medications prior to visit.    Allergies  Allergen Reactions   Ace Inhibitors    Lipitor [Atorvastatin] Rash    ROS Review of Systems  Constitutional:  Negative for chills and fever.  Gastrointestinal:  Negative for abdominal pain, diarrhea, nausea and vomiting.  Endocrine: Negative for polyuria.  Genitourinary:  Negative for dysuria.     Objective:    Physical Exam Vitals and nursing note reviewed.  Constitutional:      Appearance: Normal appearance.  Cardiovascular:     Rate and Rhythm: Normal rate and regular rhythm.  Pulmonary:     Effort: Pulmonary effort is normal.     Breath sounds: Normal breath sounds.  Abdominal:     General: Bowel sounds are normal.  Neurological:     Mental Status: He is alert.  Psychiatric:        Mood and Affect: Mood normal.        Behavior: Behavior normal.        Thought Content: Thought content normal.        Judgment: Judgment normal.    BP (!) 122/58   Pulse 62   Temp (!) 97.2 F (36.2 C)   Resp 12   Ht 5' 9"  (1.753 m)   Wt 190 lb 7 oz (86.4 kg)   SpO2 97%   BMI 28.12 kg/m  Wt Readings from Last 3 Encounters:  12/12/20 190 lb 7 oz (86.4 kg)  11/21/20 193 lb 12 oz (87.9 kg)  06/11/20 190 lb 12.8 oz (86.5 kg)     Health Maintenance Due  Topic  Date Due   COVID-19 Vaccine (3 - Pfizer risk series) 07/04/2019   FOOT EXAM  05/29/2020    There are no preventive care reminders to display for this patient.  Lab Results  Component Value Date   TSH 2.62 09/12/2019   Lab Results  Component Value Date   WBC 4.2 11/21/2020   HGB 13.0 11/21/2020   HCT 38.5 (L) 11/21/2020   MCV 91.6 11/21/2020   PLT 169.0 11/21/2020   Lab Results  Component Value Date   NA 128 (L) 11/21/2020   K 4.4 11/21/2020   CHLORIDE 106 01/30/2016   CO2 27 11/21/2020   GLUCOSE 380 (H) 11/21/2020   BUN 14 11/21/2020   CREATININE 1.29 11/21/2020   BILITOT 1.0 11/21/2020   ALKPHOS 72 11/21/2020   AST 18 11/21/2020   ALT 26 11/21/2020   PROT 6.7 11/21/2020   ALBUMIN 3.9 11/21/2020   CALCIUM 9.2 11/21/2020   ANIONGAP 6 02/02/2020   EGFR 61 (L) 01/30/2016   GFR 55.25 (L) 11/21/2020   Lab Results  Component Value Date   CHOL 140 11/21/2020   Lab Results  Component Value Date   HDL 40.50 11/21/2020   Lab Results  Component Value Date   LDLCALC 47 04/21/2018   Lab Results  Component Value Date   TRIG 392.0 (H) 11/21/2020   Lab Results  Component Value Date   CHOLHDL 3 11/21/2020   Lab Results  Component Value Date   HGBA1C  12.4 (H) 11/21/2020      Assessment & Plan:   Problem List Items Addressed This Visit       Endocrine   Type 2 diabetes mellitus with stage 1 chronic kidney disease, without long-term current use of insulin ([place]) - Primary    Patient here for recheck of diabetes.  Patient has been attempting to check his blood glucose but having some issues with meter.  Patient is requesting a continuous glucose monitor sent prescription to pharmacy pending insurance coverage.  Patient's glucose in office was 144 today.  Patient seems to be tolerating medication changes and additions well.  Did reiterate that I want patient to check fasting glucose the morning and postprandial before bed, patient acknowledged.  Has an appointment  scheduled February 20, 2021 we will keep appointment and recheck A1c and labs at that time.  Patient is working on diet modifications currently reducing blatant sugary objects.  Did review signs and symptoms of hypoglycemia, patient has not experienced any episodes hypoglycemia per report.  States he has experienced in the past approximate years ago so he does no signs and symptoms.      Relevant Medications   Continuous Blood Gluc Receiver (FREESTYLE LIBRE 14 DAY READER) DEVI   Continuous Blood Gluc Sensor (FREESTYLE LIBRE 14 DAY SENSOR) MISC   Other Relevant Orders   POCT glucose (manual entry) (Completed)    No orders of the defined types were placed in this encounter.   Follow-up: Return follow up as scheduled.   This visit occurred during the SARS-CoV-2 public health emergency.  Safety protocols were in place, including screening questions prior to the visit, additional usage of staff PPE, and extensive cleaning of exam room while observing appropriate contact time as indicated for disinfecting solutions.   Romilda Garret, NP

## 2020-12-26 ENCOUNTER — Ambulatory Visit: Payer: Medicare Other | Admitting: Nurse Practitioner

## 2021-01-23 ENCOUNTER — Inpatient Hospital Stay: Payer: Medicare Other | Attending: Oncology

## 2021-01-23 ENCOUNTER — Other Ambulatory Visit: Payer: Self-pay

## 2021-01-23 DIAGNOSIS — D472 Monoclonal gammopathy: Secondary | ICD-10-CM | POA: Diagnosis not present

## 2021-01-23 LAB — CBC WITH DIFFERENTIAL (CANCER CENTER ONLY)
Abs Immature Granulocytes: 0.01 10*3/uL (ref 0.00–0.07)
Basophils Absolute: 0 10*3/uL (ref 0.0–0.1)
Basophils Relative: 1 %
Eosinophils Absolute: 0 10*3/uL (ref 0.0–0.5)
Eosinophils Relative: 1 %
HCT: 45 % (ref 39.0–52.0)
Hemoglobin: 15 g/dL (ref 13.0–17.0)
Immature Granulocytes: 0 %
Lymphocytes Relative: 10 %
Lymphs Abs: 0.5 10*3/uL — ABNORMAL LOW (ref 0.7–4.0)
MCH: 30.4 pg (ref 26.0–34.0)
MCHC: 33.3 g/dL (ref 30.0–36.0)
MCV: 91.3 fL (ref 80.0–100.0)
Monocytes Absolute: 0.4 10*3/uL (ref 0.1–1.0)
Monocytes Relative: 7 %
Neutro Abs: 4.3 10*3/uL (ref 1.7–7.7)
Neutrophils Relative %: 81 %
Platelet Count: 167 10*3/uL (ref 150–400)
RBC: 4.93 MIL/uL (ref 4.22–5.81)
RDW: 12.4 % (ref 11.5–15.5)
WBC Count: 5.3 10*3/uL (ref 4.0–10.5)
nRBC: 0 % (ref 0.0–0.2)

## 2021-01-23 LAB — CMP (CANCER CENTER ONLY)
ALT: 26 U/L (ref 0–44)
AST: 19 U/L (ref 15–41)
Albumin: 4 g/dL (ref 3.5–5.0)
Alkaline Phosphatase: 53 U/L (ref 38–126)
Anion gap: 11 (ref 5–15)
BUN: 17 mg/dL (ref 8–23)
CO2: 23 mmol/L (ref 22–32)
Calcium: 9.2 mg/dL (ref 8.9–10.3)
Chloride: 103 mmol/L (ref 98–111)
Creatinine: 1.57 mg/dL — ABNORMAL HIGH (ref 0.61–1.24)
GFR, Estimated: 46 mL/min — ABNORMAL LOW (ref >60)
Glucose, Bld: 141 mg/dL — ABNORMAL HIGH (ref 70–99)
Potassium: 3.9 mmol/L (ref 3.5–5.1)
Sodium: 137 mmol/L (ref 135–145)
Total Bilirubin: 1.5 mg/dL — ABNORMAL HIGH (ref 0.3–1.2)
Total Protein: 7.6 g/dL (ref 6.5–8.1)

## 2021-01-24 LAB — KAPPA/LAMBDA LIGHT CHAINS
Kappa free light chain: 130.2 mg/L — ABNORMAL HIGH (ref 3.3–19.4)
Kappa, lambda light chain ratio: 12.76 — ABNORMAL HIGH (ref 0.26–1.65)
Lambda free light chains: 10.2 mg/L (ref 5.7–26.3)

## 2021-01-27 LAB — MULTIPLE MYELOMA PANEL, SERUM
Albumin SerPl Elph-Mcnc: 4.2 g/dL (ref 2.9–4.4)
Albumin/Glob SerPl: 1.5 (ref 0.7–1.7)
Alpha 1: 0.2 g/dL (ref 0.0–0.4)
Alpha2 Glob SerPl Elph-Mcnc: 0.6 g/dL (ref 0.4–1.0)
B-Globulin SerPl Elph-Mcnc: 0.8 g/dL (ref 0.7–1.3)
Gamma Glob SerPl Elph-Mcnc: 1.3 g/dL (ref 0.4–1.8)
Globulin, Total: 2.9 g/dL (ref 2.2–3.9)
IgA: 39 mg/dL — ABNORMAL LOW (ref 61–437)
IgG (Immunoglobin G), Serum: 1515 mg/dL (ref 603–1613)
IgM (Immunoglobulin M), Srm: 16 mg/dL (ref 15–143)
M Protein SerPl Elph-Mcnc: 0.8 g/dL — ABNORMAL HIGH
Total Protein ELP: 7.1 g/dL (ref 6.0–8.5)

## 2021-01-30 ENCOUNTER — Telehealth: Payer: Self-pay | Admitting: *Deleted

## 2021-01-30 ENCOUNTER — Ambulatory Visit: Payer: Medicare Other | Admitting: Oncology

## 2021-01-30 NOTE — Telephone Encounter (Signed)
PC to patient regarding missed appointment today, no answer left VM - instructed patient to call [facility] to speak with scheduling to reschedule his appointment. Phone number given.

## 2021-01-30 NOTE — Progress Notes (Incomplete)
Hematology and Oncology Follow Up Visit  Brad Brown [id number] 1946-01-21 75 y.o. 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  He was found to have IgG kappa without end organ damage and M spike of less than 1 g/dL.  Current therapy: Active surveillance.  Interim History:  Brad Brown is here for a follow-up visit.  Since the last visit,  .   Medications: Updated on review. Current Outpatient Medications  Medication Sig Dispense Refill   acetaminophen (TYLENOL) 325 MG tablet Take 650 mg by mouth 4 (four) times daily as needed.     albuterol (VENTOLIN HFA) 108 (90 Base) MCG/ACT inhaler Inhale 2 puffs into the lungs every 6 (six) hours as needed for wheezing or shortness of breath. 8 g 0   baclofen (LIORESAL) 10 MG tablet Take 1 tablet (10 mg total) by mouth 2 (two) times daily as needed for muscle spasms. 20 each 0   bisacodyl (DULCOLAX) 10 MG suppository Place 1 suppository (10 mg total) rectally daily as needed for moderate constipation. 12 suppository 0   blood glucose meter kit and supplies Check sugar twice daily. DX E11.22 1 each 12   budesonide-formoterol (SYMBICORT) 160-4.5 MCG/ACT inhaler Inhale 2 puffs into the lungs 2 (two) times daily. 10.2 g 1   Cholecalciferol 50 MCG (2000 UT) TABS Take 1 tablet by mouth daily.     ciclopirox (LOPROX) 0.77 % cream Apply 1 application topically 2 (two) times daily as needed (for rash).      Clotrimazole 1 % OINT Apply 1 application topically daily. 30 g 0   Continuous Blood Gluc Receiver (FREESTYLE LIBRE 14 DAY READER) DEVI 1 each by Does not apply route as directed. 2 each 11   dapagliflozin propanediol (FARXIGA) 10 MG TABS tablet Take 1 tablet (10 mg total) by mouth daily before breakfast. 90 tablet 1   glipiZIDE (GLUCOTROL) 10 MG tablet Take 1 tablet by mouth in the morning and at bedtime.     HYDROcodone-acetaminophen (NORCO) 5-325 MG tablet Take 1-2 tablets by mouth every 6 (six)  hours as needed for severe pain (Take tylenol for mild and moderate pain). 30 tablet 0   lactulose (CHRONULAC) 10 GM/15ML solution Take 20 g by mouth 2 (two) times daily as needed for mild constipation.     levETIRAcetam (KEPPRA) 500 MG tablet Take 500-1,000 mg by mouth See admin instructions. Take one tablet by mouth in the morning and two tablets by mouth at night     levETIRAcetam (KEPPRA) 500 MG tablet 1 in the morning and 2 in the afternoon     losartan (COZAAR) 100 MG tablet Take 50 mg by mouth daily.     metFORMIN (GLUCOPHAGE) 1000 MG tablet Take 1 tablet (1,000 mg total) by mouth 2 (two) times daily with a meal. 180 tablet 3   metoprolol tartrate (LOPRESSOR) 25 MG tablet Take 12.5 mg by mouth 2 (two) times daily.     Multiple Vitamin (MULTIVITAMIN) tablet Take 1 tablet by mouth daily.     Niacin, Antihyperlipidemic, 500 MG TABS Take 500 mg by mouth daily. 30 tablet 2   nystatin-triamcinolone (MYCOLOG II) cream APPLY SMALL AMOUNT TO AFFECTED AREA TWICE A DAY     Omega-3 Fatty Acids (FISH OIL) 1200 MG CAPS Take 1,200 mg by mouth daily.      ondansetron (ZOFRAN) 4 MG tablet Take 1 tablet (4 mg total) by mouth every 6 (six) hours as needed for nausea. 20 tablet 0  polyethylene glycol (MIRALAX / GLYCOLAX) 17 g packet Take 17 g by mouth 2 (two) times daily. 14 each 0   rivaroxaban (XARELTO) 20 MG TABS tablet Take 20 mg by mouth daily.      senna (SENOKOT) 8.6 MG TABS tablet Take 1 tablet (8.6 mg total) by mouth 2 (two) times daily. 120 tablet 0   simvastatin (ZOCOR) 20 MG tablet Take 10 mg by mouth at bedtime.      tamsulosin (FLOMAX) 0.4 MG CAPS capsule Take 0.4 mg by mouth daily.  5   No current facility-administered medications for this visit.    Allergies:  Allergies  Allergen Reactions   Ace Inhibitors    Lipitor [Atorvastatin] Rash      Physical Exam:      ECOG: 0     General appearance: Alert, awake without any distress. Head: Atraumatic without  abnormalities Oropharynx: Without any thrush or ulcers. Eyes: No scleral icterus. Lymph nodes: No lymphadenopathy noted in the cervical, supraclavicular, or axillary nodes Heart:regular rate and rhythm, without any murmurs or gallops.   Lung: Clear to auscultation without any rhonchi, wheezes or dullness to percussion. Abdomin: Soft, nontender without any shifting dullness or ascites. Musculoskeletal: No clubbing or cyanosis. Neurological: No motor or sensory deficits. Skin: No rashes or lesions.        Lab Results: Lab Results  Component Value Date   WBC 5.3 01/23/2021   HGB 15.0 01/23/2021   HCT 45.0 01/23/2021   MCV 91.3 01/23/2021   PLT 167 01/23/2021     Chemistry      Component Value Date/Time   NA 137 01/23/2021 1029   NA 140 01/30/2016 1129   K 3.9 01/23/2021 1029   K 4.4 01/30/2016 1129   CL 103 01/23/2021 1029   CL 104 01/06/2012 1355   CO2 23 01/23/2021 1029   CO2 26 01/30/2016 1129   BUN 17 01/23/2021 1029   BUN 15.0 01/30/2016 1129   CREATININE 1.57 (H) 01/23/2021 1029   CREATININE 1.29 (H) 02/17/2016 1011   CREATININE 1.4 (H) 01/30/2016 1129      Component Value Date/Time   CALCIUM 9.2 01/23/2021 1029   CALCIUM 9.4 01/30/2016 1129   ALKPHOS 53 01/23/2021 1029   ALKPHOS 78 01/30/2016 1129   AST 19 01/23/2021 1029   AST 20 01/30/2016 1129   ALT 26 01/23/2021 1029   ALT 28 01/30/2016 1129   BILITOT 1.5 (H) 01/23/2021 1029   BILITOT 0.82 01/30/2016 1129      Results for Brad Brown, Brad Brown (MRN [id number]) as of 01/30/2021 12:50  Ref. Range 02/03/2019 11:06 02/02/2020 10:18 01/23/2021 10:29  M Protein SerPl Elph-Mcnc Latest Ref Range: Not Observed g/dL 1.0 (H) 1.0 (H) 0.8 (H)  IFE 1 Unknown Comment (A) Comment (A) Comment (A)  Globulin, Total Latest Ref Range: 2.2 - 3.9 g/dL 3.0 2.9 2.9  B-Globulin SerPl Elph-Mcnc Latest Ref Range: 0.7 - 1.3 g/dL 0.8 0.8 0.8  IgG (Immunoglobin G), Serum Latest Ref Range: 603 - 1,613 mg/dL 1,646 (H) 1,467 1,515   IgM (Immunoglobulin M), Srm Latest Ref Range: 15 - 143 mg/dL 15 15 16   IgA Latest Ref Range: 61 - 437 mg/dL 32 (L) 36 (L) 39 (L)      Impression and Plan:  75 year old man with  1.  Monoclonal gammopathy of undetermined significance diagnosed in 2008.  He was found to have IgG kappa subtype.   The natural course of this disease was reviewed at this time and treatment choices were discussed.  Protein studies obtained on 01/23/2021 were personally reviewed and showed no evidence of progression into multiple myeloma.  His M spike remains less than 1 g/dL.  He has no evidence of endorgan damage with protein studies has not changed in the last 14 years.  I have recommended continued active surveillance at this time.  Risk of progression into multiple myeloma of 1 %/year was discussed today.  Treatment at that time will be discussed if he does develop that in the future.  2.  Renal insufficiency: His kidney function remains close to baseline without any changes.  This is not related to his plasma cell disorder.  3.  Covid vaccination considerations: I recommended that continuing his vaccination series with a booster.  4.  Follow-up: Will be in 1 year to repeat testing.  30  Minutes were spent on this visit.  The time was dedicated to reviewing laboratory data, disease status update and outlining future plan of care.     Zola Button, MD 11/10/202212:48 PM

## 2021-01-31 ENCOUNTER — Other Ambulatory Visit: Payer: Self-pay | Admitting: Oncology

## 2021-01-31 NOTE — Progress Notes (Signed)
And results of laboratory data obtained on January 23, 2021 were personally reviewed and discussed with the patient via phone today and continues to show stable on protein studies which has been consistent with previous counts.  No evidence to suggest endorgan damage or progression into multiple myeloma.  Plan is to continue with annual surveillance and we will update his laboratory testing in 1 year.  All his questions were answered and he is elected proceed.

## 2021-02-20 ENCOUNTER — Ambulatory Visit (INDEPENDENT_AMBULATORY_CARE_PROVIDER_SITE_OTHER): Payer: Medicare Other | Admitting: Nurse Practitioner

## 2021-02-20 ENCOUNTER — Other Ambulatory Visit: Payer: Self-pay

## 2021-02-20 VITALS — BP 140/78 | HR 64 | Temp 98.4°F | Resp 14 | Ht 67.5 in | Wt 185.2 lb

## 2021-02-20 DIAGNOSIS — Z0000 Encounter for general adult medical examination without abnormal findings: Secondary | ICD-10-CM | POA: Diagnosis not present

## 2021-02-20 DIAGNOSIS — L602 Onychogryphosis: Secondary | ICD-10-CM | POA: Diagnosis not present

## 2021-02-20 DIAGNOSIS — I10 Essential (primary) hypertension: Secondary | ICD-10-CM | POA: Diagnosis not present

## 2021-02-20 DIAGNOSIS — N181 Chronic kidney disease, stage 1: Secondary | ICD-10-CM | POA: Diagnosis not present

## 2021-02-20 DIAGNOSIS — R350 Frequency of micturition: Secondary | ICD-10-CM

## 2021-02-20 DIAGNOSIS — D472 Monoclonal gammopathy: Secondary | ICD-10-CM

## 2021-02-20 DIAGNOSIS — N401 Enlarged prostate with lower urinary tract symptoms: Secondary | ICD-10-CM

## 2021-02-20 DIAGNOSIS — E782 Mixed hyperlipidemia: Secondary | ICD-10-CM

## 2021-02-20 DIAGNOSIS — E1122 Type 2 diabetes mellitus with diabetic chronic kidney disease: Secondary | ICD-10-CM | POA: Diagnosis not present

## 2021-02-20 DIAGNOSIS — R569 Unspecified convulsions: Secondary | ICD-10-CM | POA: Diagnosis not present

## 2021-02-20 LAB — POCT GLYCOSYLATED HEMOGLOBIN (HGB A1C): Hemoglobin A1C: 7.3 % — AB (ref 4.0–5.6)

## 2021-02-20 NOTE — Assessment & Plan Note (Signed)
Reviewed appropriate immunizations and screenings for patient's age.  Did discuss paperwork and reviewed with patient we will send to be scanned in patient's chart.

## 2021-02-20 NOTE — Assessment & Plan Note (Signed)
Patient's A1c had a dramatic reduction from the 12th to the seventh.  Patient was made aware in office was some discrepancy with his medications of being on 2 different SGLT2 inhibitors.  Patient did not bring medication with him in office he was given strict instructions to take 1 or the other and to call office to let me know which medication he is taking.  This was given to him on his AVS.  Did reach out to the [place] to see what they are managing versus alignment and managing.  Patient has had no hypoglycemia but did encouraged to continue taking blood glucose twice daily.  Patient acknowledged.  We will check liver kidney function in office today.  Continue taking glipizide 10 mg twice daily, Farxiga 10 mg, metformin 1000 mg twice daily.

## 2021-02-20 NOTE — Assessment & Plan Note (Signed)
Currently maintained on Zocor pending lipid panel.

## 2021-02-20 NOTE — Assessment & Plan Note (Signed)
Has frequent urination states that he is on tamsulosin patient also states he is followed by urology.

## 2021-02-20 NOTE — Assessment & Plan Note (Signed)
Patient evaluated by hematology continue to follow-up as scheduled and recommended.

## 2021-02-20 NOTE — Assessment & Plan Note (Signed)
Continue current medications losartan and metoprolol.  Continue checking blood pressures at home and monitoring

## 2021-02-20 NOTE — Progress Notes (Signed)
Established Patient Office Visit  Subjective:  Patient ID: Brad Brown, male    DOB: 29-Nov-1945  Age: 75 y.o. MRN: [id number]  CC:  Chief Complaint  Patient presents with   Medicare Wellness    HPI Brad Brown presents for [employer] Annual wellness  Checks his glucose twice daily and  130s-200s No low glucose readings readings HTN: daily checks unsure of the numbers but nothing high/out of range Seizures: see [facility] and currenlty on Keppra  Dr Crissie Sickles for cardiology  Dr Alen Blew heme/Onc for MGUS.  Dr. Marval [facility]  [facility] [place] [facility] needs  for complete physical and follow up of chronic conditions.  Immunizations: -Tetanus:UTD -Influenza:UTD -Covid-19: UTD -Shingles: -Pneumonia: UTD  -HPV:NA   Dexa: never Colonoscopy: 07/26/2019 with 3 year recall  PSA: 11/2020  Lung Cancer Screening: NA Fall Risk 10/20/2018 10/21/2018 10/21/2018 05/30/2019 11/21/2020  Falls in the past year? - - - 0 1  Was there an injury with Fall? - - - - 1  Fall Risk Category Calculator - - - - 2  Fall Risk Category - - - - Moderate  Patient Fall Risk Level High fall risk High fall risk High fall risk Low fall risk Moderate fall risk     Hearing Screening   500Hz  1000Hz  2000Hz  4000Hz   Right ear 25 25 25  0  Left ear 25 25 25  0  Vision Screening - Comments:: 11/20/20 [facility]   Past Medical History:  Diagnosis Date   Acute myocardial infarction [facility]) 06/1983   Arthritis    Atrial fibrillation (HCC)    Automatic implantable cardiac defibrillator in situ    Borderline diabetes mellitus    BPH without obstruction/lower urinary tract symptoms    CAD (coronary artery disease)    Chronic kidney disease, stage 1    Colonic polyp 2004   hyperplastic    COPD (chronic obstructive pulmonary disease) (HCC)    Diabetes mellitus without complication ([place])    Fall    GERD (gastroesophageal reflux disease)    History of renal  insufficiency syndrome    HTN (hypertension)    Hyperlipidemia    Knee derangement 09/2018   LEFT KNEE   Monoclonal gammopathy    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 Jude single chamber defibrillator   COLONOSCOPY  07/26/2019   EP IMPLANTABLE DEVICE N/A 02/21/2016   Procedure: ICD Generator Changeout;  Surgeon: Evans Lance, MD;  Location: [place] [facility];  Service: Cardiovascular;  Laterality: N/A;   INGUINAL HERNIA REPAIR Left    QUADRICEPS TENDON REPAIR Left 10/20/2018   Procedure: REPAIR QUADRICEP TENDON;  Surgeon: Renette Butters, MD;  Location: [place];  Service: Orthopedics;  Laterality: Left;    Family History  Problem Relation Age of Onset   Colon cancer Brother    Heart attack Mother    Heart attack Brother    Seizures Neg Hx     Social History   Socioeconomic History   Marital status: Single    Spouse name: Not on file   Number of children: 1   Years of education: Not on file   Highest  education level: Not on file  Occupational History   Occupation: retired    Fish farm manager: RETIRED  Tobacco Use   Smoking status: Former    Packs/day: 0.50    Years: 15.00    Pack years: 7.50    Types: Cigarettes    Quit date: 03/23/2004    Years since quitting: 16.9   Smokeless tobacco: Never  Vaping Use   Vaping Use: Never used  Substance and Sexual Activity   Alcohol use: Yes    Alcohol/week: 1.0 standard drink    Types: 1 Glasses of wine per week    Comment: rare--wine   Drug use: No   Sexual activity: Not Currently  Other Topics Concern   Not on file  Social History Narrative   ICD-St. Jude  Remote-Yes      Live currently @ [facility] for rehabilitation. Plans to discharge  to his daughter's house for continuation of rehab.   Social Determinants of Health   Financial Resource Strain: Not on file  Food Insecurity: Not on file  Transportation Needs: Not on file  Physical Activity: Not on file  Stress: Not on file  Social Connections: Not on file  Intimate Partner Violence: Not on file    Outpatient Medications Prior to Visit  Medication Sig Dispense Refill   acetaminophen (TYLENOL) 325 MG tablet Take 650 mg by mouth 4 (four) times daily as needed.     albuterol (VENTOLIN HFA) 108 (90 Base) MCG/ACT inhaler Inhale 2 puffs into the lungs every 6 (six) hours as needed for wheezing or shortness of breath. 8 g 0   baclofen (LIORESAL) 10 MG tablet Take 1 tablet (10 mg total) by mouth 2 (two) times daily as needed for muscle spasms. 20 each 0   bisacodyl (DULCOLAX) 10 MG suppository Place 1 suppository (10 mg total) rectally daily as needed for moderate constipation. 12 suppository 0   blood glucose meter kit and supplies Check sugar twice daily. DX E11.22 1 each 12   budesonide-formoterol (SYMBICORT) 160-4.5 MCG/ACT inhaler Inhale 2 puffs into the lungs 2 (two) times daily. 10.2 g 1   Cholecalciferol 50 MCG (2000 UT) TABS Take 1 tablet by mouth daily.     ciclopirox (LOPROX) 0.77 % cream Apply 1 application topically 2 (two) times daily as needed (for rash).      Clotrimazole 1 % OINT Apply 1 application topically daily. 30 g 0   Continuous Blood Gluc Receiver (FREESTYLE LIBRE 14 DAY READER) DEVI 1 each by Does not apply route as directed. 2 each 11   dapagliflozin propanediol (FARXIGA) 10 MG TABS tablet Take 1 tablet (10 mg total) by mouth daily before breakfast. 90 tablet 1   empagliflozin (JARDIANCE) 25 MG TABS tablet Take 0.5 tablets by mouth every morning.     glipiZIDE (GLUCOTROL) 10 MG tablet Take 1 tablet by mouth in the morning and at bedtime.     HYDROcodone-acetaminophen (NORCO) 5-325 MG tablet Take 1-2 tablets by mouth every 6 (six) hours as needed for  severe pain (Take tylenol for mild and moderate pain). 30 tablet 0   lactulose (CHRONULAC) 10 GM/15ML solution Take 20 g by mouth 2 (two) times daily as needed for mild constipation.     levETIRAcetam (KEPPRA) 500 MG tablet Take 500-1,000 mg by mouth See admin instructions. Take one tablet by mouth in the morning and two tablets by mouth at night     levETIRAcetam (KEPPRA) 500 MG tablet 1 in the morning and 2 in the afternoon  losartan (COZAAR) 100 MG tablet Take 50 mg by mouth daily.     metFORMIN (GLUCOPHAGE) 1000 MG tablet Take 1 tablet (1,000 mg total) by mouth 2 (two) times daily with a meal. 180 tablet 3   metoprolol tartrate (LOPRESSOR) 25 MG tablet Take 12.5 mg by mouth 2 (two) times daily.     Multiple Vitamin (MULTIVITAMIN) tablet Take 1 tablet by mouth daily.     Niacin, Antihyperlipidemic, 500 MG TABS Take 500 mg by mouth daily. 30 tablet 2   nystatin-triamcinolone (MYCOLOG II) cream APPLY SMALL AMOUNT TO AFFECTED AREA TWICE A DAY     Omega-3 Fatty Acids (FISH OIL) 1200 MG CAPS Take 1,200 mg by mouth daily.      ondansetron (ZOFRAN) 4 MG tablet Take 1 tablet (4 mg total) by mouth every 6 (six) hours as needed for nausea. 20 tablet 0   polyethylene glycol (MIRALAX / GLYCOLAX) 17 g packet Take 17 g by mouth 2 (two) times daily. 14 each 0   rivaroxaban (XARELTO) 20 MG TABS tablet Take 20 mg by mouth daily.      senna (SENOKOT) 8.6 MG TABS tablet Take 1 tablet (8.6 mg total) by mouth 2 (two) times daily. 120 tablet 0   simvastatin (ZOCOR) 20 MG tablet Take 10 mg by mouth at bedtime.      tamsulosin (FLOMAX) 0.4 MG CAPS capsule Take 0.4 mg by mouth daily.  5   No facility-administered medications prior to visit.    Allergies  Allergen Reactions   Ace Inhibitors    Lipitor [Atorvastatin] Rash    ROS Review of Systems  Constitutional:  Negative for chills and fever.  Respiratory:  Negative for cough and shortness of breath.   Cardiovascular:  Negative for chest pain.   Gastrointestinal:  Negative for diarrhea, nausea and vomiting.  Genitourinary:  Positive for frequency. Negative for decreased urine volume, difficulty urinating, hematuria, penile discharge, penile pain, penile swelling, scrotal swelling and testicular pain.  Neurological:  Negative for weakness.     Objective:    Physical Exam Vitals and nursing note reviewed.  Constitutional:      Appearance: Normal appearance.  HENT:     Right Ear: Tympanic membrane, ear canal and external ear normal. There is no impacted cerumen.     Left Ear: Tympanic membrane, ear canal and external ear normal. There is no impacted cerumen.     Mouth/Throat:     Mouth: Mucous membranes are dry.  Eyes:     Extraocular Movements: Extraocular movements intact.     Pupils: Pupils are equal, round, and reactive to light.  Neck:     Thyroid: No thyroid mass, thyromegaly or thyroid tenderness.  Cardiovascular:     Rate and Rhythm: Normal rate and regular rhythm.     Pulses:          Dorsalis pedis pulses are 1+ on the right side and 1+ on the left side.       Posterior tibial pulses are 1+ on the right side and 1+ on the left side.  Pulmonary:     Effort: Pulmonary effort is normal.     Breath sounds: Normal breath sounds.  Abdominal:     General: Bowel sounds are normal. There is no distension.     Palpations: There is no mass.     Tenderness: There is no abdominal tenderness.  Musculoskeletal:     Right lower leg: No edema.     Left lower leg: No edema.  Lymphadenopathy:  Cervical: No cervical adenopathy.  Neurological:     General: No focal deficit present.     Mental Status: He is alert.  Psychiatric:        Mood and Affect: Mood normal.        Behavior: Behavior normal.        Thought Content: Thought content normal.        Judgment: Judgment normal.    BP 140/78   Pulse 64   Temp 98.4 F (36.9 C)   Resp 14   Ht 5' 7.5" (1.715 m)   Wt 185 lb 4 oz (84 kg)   SpO2 97%   BMI 28.59 kg/m   Wt Readings from Last 3 Encounters:  02/20/21 185 lb 4 oz (84 kg)  12/12/20 190 lb 7 oz (86.4 kg)  11/21/20 193 lb 12 oz (87.9 kg)     Health Maintenance Due  Topic Date Due   COVID-19 Vaccine (3 - Pfizer risk series) 07/04/2019   FOOT EXAM  05/29/2020    There are no preventive care reminders to display for this patient.  Lab Results  Component Value Date   TSH 2.62 09/12/2019   Lab Results  Component Value Date   WBC 5.3 01/23/2021   HGB 15.0 01/23/2021   HCT 45.0 01/23/2021   MCV 91.3 01/23/2021   PLT 167 01/23/2021   Lab Results  Component Value Date   NA 137 01/23/2021   K 3.9 01/23/2021   CHLORIDE 106 01/30/2016   CO2 23 01/23/2021   GLUCOSE 141 (H) 01/23/2021   BUN 17 01/23/2021   CREATININE 1.57 (H) 01/23/2021   BILITOT 1.5 (H) 01/23/2021   ALKPHOS 53 01/23/2021   AST 19 01/23/2021   ALT 26 01/23/2021   PROT 7.6 01/23/2021   ALBUMIN 4.0 01/23/2021   CALCIUM 9.2 01/23/2021   ANIONGAP 11 01/23/2021   EGFR 61 (L) 01/30/2016   GFR 55.25 (L) 11/21/2020   Lab Results  Component Value Date   CHOL 140 11/21/2020   Lab Results  Component Value Date   HDL 40.50 11/21/2020   Lab Results  Component Value Date   LDLCALC 47 04/21/2018   Lab Results  Component Value Date   TRIG 392.0 (H) 11/21/2020   Lab Results  Component Value Date   CHOLHDL 3 11/21/2020   Lab Results  Component Value Date   HGBA1C 12.4 (H) 11/21/2020      Assessment & Plan:   Problem List Items Addressed This Visit       Cardiovascular and Mediastinum   Hypertension    Continue current medications losartan and metoprolol.  Continue checking blood pressures at home and monitoring        Endocrine   Type 2 diabetes mellitus with stage 1 chronic kidney disease, without long-term current use of insulin ([place]) - Primary    Patient's A1c had a dramatic reduction from the 12th to the seventh.  Patient was made aware in office was some discrepancy with his medications of  being on 2 different SGLT2 inhibitors.  Patient did not bring medication with him in office he was given strict instructions to take 1 or the other and to call office to let me know which medication he is taking.  This was given to him on his AVS.  Did reach out to the [place] to see what they are managing versus alignment and managing.  Patient has had no hypoglycemia but did encouraged to continue taking blood glucose twice daily.  Patient  acknowledged.  We will check liver kidney function in office today.  Continue taking glipizide 10 mg twice daily, Farxiga 10 mg, metformin 1000 mg twice daily.      Relevant Medications   empagliflozin (JARDIANCE) 25 MG TABS tablet   Other Relevant Orders   POCT glycosylated hemoglobin (Hb A1C) (Completed)   CBC   Comprehensive metabolic panel     Genitourinary   BPH (benign prostatic hyperplasia)    Has frequent urination states that he is on tamsulosin patient also states he is followed by urology.        Other   HLD (hyperlipidemia)    Currently maintained on Zocor pending lipid panel.      Relevant Orders   Lipid panel   Seizures ([place])    Patient on Keppra, continue medication as prescribed.  Continue following up with [facility] as scheduled      MGUS (monoclonal gammopathy of unknown significance)    Patient evaluated by hematology continue to follow-up as scheduled and recommended.      Medicare annual wellness visit, subsequent    Reviewed appropriate immunizations and screenings for patient's age.  Did discuss paperwork and reviewed with patient we will send to be scanned in patient's chart.      Thickened nails    Several thickened nails that look to be fungal related we will send to podiatry for further management.      Relevant Orders   Ambulatory referral to Podiatry    No orders of the defined types were placed in this encounter.   Follow-up: Return in about 3 months (around 05/21/2021) for recheck.    This visit occurred during the SARS-CoV-2 public health emergency.  Safety protocols were in place, including screening questions prior to the visit, additional usage of staff PPE, and extensive cleaning of exam room while observing appropriate contact time as indicated for disinfecting solutions.   Romilda Garret, NP

## 2021-02-20 NOTE — Assessment & Plan Note (Signed)
Patient on Keppra, continue medication as prescribed.  Continue following up with [facility] as scheduled

## 2021-02-20 NOTE — Assessment & Plan Note (Signed)
Several thickened nails that look to be fungal related we will send to podiatry for further management.

## 2021-02-20 NOTE — Patient Instructions (Addendum)
I need you to look at home and let me know what medications you are taking Jardiance (empagliflozin) Stop taking this one if you have been taking it Farxiga (Dapagliflozin) - this one you should be taking  Call the office at [phone] and let us know  See you in 3 months

## 2021-02-21 ENCOUNTER — Telehealth: Payer: Self-pay

## 2021-02-21 LAB — COMPREHENSIVE METABOLIC PANEL
ALT: 30 U/L (ref 0–53)
AST: 21 U/L (ref 0–37)
Albumin: 4.5 g/dL (ref 3.5–5.2)
Alkaline Phosphatase: 46 U/L (ref 39–117)
BUN: 16 mg/dL (ref 6–23)
CO2: 23 mEq/L (ref 19–32)
Calcium: 9.9 mg/dL (ref 8.4–10.5)
Chloride: 103 mEq/L (ref 96–112)
Creatinine, Ser: 1.47 mg/dL (ref 0.40–1.50)
GFR: 47.15 mL/min — ABNORMAL LOW (ref >60.00)
Glucose, Bld: 83 mg/dL (ref 70–99)
Potassium: 4 mEq/L (ref 3.5–5.1)
Sodium: 135 mEq/L (ref 135–145)
Total Bilirubin: 0.7 mg/dL (ref 0.2–1.2)
Total Protein: 7.5 g/dL (ref 6.0–8.3)

## 2021-02-21 LAB — CBC
HCT: 45.2 % (ref 39.0–52.0)
Hemoglobin: 14.9 g/dL (ref 13.0–17.0)
MCHC: 33 g/dL (ref 30.0–36.0)
MCV: 91.4 fl (ref 78.0–100.0)
Platelets: 180 10*3/uL (ref 150.0–400.0)
RBC: 4.94 Mil/uL (ref 4.22–5.81)
RDW: 13.7 % (ref 11.5–15.5)
WBC: 6.3 10*3/uL (ref 4.0–10.5)

## 2021-02-21 LAB — LIPID PANEL
Cholesterol: 142 mg/dL (ref 0–200)
HDL: 49 mg/dL (ref >39.00)
NonHDL: 92.67
Total CHOL/HDL Ratio: 3
Triglycerides: 275 mg/dL — ABNORMAL HIGH (ref 0.0–149.0)
VLDL: 55 mg/dL — ABNORMAL HIGH (ref 0.0–40.0)

## 2021-02-21 LAB — LDL CHOLESTEROL, DIRECT: Direct LDL: 27 mg/dL

## 2021-02-21 NOTE — Telephone Encounter (Signed)
Great thank you!

## 2021-02-21 NOTE — Telephone Encounter (Signed)
[name] - Client Nonclinical Telephone Record  [facility] Client [facility] Night - Client Client Site [place] Provider Romilda Garret- NP Contact Type Call Who Is Calling Patient / Member / Family / Caregiver Caller Name [place] Phone Number [phone] Patient Name Brad Brown Patient DOB 28-Jan-1946 Call Type Message Only Information Provided Reason for Call Request for General Office Information Initial Comment Caller states he wants to let the doctor know he's not Jordiance. he's taking Farxiga Disp. Time Disposition Final User 02/20/2021 5:49:07 PM General Information Provided Yes Sherri Sear Call Closed By: Sherri Sear Transaction Date/Time: 02/20/2021 5:44:19 PM (ET

## 2021-02-25 ENCOUNTER — Telehealth: Payer: Self-pay | Admitting: Nurse Practitioner

## 2021-02-25 NOTE — Telephone Encounter (Signed)
Received phone call Friday 02/21/2021 from Dr. Apolonio Schneiders in regards to patient. I am trying to clarify what [facility] is [facility] and what I need to manage on the patient as there was some confusion with medications. I returned the call and left a message on [employer].

## 2021-02-25 NOTE — Telephone Encounter (Signed)
noted 

## 2021-02-26 ENCOUNTER — Ambulatory Visit (INDEPENDENT_AMBULATORY_CARE_PROVIDER_SITE_OTHER): Payer: Medicare Other

## 2021-02-26 DIAGNOSIS — I255 Ischemic cardiomyopathy: Secondary | ICD-10-CM

## 2021-02-26 LAB — CUP PACEART REMOTE DEVICE CHECK
Battery Remaining Longevity: 59 mo
Battery Remaining Percentage: 56 %
Battery Voltage: 2.93 V
Brady Statistic RV Percent Paced: 1.3 %
Date Time Interrogation Session: 20221207093341
HighPow Impedance: 51 Ohm
HighPow Impedance: 51 Ohm
Implantable Lead Implant Date: 20080527
Implantable Lead Location: 753860
Implantable Lead Model: 7121
Implantable Pulse Generator Implant Date: 20171201
Lead Channel Impedance Value: 530 Ohm
Lead Channel Pacing Threshold Amplitude: 1 V
Lead Channel Pacing Threshold Pulse Width: 0.5 ms
Lead Channel Sensing Intrinsic Amplitude: 10.5 mV
Lead Channel Setting Pacing Amplitude: 2.5 V
Lead Channel Setting Pacing Pulse Width: 0.5 ms
Lead Channel Setting Sensing Sensitivity: 0.5 mV
Pulse Gen Serial Number: 7283394

## 2021-03-07 NOTE — Progress Notes (Signed)
Remote ICD transmission.   

## 2021-03-11 ENCOUNTER — Other Ambulatory Visit: Payer: Self-pay

## 2021-03-11 ENCOUNTER — Ambulatory Visit: Payer: Medicare Other | Admitting: Podiatry

## 2021-03-11 DIAGNOSIS — Z79899 Other long term (current) drug therapy: Secondary | ICD-10-CM | POA: Diagnosis not present

## 2021-03-11 DIAGNOSIS — B351 Tinea unguium: Secondary | ICD-10-CM

## 2021-03-11 NOTE — Progress Notes (Signed)
Subjective:  Patient ID: STEPFON RAWLES, male    DOB: November 06, 1945,  MRN: [id number]  Chief Complaint  Patient presents with   Nail Problem    Left foot 3rd toe fungus under the nail     75 y.o. male presents with the above complaint.  Patient presents with left third digit onychomycosis with thickened elongated dystrophic nail.  Patient states has progressed to gotten worse.  Is gotten more thick and hard to cut.Marland Kitchen  He would like to discuss treatment options.  He has not done anything for it.  He has not seen anyone as per to see me.  He denies any other acute complaints.  He is a diabetic with last A1c of 7.2.  He also has a chronic kidney disease.   Review of Systems: Negative except as noted in the HPI. Denies N/V/F/Ch.  Past Medical History:  Diagnosis Date   Acute myocardial infarction [facility]) 06/1983   Arthritis    Atrial fibrillation (HCC)    Automatic implantable cardiac defibrillator in situ    Borderline diabetes mellitus    BPH without obstruction/lower urinary tract symptoms    CAD (coronary artery disease)    Chronic kidney disease, stage 1    Colonic polyp 2004   hyperplastic    COPD (chronic obstructive pulmonary disease) (HCC)    Diabetes mellitus without complication ([place])    Fall    GERD (gastroesophageal reflux disease)    History of renal insufficiency syndrome    HTN (hypertension)    Hyperlipidemia    Knee derangement 09/2018   LEFT KNEE   Monoclonal gammopathy    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)    Seizure disorder ([place])    Stroke ([place])    pt reports he has had 3    Current Outpatient Medications:    acetaminophen (TYLENOL) 325 MG tablet, Take 650 mg by mouth 4 (four) times daily as needed., Disp: , Rfl:    albuterol (VENTOLIN HFA) 108 (90 Base) MCG/ACT inhaler, Inhale 2 puffs into the lungs every 6 (six) hours as needed for wheezing or shortness of breath., Disp: 8 g, Rfl: 0   baclofen (LIORESAL) 10 MG tablet, Take 1 tablet (10  mg total) by mouth 2 (two) times daily as needed for muscle spasms., Disp: 20 each, Rfl: 0   bisacodyl (DULCOLAX) 10 MG suppository, Place 1 suppository (10 mg total) rectally daily as needed for moderate constipation., Disp: 12 suppository, Rfl: 0   blood glucose meter kit and supplies, Check sugar twice daily. DX E11.22, Disp: 1 each, Rfl: 12   budesonide-formoterol (SYMBICORT) 160-4.5 MCG/ACT inhaler, Inhale 2 puffs into the lungs 2 (two) times daily., Disp: 10.2 g, Rfl: 1   Cholecalciferol 50 MCG (2000 UT) TABS, Take 1 tablet by mouth daily., Disp: , Rfl:    ciclopirox (LOPROX) 0.77 % cream, Apply 1 application topically 2 (two) times daily as needed (for rash). , Disp: , Rfl:    Clotrimazole 1 % OINT, Apply 1 application topically daily., Disp: 30 g, Rfl: 0   Continuous Blood Gluc Receiver (FREESTYLE LIBRE 14 DAY READER) DEVI, 1 each by Does not apply route as directed., Disp: 2 each, Rfl: 11   dapagliflozin propanediol (FARXIGA) 10 MG TABS tablet, Take 1 tablet (10 mg total) by mouth daily before breakfast., Disp: 90 tablet, Rfl: 1   empagliflozin (JARDIANCE) 25 MG TABS tablet, Take 0.5 tablets by mouth every morning., Disp: , Rfl:    glipiZIDE (GLUCOTROL) 10 MG tablet, Take 1  tablet by mouth in the morning and at bedtime., Disp: , Rfl:    HYDROcodone-acetaminophen (NORCO) 5-325 MG tablet, Take 1-2 tablets by mouth every 6 (six) hours as needed for severe pain (Take tylenol for mild and moderate pain)., Disp: 30 tablet, Rfl: 0   lactulose (CHRONULAC) 10 GM/15ML solution, Take 20 g by mouth 2 (two) times daily as needed for mild constipation., Disp: , Rfl:    levETIRAcetam (KEPPRA) 500 MG tablet, Take 500-1,000 mg by mouth See admin instructions. Take one tablet by mouth in the morning and two tablets by mouth at night, Disp: , Rfl:    levETIRAcetam (KEPPRA) 500 MG tablet, 1 in the morning and 2 in the afternoon, Disp: , Rfl:    losartan (COZAAR) 100 MG tablet, Take 50 mg by mouth daily., Disp: ,  Rfl:    metFORMIN (GLUCOPHAGE) 1000 MG tablet, Take 1 tablet (1,000 mg total) by mouth 2 (two) times daily with a meal., Disp: 180 tablet, Rfl: 3   metoprolol tartrate (LOPRESSOR) 25 MG tablet, Take 12.5 mg by mouth 2 (two) times daily., Disp: , Rfl:    Multiple Vitamin (MULTIVITAMIN) tablet, Take 1 tablet by mouth daily., Disp: , Rfl:    Niacin, Antihyperlipidemic, 500 MG TABS, Take 500 mg by mouth daily., Disp: 30 tablet, Rfl: 2   nystatin-triamcinolone (MYCOLOG II) cream, APPLY SMALL AMOUNT TO AFFECTED AREA TWICE A DAY, Disp: , Rfl:    Omega-3 Fatty Acids (FISH OIL) 1200 MG CAPS, Take 1,200 mg by mouth daily. , Disp: , Rfl:    ondansetron (ZOFRAN) 4 MG tablet, Take 1 tablet (4 mg total) by mouth every 6 (six) hours as needed for nausea., Disp: 20 tablet, Rfl: 0   polyethylene glycol (MIRALAX / GLYCOLAX) 17 g packet, Take 17 g by mouth 2 (two) times daily., Disp: 14 each, Rfl: 0   rivaroxaban (XARELTO) 20 MG TABS tablet, Take 20 mg by mouth daily. , Disp: , Rfl:    senna (SENOKOT) 8.6 MG TABS tablet, Take 1 tablet (8.6 mg total) by mouth 2 (two) times daily., Disp: 120 tablet, Rfl: 0   simvastatin (ZOCOR) 20 MG tablet, Take 10 mg by mouth at bedtime. , Disp: , Rfl:    tamsulosin (FLOMAX) 0.4 MG CAPS capsule, Take 0.4 mg by mouth daily., Disp: , Rfl: 5  Social History   Tobacco Use  Smoking Status Former   Packs/day: 0.50   Years: 15.00   Pack years: 7.50   Types: Cigarettes   Quit date: 03/23/2004   Years since quitting: 16.9  Smokeless Tobacco Never    Allergies  Allergen Reactions   Ace Inhibitors    Lipitor [Atorvastatin] Rash   Objective:  There were no vitals filed for this visit. There is no height or weight on file to calculate BMI. Constitutional Well developed. Well nourished.  Vascular Dorsalis pedis pulses palpable bilaterally. Posterior tibial pulses palpable bilaterally. Capillary refill normal to all digits.  No cyanosis or clubbing noted. Pedal hair growth  normal.  Neurologic Normal speech. Oriented to person, place, and time. Epicritic sensation to light touch grossly present bilaterally.  Dermatologic Nails left third digit onychomycosis thickened elongated dystrophic mycotic nail.  Mild pain on palpation Skin within normal limits  Orthopedic: Normal joint ROM without pain or crepitus bilaterally. No visible deformities. No bony tenderness.   Radiographs: None Assessment:   1. Long-term use of high-risk medication   2. Nail fungus   3. Onychomycosis due to dermatophyte    Plan:  Patient was evaluated and treated and all questions answered.  Left third digit onychomycosis -Educated the patient on the etiology of onychomycosis and various treatment options associated with improving the fungal load.  I explained to the patient that there is 3 treatment options available to treat the onychomycosis including topical, p.o., laser treatment.  Patient elected to undergo p.o. options with Lamisil/terbinafine therapy.  In order for me to start the medication therapy, I explained to the patient the importance of evaluating the liver and obtaining the liver function test.  Once the liver function test comes back normal I will start him on 51-monthcourse of Lamisil therapy.  Patient understood all risk and would like to proceed with Lamisil therapy.  I have asked the patient to immediately stop the Lamisil therapy if she has any reactions to it and call the office or go to the emergency room right away.  Patient states understanding   No follow-ups on file.

## 2021-03-12 ENCOUNTER — Other Ambulatory Visit: Payer: Self-pay | Admitting: Podiatry

## 2021-03-12 LAB — HEPATIC FUNCTION PANEL
ALT: 23 IU/L (ref 0–44)
AST: 19 IU/L (ref 0–40)
Albumin: 4.5 g/dL (ref 3.7–4.7)
Alkaline Phosphatase: 55 IU/L (ref 44–121)
Bilirubin Total: 0.8 mg/dL (ref 0.0–1.2)
Bilirubin, Direct: 0.24 mg/dL (ref 0.00–0.40)
Total Protein: 7.1 g/dL (ref 6.0–8.5)

## 2021-03-12 MED ORDER — TERBINAFINE HCL 250 MG PO TABS: 250.0000 mg | ORAL_TABLET | Freq: Every day | ORAL | 0 refills | Status: DC

## 2021-03-14 IMAGING — DX RIGHT KNEE - COMPLETE 4+ VIEW
4 series · 4 of 4 positions shown · non-contrast
Comparison: None.

CLINICAL DATA: Right knee pain after a fall today.

EXAM:
RIGHT KNEE - COMPLETE 4+ VIEW

[x knee ap right]
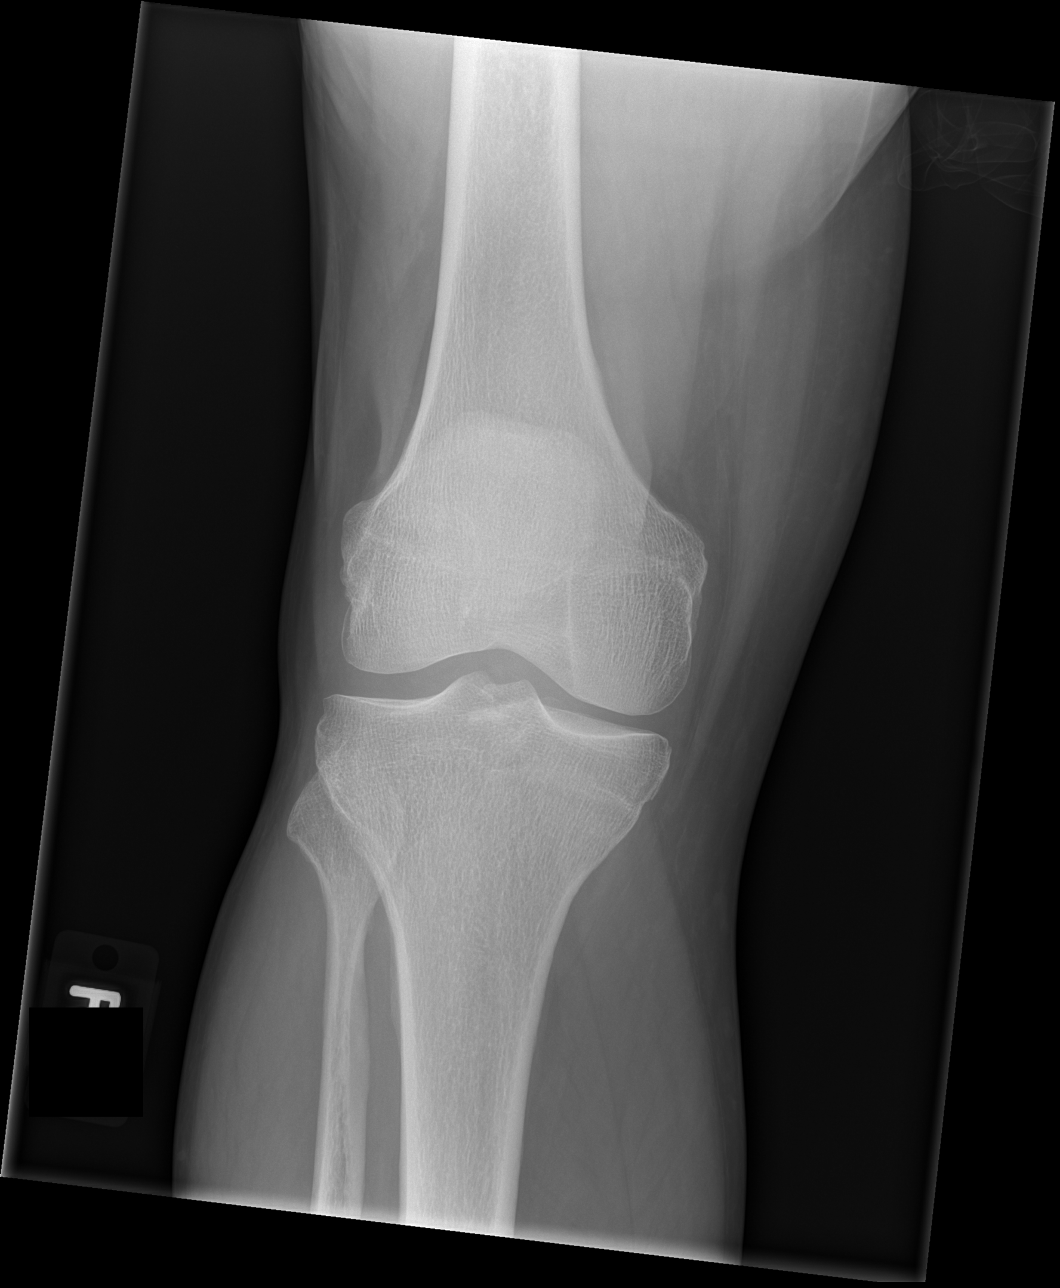

[x knee obl right (1 of 2)]
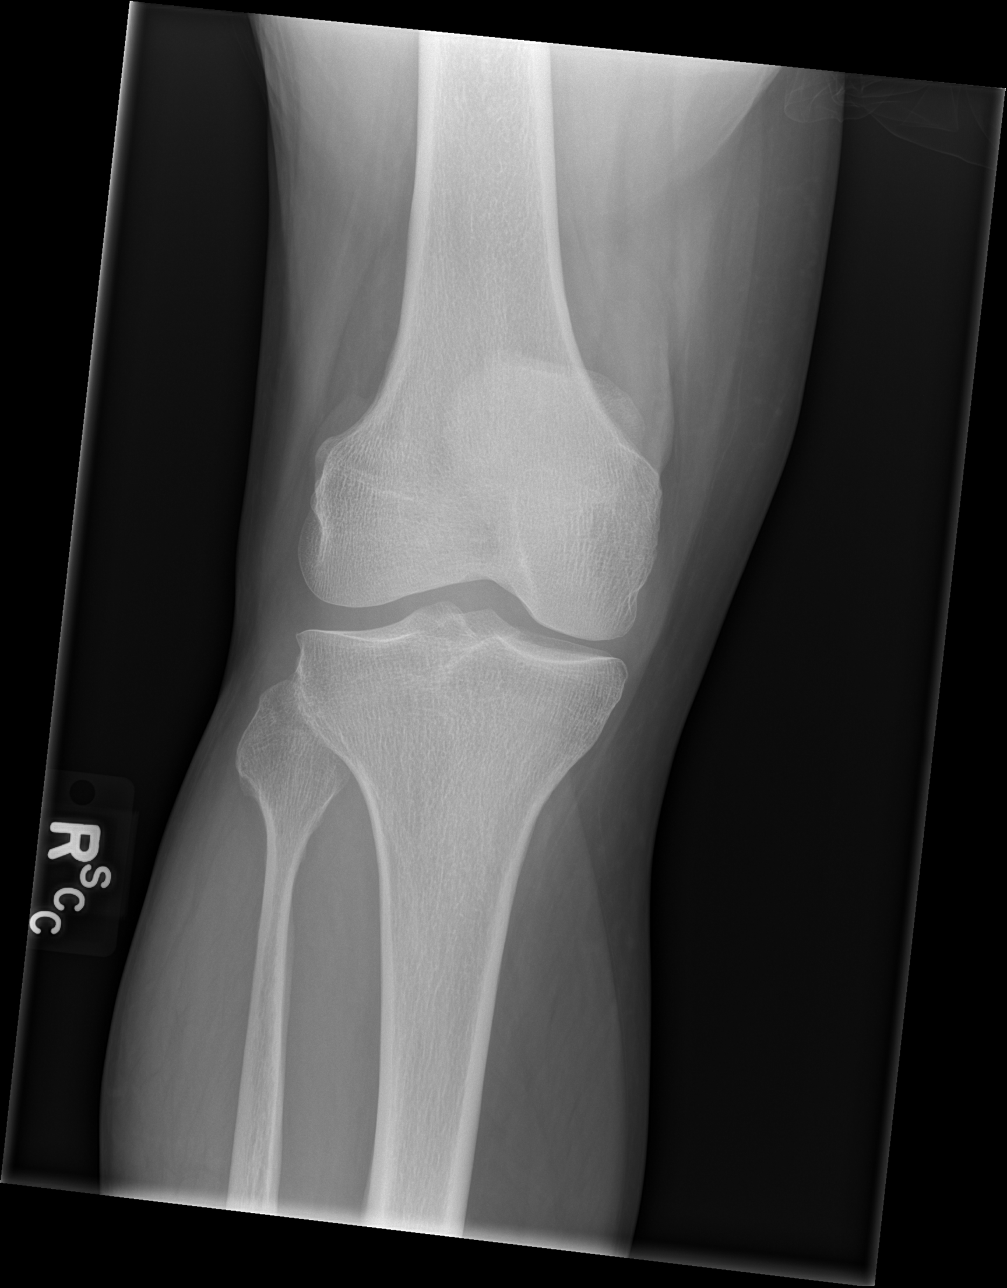

[x knee obl right (2 of 2)]
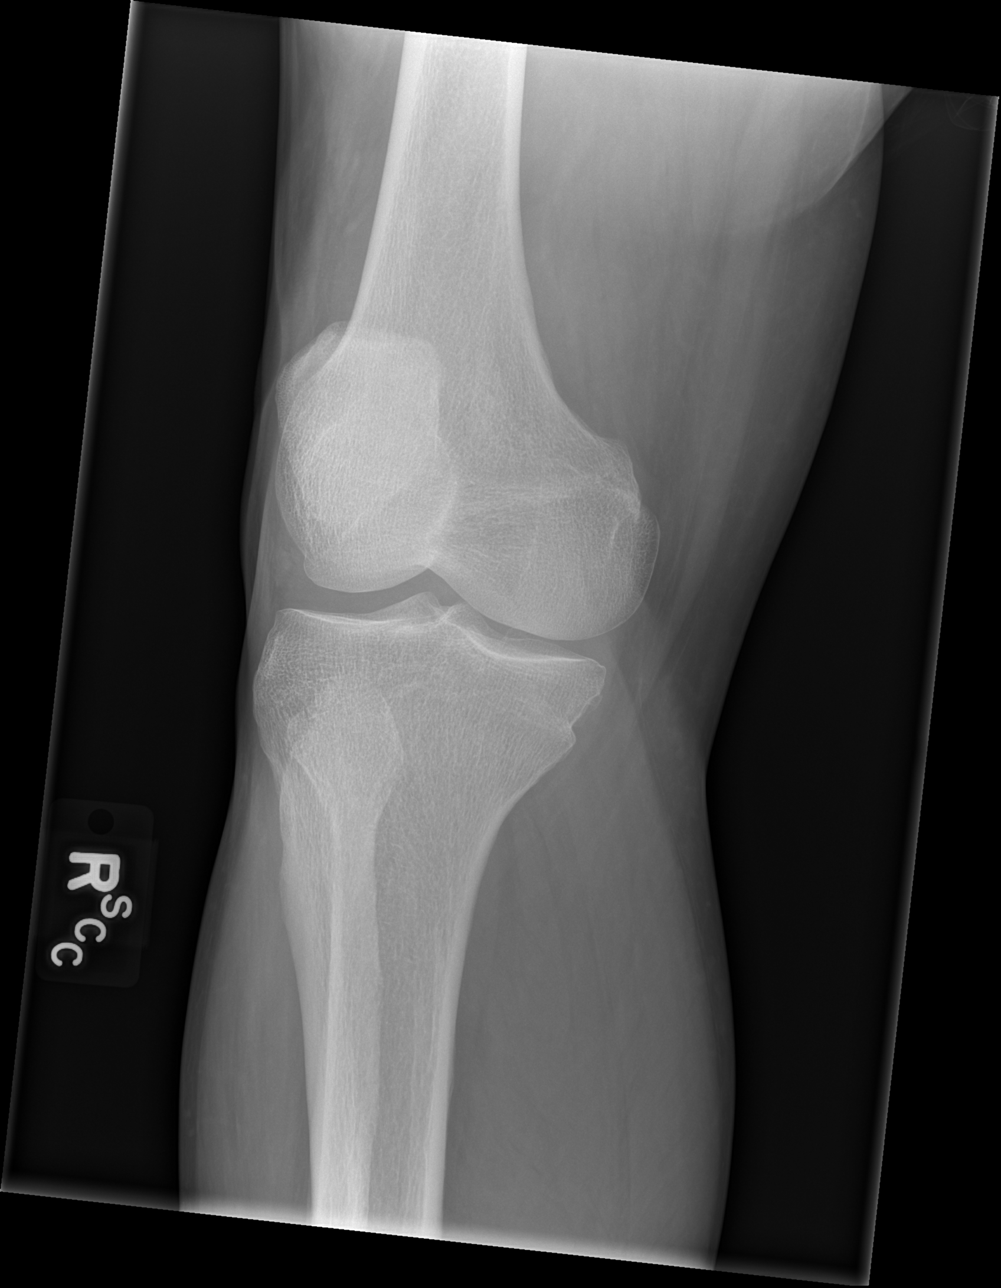

[x knee lat right]
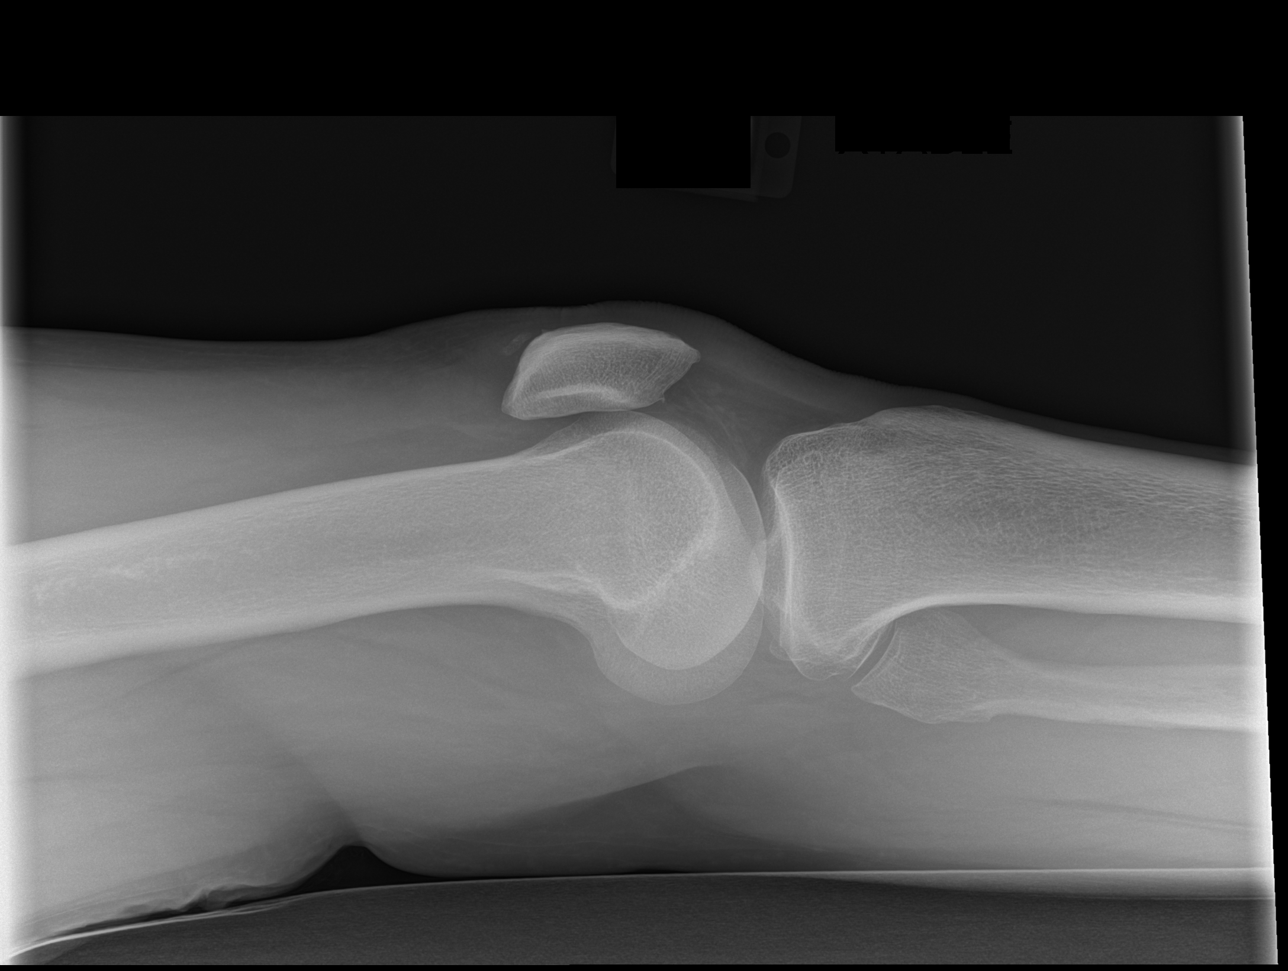

[4 of 4 positions shown; findings below may reference images not displayed]

FINDINGS: No evidence of fracture, dislocation, or joint effusion. No evidence
of arthropathy or other focal bone abnormality. Soft tissues are
unremarkable.
IMPRESSION: Negative.

## 2021-03-14 IMAGING — DX LEFT KNEE - COMPLETE 4+ VIEW
4 series · 4 of 4 positions shown · non-contrast
Comparison: None.

CLINICAL DATA: 72-year-old male status post fall onto knee with
pain and swelling.

EXAM:
LEFT KNEE - COMPLETE 4+ VIEW

[knee obl (1 of 2)]
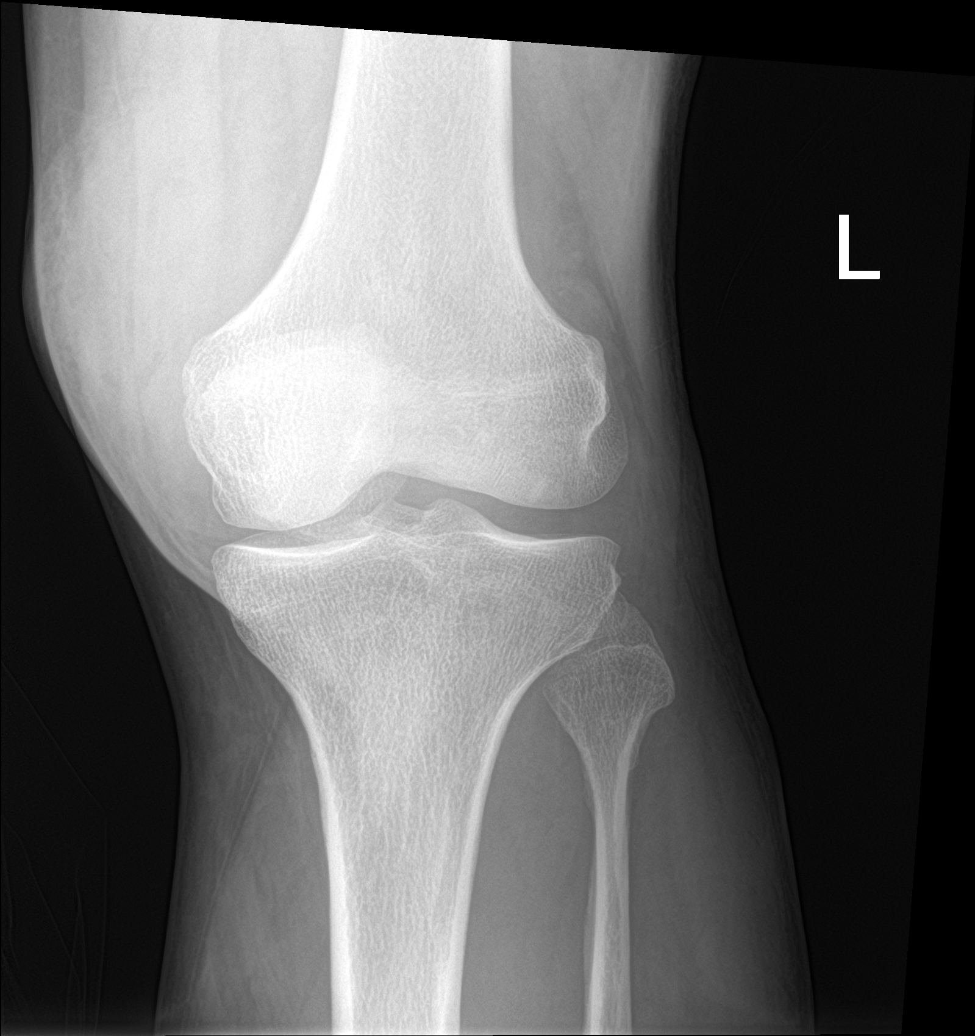

[knee lat]
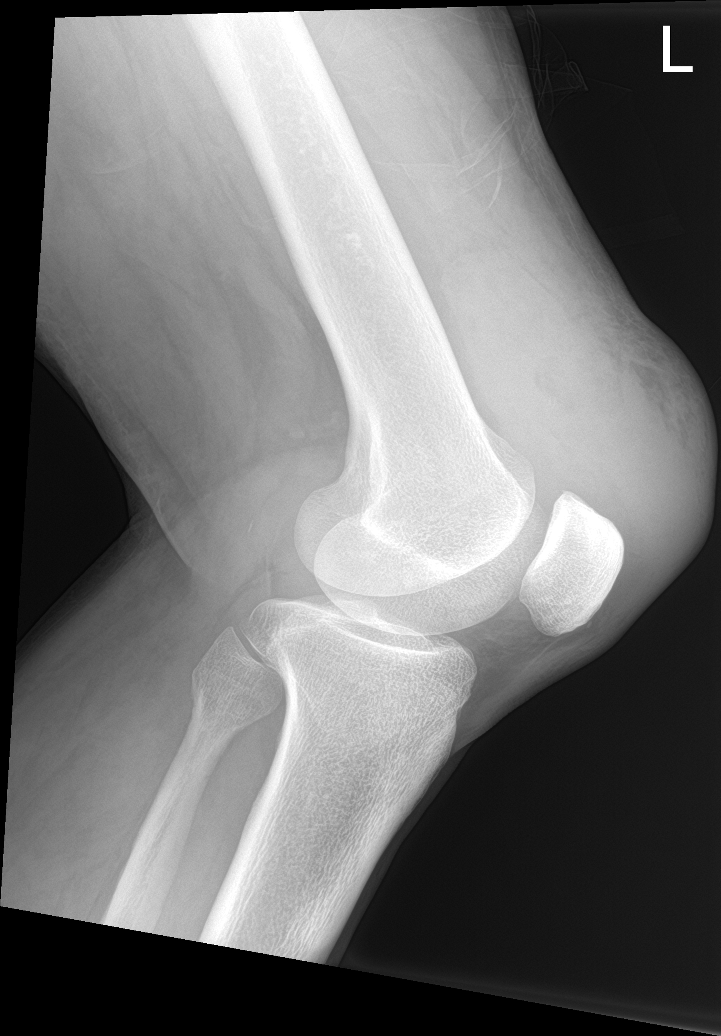

[knee obl (2 of 2)]
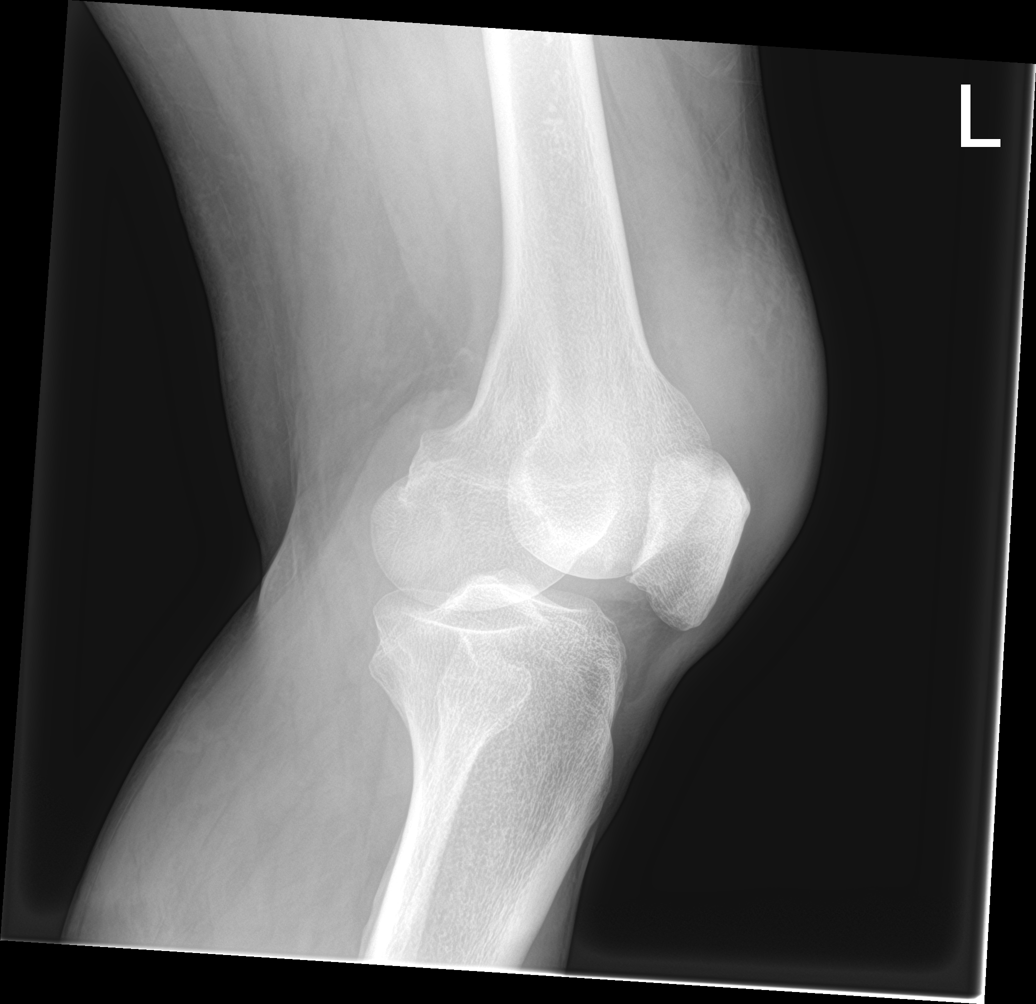

[knee ap]
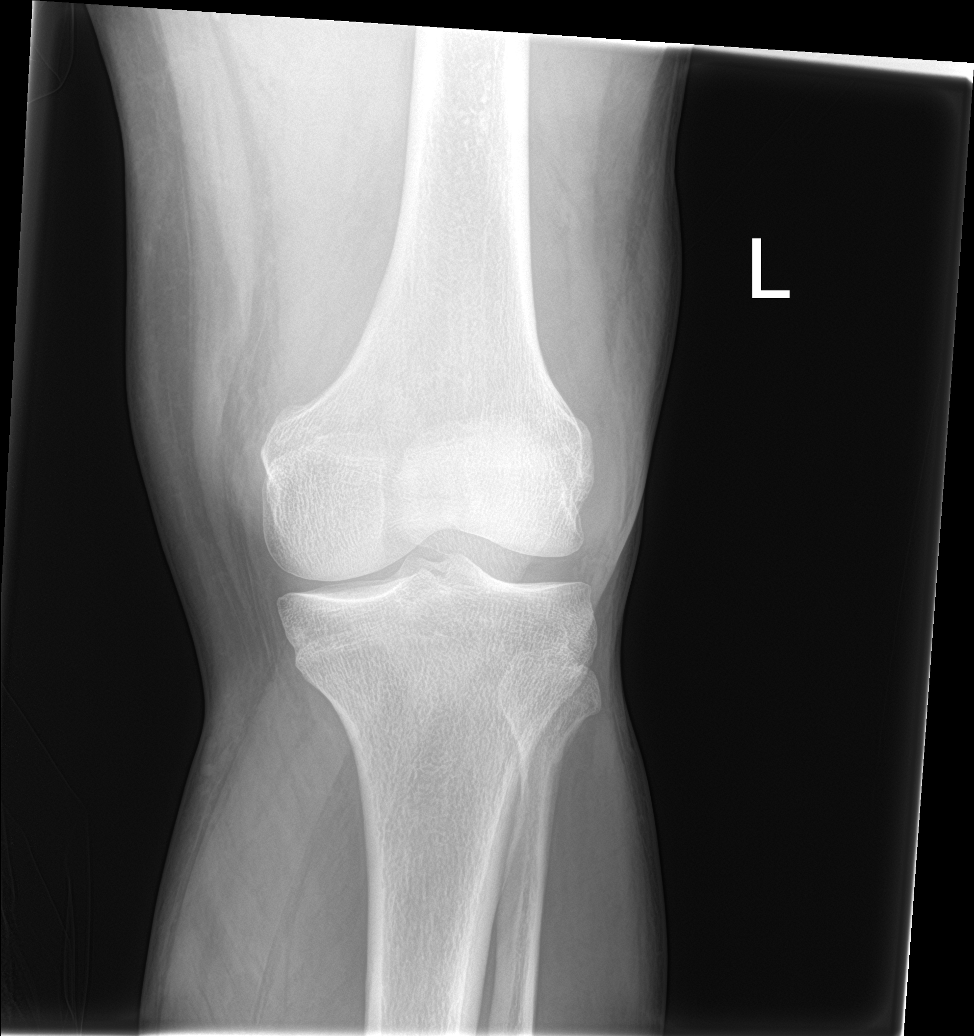

[4 of 4 positions shown; findings below may reference images not displayed]

FINDINGS: Bone mineralization is within normal limits. The patella appears to
remain intact although there is severe soft tissue swelling anterior
and superior to the patella. There is also evidence of a
suprapatellar joint effusion which may be hyperdense.

Joint spaces and alignment are preserved. No acute osseous
abnormality identified.
IMPRESSION: No fracture identified but large hematoma anterior and superior to
the patella and evidence of a joint effusion.

## 2021-03-14 IMAGING — DX PORTABLE CHEST - 1 VIEW
1 series · 1 of 1 positions shown · non-contrast
Comparison: November 30, 2014

CLINICAL DATA: Preop for knee surgery

EXAM:
PORTABLE CHEST 1 VIEW

[chest ap]
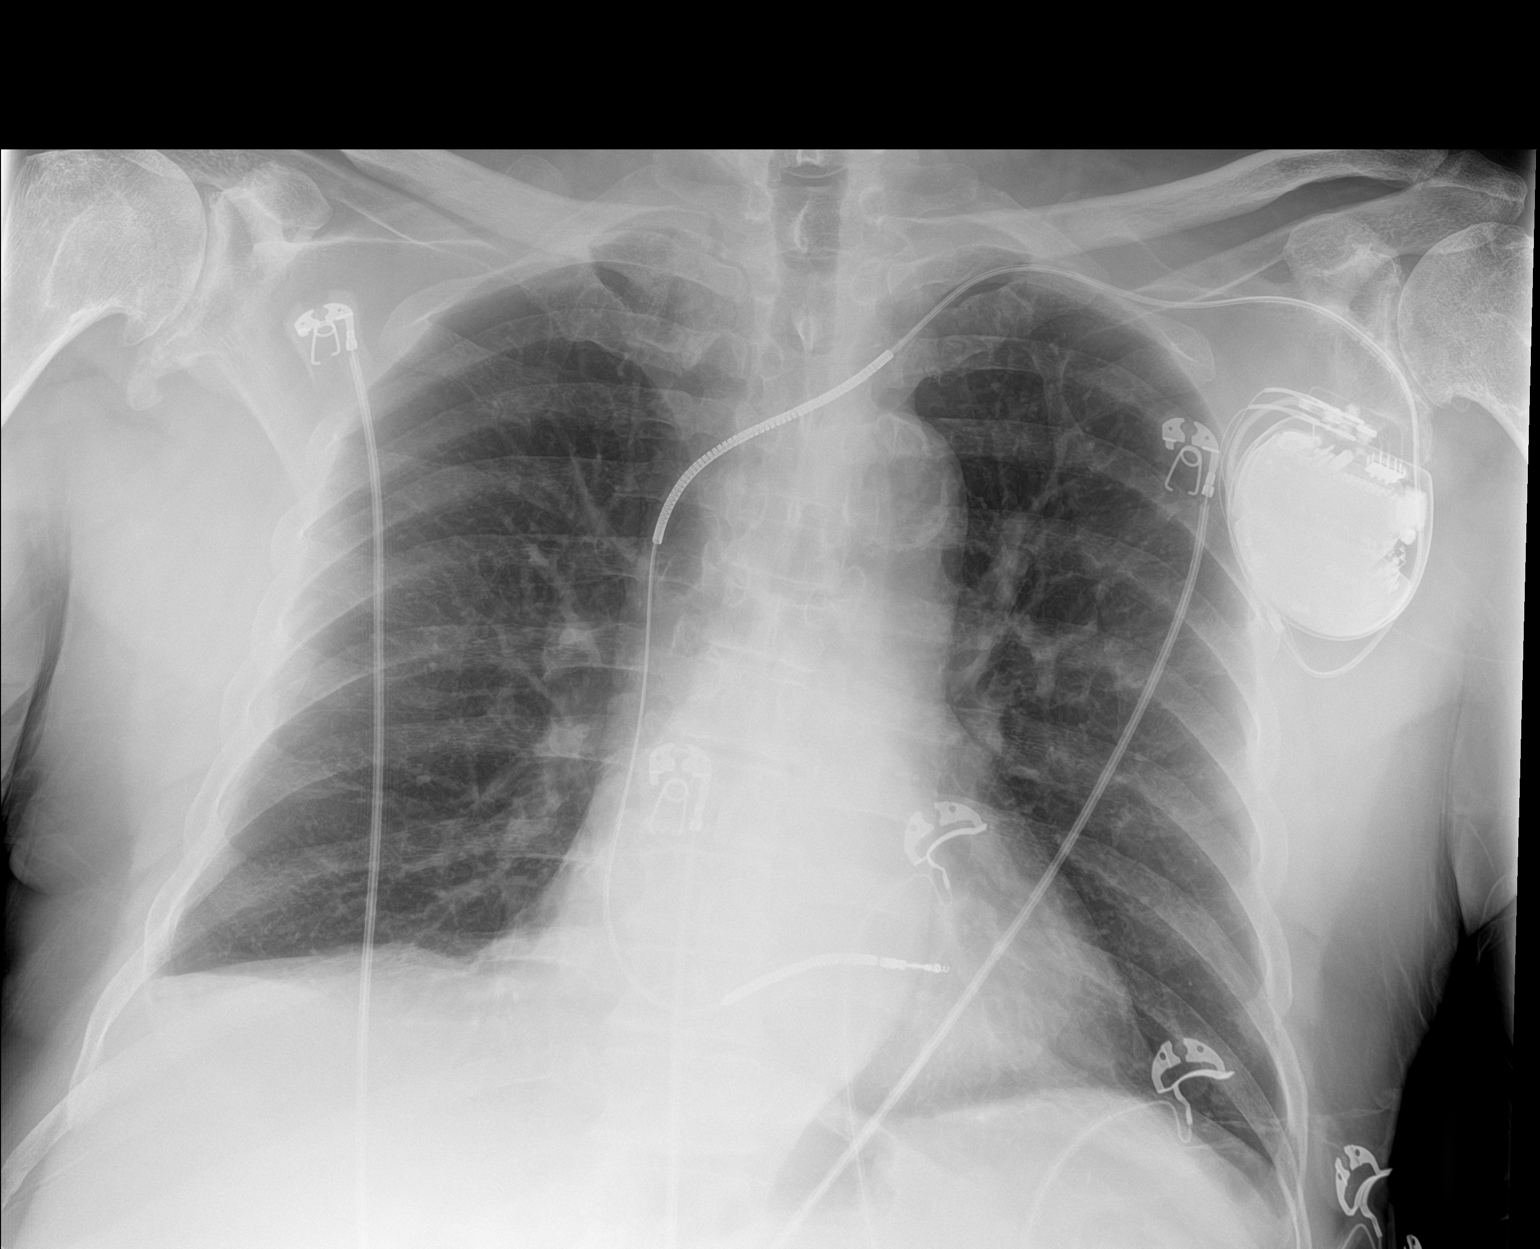

[1 of 1 positions shown; findings below may reference images not displayed]

FINDINGS: There is mild enlargement of the cardiac silhouette. A left-sided
pacemaker seen with the lead tip in the right ventricle. The lungs
are clear. No large airspace consolidation. No acute osseous
findings. Bilateral shoulder arthropathy is seen.
IMPRESSION: No acute cardiopulmonary process.

## 2021-04-25 ENCOUNTER — Other Ambulatory Visit: Payer: Self-pay

## 2021-04-25 ENCOUNTER — Ambulatory Visit (INDEPENDENT_AMBULATORY_CARE_PROVIDER_SITE_OTHER): Payer: Medicare Other | Admitting: Nurse Practitioner

## 2021-04-25 VITALS — BP 124/62 | HR 60 | Temp 97.6°F | Resp 14 | Ht 67.5 in | Wt 191.1 lb

## 2021-04-25 DIAGNOSIS — N481 Balanitis: Secondary | ICD-10-CM | POA: Diagnosis not present

## 2021-04-25 DIAGNOSIS — N4889 Other specified disorders of penis: Secondary | ICD-10-CM | POA: Diagnosis not present

## 2021-04-25 MED ORDER — NYSTATIN 100000 UNIT/GM EX CREA: 1.0000 "application " | TOPICAL_CREAM | Freq: Two times a day (BID) | CUTANEOUS | 0 refills | Status: DC

## 2021-04-25 NOTE — Patient Instructions (Signed)
Nice to see you today I will send in a cream that does not have a steroid in it to use for a week to see if it heals on up If it does not heal you need to reach out and let me know Follow up if symptoms fail to improve or worsen

## 2021-04-25 NOTE — Assessment & Plan Note (Signed)
1.  We will write some plain nystatin cream to place around the glans patient's been using combination cream of nystatin and triamcinolone.  Patient can do this for 1 week if no improvement is reach back out to the office.  He is currently on an SGLT2 curious if this is a contributing factor.  Did review proper hygiene and the retracting foreskin when urinating also while in shower retracting foreskin cleaning glans and skin well making sure dry before placing foreskin back and position.

## 2021-04-25 NOTE — Assessment & Plan Note (Signed)
Reviewed appropriate hygiene as above.

## 2021-04-25 NOTE — Progress Notes (Signed)
Acute Office Visit  Subjective:    Patient ID: Brad Brown, male    DOB: April 11, 1945, 76 y.o.   MRN: [id number]  Chief Complaint  Patient presents with   Rash    X 2 weeks on the penis area, not circumcised.    HPI Patient is in today for Rash on penis  Patients states that he noticed the rash approx 2 weeks States that he is not circumcised. States that it is inflamed. No touble peeig. States he has been using the nystatin type cream for two weeks and it has started to improve. He comes into wondering if he can get circumcised. He does have a history of diabetes and on an SGLT2 medication  Past Medical History:  Diagnosis Date   Acute myocardial infarction [facility]) 06/1983   Arthritis    Atrial fibrillation (HCC)    Automatic implantable cardiac defibrillator in situ    Borderline diabetes mellitus    BPH without obstruction/lower urinary tract symptoms    CAD (coronary artery disease)    Chronic kidney disease, stage 1    Colonic polyp 2004   hyperplastic    COPD (chronic obstructive pulmonary disease) (HCC)    Diabetes mellitus without complication ([place])    Fall    GERD (gastroesophageal reflux disease)    History of renal insufficiency syndrome    HTN (hypertension)    Hyperlipidemia    Knee derangement 09/2018   LEFT KNEE   Monoclonal gammopathy    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 Jude single chamber defibrillator   COLONOSCOPY  07/26/2019   EP IMPLANTABLE DEVICE N/A 02/21/2016   Procedure: ICD Generator Changeout;  Surgeon: Evans Lance, MD;  Location: [place] [facility];  Service: Cardiovascular;  Laterality: N/A;   INGUINAL HERNIA REPAIR Left    QUADRICEPS TENDON  REPAIR Left 10/20/2018   Procedure: REPAIR QUADRICEP TENDON;  Surgeon: Renette Butters, MD;  Location: [facility];  Service: Orthopedics;  Laterality: Left;    Family History  Problem Relation Age of Onset   Colon cancer Brother    Heart attack Mother    Heart attack Brother    Seizures Neg Hx     Social History   Socioeconomic History   Marital status: Single    Spouse name: Not on file   Number of children: 1   Years of education: Not on file   Highest education level: Not on file  Occupational History   Occupation: retired    Fish farm manager: RETIRED  Tobacco Use   Smoking status: Former    Packs/day: 0.50    Years: 15.00    Pack years: 7.50    Types: Cigarettes    Quit date: 03/23/2004    Years since quitting: 17.1   Smokeless tobacco: Never  Vaping Use   Vaping Use: Never used  Substance and Sexual Activity   Alcohol use: Yes    Alcohol/week: 1.0 standard drink    Types: 1 Glasses of wine per week    Comment: rare--wine   Drug use: No   Sexual activity: Not Currently  Other Topics Concern   Not on file  Social History  Narrative   ICD-St. Jude  Remote-Yes      Live currently @ [facility] for rehabilitation. Plans to discharge to his daughter's house for continuation of rehab.   Social Determinants of Health   Financial Resource Strain: Not on file  Food Insecurity: Not on file  Transportation Needs: Not on file  Physical Activity: Not on file  Stress: Not on file  Social Connections: Not on file  Intimate Partner Violence: Not on file    Outpatient Medications Prior to Visit  Medication Sig Dispense Refill   acetaminophen (TYLENOL) 325 MG tablet Take 650 mg by mouth 4 (four) times daily as needed.     albuterol (VENTOLIN HFA) 108 (90 Base) MCG/ACT inhaler Inhale 2 puffs into the lungs every 6 (six) hours as needed for wheezing or shortness of breath. 8 g 0   baclofen (LIORESAL) 10 MG tablet Take 1 tablet (10 mg total) by mouth 2 (two) times daily as needed for  muscle spasms. 20 each 0   blood glucose meter kit and supplies Check sugar twice daily. DX E11.22 1 each 12   budesonide-formoterol (SYMBICORT) 160-4.5 MCG/ACT inhaler Inhale 2 puffs into the lungs 2 (two) times daily. 10.2 g 1   Cholecalciferol 50 MCG (2000 UT) TABS Take 1 tablet by mouth daily.     ciclopirox (LOPROX) 0.77 % cream Apply 1 application topically 2 (two) times daily as needed (for rash).      Clotrimazole 1 % OINT Apply 1 application topically daily. 30 g 0   Continuous Blood Gluc Receiver (FREESTYLE LIBRE 14 DAY READER) DEVI 1 each by Does not apply route as directed. 2 each 11   dapagliflozin propanediol (FARXIGA) 10 MG TABS tablet Take 1 tablet (10 mg total) by mouth daily before breakfast. 90 tablet 1   glipiZIDE (GLUCOTROL) 10 MG tablet Take 1 tablet by mouth in the morning and at bedtime.     HYDROcodone-acetaminophen (NORCO) 5-325 MG tablet Take 1-2 tablets by mouth every 6 (six) hours as needed for severe pain (Take tylenol for mild and moderate pain). 30 tablet 0   levETIRAcetam (KEPPRA) 500 MG tablet 1 in the morning and 2 in the afternoon     losartan (COZAAR) 100 MG tablet Take 50 mg by mouth daily.     metFORMIN (GLUCOPHAGE) 1000 MG tablet Take 1 tablet (1,000 mg total) by mouth 2 (two) times daily with a meal. 180 tablet 3   metoprolol tartrate (LOPRESSOR) 25 MG tablet Take 12.5 mg by mouth 2 (two) times daily.     Multiple Vitamin (MULTIVITAMIN) tablet Take 1 tablet by mouth daily.     Niacin, Antihyperlipidemic, 500 MG TABS Take 500 mg by mouth daily. 30 tablet 2   nystatin-triamcinolone (MYCOLOG II) cream APPLY SMALL AMOUNT TO AFFECTED AREA TWICE A DAY     Omega-3 Fatty Acids (FISH OIL) 1200 MG CAPS Take 1,200 mg by mouth daily.      rivaroxaban (XARELTO) 20 MG TABS tablet Take 20 mg by mouth daily.      senna (SENOKOT) 8.6 MG TABS tablet Take 1 tablet (8.6 mg total) by mouth 2 (two) times daily. 120 tablet 0   simvastatin (ZOCOR) 20 MG tablet Take 10 mg by mouth  at bedtime.      tamsulosin (FLOMAX) 0.4 MG CAPS capsule Take 0.4 mg by mouth daily.  5   terbinafine (LAMISIL) 250 MG tablet Take 1 tablet (250 mg total) by mouth daily. 90 tablet 0   bisacodyl (DULCOLAX) 10  MG suppository Place 1 suppository (10 mg total) rectally daily as needed for moderate constipation. 12 suppository 0   empagliflozin (JARDIANCE) 25 MG TABS tablet Take 0.5 tablets by mouth every morning.     lactulose (CHRONULAC) 10 GM/15ML solution Take 20 g by mouth 2 (two) times daily as needed for mild constipation.     levETIRAcetam (KEPPRA) 500 MG tablet Take 500-1,000 mg by mouth See admin instructions. Take one tablet by mouth in the morning and two tablets by mouth at night     ondansetron (ZOFRAN) 4 MG tablet Take 1 tablet (4 mg total) by mouth every 6 (six) hours as needed for nausea. 20 tablet 0   polyethylene glycol (MIRALAX / GLYCOLAX) 17 g packet Take 17 g by mouth 2 (two) times daily. 14 each 0   No facility-administered medications prior to visit.    Allergies  Allergen Reactions   Ace Inhibitors    Lipitor [Atorvastatin] Rash    Review of Systems  Constitutional:  Negative for chills and fatigue.  Respiratory:  Negative for cough and shortness of breath.   Cardiovascular:  Negative for chest pain.  Gastrointestinal:  Negative for abdominal pain, diarrhea, nausea and vomiting.  Genitourinary:  Negative for penile discharge, penile pain, penile swelling, scrotal swelling and testicular pain.      Objective:    Physical Exam Vitals and nursing note reviewed. Exam conducted with a chaperone present [facility] RMA).  Constitutional:      Appearance: Normal appearance.  Cardiovascular:     Rate and Rhythm: Normal rate and regular rhythm.     Heart sounds: Normal heart sounds.  Pulmonary:     Effort: Pulmonary effort is normal.     Breath sounds: Normal breath sounds.  Abdominal:     General: Bowel sounds are normal. There is no distension.      Palpations: There is no mass.     Tenderness: There is no abdominal tenderness.     Hernia: A hernia (umbilical) is present. There is no hernia in the left inguinal area or right inguinal area.  Genitourinary:    Penis: Uncircumcised. Erythema present. No phimosis, paraphimosis, tenderness or discharge.      Testes: Normal.        Right: Mass, tenderness or swelling not present.        Left: Mass, tenderness or swelling not present.     Epididymis:     Right: Normal.     Left: Normal.       Comments: Redness/ irritation noticed where skin meets the prepuce  Rest of the glans WNL  Lymphadenopathy:     Lower Body: No right inguinal adenopathy. No left inguinal adenopathy.  Neurological:     Mental Status: He is alert.    There were no vitals taken for this visit. Wt Readings from Last 3 Encounters:  02/20/21 185 lb 4 oz (84 kg)  12/12/20 190 lb 7 oz (86.4 kg)  11/21/20 193 lb 12 oz (87.9 kg)    There are no preventive care reminders to display for this patient.  There are no preventive care reminders to display for this patient.   Lab Results  Component Value Date   TSH 2.62 09/12/2019   Lab Results  Component Value Date   WBC 6.3 02/20/2021   HGB 14.9 02/20/2021   HCT 45.2 02/20/2021   MCV 91.4 02/20/2021   PLT 180.0 02/20/2021   Lab Results  Component Value Date   NA 135 02/20/2021  K 4.0 02/20/2021   CHLORIDE 106 01/30/2016   CO2 23 02/20/2021   GLUCOSE 83 02/20/2021   BUN 16 02/20/2021   CREATININE 1.47 02/20/2021   BILITOT 0.8 03/11/2021   ALKPHOS 55 03/11/2021   AST 19 03/11/2021   ALT 23 03/11/2021   PROT 7.1 03/11/2021   ALBUMIN 4.5 03/11/2021   CALCIUM 9.9 02/20/2021   ANIONGAP 11 01/23/2021   EGFR 61 (L) 01/30/2016   GFR 47.15 (L) 02/20/2021   Lab Results  Component Value Date   CHOL 142 02/20/2021   Lab Results  Component Value Date   HDL 49.00 02/20/2021   Lab Results  Component Value Date   LDLCALC 47 04/21/2018   Lab Results   Component Value Date   TRIG 275.0 (H) 02/20/2021   Lab Results  Component Value Date   CHOLHDL 3 02/20/2021   Lab Results  Component Value Date   HGBA1C 7.3 (A) 02/20/2021       Assessment & Plan:   Problem List Items Addressed This Visit       Genitourinary   Balanitis - Primary    1.  We will write some plain nystatin cream to place around the glans patient's been using combination cream of nystatin and triamcinolone.  Patient can do this for 1 week if no improvement is reach back out to the office.  He is currently on an SGLT2 curious if this is a contributing factor.  Did review proper hygiene and the retracting foreskin when urinating also while in shower retracting foreskin cleaning glans and skin well making sure dry before placing foreskin back and position.      Relevant Medications   nystatin cream (MYCOSTATIN)     Other   Penile irritation    Reviewed appropriate hygiene as above.      Relevant Medications   nystatin cream (MYCOSTATIN)     Meds ordered this encounter  Medications   nystatin cream (MYCOSTATIN)    Sig: Apply 1 application topically 2 (two) times daily.    Dispense:  30 g    Refill:  0    Order Specific Question:   Supervising Provider    Answer:   Loura Pardon A [[id number]]   This visit occurred during the SARS-CoV-2 public health emergency.  Safety protocols were in place, including screening questions prior to the visit, additional usage of staff PPE, and extensive cleaning of exam room while observing appropriate contact time as indicated for disinfecting solutions.   Romilda Garret, NP

## 2021-05-15 ENCOUNTER — Other Ambulatory Visit: Payer: Self-pay | Admitting: Podiatry

## 2021-05-22 ENCOUNTER — Other Ambulatory Visit: Payer: Self-pay | Admitting: Nurse Practitioner

## 2021-05-22 DIAGNOSIS — N181 Chronic kidney disease, stage 1: Secondary | ICD-10-CM

## 2021-05-22 DIAGNOSIS — E1122 Type 2 diabetes mellitus with diabetic chronic kidney disease: Secondary | ICD-10-CM

## 2021-05-28 ENCOUNTER — Ambulatory Visit (INDEPENDENT_AMBULATORY_CARE_PROVIDER_SITE_OTHER): Payer: Medicare Other

## 2021-05-28 DIAGNOSIS — I255 Ischemic cardiomyopathy: Secondary | ICD-10-CM

## 2021-05-30 LAB — CUP PACEART REMOTE DEVICE CHECK
Battery Remaining Longevity: 56 mo
Battery Remaining Percentage: 54 %
Battery Voltage: 2.93 V
Brady Statistic RV Percent Paced: 2.2 %
Date Time Interrogation Session: 20230309104013
HighPow Impedance: 52 Ohm
HighPow Impedance: 56 Ohm
Implantable Lead Implant Date: 20080527
Implantable Lead Location: 753860
Implantable Lead Model: 7121
Implantable Pulse Generator Implant Date: 20171201
Lead Channel Impedance Value: 490 Ohm
Lead Channel Pacing Threshold Amplitude: 1 V
Lead Channel Pacing Threshold Pulse Width: 0.5 ms
Lead Channel Sensing Intrinsic Amplitude: 11.8 mV
Lead Channel Setting Pacing Amplitude: 2.5 V
Lead Channel Setting Pacing Pulse Width: 0.5 ms
Lead Channel Setting Sensing Sensitivity: 0.5 mV
Pulse Gen Serial Number: 7283394

## 2021-06-10 NOTE — Progress Notes (Signed)
Remote ICD transmission.   

## 2021-07-10 ENCOUNTER — Ambulatory Visit: Payer: Medicare Other | Admitting: Podiatry

## 2021-07-10 DIAGNOSIS — B351 Tinea unguium: Secondary | ICD-10-CM | POA: Diagnosis not present

## 2021-07-10 DIAGNOSIS — Z79899 Other long term (current) drug therapy: Secondary | ICD-10-CM

## 2021-07-10 NOTE — Progress Notes (Signed)
?Subjective:  ?Patient ID: Brad Brown, male    DOB: 1946-01-28,  MRN: [id number] ? ?Chief Complaint  ?Patient presents with  ? Nail Problem  ?  Nail fungus follow up   ? ? ?76 y.o. male presents with the above complaint.  Patient presents with follow-up to left third digit onychomycosis.  He states he is doing a lot better.  He states the Lamisil has helped.  He was able to tolerate the medication.  He has about 50% improvement of nail.  He would like to continue Lamisil therapy ? ? ?Review of Systems: Negative except as noted in the HPI. Denies N/V/F/Ch. ? ?Past Medical History:  ?Diagnosis Date  ? Acute myocardial infarction [facility]) 06/1983  ? Arthritis   ? Atrial fibrillation ([place])   ? Automatic implantable cardiac defibrillator in situ   ? Borderline diabetes mellitus   ? BPH without obstruction/lower urinary tract symptoms   ? CAD (coronary artery disease)   ? Chronic kidney disease, stage 1   ? Colonic polyp 2004  ? hyperplastic   ? COPD (chronic obstructive pulmonary disease) ([place])   ? Diabetes mellitus without complication ([place])   ? Fall   ? GERD (gastroesophageal reflux disease)   ? History of renal insufficiency syndrome   ? HTN (hypertension)   ? Hyperlipidemia   ? Knee derangement 09/2018  ? LEFT KNEE  ? Monoclonal gammopathy   ? 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 Seizure disorder ([place])   ? Stroke [facility])   ? pt reports he has had 3  ? ? ?Current Outpatient Medications:  ?  acetaminophen (TYLENOL) 325 MG tablet, Take 650 mg by mouth 4 (four) times daily as needed., Disp: , Rfl:  ?  albuterol (VENTOLIN HFA) 108 (90 Base) MCG/ACT inhaler, Inhale 2 puffs into the lungs every 6 (six) hours as needed for wheezing or shortness of breath., Disp: 8 g, Rfl: 0 ?  baclofen (LIORESAL) 10 MG tablet, Take 1 tablet (10 mg total) by mouth 2 (two) times daily as needed for muscle spasms., Disp: 20 each, Rfl: 0 ?  blood glucose meter kit and supplies, Check sugar twice daily. DX E11.22, Disp: 1  each, Rfl: 12 ?  budesonide-formoterol (SYMBICORT) 160-4.5 MCG/ACT inhaler, Inhale 2 puffs into the lungs 2 (two) times daily., Disp: 10.2 g, Rfl: 1 ?  Cholecalciferol 50 MCG (2000 UT) TABS, Take 1 tablet by mouth daily., Disp: , Rfl:  ?  ciclopirox (LOPROX) 0.77 % cream, Apply 1 application topically 2 (two) times daily as needed (for rash). , Disp: , Rfl:  ?  Clotrimazole 1 % OINT, Apply 1 application topically daily., Disp: 30 g, Rfl: 0 ?  Continuous Blood Gluc Receiver (FREESTYLE LIBRE 14 DAY READER) DEVI, 1 each by Does not apply route as directed., Disp: 2 each, Rfl: 11 ?  FARXIGA 10 MG TABS tablet, TAKE 1 TABLET BY MOUTH DAILY BEFORE BREAKFAST., Disp: 90 tablet, Rfl: 1 ?  glipiZIDE (GLUCOTROL) 10 MG tablet, Take 1 tablet by mouth in the morning and at bedtime., Disp: , Rfl:  ?  HYDROcodone-acetaminophen (NORCO) 5-325 MG tablet, Take 1-2 tablets by mouth every 6 (six) hours as needed for severe pain (Take tylenol for mild and moderate pain)., Disp: 30 tablet, Rfl: 0 ?  levETIRAcetam (KEPPRA) 500 MG tablet, 1 in the morning and 2 in the afternoon, Disp: , Rfl:  ?  losartan (COZAAR) 100 MG tablet, Take 50 mg by mouth daily., Disp: , Rfl:  ?  metFORMIN (GLUCOPHAGE) 1000 MG tablet, Take 1 tablet (1,000 mg total) by mouth 2 (two) times daily with a meal., Disp: 180 tablet, Rfl: 3 ?  metoprolol tartrate (LOPRESSOR) 25 MG tablet, Take 12.5 mg by mouth 2 (two) times daily., Disp: , Rfl:  ?  Multiple Vitamin (MULTIVITAMIN) tablet, Take 1 tablet by mouth daily., Disp: , Rfl:  ?  Niacin, Antihyperlipidemic, 500 MG TABS, Take 500 mg by mouth daily., Disp: 30 tablet, Rfl: 2 ?  nystatin cream (MYCOSTATIN), Apply 1 application topically 2 (two) times daily., Disp: 30 g, Rfl: 0 ?  nystatin-triamcinolone (MYCOLOG II) cream, APPLY SMALL AMOUNT TO AFFECTED AREA TWICE A DAY, Disp: , Rfl:  ?  Omega-3 Fatty Acids (FISH OIL) 1200 MG CAPS, Take 1,200 mg by mouth daily. , Disp: , Rfl:  ?  rivaroxaban (XARELTO) 20 MG TABS tablet, Take  20 mg by mouth daily. , Disp: , Rfl:  ?  senna (SENOKOT) 8.6 MG TABS tablet, Take 1 tablet (8.6 mg total) by mouth 2 (two) times daily., Disp: 120 tablet, Rfl: 0 ?  simvastatin (ZOCOR) 20 MG tablet, Take 10 mg by mouth at bedtime. , Disp: , Rfl:  ?  tamsulosin (FLOMAX) 0.4 MG CAPS capsule, Take 0.4 mg by mouth daily., Disp: , Rfl: 5 ?  terbinafine (LAMISIL) 250 MG tablet, Take 1 tablet (250 mg total) by mouth daily., Disp: 90 tablet, Rfl: 0 ? ?Social History  ? ?Tobacco Use  ?Smoking Status Former  ? Packs/day: 0.50  ? Years: 15.00  ? Pack years: 7.50  ? Types: Cigarettes  ? Quit date: 03/23/2004  ? Years since quitting: 17.3  ?Smokeless Tobacco Never  ? ? ?Allergies  ?Allergen Reactions  ? Ace Inhibitors   ? Lipitor [Atorvastatin] Rash  ? ?Objective:  ?There were no vitals filed for this visit. ?There is no height or weight on file to calculate BMI. ?Constitutional Well developed. ?Well nourished.  ?Vascular Dorsalis pedis pulses palpable bilaterally. ?Posterior tibial pulses palpable bilaterally. ?Capillary refill normal to all digits.  ?No cyanosis or clubbing noted. ?Pedal hair growth normal.  ?Neurologic Normal speech. ?Oriented to person, place, and time. ?Epicritic sensation to light touch grossly present bilaterally.  ?Dermatologic Nails left third digit onychomycosis thickened elongated dystrophic mycotic nail.  Mild pain on palpation.  Improving ?Skin within normal limits  ?Orthopedic: Normal joint ROM without pain or crepitus bilaterally. ?No visible deformities. ?No bony tenderness.  ? ?Radiographs: None ?Assessment:  ? ?1. Long-term use of high-risk medication   ?2. Nail fungus   ?3. Onychomycosis due to dermatophyte   ? ?Plan:  ?Patient was evaluated and treated and all questions answered. ? ?Left third digit onychomycosis~second round ?-Educated the patient on the etiology of onychomycosis and various treatment options associated with improving the fungal load.  I explained to the patient that there is  3 treatment options available to treat the onychomycosis including topical, p.o., laser treatment.  Patient elected to undergo second round of p.o. options with Lamisil/terbinafine therapy.  In order for me to start the medication therapy, I explained to the patient the importance of evaluating the liver and obtaining the liver function test.  Once the liver function test comes back normal I will start him on second round of 28-monthcourse of Lamisil therapy.  Patient understood all risk and would like to proceed with Lamisil therapy.  I have asked the patient to immediately stop the Lamisil therapy if she has any reactions to it and call the office or go  to the emergency room right away.  Patient states understanding ? ? ?No follow-ups on file. ?

## 2021-07-11 LAB — HEPATIC FUNCTION PANEL
ALT: 22 IU/L (ref 0–44)
AST: 18 IU/L (ref 0–40)
Albumin: 4.4 g/dL (ref 3.7–4.7)
Alkaline Phosphatase: 74 IU/L (ref 44–121)
Bilirubin Total: 0.9 mg/dL (ref 0.0–1.2)
Bilirubin, Direct: 0.24 mg/dL (ref 0.00–0.40)
Total Protein: 7.1 g/dL (ref 6.0–8.5)

## 2021-07-11 MED ORDER — TERBINAFINE HCL 250 MG PO TABS: 250.0000 mg | ORAL_TABLET | Freq: Every day | ORAL | 0 refills | Status: DC

## 2021-07-11 NOTE — Addendum Note (Signed)
Addended by: Boneta Lucks on: 07/11/2021 09:25 AM ? ? Modules accepted: Orders ? ?

## 2021-07-14 ENCOUNTER — Emergency Department (HOSPITAL_COMMUNITY)
Admission: EM | Admit: 2021-07-14 | Discharge: 2021-07-14 | Disposition: A | Discharge status: Home / Self Care | Payer: Medicare Other | Attending: Emergency Medicine | Admitting: Emergency Medicine

## 2021-07-14 ENCOUNTER — Encounter (HOSPITAL_COMMUNITY): Payer: Self-pay

## 2021-07-14 ENCOUNTER — Inpatient Hospital Stay: Payer: Medicare Other | Attending: Oncology

## 2021-07-14 ENCOUNTER — Telehealth: Payer: Self-pay

## 2021-07-14 ENCOUNTER — Other Ambulatory Visit: Payer: Self-pay

## 2021-07-14 DIAGNOSIS — E1165 Type 2 diabetes mellitus with hyperglycemia: Secondary | ICD-10-CM | POA: Insufficient documentation

## 2021-07-14 DIAGNOSIS — E871 Hypo-osmolality and hyponatremia: Secondary | ICD-10-CM | POA: Insufficient documentation

## 2021-07-14 DIAGNOSIS — Z7984 Long term (current) use of oral hypoglycemic drugs: Secondary | ICD-10-CM | POA: Insufficient documentation

## 2021-07-14 DIAGNOSIS — R739 Hyperglycemia, unspecified: Secondary | ICD-10-CM | POA: Diagnosis present

## 2021-07-14 DIAGNOSIS — Z7901 Long term (current) use of anticoagulants: Secondary | ICD-10-CM | POA: Diagnosis not present

## 2021-07-14 DIAGNOSIS — I10 Essential (primary) hypertension: Secondary | ICD-10-CM | POA: Diagnosis not present

## 2021-07-14 DIAGNOSIS — Z79899 Other long term (current) drug therapy: Secondary | ICD-10-CM | POA: Diagnosis not present

## 2021-07-14 DIAGNOSIS — Z794 Long term (current) use of insulin: Secondary | ICD-10-CM | POA: Diagnosis not present

## 2021-07-14 DIAGNOSIS — Z760 Encounter for issue of repeat prescription: Secondary | ICD-10-CM | POA: Diagnosis not present

## 2021-07-14 DIAGNOSIS — D472 Monoclonal gammopathy: Secondary | ICD-10-CM

## 2021-07-14 LAB — COMPREHENSIVE METABOLIC PANEL
ALT: 17 U/L (ref 0–44)
AST: 19 U/L (ref 15–41)
Albumin: 3.5 g/dL (ref 3.5–5.0)
Alkaline Phosphatase: 64 U/L (ref 38–126)
Anion gap: 12 (ref 5–15)
BUN: 16 mg/dL (ref 8–23)
CO2: 21 mmol/L — ABNORMAL LOW (ref 22–32)
Calcium: 8.9 mg/dL (ref 8.9–10.3)
Chloride: 95 mmol/L — ABNORMAL LOW (ref 98–111)
Creatinine, Ser: 1.34 mg/dL — ABNORMAL HIGH (ref 0.61–1.24)
GFR, Estimated: 56 mL/min — ABNORMAL LOW (ref >60)
Glucose, Bld: 262 mg/dL — ABNORMAL HIGH (ref 70–99)
Potassium: 4.2 mmol/L (ref 3.5–5.1)
Sodium: 128 mmol/L — ABNORMAL LOW (ref 135–145)
Total Bilirubin: 0.8 mg/dL (ref 0.3–1.2)
Total Protein: 7 g/dL (ref 6.5–8.1)

## 2021-07-14 LAB — I-STAT VENOUS BLOOD GAS, ED
Acid-base deficit: 1 mmol/L (ref 0.0–2.0)
Bicarbonate: 22.1 mmol/L (ref 20.0–28.0)
Calcium, Ion: 1.06 mmol/L — ABNORMAL LOW (ref 1.15–1.40)
HCT: 42 % (ref 39.0–52.0)
Hemoglobin: 14.3 g/dL (ref 13.0–17.0)
O2 Saturation: 87 %
Potassium: 4.1 mmol/L (ref 3.5–5.1)
Sodium: 127 mmol/L — ABNORMAL LOW (ref 135–145)
TCO2: 23 mmol/L (ref 22–32)
pCO2, Ven: 31.9 mmHg — ABNORMAL LOW (ref 44–60)
pH, Ven: 7.449 — ABNORMAL HIGH (ref 7.25–7.43)
pO2, Ven: 49 mmHg — ABNORMAL HIGH (ref 32–45)

## 2021-07-14 LAB — CBC WITH DIFFERENTIAL (CANCER CENTER ONLY)
Abs Immature Granulocytes: 0.03 10*3/uL (ref 0.00–0.07)
Basophils Absolute: 0 10*3/uL (ref 0.0–0.1)
Basophils Relative: 1 %
Eosinophils Absolute: 0 10*3/uL (ref 0.0–0.5)
Eosinophils Relative: 1 %
HCT: 41.3 % (ref 39.0–52.0)
Hemoglobin: 13.8 g/dL (ref 13.0–17.0)
Immature Granulocytes: 1 %
Lymphocytes Relative: 12 %
Lymphs Abs: 0.8 10*3/uL (ref 0.7–4.0)
MCH: 30.2 pg (ref 26.0–34.0)
MCHC: 33.4 g/dL (ref 30.0–36.0)
MCV: 90.4 fL (ref 80.0–100.0)
Monocytes Absolute: 0.6 10*3/uL (ref 0.1–1.0)
Monocytes Relative: 9 %
Neutro Abs: 5 10*3/uL (ref 1.7–7.7)
Neutrophils Relative %: 76 %
Platelet Count: 178 10*3/uL (ref 150–400)
RBC: 4.57 MIL/uL (ref 4.22–5.81)
RDW: 12.1 % (ref 11.5–15.5)
WBC Count: 6.4 10*3/uL (ref 4.0–10.5)
nRBC: 0 % (ref 0.0–0.2)

## 2021-07-14 LAB — CBC WITH DIFFERENTIAL/PLATELET
Abs Immature Granulocytes: 0.03 10*3/uL (ref 0.00–0.07)
Basophils Absolute: 0 10*3/uL (ref 0.0–0.1)
Basophils Relative: 1 %
Eosinophils Absolute: 0.1 10*3/uL (ref 0.0–0.5)
Eosinophils Relative: 1 %
HCT: 41.2 % (ref 39.0–52.0)
Hemoglobin: 13.9 g/dL (ref 13.0–17.0)
Immature Granulocytes: 0 %
Lymphocytes Relative: 16 %
Lymphs Abs: 1.2 10*3/uL (ref 0.7–4.0)
MCH: 30.8 pg (ref 26.0–34.0)
MCHC: 33.7 g/dL (ref 30.0–36.0)
MCV: 91.2 fL (ref 80.0–100.0)
Monocytes Absolute: 0.7 10*3/uL (ref 0.1–1.0)
Monocytes Relative: 10 %
Neutro Abs: 5.5 10*3/uL (ref 1.7–7.7)
Neutrophils Relative %: 72 %
Platelets: 181 10*3/uL (ref 150–400)
RBC: 4.52 MIL/uL (ref 4.22–5.81)
RDW: 11.9 % (ref 11.5–15.5)
WBC: 7.5 10*3/uL (ref 4.0–10.5)
nRBC: 0 % (ref 0.0–0.2)

## 2021-07-14 LAB — CMP (CANCER CENTER ONLY)
ALT: 13 U/L (ref 0–44)
AST: 9 U/L — ABNORMAL LOW (ref 15–41)
Albumin: 3.9 g/dL (ref 3.5–5.0)
Alkaline Phosphatase: 67 U/L (ref 38–126)
Anion gap: 7 (ref 5–15)
BUN: 18 mg/dL (ref 8–23)
CO2: 26 mmol/L (ref 22–32)
Calcium: 9 mg/dL (ref 8.9–10.3)
Chloride: 93 mmol/L — ABNORMAL LOW (ref 98–111)
Creatinine: 1.51 mg/dL — ABNORMAL HIGH (ref 0.61–1.24)
GFR, Estimated: 48 mL/min — ABNORMAL LOW (ref >60)
Glucose, Bld: 546 mg/dL (ref 70–99)
Potassium: 4.9 mmol/L (ref 3.5–5.1)
Sodium: 126 mmol/L — ABNORMAL LOW (ref 135–145)
Total Bilirubin: 0.9 mg/dL (ref 0.3–1.2)
Total Protein: 7.3 g/dL (ref 6.5–8.1)

## 2021-07-14 LAB — OSMOLALITY: Osmolality: 280 mOsm/kg (ref 275–295)

## 2021-07-14 LAB — CBG MONITORING, ED
Glucose-Capillary: 243 mg/dL — ABNORMAL HIGH (ref 70–99)
Glucose-Capillary: 203 mg/dL — ABNORMAL HIGH (ref 70–99)

## 2021-07-14 LAB — BETA-HYDROXYBUTYRIC ACID: Beta-Hydroxybutyric Acid: 0.14 mmol/L (ref 0.05–0.27)

## 2021-07-14 MED ORDER — GLIPIZIDE 10 MG PO TABS: 10.0000 mg | ORAL_TABLET | Freq: Two times a day (BID) | ORAL | 0 refills | Status: DC

## 2021-07-14 NOTE — Discharge Instructions (Addendum)
Please pick up medication and take as prescribed. Continue taking your Metformin and Farxiga as prescribed as well.  ? ?It is very important that you check your blood sugar levels regularly.  ? ?Follow up with your PCP tomorrow for further evaluation. You should have your sodium levels rechecked in 1-2 weeks as they were slightly low today. Drink plenty of water to stay hydrated.  ? ?Return to the ED for any new/worsening symptoms  ? ? ?

## 2021-07-14 NOTE — ED Provider Triage Note (Signed)
Emergency Medicine Provider Triage Evaluation Note ? ?Myrle Sheng , a 76 y.o. male  was evaluated in triage.  Pt complains of hyperglycemia.  Checked his blood sugar earlier today and was elevated in the 500s.  Patient went to his primary care doctor and had rechecked or found to be elevated in the 300s.  Patient has been taking his [place] medication as prescribed however is currently out of his glipizide and does not take his metformin on a regular basis. ? ?Review of Systems  ?Positive: Hyperglycemia ?Negative: Fever, chills, abdominal pain, nausea, vomiting, diarrhea, dysuria, hematuria, urinary urgency ? ?Physical Exam  ?BP (!) 163/100   Pulse 73   Temp 98.4 ?F (36.9 ?C) (Oral)   Resp 16   SpO2 98%  ?Gen:   Awake, no distress   ?Resp:  Normal effort  ?MSK:   Moves extremities without difficulty  ?Other:   ? ?Medical Decision Making  ?Medically screening exam initiated at 6:59 PM.  Appropriate orders placed.  WHITFIELD DULAY was informed that the remainder of the evaluation will be completed by another provider, this initial triage assessment does not replace that evaluation, and the importance of remaining in the ED until their evaluation is complete. ? ? ?  ?Loni Beckwith, PA-C ?07/14/21 1900 ? ?

## 2021-07-14 NOTE — Telephone Encounter (Signed)
Critical lab given to Dr Inda Merlin.  Pt was advised to go to ED or to see PCP today.  Said he would go by his PCP office now. ?

## 2021-07-14 NOTE — ED Triage Notes (Signed)
Pt on hyperglycemia meds and missed doses. Pt had blood sugar checked today and it was in the 500s. Was told to come to ER. Pt with no other symptoms. ?

## 2021-07-14 NOTE — Telephone Encounter (Addendum)
Pt was seen at [place] [facility] earlier today and BS was 546. Pt was told to go to hospital or PCP. Pt came to [facility]; pt said he has been out of glipizide and does not know how long or when last time pt took glipizide. Pt not sure about metformin 1000 mg bid and pt said he takes farxiga before breakfast. Pt said this morning took farxiga at 2:30 AM and ate breakfast, mcDonald egg and sausage mcmuffin and bowl of cereal with apples and cranberries. About 7:30 AM. At 2:30 pm today ate broccoli cheddar soup and drank coke zero. Pt cannot remember last time took BS. Pt said his heart felt like beating fast T 98.9. P 80 pulse ox 98% and BP 160/80 reg cuff lt arm sitting. No CP or SOB and no H/A or dizziness. Pt wants to be seen. Anastasiya CMA recked BS now 325. Sending note to Romilda Garret NP. Pt drove himself here but pt said he feels good.  I spoke with Romilda Garret NP and pt is to go to ED for eval, labs a;nd possible fluids.pt said he does not have anyone to drive him to hospital and declines EMS; pt said he feels OK to drive and if pt condition changes or worsens pt will pull his car off the road and call 911. Pt will call and schedule FU appt with Matt at later time. Sending note to Romilda Garret NP . ?

## 2021-07-14 NOTE — Telephone Encounter (Signed)
CRITICAL VALUE STICKER ? ?CRITICAL VALUE:   glucose 546 ? ?RECEIVER (on-site recipient of call):  Bonnita Nasuti, LPN ? ?DATE & TIME NOTIFIED:   07/14/2021  2:23 ? ?MESSENGER (representative from lab):  Lauren ? ?MD NOTIFIED:   Alen Blew ? ?TIME OF NOTIFICATION:  2:24 ? ? ? ?

## 2021-07-14 NOTE — ED Provider Notes (Signed)
?Brad Brown ?Provider Note ? ? ?CSN: [id number] ?Arrival date & time: 07/14/21  1745 ? ?  ? ?History ? ?Chief Complaint  ?Patient presents with  ? Hyperglycemia  ? ? ?Brad Brown is a 76 y.o. male with PMHx HTN, HLD, Diabetes who presents to the ED today with complaint of hyperglycemia.  Patient was at the cancer center earlier today for cancer screening.  He was noted to have a blood sugar of 546.  He was advised to go to the ED or follow-up with his PCP.  He drove himself to his PCPs office and there they checked his sugar and it was 325.  He was advised to go to the ED for further eval.  Does report that he has been out of his glipizide for several months.  States that he does not check his blood sugar regularly and is unsure how high it has been running recently.  He has no physical complaints at this time.  Denies any polyuria, polydipsia, polyphasia.  No chest pain, shortness of breath, abdominal pain, nausea, vomiting.  ? ?The history is provided by the patient, medical records and the spouse.  ? ?  ? ?Home Medications ?Prior to Admission medications   ?Medication Sig Start Date End Date Taking? Authorizing Provider  ?acetaminophen (TYLENOL) 325 MG tablet Take 650 mg by mouth 4 (four) times daily as needed.    [provider]  ?albuterol (VENTOLIN HFA) 108 (90 Base) MCG/ACT inhaler Inhale 2 puffs into the lungs every 6 (six) hours as needed for wheezing or shortness of breath. 11/21/20   Cable, James M, NP  ?baclofen (LIORESAL) 10 MG tablet Take 1 tablet (10 mg total) by mouth 2 (two) times daily as needed for muscle spasms. 10/20/18   Martensen, Henry Calvin III, PA-C  ?blood glucose meter kit and supplies Check sugar twice daily. DX E11.22 11/22/20   Cable, James M, NP  ?budesonide-formoterol (SYMBICORT) 160-4.5 MCG/ACT inhaler Inhale 2 puffs into the lungs 2 (two) times daily. 11/21/20   Cable, James M, NP  ?Cholecalciferol 50 MCG (2000 UT) TABS Take 1 tablet  by mouth daily. 11/05/20   [provider]  ?ciclopirox (LOPROX) 0.77 % cream Apply 1 application topically 2 (two) times daily as needed (for rash).     [provider]  ?Clotrimazole 1 % OINT Apply 1 application topically daily. 09/13/18   Baity, Regina W, NP  ?Continuous Blood Gluc Receiver (FREESTYLE LIBRE 14 DAY READER) DEVI 1 each by Does not apply route as directed. 12/12/20   Cable, James M, NP  ?FARXIGA 10 MG TABS tablet TAKE 1 TABLET BY MOUTH DAILY BEFORE BREAKFAST. 05/22/21   Cable, James M, NP  ?glipiZIDE (GLUCOTROL) 10 MG tablet Take 1 tablet (10 mg total) by mouth in the morning and at bedtime. 07/14/21 08/13/21  [name], [name], PA-C  ?HYDROcodone-acetaminophen (NORCO) 5-325 MG tablet Take 1-2 tablets by mouth every 6 (six) hours as needed for severe pain (Take tylenol for mild and moderate pain). 10/20/18   Martensen, Henry Calvin III, PA-C  ?levETIRAcetam (KEPPRA) 500 MG tablet 1 in the morning and 2 in the afternoon 07/15/20   [provider]  ?losartan (COZAAR) 100 MG tablet Take 50 mg by mouth daily.    [provider]  ?metFORMIN (GLUCOPHAGE) 1000 MG tablet Take 1 tablet (1,000 mg total) by mouth 2 (two) times daily with a meal. 11/26/20   Cable, James M, NP  ?metoprolol tartrate (LOPRESSOR) 25 MG tablet   Take 12.5 mg by mouth 2 (two) times daily. 07/21/12   Evans Lance, MD  ?Multiple Vitamin (MULTIVITAMIN) tablet Take 1 tablet by mouth daily.    [provider]  ?Niacin, Antihyperlipidemic, 500 MG TABS Take 500 mg by mouth daily. 02/28/14   Jearld Fenton, NP  ?nystatin cream (MYCOSTATIN) Apply 1 application topically 2 (two) times daily. 04/25/21   Michela Pitcher, NP  ?nystatin-triamcinolone (MYCOLOG II) cream APPLY SMALL AMOUNT TO AFFECTED AREA TWICE A DAY 10/02/20   [provider]  ?Omega-3 Fatty Acids (FISH OIL) 1200 MG CAPS Take 1,200 mg by mouth daily.     [provider]  ?rivaroxaban (XARELTO) 20 MG TABS tablet Take 20 mg by mouth  daily.     [provider]  ?senna (SENOKOT) 8.6 MG TABS tablet Take 1 tablet (8.6 mg total) by mouth 2 (two) times daily. 10/21/18   Raiford Noble Latif, DO  ?simvastatin (ZOCOR) 20 MG tablet Take 10 mg by mouth at bedtime.     [provider]  ?tamsulosin (FLOMAX) 0.4 MG CAPS capsule Take 0.4 mg by mouth daily. 02/04/15   [provider]  ?terbinafine (LAMISIL) 250 MG tablet Take 1 tablet (250 mg total) by mouth daily. 03/12/21   Felipa Furnace, DPM  ?terbinafine (LAMISIL) 250 MG tablet Take 1 tablet (250 mg total) by mouth daily. 07/11/21   Felipa Furnace, DPM  ?   ? ?Allergies    ?Ace inhibitors and Lipitor [atorvastatin]   ? ?Review of Systems   ?Review of Systems  ?Constitutional:  Negative for chills and fever.  ?Respiratory:  Negative for shortness of breath.   ?Cardiovascular:  Negative for chest pain.  ?Gastrointestinal:  Negative for abdominal pain, nausea and vomiting.  ?Endocrine: Negative for polydipsia, polyphagia and polyuria.  ?Neurological:  Negative for headaches.  ?All other systems reviewed and are negative. ? ?Physical Exam ?Updated Vital Signs ?BP (!) 157/84   Pulse 72   Temp 98.1 ?F (36.7 ?C)   Resp 19   Ht 5' 9" (1.753 m)   Wt 83.5 kg   SpO2 99%   BMI 27.17 kg/m?  ?Physical Exam ?Vitals and nursing note reviewed.  ?Constitutional:   ?   Appearance: He is not ill-appearing.  ?HENT:  ?   Head: Normocephalic and atraumatic.  ?Eyes:  ?   Conjunctiva/sclera: Conjunctivae normal.  ?Cardiovascular:  ?   Rate and Rhythm: Normal rate and regular rhythm.  ?   Pulses: Normal pulses.  ?Pulmonary:  ?   Effort: Pulmonary effort is normal. No respiratory distress.  ?   Breath sounds: Normal breath sounds. No wheezing, rhonchi or rales.  ?Abdominal:  ?   Palpations: Abdomen is soft.  ?   Tenderness: There is no abdominal tenderness.  ?Musculoskeletal:  ?   Cervical back: Neck supple.  ?Skin: ?   General: Skin is warm and dry.  ?Neurological:  ?   Mental Status: He is alert.   ? ? ?ED Results / Procedures / Treatments   ?Labs ?(all labs ordered are listed, but only abnormal results are displayed) ?Labs Reviewed  ?COMPREHENSIVE METABOLIC PANEL - Abnormal; Notable for the following components:  ?    Result Value  ? Sodium 128 (*)   ? Chloride 95 (*)   ? CO2 21 (*)   ? Glucose, Bld 262 (*)   ? Creatinine, Ser 1.34 (*)   ? GFR, Estimated 56 (*)   ? All other components within normal limits  ?  CBG MONITORING, ED - Abnormal; Notable for the following components:  ? Glucose-Capillary 243 (*)   ? All other components within normal limits  ?I-STAT VENOUS BLOOD GAS, ED - Abnormal; Notable for the following components:  ? pH, Ven 7.449 (*)   ? pCO2, Ven 31.9 (*)   ? pO2, Ven 49 (*)   ? Sodium 127 (*)   ? Calcium, Ion 1.06 (*)   ? All other components within normal limits  ?CBG MONITORING, ED - Abnormal; Notable for the following components:  ? Glucose-Capillary 203 (*)   ? All other components within normal limits  ?CBC WITH DIFFERENTIAL/PLATELET  ?BETA-HYDROXYBUTYRIC ACID  ?OSMOLALITY  ?BLOOD GAS, VENOUS  ? ? ?EKG ?None ? ?Radiology ?No results found. ? ?Procedures ?Procedures  ? ? ?Medications Ordered in ED ?Medications - No data to display ? ?ED Course/ Medical Decision Making/ A&P ?  ?                        ?Medical Decision Making ?75-year-old male who presents to the ED today with complaint for hyperglycemia.  Found to have sugars in the 500s today sent here by PCPs office.  Asymptomatic.  Has been out of glipizide for several months.  On arrival to the ED today CBG 243.  He was medically screened and work-up started in triage including BC, CMP, ABG, beta hydroxybutyric acid.  ? ?CBC without leukocytosis and hemoglobin stable without signs of anemia. ?CMP with glucose of 262, bicarb 21, no gap.  Sodium of 128 however with glucose correction 131.  Creatinine 1.34.  Per review creatinine in the past at 1.57.  No other electrolyte abnormalities. ?VBG with a pH of 7.449.  Not consistent with DKA  at this time. ?Beta hydroxybutyric acid normal at 0.14. ? ?Work-up overall reassuring in the ED today.  No concern for DKA.  Again patient is asymptomatic from his hyperglycemia.  Suspect secondary to being out of

## 2021-07-15 LAB — KAPPA/LAMBDA LIGHT CHAINS
Kappa free light chain: 161.5 mg/L — ABNORMAL HIGH (ref 3.3–19.4)
Kappa, lambda light chain ratio: 16.82 — ABNORMAL HIGH (ref 0.26–1.65)
Lambda free light chains: 9.6 mg/L (ref 5.7–26.3)

## 2021-07-16 LAB — MULTIPLE MYELOMA PANEL, SERUM
Albumin SerPl Elph-Mcnc: 3.5 g/dL (ref 2.9–4.4)
Albumin/Glob SerPl: 1.2 (ref 0.7–1.7)
Alpha 1: 0.3 g/dL (ref 0.0–0.4)
Alpha2 Glob SerPl Elph-Mcnc: 0.8 g/dL (ref 0.4–1.0)
B-Globulin SerPl Elph-Mcnc: 0.7 g/dL (ref 0.7–1.3)
Gamma Glob SerPl Elph-Mcnc: 1.2 g/dL (ref 0.4–1.8)
Globulin, Total: 3 g/dL (ref 2.2–3.9)
IgA: 42 mg/dL — ABNORMAL LOW (ref 61–437)
IgG (Immunoglobin G), Serum: 1342 mg/dL (ref 603–1613)
IgM (Immunoglobulin M), Srm: 15 mg/dL (ref 15–143)
M Protein SerPl Elph-Mcnc: 0.8 g/dL — ABNORMAL HIGH
Total Protein ELP: 6.5 g/dL (ref 6.0–8.5)

## 2021-07-21 ENCOUNTER — Ambulatory Visit: Payer: Medicare Other | Admitting: Nurse Practitioner

## 2021-07-23 ENCOUNTER — Telehealth: Payer: Self-pay | Admitting: Oncology

## 2021-07-23 NOTE — Telephone Encounter (Signed)
Called patient regarding upcoming May appointment, patient is notified. ?

## 2021-07-24 ENCOUNTER — Other Ambulatory Visit: Payer: Self-pay

## 2021-07-24 ENCOUNTER — Inpatient Hospital Stay: Payer: Medicare Other | Attending: Oncology | Admitting: Oncology

## 2021-07-24 VITALS — BP 143/64 | HR 68 | Temp 98.2°F | Resp 15 | Wt 183.5 lb

## 2021-07-24 DIAGNOSIS — E119 Type 2 diabetes mellitus without complications: Secondary | ICD-10-CM | POA: Diagnosis not present

## 2021-07-24 DIAGNOSIS — D472 Monoclonal gammopathy: Secondary | ICD-10-CM | POA: Insufficient documentation

## 2021-07-24 DIAGNOSIS — N289 Disorder of kidney and ureter, unspecified: Secondary | ICD-10-CM | POA: Diagnosis not present

## 2021-07-24 DIAGNOSIS — Z79899 Other long term (current) drug therapy: Secondary | ICD-10-CM | POA: Diagnosis not present

## 2021-07-24 NOTE — Progress Notes (Signed)
Hematology and Oncology Follow Up Visit ? ?Brad Brown ?[id number] ?25-Feb-1946 76 y.o. ?07/24/2021 3:07 PM ? ? ?Principle Diagnosis: 76 year old man with monoclonal gammopathy diagnosed in 2008.  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 ? ?Current therapy: Active surveillance. ? ?Interim History:  Brad Brown is here for a follow-up visit.  Since last visit, he was found to have an episode of hypoglycemia on his routine laboratory testing and was evaluated in the emergency department.  He was started on Glucotrol and Farxiga with better glycemic control.  He is asymptomatic at this time reports no issues.  He denies any nausea, vomiting or abdominal pain.  He denies any bone pain or pathological fractures ? ?. ? ? ?Medications: Reviewed without changes. ?Current Outpatient Medications  ?Medication Sig Dispense Refill  ? acetaminophen (TYLENOL) 325 MG tablet Take 650 mg by mouth 4 (four) times daily as needed.    ? albuterol (VENTOLIN HFA) 108 (90 Base) MCG/ACT inhaler Inhale 2 puffs into the lungs every 6 (six) hours as needed for wheezing or shortness of breath. 8 g 0  ? baclofen (LIORESAL) 10 MG tablet Take 1 tablet (10 mg total) by mouth 2 (two) times daily as needed for muscle spasms. 20 each 0  ? blood glucose meter kit and supplies Check sugar twice daily. DX E11.22 1 each 12  ? budesonide-formoterol (SYMBICORT) 160-4.5 MCG/ACT inhaler Inhale 2 puffs into the lungs 2 (two) times daily. 10.2 g 1  ? Cholecalciferol 50 MCG (2000 UT) TABS Take 1 tablet by mouth daily.    ? ciclopirox (LOPROX) 0.77 % cream Apply 1 application topically 2 (two) times daily as needed (for rash).     ? Clotrimazole 1 % OINT Apply 1 application topically daily. 30 g 0  ? Continuous Blood Gluc Receiver (FREESTYLE LIBRE 14 DAY READER) DEVI 1 each by Does not apply route as directed. 2 each 11  ? FARXIGA 10 MG TABS tablet TAKE 1 TABLET BY MOUTH DAILY BEFORE BREAKFAST. 90 tablet 1  ? glipiZIDE  (GLUCOTROL) 10 MG tablet Take 1 tablet (10 mg total) by mouth in the morning and at bedtime. 60 tablet 0  ? HYDROcodone-acetaminophen (NORCO) 5-325 MG tablet Take 1-2 tablets by mouth every 6 (six) hours as needed for severe pain (Take tylenol for mild and moderate pain). 30 tablet 0  ? levETIRAcetam (KEPPRA) 500 MG tablet 1 in the morning and 2 in the afternoon    ? losartan (COZAAR) 100 MG tablet Take 50 mg by mouth daily.    ? metFORMIN (GLUCOPHAGE) 1000 MG tablet Take 1 tablet (1,000 mg total) by mouth 2 (two) times daily with a meal. 180 tablet 3  ? metoprolol tartrate (LOPRESSOR) 25 MG tablet Take 12.5 mg by mouth 2 (two) times daily.    ? Multiple Vitamin (MULTIVITAMIN) tablet Take 1 tablet by mouth daily.    ? Niacin, Antihyperlipidemic, 500 MG TABS Take 500 mg by mouth daily. 30 tablet 2  ? nystatin cream (MYCOSTATIN) Apply 1 application topically 2 (two) times daily. 30 g 0  ? nystatin-triamcinolone (MYCOLOG II) cream APPLY SMALL AMOUNT TO AFFECTED AREA TWICE A DAY    ? Omega-3 Fatty Acids (FISH OIL) 1200 MG CAPS Take 1,200 mg by mouth daily.     ? rivaroxaban (XARELTO) 20 MG TABS tablet Take 20 mg by mouth daily.     ? senna (SENOKOT) 8.6 MG TABS tablet Take 1 tablet (8.6 mg total) by mouth 2 (  two) times daily. 120 tablet 0  ? simvastatin (ZOCOR) 20 MG tablet Take 10 mg by mouth at bedtime.     ? tamsulosin (FLOMAX) 0.4 MG CAPS capsule Take 0.4 mg by mouth daily.  5  ? terbinafine (LAMISIL) 250 MG tablet Take 1 tablet (250 mg total) by mouth daily. 90 tablet 0  ? terbinafine (LAMISIL) 250 MG tablet Take 1 tablet (250 mg total) by mouth daily. 90 tablet 0  ? ?No current facility-administered medications for this visit.  ? ? ?Allergies:  ?Allergies  ?Allergen Reactions  ? Ace Inhibitors   ? Lipitor [Atorvastatin] Rash  ? ? ? ? ?Physical Exam: ? ?Blood pressure (!) 143/64, pulse 68, temperature 98.2 ?F (36.8 ?C), temperature source Temporal, resp. rate 15, weight 183 lb 8 oz (83.2 kg), SpO2 100  %. ? ? ? ?ECOG: 0 ? ? ? ?General appearance: Alert, awake without any distress. ?Head: Atraumatic without abnormalities ?Oropharynx: Without any thrush or ulcers. ?Eyes: No scleral icterus. ?Lymph nodes: No lymphadenopathy noted in the cervical, supraclavicular, or axillary nodes ?Heart:regular rate and rhythm, without any murmurs or gallops.   ?Lung: Clear to auscultation without any rhonchi, wheezes or dullness to percussion. ?Abdomin: Soft, nontender without any shifting dullness or ascites. ?Musculoskeletal: No clubbing or cyanosis. ?Neurological: No motor or sensory deficits. ?Skin: No rashes or lesions. ? ? ?. ? ? ? ? ?Lab Results: ?Lab Results  ?Component Value Date  ? WBC 7.5 07/14/2021  ? HGB 14.3 07/14/2021  ? HCT 42.0 07/14/2021  ? MCV 91.2 07/14/2021  ? PLT 181 07/14/2021  ? ?  Chemistry   ?   ?Component Value Date/Time  ? NA 127 (L) 07/14/2021 1916  ? NA 140 01/30/2016 1129  ? K 4.1 07/14/2021 1916  ? K 4.4 01/30/2016 1129  ? CL 95 (L) 07/14/2021 1903  ? CL 104 01/06/2012 1355  ? CO2 21 (L) 07/14/2021 1903  ? CO2 26 01/30/2016 1129  ? BUN 16 07/14/2021 1903  ? BUN 15.0 01/30/2016 1129  ? CREATININE 1.34 (H) 07/14/2021 1903  ? CREATININE 1.51 (H) 07/14/2021 1329  ? CREATININE 1.29 (H) 02/17/2016 1011  ? CREATININE 1.4 (H) 01/30/2016 1129  ?    ?Component Value Date/Time  ? CALCIUM 8.9 07/14/2021 1903  ? CALCIUM 9.4 01/30/2016 1129  ? ALKPHOS 64 07/14/2021 1903  ? ALKPHOS 78 01/30/2016 1129  ? AST 19 07/14/2021 1903  ? AST 9 (L) 07/14/2021 1329  ? AST 20 01/30/2016 1129  ? ALT 17 07/14/2021 1903  ? ALT 13 07/14/2021 1329  ? ALT 28 01/30/2016 1129  ? BILITOT 0.8 07/14/2021 1903  ? BILITOT 0.9 07/14/2021 1329  ? BILITOT 0.82 01/30/2016 1129  ?  ? ? ? Latest Reference Range & Units 01/23/21 10:29 07/14/21 13:29  ?M Protein SerPl Elph-Mcnc Not Observed g/dL 0.8 (H) (C) 0.8 (H) (C)  ?IFE 1  Comment ! (C) Comment ! (C)  ?Globulin, Total 2.2 - 3.9 g/dL 2.9 (C) 3.0 (C)  ?B-Globulin SerPl Elph-Mcnc 0.7 - 1.3 g/dL  0.8 (C) 0.7 (C)  ?IgG (Immunoglobin G), Serum 603 - 1,613 mg/dL 1,515 1,342  ?IgM (Immunoglobulin M), Srm 15 - 143 mg/dL 16 15  ?IgA 61 - 437 mg/dL 39 (L) 42 (L)  ?(H): Data is abnormally high ?!: Data is abnormal ?(L): Data is abnormally low ?(C): Corrected ? ? Latest Reference Range & Units 01/23/21 10:29 07/14/21 13:29  ?Kappa free light chain 3.3 - 19.4 mg/L 130.2 (H) 161.5 (H)  ?Lambda free  light chains 5.7 - 26.3 mg/L 10.2 9.6  ?Kappa, lambda light chain ratio 0.26 - 1.65  12.76 (H) 16.82 (H)  ?(H): Data is abnormally high ? ? ? ?Impression and Plan: ? ?76 year old man with ? ?1.  Monoclonal gammopathy of undetermined significance diagnosed in 2008.  He was found to have IgG kappa without any evidence of endorgan damage. ? ?He continues to be on active surveillance without any further progression of his monoclonal gammopathy.  Protein studies obtained on July 14, 2021 showed overall stable findings without any evidence of worsening renal failure or anemia.  At this time I recommended continued surveillance with repeat laboratory testing in 12 months. ? ? ? ? ?2.  Renal insufficiency: His kidney function is stable overall without any further decline. ? ?3.  Diabetes: He is following with his primary care physician regarding this issue. ? ?4.  Follow-up: He will return in 1 year for a follow-up visit. ? ?30  Minutes were spent on this encounter.  The time was dedicated to reviewing laboratory data, disease status update and outlining future plan of care reviewed. ? ? ? ? ?Zola Button, MD ?5/4/20233:07 PM  ?

## 2021-07-28 ENCOUNTER — Ambulatory Visit (INDEPENDENT_AMBULATORY_CARE_PROVIDER_SITE_OTHER): Payer: Medicare Other | Admitting: Nurse Practitioner

## 2021-07-28 ENCOUNTER — Encounter: Payer: Self-pay | Admitting: Nurse Practitioner

## 2021-07-28 VITALS — BP 140/70 | HR 49 | Temp 97.3°F | Resp 14 | Ht 69.0 in | Wt 183.6 lb

## 2021-07-28 DIAGNOSIS — J411 Mucopurulent chronic bronchitis: Secondary | ICD-10-CM

## 2021-07-28 DIAGNOSIS — N181 Chronic kidney disease, stage 1: Secondary | ICD-10-CM | POA: Diagnosis not present

## 2021-07-28 DIAGNOSIS — E1122 Type 2 diabetes mellitus with diabetic chronic kidney disease: Secondary | ICD-10-CM

## 2021-07-28 DIAGNOSIS — R001 Bradycardia, unspecified: Secondary | ICD-10-CM | POA: Diagnosis not present

## 2021-07-28 DIAGNOSIS — I4821 Permanent atrial fibrillation: Secondary | ICD-10-CM

## 2021-07-28 LAB — POCT GLYCOSYLATED HEMOGLOBIN (HGB A1C): Hemoglobin A1C: 11.4 % — AB (ref 4.0–5.6)

## 2021-07-28 MED ORDER — ALBUTEROL SULFATE HFA 108 (90 BASE) MCG/ACT IN AERS: 2.0000 | INHALATION_SPRAY | Freq: Four times a day (QID) | RESPIRATORY_TRACT | 0 refills | Status: DC | PRN

## 2021-07-28 MED ORDER — BUDESONIDE-FORMOTEROL FUMARATE 160-4.5 MCG/ACT IN AERO: 2.0000 | INHALATION_SPRAY | Freq: Two times a day (BID) | RESPIRATORY_TRACT | 1 refills | Status: DC

## 2021-07-28 NOTE — Assessment & Plan Note (Signed)
Patient's pulse on rooming was 44 after I palpated it 49 did EKG that showed 65 but he does have an increase in his PR interval having a first-degree block patient does have a defibrillator is followed by Dr. Crissie Sickles but we will send a message to alert the change in EKG.  Patient is asymptomatic continue taking medications as prescribed by cardiologist ?

## 2021-07-28 NOTE — Progress Notes (Signed)
? ?Established Patient Office Visit ? ?Subjective   ?Patient ID: Brad Brown, male    DOB: [birth year]  Age: 76 y.o. MRN: [id number] ? ?Chief Complaint  ?Patient presents with  ? ER follow up  ?  DM f/u-patient has been checking sugar readings at home-140, 166, 164 after eating.  ? ? ?HPI ? ?DM2: States that he was checking glucose twice a day and states that they have been 140-160s. Has not been checking every day since he left the hospital. ?Does not have an appointment coming up. Glizide ? ?HTN: Daily states that he is wnl most of the time ? ? ?COPD: currently on albuterol and symbicort. Has been out of both inhalers. States that he has been having som SHOB after he is out of the inhaler. He is not PND ? ? ? ? ?Review of Systems  ?Constitutional:  Negative for chills and fever.  ?Respiratory:  Positive for shortness of breath.   ?Cardiovascular:  Negative for chest pain.  ?Gastrointestinal:  Negative for diarrhea, nausea and vomiting.  ?Genitourinary:  Positive for frequency.  ?     Nocturia  ?Neurological:  Negative for dizziness and weakness.  ? ?  ?Objective:  ?  ? ?BP 140/70   Pulse (!) 49 Comment: palpated  Temp (!) 97.3 ?F (36.3 ?C)   Resp 14   Ht '5\' 9"'$  (1.753 m)   Wt 183 lb 9 oz (83.3 kg)   SpO2 97%   BMI 27.11 kg/m?  ? ? ?Physical Exam ?Vitals and nursing note reviewed.  ?Constitutional:   ?   Appearance: Normal appearance.  ?Cardiovascular:  ?   Rate and Rhythm: Bradycardia present. Rhythm irregular.  ?   Pulses:     ?     Dorsalis pedis pulses are 2+ on the right side and 2+ on the left side.  ?     Posterior tibial pulses are 1+ on the right side and 1+ on the left side.  ?   Heart sounds: Normal heart sounds.  ?Pulmonary:  ?   Effort: Pulmonary effort is normal.  ?   Breath sounds: Normal breath sounds.  ?Abdominal:  ?   General: Bowel sounds are normal.  ?Musculoskeletal:  ?   Right lower leg: No edema.  ?   Left lower leg: No edema.  ?Feet:  ?   Right foot:  ?   Skin integrity: Skin  integrity normal.  ?   Left foot:  ?   Skin integrity: Skin integrity normal.  ?Neurological:  ?   Mental Status: He is alert.  ? ? ? ?Results for orders placed or performed in visit on 07/28/21  ?POCT glycosylated hemoglobin (Hb A1C)  ?Result Value Ref Range  ? Hemoglobin A1C 11.4 (A) 4.0 - 5.6 %  ? HbA1c POC (<> result, manual entry)    ? HbA1c, POC (prediabetic range)    ? HbA1c, POC (controlled diabetic range)    ? ? ? ? ?The ASCVD Risk score (Arnett DK, et al., 2019) failed to calculate for the following reasons: ?  The patient has a prior MI or stroke diagnosis ? ?  ?Assessment & Plan:  ? ?Problem List Items Addressed This Visit   ? ?  ? Cardiovascular and Mediastinum  ? Atrial fibrillation ([place])  ?  Patient is followed by Dr. Crissie Sickles.  Patient is anticoagulated and on beta-blocker. ? ?  ?  ?  ? Respiratory  ? COPD (chronic obstructive pulmonary disease) ([place])  ?  Relevant Medications  ? albuterol (VENTOLIN HFA) 108 (90 Base) MCG/ACT inhaler  ? budesonide-formoterol (SYMBICORT) 160-4.5 MCG/ACT inhaler  ?  ? Endocrine  ? Type 2 diabetes mellitus with stage 1 chronic kidney disease, without long-term current use of insulin ([place]) - Primary  ?  Was maintained on Farxiga 10, glipizide 10 twice daily and metformin 1000 twice daily.  Patient was not taking medication as prescribed and ran out was seen at oncologist noted to be hyperglycemic and sent to the emergency department.  They did refill patient's glipizide 10 mg twice daily.  Patient's not checking blood glucose as often as I would like.  Did encourage patient to take fasting in a.m. and then in p.m. either before bed or after eating.  A1c in office 11.4.  We will have patient follow-up in 3 months for recheck take medication as prescribed. ? ?The marked increase in patient's A1c is the likely cause of patient having nocturia and frequency. ? ?  ?  ? Relevant Orders  ? POCT glycosylated hemoglobin (Hb A1C) (Completed)  ?  ? Other  ? Bradycardia  ?   Patient's pulse on rooming was 44 after I palpated it 49 did EKG that showed 65 but he does have an increase in his PR interval having a first-degree block patient does have a defibrillator is followed by Dr. Crissie Sickles but we will send a message to alert the change in EKG.  Patient is asymptomatic continue taking medications as prescribed by cardiologist ? ?  ?  ? Relevant Orders  ? EKG 12-Lead (Completed)  ? ? ?Return in about 3 months (around 10/28/2021) for Diabetes recheck.  ? ? ?Romilda Garret, NP ? ?

## 2021-07-28 NOTE — Assessment & Plan Note (Addendum)
Was maintained on Farxiga 10, glipizide 10 twice daily and metformin 1000 twice daily.  Patient was not taking medication as prescribed and ran out was seen at oncologist noted to be hyperglycemic and sent to the emergency department.  They did refill patient's glipizide 10 mg twice daily.  Patient's not checking blood glucose as often as I would like.  Did encourage patient to take fasting in a.m. and then in p.m. either before bed or after eating.  A1c in office 11.4.  We will have patient follow-up in 3 months for recheck take medication as prescribed. ? ?The marked increase in patient's A1c is the likely cause of patient having nocturia and frequency. ?

## 2021-07-28 NOTE — Assessment & Plan Note (Signed)
Patient is followed by Dr. Crissie Sickles.  Patient is anticoagulated and on beta-blocker. ?

## 2021-07-28 NOTE — Patient Instructions (Signed)
Nice to see you today ?I want you to check your sugar first thing in the morning and in the afternoon ?I want to see you in 3 months.  ?EKG looks ok.  ?I have sent in your inhalers. Rinse out your mouth after using the Symbicort inhaler each time ?

## 2021-08-04 ENCOUNTER — Other Ambulatory Visit: Payer: Self-pay

## 2021-08-04 MED ORDER — GLIPIZIDE 10 MG PO TABS: 10.0000 mg | ORAL_TABLET | Freq: Two times a day (BID) | ORAL | 2 refills | Status: DC

## 2021-08-04 NOTE — Telephone Encounter (Signed)
You have not given to patient in past was getting from [place] but hard to get refill from them. Has 14 tab left. Would like refill called into [facility] on [address]  ?

## 2021-08-17 ENCOUNTER — Other Ambulatory Visit: Payer: Self-pay | Admitting: Podiatry

## 2021-08-17 ENCOUNTER — Other Ambulatory Visit: Payer: Self-pay | Admitting: Nurse Practitioner

## 2021-08-17 DIAGNOSIS — J411 Mucopurulent chronic bronchitis: Secondary | ICD-10-CM

## 2021-08-19 NOTE — Telephone Encounter (Signed)
Left message to call back to discuss.

## 2021-08-19 NOTE — Telephone Encounter (Signed)
Pt called back and said he was taking the albuterol inhaler twice a day. He was under the assumption that he was supposed to do that. He did not see the as needed part. I advised it is an emergency inhaler as needed and the Symbicort is twice daily. He repeated back to me that the Symbicort is daily and the albuterol is as needed. He does need an albuterol inhaler as he is out.

## 2021-08-27 ENCOUNTER — Ambulatory Visit (INDEPENDENT_AMBULATORY_CARE_PROVIDER_SITE_OTHER): Payer: Medicare Other

## 2021-08-27 DIAGNOSIS — I255 Ischemic cardiomyopathy: Secondary | ICD-10-CM | POA: Diagnosis not present

## 2021-08-28 LAB — CUP PACEART REMOTE DEVICE CHECK
Battery Remaining Longevity: 54 mo
Battery Remaining Percentage: 52 %
Battery Voltage: 2.93 V
Brady Statistic RV Percent Paced: 3.1 %
Date Time Interrogation Session: 20230607100142
HighPow Impedance: 50 Ohm
HighPow Impedance: 50 Ohm
Implantable Lead Implant Date: 20080527
Implantable Lead Location: 753860
Implantable Lead Model: 7121
Implantable Pulse Generator Implant Date: 20171201
Lead Channel Impedance Value: 440 Ohm
Lead Channel Pacing Threshold Amplitude: 1 V
Lead Channel Pacing Threshold Pulse Width: 0.5 ms
Lead Channel Sensing Intrinsic Amplitude: 10 mV
Lead Channel Setting Pacing Amplitude: 2.5 V
Lead Channel Setting Pacing Pulse Width: 0.5 ms
Lead Channel Setting Sensing Sensitivity: 0.5 mV
Pulse Gen Serial Number: 7283394

## 2021-09-03 ENCOUNTER — Ambulatory Visit: Payer: Medicare Other | Admitting: Internal Medicine

## 2021-09-03 ENCOUNTER — Encounter: Payer: Self-pay | Admitting: Internal Medicine

## 2021-09-03 VITALS — BP 130/72 | HR 68 | Ht 69.0 in | Wt 191.4 lb

## 2021-09-03 DIAGNOSIS — Z95810 Presence of automatic (implantable) cardiac defibrillator: Secondary | ICD-10-CM

## 2021-09-03 DIAGNOSIS — I498 Other specified cardiac arrhythmias: Secondary | ICD-10-CM

## 2021-09-03 DIAGNOSIS — I471 Supraventricular tachycardia: Secondary | ICD-10-CM | POA: Diagnosis not present

## 2021-09-03 DIAGNOSIS — I2510 Atherosclerotic heart disease of native coronary artery without angina pectoris: Secondary | ICD-10-CM | POA: Insufficient documentation

## 2021-09-03 NOTE — Progress Notes (Signed)
HPI Mr. Corlew returns today for ongoing followup and preoperative evaluation. He is a pleasant 76 yo man with Brugada syndrome,s/p ICD insertion. He denies chest pain or sob. He remains active. No other complaints. No ICD therapies. He does not feel his SVT. He has tried to lose weight.  Allergies  Allergen Reactions   Ace Inhibitors    Lipitor [Atorvastatin] Rash     Current Outpatient Medications  Medication Sig Dispense Refill   acetaminophen (TYLENOL) 325 MG tablet Take 650 mg by mouth 4 (four) times daily as needed.     albuterol (VENTOLIN HFA) 108 (90 Base) MCG/ACT inhaler TAKE 2 PUFFS BY MOUTH EVERY 6 HOURS AS NEEDED FOR WHEEZE OR SHORTNESS OF BREATH 8.5 each 0   baclofen (LIORESAL) 10 MG tablet Take 1 tablet (10 mg total) by mouth 2 (two) times daily as needed for muscle spasms. 20 each 0   blood glucose meter kit and supplies Check sugar twice daily. DX E11.22 1 each 12   budesonide-formoterol (SYMBICORT) 160-4.5 MCG/ACT inhaler Inhale 2 puffs into the lungs 2 (two) times daily. 10.2 g 1   Cholecalciferol 50 MCG (2000 UT) TABS Take 1 tablet by mouth daily.     ciclopirox (LOPROX) 0.77 % cream Apply 1 application topically 2 (two) times daily as needed (for rash).      Clotrimazole 1 % OINT Apply 1 application topically daily. 30 g 0   Continuous Blood Gluc Receiver (FREESTYLE LIBRE 14 DAY READER) DEVI 1 each by Does not apply route as directed. 2 each 11   FARXIGA 10 MG TABS tablet TAKE 1 TABLET BY MOUTH DAILY BEFORE BREAKFAST. 90 tablet 1   glipiZIDE (GLUCOTROL) 10 MG tablet Take 1 tablet (10 mg total) by mouth in the morning and at bedtime. 60 tablet 2   HYDROcodone-acetaminophen (NORCO) 5-325 MG tablet Take 1-2 tablets by mouth every 6 (six) hours as needed for severe pain (Take tylenol for mild and moderate pain). 30 tablet 0   levETIRAcetam (KEPPRA) 500 MG tablet 1 in the morning and 2 in the afternoon     losartan (COZAAR) 100 MG tablet Take 50 mg by mouth daily.      metFORMIN (GLUCOPHAGE) 1000 MG tablet Take 1 tablet (1,000 mg total) by mouth 2 (two) times daily with a meal. 180 tablet 3   metoprolol tartrate (LOPRESSOR) 25 MG tablet Take 12.5 mg by mouth 2 (two) times daily.     Multiple Vitamin (MULTIVITAMIN) tablet Take 1 tablet by mouth daily.     Niacin, Antihyperlipidemic, 500 MG TABS Take 500 mg by mouth daily. 30 tablet 2   nystatin cream (MYCOSTATIN) Apply 1 application topically 2 (two) times daily. 30 g 0   nystatin-triamcinolone (MYCOLOG II) cream APPLY SMALL AMOUNT TO AFFECTED AREA TWICE A DAY     Omega-3 Fatty Acids (FISH OIL) 1200 MG CAPS Take 1,200 mg by mouth daily.      rivaroxaban (XARELTO) 20 MG TABS tablet Take 20 mg by mouth daily.      senna (SENOKOT) 8.6 MG TABS tablet Take 1 tablet (8.6 mg total) by mouth 2 (two) times daily. 120 tablet 0   simvastatin (ZOCOR) 20 MG tablet Take 10 mg by mouth at bedtime.      tamsulosin (FLOMAX) 0.4 MG CAPS capsule Take 0.4 mg by mouth daily.  5   terbinafine (LAMISIL) 250 MG tablet TAKE 1 TABLET BY MOUTH EVERY DAY 90 tablet 0   Cholecalciferol 50 MCG (2000 UT)  TABS TAKE ONE TABLET BY MOUTH DAILY FOR LOW VITAMIN D (Patient not taking: Reported on 09/03/2021)     No current facility-administered medications for this visit.     Past Medical History:  Diagnosis Date   Acute myocardial infarction [facility]) 06/1983   Arthritis    Atrial fibrillation (HCC)    Automatic implantable cardiac defibrillator in situ    Borderline diabetes mellitus    BPH without obstruction/lower urinary tract symptoms    CAD (coronary artery disease)    Chronic kidney disease, stage 1    Colonic polyp 2004   hyperplastic    COPD (chronic obstructive pulmonary disease) (HCC)    Diabetes mellitus without complication ([place])    Fall    GERD (gastroesophageal reflux disease)    History of renal insufficiency syndrome    HTN (hypertension)    Hyperlipidemia    Knee derangement 09/2018   LEFT KNEE   Monoclonal  gammopathy    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   Past Surgical History:  Procedure Laterality Date   CARDIAC CATHETERIZATION  1987   Showed distal left circumflex 100% occluded    CARDIAC DEFIBRILLATOR PLACEMENT  08/17/2006   Implantation of a St. Jude single chamber defibrillator   COLONOSCOPY  07/26/2019   EP IMPLANTABLE DEVICE N/A 02/21/2016   Procedure: ICD Generator Changeout;  Surgeon: Evans Lance, MD;  Location: [place] [facility];  Service: Cardiovascular;  Laterality: N/A;   INGUINAL HERNIA REPAIR Left    QUADRICEPS TENDON REPAIR Left 10/20/2018   Procedure: REPAIR QUADRICEP TENDON;  Surgeon: Renette Butters, MD;  Location: [place];  Service: Orthopedics;  Laterality: Left;     Family History  Problem Relation Age of Onset   Colon cancer Brother    Heart attack Mother    Heart attack Brother    Seizures Neg Hx      Social History   Socioeconomic History   Marital status: Single    Spouse name: Not on file   Number of children: 1   Years of education: Not on file   Highest education level: Not on file  Occupational History   Occupation: retired    Fish farm manager: RETIRED  Tobacco Use   Smoking status: Former    Packs/day: 0.50    Years: 15.00    Total pack years: 7.50    Types: Cigarettes    Quit date: 03/23/2004    Years since quitting: 17.4   Smokeless tobacco: Never  Vaping Use   Vaping Use: Never used  Substance and Sexual Activity   Alcohol use: Yes    Alcohol/week: 1.0 standard drink of alcohol    Types: 1 Glasses of wine per week    Comment: rare--wine   Drug use: No   Sexual activity: Not Currently  Other Topics Concern   Not on file  Social History Narrative   ICD-St. Jude  Remote-Yes      Live currently @ [facility] for rehabilitation. Plans to discharge to his daughter's house  for continuation of rehab.   Social Determinants of Health   Financial Resource Strain: Not on file  Food Insecurity: Not on file  Transportation Needs: Not on file  Physical Activity: Not on file  Stress: Not on file  Social Connections: Not  on file  Intimate Partner Violence: Not on file     BP 130/72   Pulse 68   Ht 5' 9"  (1.753 m)   Wt 191 lb 6.4 oz (86.8 kg)   SpO2 94%   BMI 28.26 kg/m   Physical Exam:  Well appearing NAD HEENT: Unremarkable Neck:  No JVD, no thyromegally Lymphatics:  No adenopathy Back:  No CVA tenderness Lungs:  Clear with no wheezes HEART:  Regular rate rhythm, no murmurs, no rubs, no clicks Abd:  soft, positive bowel sounds, no organomegally, no rebound, no guarding Ext:  2 plus pulses, no edema, no cyanosis, no clubbing Skin:  No rashes no nodules Neuro:  CN II through XII intact, motor grossly intact  DEVICE  Normal device function.  See PaceArt for details.   Assess/Plan:  1. Brugada syndrome - He has had no recurrent ventricular arrhythmias. He will continue his current meds. No indication for quinidine. 2. ICD - His St. Jude single chamber ICD is working normally. Over 4.5 years of battery longevity. 3. CAD - he denies anginal symptoms. He remains active. 4. SVT - he has had no SVT since his last check.   Carleene Overlie Arlis Everly,MD

## 2021-09-03 NOTE — Patient Instructions (Signed)
Medication Instructions:  Your physician recommends that you continue on your current medications as directed. Please refer to the Current Medication list given to you today.  Labwork: None ordered.  Testing/Procedures: None ordered.  Follow-Up: Your physician wants you to follow-up in: one year with Cristopher Peru, MD or one of the following Advanced Practice Providers on your designated Care Team:   Tommye Standard, [place] Legrand Como "Jonni Sanger" Chalmers Cater, [place]  Remote monitoring is used to monitor your ICD from home. This monitoring reduces the number of office visits required to check your device to one time per year. It allows [place] to keep an eye on the functioning of your device to ensure it is working properly. You are scheduled for a device check from home on 11/26/2021. You may send your transmission at any time that day. If you have a wireless device, the transmission will be sent automatically. After your physician reviews your transmission, you will receive a postcard with your next transmission date.  Any Other Special Instructions Will Be Listed Below (If Applicable).  If you need a refill on your cardiac medications before your next appointment, please call your pharmacy.   Important Information About Sugar

## 2021-09-05 NOTE — Progress Notes (Signed)
Remote ICD transmission.   

## 2021-09-26 ENCOUNTER — Other Ambulatory Visit: Payer: Self-pay | Admitting: Nurse Practitioner

## 2021-09-26 ENCOUNTER — Telehealth: Payer: Self-pay

## 2021-09-26 MED ORDER — GLIPIZIDE 10 MG PO TABS: 10.0000 mg | ORAL_TABLET | Freq: Two times a day (BID) | ORAL | 2 refills | Status: DC

## 2021-09-26 NOTE — Telephone Encounter (Signed)
Updated medication list faxed.

## 2021-09-26 NOTE — Telephone Encounter (Addendum)
MEDICATION: glipiZIDE (GLUCOTROL) 10 MG tablet  PHARMACY: [place]   Comments: Patient is currently at the [place] and is needing to pick up the prescription there. Also needing an updated medication list - fax: [phone]  **Let patient know to contact pharmacy at the end of the day to make sure medication is ready. **  ** Please notify patient to allow 48-72 hours to process**  **Encourage patient to contact the pharmacy for refills or they can request refills through [facility]**

## 2021-09-26 NOTE — Telephone Encounter (Signed)
Can you fax an updated medication list please

## 2021-10-06 ENCOUNTER — Telehealth: Payer: Self-pay | Admitting: *Deleted

## 2021-10-06 NOTE — Telephone Encounter (Signed)
"  Did you call me a few minutes ago?"

## 2021-10-08 NOTE — Telephone Encounter (Signed)
"  When is my appointment, today or tomorrow?"  I left him a message that his appointment is tomorrow at 1:15 pm.

## 2021-10-09 ENCOUNTER — Ambulatory Visit (INDEPENDENT_AMBULATORY_CARE_PROVIDER_SITE_OTHER): Payer: Medicare Other | Admitting: Podiatry

## 2021-10-09 ENCOUNTER — Telehealth: Payer: Self-pay | Admitting: Nurse Practitioner

## 2021-10-09 DIAGNOSIS — Z79899 Other long term (current) drug therapy: Secondary | ICD-10-CM

## 2021-10-09 DIAGNOSIS — B351 Tinea unguium: Secondary | ICD-10-CM | POA: Diagnosis not present

## 2021-10-09 MED ORDER — GLIPIZIDE 10 MG PO TABS: 10.0000 mg | ORAL_TABLET | Freq: Two times a day (BID) | ORAL | 2 refills | Status: DC

## 2021-10-09 NOTE — Telephone Encounter (Signed)
Spoke with [facility] in [place] as Glipizide was refilled to them on 09/26/21 and patient was not aware. I was advised that that refill authorization was denied due to it was coming from an outside provider that was not part of [place]. The only way they would be able to fill medications is if it came from [place] provider or from outside provider that [place] referred patient to and got authorization on file with them.  Patient was advised of this. [facility]. FYI to PCP

## 2021-10-09 NOTE — Telephone Encounter (Signed)
  Encourage patient to contact the pharmacy for refills or they can request refills through [facility]  Did the patient contact the pharmacy:  no   LAST APPOINTMENT DATE:  Please schedule appointment if longer than 1 year  NEXT APPOINTMENT DATE:11/18/2021  MEDICATION:glipiZIDE (GLUCOTROL) 10 MG tablet  Is the patient out of medication? no  If not, how much is left?3 days left  Is this a 90 day supply: no  [id number] [address] AT [address]:  [phone] Fax:  [phone]

## 2021-10-15 NOTE — Progress Notes (Signed)
Subjective:  Patient ID: Brad Brown, male    DOB: March 22, 1946,  MRN: [id number]  Chief Complaint  Patient presents with   Nail Problem    76 y.o. male presents with the above complaint.  Patient presents with follow-up to left third digit onychomycosis.  He states is improving.  His fungus is completely rotted get it.  He has completed course no acute issues.  No liver issues.  Review of Systems: Negative except as noted in the HPI. Denies N/V/F/Ch.  Past Medical History:  Diagnosis Date   Acute myocardial infarction [facility]) 06/1983   Arthritis    Atrial fibrillation (HCC)    Automatic implantable cardiac defibrillator in situ    Borderline diabetes mellitus    BPH without obstruction/lower urinary tract symptoms    CAD (coronary artery disease)    Chronic kidney disease, stage 1    Colonic polyp 2004   hyperplastic    COPD (chronic obstructive pulmonary disease) (HCC)    Diabetes mellitus without complication ([place])    Fall    GERD (gastroesophageal reflux disease)    History of renal insufficiency syndrome    HTN (hypertension)    Hyperlipidemia    Knee derangement 09/2018   LEFT KNEE   Monoclonal gammopathy    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 DX E11.22, Disp: 1 each, Rfl: 12   budesonide-formoterol (SYMBICORT) 160-4.5 MCG/ACT inhaler, Inhale 2 puffs into the lungs 2  (two) times daily., Disp: 10.2 g, Rfl: 1   Cholecalciferol 50 MCG (2000 UT) TABS, Take 1 tablet by mouth daily., Disp: , Rfl:    Cholecalciferol 50 MCG (2000 UT) TABS, TAKE ONE TABLET BY MOUTH DAILY FOR LOW VITAMIN D (Patient not taking: Reported on 09/03/2021), Disp: , Rfl:    ciclopirox (LOPROX) 0.77 % cream, Apply 1 application topically 2 (two) times daily as needed (for rash). , Disp: , Rfl:    Clotrimazole 1 % OINT, Apply 1 application topically daily., Disp: 30 g, Rfl: 0   Continuous Blood Gluc Receiver (FREESTYLE LIBRE 14 DAY READER) DEVI, 1 each by Does not apply route as directed., Disp: 2 each, Rfl: 11   FARXIGA 10 MG TABS tablet, TAKE 1 TABLET BY MOUTH DAILY BEFORE BREAKFAST., Disp: 90 tablet, Rfl: 1   glipiZIDE (GLUCOTROL) 10 MG tablet, Take 1 tablet (10 mg total) by mouth in the morning and at bedtime., Disp: 60 tablet, Rfl: 2   HYDROcodone-acetaminophen (NORCO) 5-325 MG tablet, Take 1-2 tablets by mouth every 6 (six) hours as needed for severe pain (Take tylenol for mild and moderate pain)., Disp: 30 tablet, Rfl: 0   levETIRAcetam (KEPPRA) 500 MG tablet, 1 in the morning and 2 in the afternoon, Disp: , Rfl:    losartan (COZAAR) 100 MG tablet, Take 50 mg by mouth daily.,  Disp: , Rfl:    metFORMIN (GLUCOPHAGE) 1000 MG tablet, Take 1 tablet (1,000 mg total) by mouth 2 (two) times daily with a meal., Disp: 180 tablet, Rfl: 3   metoprolol tartrate (LOPRESSOR) 25 MG tablet, Take 12.5 mg by mouth 2 (two) times daily., Disp: , Rfl:    Multiple Vitamin (MULTIVITAMIN) tablet, Take 1 tablet by mouth daily., Disp: , Rfl:    Niacin, Antihyperlipidemic, 500 MG TABS, Take 500 mg by mouth daily., Disp: 30 tablet, Rfl: 2   nystatin cream (MYCOSTATIN), Apply 1 application topically 2 (two) times daily., Disp: 30 g, Rfl: 0   nystatin-triamcinolone (MYCOLOG II) cream, APPLY SMALL AMOUNT TO AFFECTED AREA TWICE A DAY, Disp: , Rfl:    Omega-3 Fatty Acids (FISH OIL) 1200 MG CAPS, Take 1,200 mg by mouth daily.  , Disp: , Rfl:    rivaroxaban (XARELTO) 20 MG TABS tablet, Take 20 mg by mouth daily. , Disp: , Rfl:    senna (SENOKOT) 8.6 MG TABS tablet, Take 1 tablet (8.6 mg total) by mouth 2 (two) times daily., Disp: 120 tablet, Rfl: 0   simvastatin (ZOCOR) 20 MG tablet, Take 10 mg by mouth at bedtime. , Disp: , Rfl:    tamsulosin (FLOMAX) 0.4 MG CAPS capsule, Take 0.4 mg by mouth daily., Disp: , Rfl: 5   terbinafine (LAMISIL) 250 MG tablet, TAKE 1 TABLET BY MOUTH EVERY DAY, Disp: 90 tablet, Rfl: 0  Social History   Tobacco Use  Smoking Status Former   Packs/day: 0.50   Years: 15.00   Total pack years: 7.50   Types: Cigarettes   Quit date: 03/23/2004   Years since quitting: 17.5  Smokeless Tobacco Never    Allergies  Allergen Reactions   Ace Inhibitors    Lipitor [Atorvastatin] Rash   Objective:  There were no vitals filed for this visit. There is no height or weight on file to calculate BMI. Constitutional Well developed. Well nourished.  Vascular Dorsalis pedis pulses palpable bilaterally. Posterior tibial pulses palpable bilaterally. Capillary refill normal to all digits.  No cyanosis or clubbing noted. Pedal hair growth normal.  Neurologic Normal speech. Oriented to person, place, and time. Epicritic sensation to light touch grossly present bilaterally.  Dermatologic Nails left third digit onychomycosis thickened elongated dystrophic mycotic nail which is now resolved.  No pain on palpation.  Improving Skin within normal limits  Orthopedic: Normal joint ROM without pain or crepitus bilaterally. No visible deformities. No bony tenderness.   Radiographs: None Assessment:   No diagnosis found.  Plan:  Patient was evaluated and treated and all questions answered.  Left third digit onychomycosis~second round -Clinically healed and is doing a lot better after second round of Lamisil.  I once again discussed prevention technique and shoe gear modification extensive detail patient  states understanding if any foot and ankle issues on future asked him to come back and see me.   No follow-ups on file.

## 2021-10-29 ENCOUNTER — Ambulatory Visit (INDEPENDENT_AMBULATORY_CARE_PROVIDER_SITE_OTHER): Payer: Medicare Other | Admitting: Nurse Practitioner

## 2021-10-29 VITALS — BP 140/62 | HR 63 | Temp 97.4°F | Resp 12 | Ht 69.0 in | Wt 195.4 lb

## 2021-10-29 DIAGNOSIS — E1122 Type 2 diabetes mellitus with diabetic chronic kidney disease: Secondary | ICD-10-CM | POA: Diagnosis not present

## 2021-10-29 DIAGNOSIS — E782 Mixed hyperlipidemia: Secondary | ICD-10-CM | POA: Diagnosis not present

## 2021-10-29 DIAGNOSIS — N181 Chronic kidney disease, stage 1: Secondary | ICD-10-CM

## 2021-10-29 DIAGNOSIS — I10 Essential (primary) hypertension: Secondary | ICD-10-CM | POA: Diagnosis not present

## 2021-10-29 LAB — POCT GLYCOSYLATED HEMOGLOBIN (HGB A1C): Hemoglobin A1C: 6.9 % — AB (ref 4.0–5.6)

## 2021-10-29 NOTE — Assessment & Plan Note (Signed)
Patient currently maintained on metoprolol and losartan.  Does check blood pressure at home within normal limits.  Patient borderline today no change in regimen.  Patient is followed by cardiology.  Continue checking blood pressure at home

## 2021-10-29 NOTE — Patient Instructions (Addendum)
We are going to [place] (dapaglifizon). We will continue with the Jardiance (empaglifizon)   After you use the Symbicort rinse your mouth out after each use. Your A1C in office was 6.9. That is great  Continue checking your sugar at home and checking your blood pressure.  I want to see you in 3 more months

## 2021-10-29 NOTE — Progress Notes (Signed)
Established Patient Office Visit  Subjective   Patient ID: Brad Brown, male    DOB: Aug 12, 1945  Age: 76 y.o. MRN: [id number]  Chief Complaint  Patient presents with   Diabetes    Follow up    HPI  DM2: Patient currently maintained on 12.5 jardiance, metformin, glipizide. Does check his glucose once a day. States that is been being 112 or a little higher High: 146 Low:100 Patient denies any hypoglycemia.  States he was recently solved by the [place] and they did do blood work with him.  HTN: currently maintained on losartan, metoprolol. Does check blood pressure once a week at home. States at home is below 140/90.  HLD: Simvastatin currently.  Diet: 2 meals a day. States that he is eating low fat. Very little snacking. States that he is doing 1/2 and 1/2 tea and  water.    Review of Systems  Constitutional:  Negative for chills and fever.  Respiratory:  Negative for shortness of breath.   Cardiovascular:  Negative for chest pain and leg swelling.  Gastrointestinal:  Negative for diarrhea, nausea and vomiting.       BM daily  Genitourinary:  Negative for dysuria.  Neurological:  Negative for headaches.  Psychiatric/Behavioral:  Negative for hallucinations and suicidal ideas.       Objective:     BP (!) 140/62   Pulse 63   Temp (!) 97.4 F (36.3 C)   Resp 12   Ht '5\' 9"'$  (1.753 m)   Wt 195 lb 6 oz (88.6 kg)   SpO2 97%   BMI 28.85 kg/m  BP Readings from Last 3 Encounters:  10/29/21 (!) 140/62  09/03/21 130/72  07/28/21 140/70   Wt Readings from Last 3 Encounters:  10/29/21 195 lb 6 oz (88.6 kg)  09/03/21 191 lb 6.4 oz (86.8 kg)  07/28/21 183 lb 9 oz (83.3 kg)      Physical Exam Vitals and nursing note reviewed.  Constitutional:      Appearance: Normal appearance. He is obese.  Cardiovascular:     Rate and Rhythm: Normal rate and regular rhythm.     Pulses:          Dorsalis pedis pulses are 2+ on the right side and 2+ on the left side.        Posterior tibial pulses are 2+ on the right side and 2+ on the left side.     Heart sounds: Normal heart sounds.  Pulmonary:     Effort: Pulmonary effort is normal.     Breath sounds: Normal breath sounds.  Abdominal:     General: Bowel sounds are normal.  Neurological:     Mental Status: He is alert.      Results for orders placed or performed in visit on 10/29/21  POCT glycosylated hemoglobin (Hb A1C)  Result Value Ref Range   Hemoglobin A1C 6.9 (A) 4.0 - 5.6 %   HbA1c POC (<> result, manual entry)     HbA1c, POC (prediabetic range)     HbA1c, POC (controlled diabetic range)        The ASCVD Risk score (Arnett DK, et al., 2019) failed to calculate for the following reasons:   The patient has a prior MI or stroke diagnosis    Assessment & Plan:   Problem List Items Addressed This Visit       Cardiovascular and Mediastinum   Hypertension    Patient currently maintained on metoprolol and losartan.  Does check  blood pressure at home within normal limits.  Patient borderline today no change in regimen.  Patient is followed by cardiology.  Continue checking blood pressure at home        Endocrine   Type 2 diabetes mellitus with stage 1 chronic kidney disease, without long-term current use of insulin ([place]) - Primary    Patient currently on glipizide, metformin, and Jardiance.  Patient was taking [place] and Farxiga at the same time.  Will discontinue Wilder Glade and keep him on Jardiance 12.5 mg the [place] has him.  Patient's A1c was dramatically improved at 6.9 today.  Will continue regimen no changes at current time if patient has low sugar will consider decreasing glipizide from 10 mg to 5 mg.  Follow-up in 3 months      Relevant Medications   empagliflozin (JARDIANCE) 25 MG TABS tablet   Other Relevant Orders   POCT glycosylated hemoglobin (Hb A1C) (Completed)     Other   HLD (hyperlipidemia)    Currently maintained on simvastatin.  Doing decently well with his diet seems.   Gain since last office visit.  Continue to monitor       Return in about 3 months (around 01/29/2022) for DM recheck.    Romilda Garret, NP

## 2021-10-29 NOTE — Assessment & Plan Note (Signed)
Patient currently on glipizide, metformin, and Jardiance.  Patient was taking [place] and Farxiga at the same time.  Will discontinue Wilder Glade and keep him on Jardiance 12.5 mg the [place] has him.  Patient's A1c was dramatically improved at 6.9 today.  Will continue regimen no changes at current time if patient has low sugar will consider decreasing glipizide from 10 mg to 5 mg.  Follow-up in 3 months

## 2021-10-29 NOTE — Assessment & Plan Note (Signed)
Currently maintained on simvastatin.  Doing decently well with his diet seems.  Gain since last office visit.  Continue to monitor

## 2021-11-06 ENCOUNTER — Encounter: Payer: Self-pay | Admitting: Gastroenterology

## 2021-11-12 ENCOUNTER — Other Ambulatory Visit: Payer: Self-pay | Admitting: Nurse Practitioner

## 2021-11-12 DIAGNOSIS — E1122 Type 2 diabetes mellitus with diabetic chronic kidney disease: Secondary | ICD-10-CM

## 2021-11-13 ENCOUNTER — Encounter: Payer: Self-pay | Admitting: Gastroenterology

## 2021-11-13 ENCOUNTER — Other Ambulatory Visit: Payer: Self-pay | Admitting: Nurse Practitioner

## 2021-11-13 ENCOUNTER — Telehealth: Payer: Self-pay | Admitting: *Deleted

## 2021-11-13 DIAGNOSIS — E1122 Type 2 diabetes mellitus with diabetic chronic kidney disease: Secondary | ICD-10-CM

## 2021-11-13 NOTE — Telephone Encounter (Signed)
Spoke with pt. He verified that he was taking blood thinner,pt. Scheduled to see provider in office and pre-visit and procedure cancelled,all questions answered and verbalized understanding.

## 2021-11-20 ENCOUNTER — Ambulatory Visit (INDEPENDENT_AMBULATORY_CARE_PROVIDER_SITE_OTHER): Payer: Medicare Other | Admitting: Nurse Practitioner

## 2021-11-20 VITALS — BP 132/80 | HR 74 | Temp 96.9°F | Resp 16 | Ht 69.0 in | Wt 195.1 lb

## 2021-11-20 DIAGNOSIS — R21 Rash and other nonspecific skin eruption: Secondary | ICD-10-CM

## 2021-11-20 DIAGNOSIS — B86 Scabies: Secondary | ICD-10-CM

## 2021-11-20 DIAGNOSIS — E1122 Type 2 diabetes mellitus with diabetic chronic kidney disease: Secondary | ICD-10-CM

## 2021-11-20 DIAGNOSIS — N181 Chronic kidney disease, stage 1: Secondary | ICD-10-CM

## 2021-11-20 LAB — GLUCOSE, POCT (MANUAL RESULT ENTRY): POC Glucose: 150 mg/dl — AB (ref 70–99)

## 2021-11-20 MED ORDER — PERMETHRIN 5 % EX CREA: TOPICAL_CREAM | CUTANEOUS | 0 refills | Status: DC

## 2021-11-20 MED ORDER — METHYLPREDNISOLONE ACETATE 40 MG/ML IJ SUSP
40.0000 mg | Freq: Once | INTRAMUSCULAR | Status: AC
  Administered 2021-11-20: 40 mg via INTRAMUSCULAR

## 2021-11-20 NOTE — Assessment & Plan Note (Signed)
Likely scabies given presentation distribution and locations.  We will prescribe Elimite cream.  Did review verbally and visually in office where to apply and when to wash off.  Patient can start using his Mycolog cream thereafter if needed once he has completed the full course of the Elimite cream

## 2021-11-20 NOTE — Patient Instructions (Signed)
Nice to see you today The injection should help with the itching. It can cause your sugar level to go up over the next 3 days. I sent in a body wash to use ONE time. Follow the instructions. This should get the rash to go away Follow up if no improvement

## 2021-11-20 NOTE — Assessment & Plan Note (Signed)
Rash that is most likely scabies.  He can use the Elimite cream did administer Depo-Medrol 40 mg IM x1 dose in office.  Did give precautions in regards to elevating patient's blood glucose levels

## 2021-11-20 NOTE — Progress Notes (Signed)
Acute Office Visit  Subjective:     Patient ID: ANKITH EDMONSTON, male    DOB: 10-16-1945, 76 y.o.   MRN: [id number]  Chief Complaint  Patient presents with   Rash    Rash present x 2 weeks, itching present. Rash is present on left ankle, left hand and swelling, on left buttocks area and back area.      Patient is in today for Rash   States that it has been there 1-1.5 week and have gotten worse. States that it itches. States that no new medications. No changes to soap or detergent  States that he has used nystatin- triamcinolone and alcohol on it. States the cream has helped some but has not made it got away. No antihistamine use  Left hand: swelling states that it has been before the rash. That he will get some tingle and twitch.  States he has been checking his glucose at home and they run about 130.   Review of Systems  Constitutional:  Negative for chills and fever.  Respiratory:  Negative for shortness of breath.   Cardiovascular:  Negative for chest pain.  Skin:  Positive for itching and rash.        Objective:    BP 132/80   Pulse 74   Temp (!) 96.9 F (36.1 C) (Temporal)   Resp 16   Ht '5\' 9"'$  (1.753 m)   Wt 195 lb 2 oz (88.5 kg)   SpO2 97%   BMI 28.81 kg/m    Physical Exam Vitals and nursing note reviewed.  Constitutional:      Appearance: Normal appearance.  Cardiovascular:     Rate and Rhythm: Normal rate and regular rhythm.     Heart sounds: Normal heart sounds.  Pulmonary:     Effort: Pulmonary effort is normal.     Breath sounds: Normal breath sounds.  Skin:    Findings: Erythema and rash present.          Comments: Singular lesions that are papular in nature with erythematous surrounding.  Neurological:     Mental Status: He is alert.     Results for orders placed or performed in visit on 11/20/21  Glucose (CBG)  Result Value Ref Range   POC Glucose 150 (A) 70 - 99 mg/dl        Assessment & Plan:   Problem List Items  Addressed This Visit       Endocrine   Type 2 diabetes mellitus with stage 1 chronic kidney disease, without long-term current use of insulin ([place])    Patient glucose was 150 in office today.  Will administer Depo-Medrol 40      Relevant Orders   Glucose (CBG) (Completed)     Musculoskeletal and Integument   Rash - Primary    Rash that is most likely scabies.  He can use the Elimite cream did administer Depo-Medrol 40 mg IM x1 dose in office.  Did give precautions in regards to elevating patient's blood glucose levels      Scabies    Likely scabies given presentation distribution and locations.  We will prescribe Elimite cream.  Did review verbally and visually in office where to apply and when to wash off.  Patient can start using his Mycolog cream thereafter if needed once he has completed the full course of the Elimite cream      Relevant Medications   permethrin (ELIMITE) 5 % cream    Meds ordered this encounter  Medications  methylPREDNISolone acetate (DEPO-MEDROL) injection 40 mg   permethrin (ELIMITE) 5 % cream    Sig: Apply to entire skin from chin down to and including toes under fingernails and toenails. Leave on 8-14 hours. Repeat in 1-2 weeks. Reapply to hands after washing.    Dispense:  30 g    Refill:  0    Order Specific Question:   Supervising Provider    Answer:   Loura Pardon A [[id number]]    Return if symptoms worsen or fail to improve, for as scheduled .  Romilda Garret, NP

## 2021-11-20 NOTE — Assessment & Plan Note (Signed)
Patient glucose was 150 in office today.  Will administer Depo-Medrol 40

## 2021-11-26 ENCOUNTER — Ambulatory Visit (INDEPENDENT_AMBULATORY_CARE_PROVIDER_SITE_OTHER): Payer: Medicare Other

## 2021-11-26 ENCOUNTER — Telehealth: Payer: Self-pay | Admitting: Internal Medicine

## 2021-11-26 DIAGNOSIS — I255 Ischemic cardiomyopathy: Secondary | ICD-10-CM | POA: Diagnosis not present

## 2021-11-26 LAB — CUP PACEART REMOTE DEVICE CHECK
Battery Remaining Longevity: 53 mo
Battery Remaining Percentage: 51 %
Battery Voltage: 2.92 V
Brady Statistic RV Percent Paced: 2.9 %
Date Time Interrogation Session: 20230906074859
HighPow Impedance: 55 Ohm
HighPow Impedance: 55 Ohm
Implantable Lead Implant Date: 20080527
Implantable Lead Location: 753860
Implantable Lead Model: 7121
Implantable Pulse Generator Implant Date: 20171201
Lead Channel Impedance Value: 510 Ohm
Lead Channel Pacing Threshold Amplitude: 1.25 V
Lead Channel Pacing Threshold Pulse Width: 0.5 ms
Lead Channel Sensing Intrinsic Amplitude: 12 mV
Lead Channel Setting Pacing Amplitude: 2.5 V
Lead Channel Setting Pacing Pulse Width: 0.5 ms
Lead Channel Setting Sensing Sensitivity: 0.5 mV
Pulse Gen Serial Number: 7283394

## 2021-11-26 NOTE — Telephone Encounter (Signed)
Remote transmission received and appears normal device function. Returned patients call and updated. Voiced appreciation.

## 2021-11-26 NOTE — Telephone Encounter (Signed)
  1. Has your device fired? No   2. Is you device beeping? No   3. Are you experiencing draining or swelling at device site? No   4. Are you calling to see if we received your device transmission? Yes  5. Have you passed out? No     Please route to Device Clinic Pool  

## 2021-12-11 ENCOUNTER — Other Ambulatory Visit: Payer: Self-pay | Admitting: Nurse Practitioner

## 2021-12-11 DIAGNOSIS — J411 Mucopurulent chronic bronchitis: Secondary | ICD-10-CM

## 2021-12-11 NOTE — Telephone Encounter (Signed)
  Encourage patient to contact the pharmacy for refills or they can request refills through [facility]  Did the patient contact the pharmacy: Yes  LAST APPOINTMENT DATE: 11/20/2021  NEXT APPOINTMENT DATE: 01/29/2022  MEDICATION: budesonide-formoterol (SYMBICORT) 160-4.5 MCG/ACT inhaler  Is the patient out of medication? No  PHARMACY: [id number] [place], [place] - [address] Let patient know to contact pharmacy at the end of the day to make sure medication is ready.  Please notify patient to allow 48-72 hours to process

## 2021-12-12 MED ORDER — BUDESONIDE-FORMOTEROL FUMARATE 160-4.5 MCG/ACT IN AERO: 2.0000 | INHALATION_SPRAY | Freq: Two times a day (BID) | RESPIRATORY_TRACT | 1 refills | Status: DC

## 2021-12-15 NOTE — Progress Notes (Signed)
Remote ICD transmission.   

## 2021-12-17 ENCOUNTER — Encounter: Payer: Self-pay | Admitting: Gastroenterology

## 2021-12-17 ENCOUNTER — Ambulatory Visit (INDEPENDENT_AMBULATORY_CARE_PROVIDER_SITE_OTHER): Payer: Medicare Other | Admitting: Gastroenterology

## 2021-12-17 VITALS — BP 112/56 | HR 46 | Ht 69.0 in | Wt 194.8 lb

## 2021-12-17 DIAGNOSIS — Z86010 Personal history of colonic polyps: Secondary | ICD-10-CM | POA: Insufficient documentation

## 2021-12-17 NOTE — Progress Notes (Signed)
Patient came in to schedule colonoscopy for colonoscopy recall.  He is actually not due.  He was sent a letter changing his date to May 2024, but he does not recall receiving that.  He is advised that he will not be charged for this visit today and will be entered for recall from May 2024 instead.  He is in agreement with this.  Has no complaints today.

## 2021-12-17 NOTE — Patient Instructions (Signed)
Today we are giving you a copy of your 2021 colonoscopy and path reports for your records.  Your next colonoscopy is due 07/2022.   I appreciate the opportunity to care for you. Alonza Bogus, PA-C

## 2021-12-30 ENCOUNTER — Ambulatory Visit (INDEPENDENT_AMBULATORY_CARE_PROVIDER_SITE_OTHER)
Admission: RE | Admit: 2021-12-30 | Discharge: 2021-12-30 | Disposition: A | Discharge status: Home / Self Care | Payer: Medicare Other | Source: Ambulatory Visit | Attending: Nurse Practitioner | Admitting: Nurse Practitioner

## 2021-12-30 ENCOUNTER — Ambulatory Visit (INDEPENDENT_AMBULATORY_CARE_PROVIDER_SITE_OTHER): Payer: Medicare Other | Admitting: Nurse Practitioner

## 2021-12-30 ENCOUNTER — Encounter: Payer: Self-pay | Admitting: Nurse Practitioner

## 2021-12-30 VITALS — BP 118/62 | HR 83 | Temp 96.5°F | Resp 14 | Wt 197.1 lb

## 2021-12-30 DIAGNOSIS — J069 Acute upper respiratory infection, unspecified: Secondary | ICD-10-CM

## 2021-12-30 DIAGNOSIS — R059 Cough, unspecified: Secondary | ICD-10-CM | POA: Diagnosis not present

## 2021-12-30 DIAGNOSIS — R062 Wheezing: Secondary | ICD-10-CM | POA: Diagnosis not present

## 2021-12-30 DIAGNOSIS — R0989 Other specified symptoms and signs involving the circulatory and respiratory systems: Secondary | ICD-10-CM | POA: Diagnosis not present

## 2021-12-30 MED ORDER — AMOXICILLIN-POT CLAVULANATE 875-125 MG PO TABS: 1.0000 | ORAL_TABLET | Freq: Two times a day (BID) | ORAL | 0 refills | Status: AC

## 2021-12-30 NOTE — Progress Notes (Signed)
Acute Office Visit  Subjective:     Patient ID: Brad Brown, male    DOB: Aug 17, 1945, 76 y.o.   MRN: [id number]  Chief Complaint  Patient presents with   Cough    Started about a week ago, coughing up yellow phlegm, head pressure, slight sinus pain, runny nose, post nasal drip. No fever.      Patient is in today for cough  Started about a week ago No sick contacts. States that he has been wearing a mask at a family birthday. Covid vacines updated Did not covid test at home Has been using cold and sinus pills with some relief   Symptoms have gotten worse since onset.  Review of Systems  Constitutional:  Negative for chills, fever and malaise/fatigue.       Appetite is normal   HENT:  Positive for ear pain, sinus pain and sore throat (scratchy). Negative for ear discharge.   Respiratory:  Positive for cough, sputum production (thick yellow) and shortness of breath.   Cardiovascular:  Negative for chest pain.  Gastrointestinal:  Negative for abdominal pain, diarrhea, nausea and vomiting.  Musculoskeletal:  Negative for joint pain and myalgias.  Neurological:  Negative for headaches.        Objective:    BP 118/62   Pulse 83   Temp (!) 96.5 F (35.8 C) (Temporal)   Resp 14   Wt 197 lb 2 oz (89.4 kg)   SpO2 96%   BMI 29.11 kg/m  BP Readings from Last 3 Encounters:  12/30/21 118/62  12/17/21 (!) 112/56  11/20/21 132/80   Wt Readings from Last 3 Encounters:  12/30/21 197 lb 2 oz (89.4 kg)  12/17/21 194 lb 12.8 oz (88.4 kg)  11/20/21 195 lb 2 oz (88.5 kg)      Physical Exam Vitals and nursing note reviewed.  Constitutional:      Appearance: Normal appearance.  HENT:     Left Ear: Tympanic membrane, ear canal and external ear normal.     Nose:     Right Sinus: No maxillary sinus tenderness or frontal sinus tenderness.     Left Sinus: No maxillary sinus tenderness or frontal sinus tenderness.     Mouth/Throat:     Mouth: Mucous membranes are moist.      Pharynx: Oropharynx is clear.  Cardiovascular:     Rate and Rhythm: Normal rate and regular rhythm.     Heart sounds: Normal heart sounds.  Pulmonary:     Effort: Pulmonary effort is normal.     Breath sounds: Rales (LLL that seemded to clear with coughing) present.  Neurological:     Mental Status: He is alert.     No results found for any visits on 12/30/21.      Assessment & Plan:   Problem List Items Addressed This Visit       Respiratory   Upper respiratory tract infection - Primary    Given length of symptoms and patient's comorbidities and age would like to treat with Augmentin 875-125 mg twice daily for 7 days.  Patient did have advantageous breath sounds on the left lower lobe but did seem to clear with coughing but we are pending chest x-ray.  Patient will follow-up if no improvement.      Relevant Medications   amoxicillin-clavulanate (AUGMENTIN) 875-125 MG tablet   Other Relevant Orders   DG Chest 2 View    Meds ordered this encounter  Medications   amoxicillin-clavulanate (AUGMENTIN) 875-125 MG  tablet    Sig: Take 1 tablet by mouth 2 (two) times daily for 7 days.    Dispense:  14 tablet    Refill:  0    Order Specific Question:   Supervising Provider    Answer:   Loura Pardon A [[id number]]    Return if symptoms worsen or fail to improve, for As scheduled.  Romilda Garret, NP

## 2021-12-30 NOTE — Assessment & Plan Note (Signed)
Given length of symptoms and patient's comorbidities and age would like to treat with Augmentin 875-125 mg twice daily for 7 days.  Patient did have advantageous breath sounds on the left lower lobe but did seem to clear with coughing but we are pending chest x-ray.  Patient will follow-up if no improvement.

## 2021-12-30 NOTE — Patient Instructions (Signed)
Nice to see you today I have sent an antibiotic to the pharmacy I will be in touch with the xray results once I have them Keep your scheduled appointment with me next month Follow up if you do not improve with the anitbiotic

## 2021-12-31 ENCOUNTER — Encounter: Payer: Medicare Other | Admitting: Gastroenterology

## 2022-01-29 ENCOUNTER — Encounter: Payer: Self-pay | Admitting: Nurse Practitioner

## 2022-01-29 ENCOUNTER — Ambulatory Visit (INDEPENDENT_AMBULATORY_CARE_PROVIDER_SITE_OTHER): Payer: Medicare Other | Admitting: Nurse Practitioner

## 2022-01-29 VITALS — BP 124/62 | HR 49 | Temp 98.0°F | Resp 16 | Ht 69.0 in | Wt 197.1 lb

## 2022-01-29 DIAGNOSIS — N400 Enlarged prostate without lower urinary tract symptoms: Secondary | ICD-10-CM

## 2022-01-29 DIAGNOSIS — J42 Unspecified chronic bronchitis: Secondary | ICD-10-CM | POA: Diagnosis not present

## 2022-01-29 DIAGNOSIS — E559 Vitamin D deficiency, unspecified: Secondary | ICD-10-CM

## 2022-01-29 DIAGNOSIS — E1122 Type 2 diabetes mellitus with diabetic chronic kidney disease: Secondary | ICD-10-CM

## 2022-01-29 DIAGNOSIS — R569 Unspecified convulsions: Secondary | ICD-10-CM

## 2022-01-29 DIAGNOSIS — I10 Essential (primary) hypertension: Secondary | ICD-10-CM

## 2022-01-29 DIAGNOSIS — E7849 Other hyperlipidemia: Secondary | ICD-10-CM

## 2022-01-29 DIAGNOSIS — D472 Monoclonal gammopathy: Secondary | ICD-10-CM | POA: Diagnosis not present

## 2022-01-29 DIAGNOSIS — I498 Other specified cardiac arrhythmias: Secondary | ICD-10-CM | POA: Diagnosis not present

## 2022-01-29 DIAGNOSIS — N181 Chronic kidney disease, stage 1: Secondary | ICD-10-CM | POA: Diagnosis not present

## 2022-01-29 DIAGNOSIS — Z125 Encounter for screening for malignant neoplasm of prostate: Secondary | ICD-10-CM

## 2022-01-29 DIAGNOSIS — Z0000 Encounter for general adult medical examination without abnormal findings: Secondary | ICD-10-CM

## 2022-01-29 LAB — POCT GLYCOSYLATED HEMOGLOBIN (HGB A1C): Hemoglobin A1C: 8.2 % — AB (ref 4.0–5.6)

## 2022-01-29 LAB — MICROALBUMIN / CREATININE URINE RATIO
Creatinine,U: 101.6 mg/dL
Microalb Creat Ratio: 2.7 mg/g (ref 0.0–30.0)
Microalb, Ur: 2.7 mg/dL — ABNORMAL HIGH (ref 0.0–1.9)

## 2022-01-29 NOTE — Assessment & Plan Note (Signed)
Patient currently maintained on Symbicort and albuterol inhaler as needed.  Tolerating medications well has not needed his albuterol inhaler continue Symbicort and albuterol as needed.

## 2022-01-29 NOTE — Assessment & Plan Note (Signed)
Pending vitamin D level today.

## 2022-01-29 NOTE — Assessment & Plan Note (Signed)
Patient currently maintained on losartan and metoprolol.  Blood pressure within normal limits today.  Continue taking medication as prescribed

## 2022-01-29 NOTE — Assessment & Plan Note (Signed)
Patient currently maintained on simvastatin 20 mg.  Pending lipid panel today.

## 2022-01-29 NOTE — Assessment & Plan Note (Signed)
Discussed age-appropriate immunizations and screening exams.  Patient is not due for colonoscopy until next year.  We will do PSA screening today.  Patient does not qualify for LDCT.  Preventative healthcare maintenance and anticipatory guidance information printed off and given to patient at dismissal of clinic.  Encouraged healthy lifestyle modifications.

## 2022-01-29 NOTE — Assessment & Plan Note (Signed)
Was followed by [facility].  Patient currently on Keppra last seizure been several years ago.  Continue medication as prescribed continue following up with [facility] as recommended.

## 2022-01-29 NOTE — Assessment & Plan Note (Signed)
Patient currently maintained and followed by hematology/oncology yearly.  Continue following up with them as recommended.

## 2022-01-29 NOTE — Assessment & Plan Note (Signed)
A1c of 8.2% in office today.  He does have very labile A1c's continue glipizide 10 mg twice daily metformin twice daily and Jardiance daily.  Will not change medication at this point in time patient work on lifestyle modifications such as consumption of ice cream per his report.

## 2022-01-29 NOTE — Assessment & Plan Note (Signed)
Patient currently followed by Dr. Crissie Sickles continue following up as recommended

## 2022-01-29 NOTE — Progress Notes (Signed)
Established Patient Office Visit  Subjective   Patient ID: Brad Brown, male    DOB: 21-Jul-1945  Age: 76 y.o. MRN: [id number]  Chief Complaint  Patient presents with   Diabetes    Follow up, readings at home have been around 150s       DM2: checking sugar approx every other dayrangingin in the 150s. States no low sugars per his report High sugar 180  COPD: currenlty on symbicort well. No shortness of breath and no albuterol use  MGUS: Dr Alen Blew in November once a year  HTN: Has cuff at home. States that he checks it once a week.   Afib: Dr. Marya Amsler taylor. Louanna Raw and metoprol   Seizures: last seixure was approx and is on [place]. [facility]  for complete physical and follow up of chronic conditions.  Immunizations: -Tetanus:2015 -Influenza: UTD -Shingles: UTD -Pneumonia: UTD  -HPV: aged out  Diet: [place]. 2 meals a day. States he will have some coffee sometimes. Mostly water.  Exercise: No regular exercise. Stationary bike 20-30 mins every day   Eye exam: Completes annually .  States jan at the [place] exam: Completes semi-annually   Colonoscopy: Completed in 2015, due May 2024. Lung Cancer Screening: NA Dexa: NA  PSA: Due, last one 11/2020  Sleep: Goes to bed around 830-10 and gets up around 6. Feels rested. States that he gets up every 2 hours to use the bathroom     Review of Systems  Constitutional:  Negative for chills and fever.  Respiratory:  Negative for shortness of breath.   Cardiovascular:  Negative for chest pain.  Gastrointestinal:  Positive for diarrhea (loose stools). Negative for abdominal pain, blood in stool, constipation, nausea and vomiting.       BM daily   Genitourinary:  Negative for dysuria and hematuria.  Neurological:  Positive for tingling (LLL with position and intermittent). Negative for dizziness and headaches.  Psychiatric/Behavioral:  Negative for hallucinations and suicidal ideas.       Objective:     BP 124/62    Pulse (!) 49   Temp 98 F (36.7 C)   Resp 16   Ht '5\' 9"'$  (1.753 m)   Wt 197 lb 2 oz (89.4 kg)   SpO2 99%   BMI 29.11 kg/m  BP Readings from Last 3 Encounters:  01/29/22 124/62  12/30/21 118/62  12/17/21 (!) 112/56   Wt Readings from Last 3 Encounters:  01/29/22 197 lb 2 oz (89.4 kg)  12/30/21 197 lb 2 oz (89.4 kg)  12/17/21 194 lb 12.8 oz (88.4 kg)      Physical Exam Vitals and nursing note reviewed.  Constitutional:      Appearance: Normal appearance.  HENT:     Right Ear: Tympanic membrane, ear canal and external ear normal.     Left Ear: Tympanic membrane, ear canal and external ear normal.     Mouth/Throat:     Mouth: Mucous membranes are moist.     Pharynx: Oropharynx is clear.  Eyes:     Extraocular Movements: Extraocular movements intact.     Pupils: Pupils are equal, round, and reactive to light.  Neck:     Thyroid: No thyroid mass, thyromegaly or thyroid tenderness.  Cardiovascular:     Rate and Rhythm: Normal rate and regular rhythm.     Heart sounds: Normal heart sounds.  Pulmonary:     Effort: Pulmonary effort is normal.     Breath sounds: Normal breath sounds.  Abdominal:  General: Bowel sounds are normal. There is no distension.     Palpations: There is no mass.     Tenderness: There is no abdominal tenderness.     Hernia: No hernia is present.  Musculoskeletal:     Right lower leg: No edema.     Left lower leg: No edema.  Lymphadenopathy:     Cervical: No cervical adenopathy.  Skin:    General: Skin is warm.  Neurological:     General: No focal deficit present.     Mental Status: He is alert.  Psychiatric:        Mood and Affect: Mood normal.        Behavior: Behavior normal.        Thought Content: Thought content normal.        Judgment: Judgment normal.    Diabetic Foot Form - Detailed   Diabetic Foot Exam - detailed Diabetic Foot exam was performed with the following findings: Yes   Is there swelling or and abnormal foot  shape?: No Is there a claw toe deformity?: No Is there elevated skin temparature?: No Pulse Foot Exam completed.: Yes   Right posterior Tibialias: Present Left posterior Tibialias: Present   Right Dorsalis Pedis: Present Left Dorsalis Pedis: Present  Sensory Foot Exam Completed.: Yes Semmes-Weinstein Monofilament Test   Comments: All 10 sites bilaterally sensation intact      No results found for any visits on 01/29/22.    The ASCVD Risk score (Arnett DK, et al., 2019) failed to calculate for the following reasons:   The patient has a prior MI or stroke diagnosis    Assessment & Plan:   Problem List Items Addressed This Visit       Cardiovascular and Mediastinum   Hypertension    Patient currently maintained on losartan and metoprolol.  Blood pressure within normal limits today.  Continue taking medication as prescribed      Brugada syndrome    Patient currently followed by Dr. Crissie Sickles continue following up as recommended        Respiratory   COPD (chronic obstructive pulmonary disease) ([place])    Patient currently maintained on Symbicort and albuterol inhaler as needed.  Tolerating medications well has not needed his albuterol inhaler continue Symbicort and albuterol as needed.        Endocrine   Type 2 diabetes mellitus with stage 1 chronic kidney disease, without long-term current use of insulin ([facility]) - Primary    A1c of 8.2% in office today.  He does have very labile A1c's continue glipizide 10 mg twice daily metformin twice daily and Jardiance daily.  Will not change medication at this point in time patient work on lifestyle modifications such as consumption of ice cream per his report.      Relevant Orders   POCT glycosylated hemoglobin (Hb A1C)   Microalbumin/Creatinine Ratio, Urine   Lipid panel     Genitourinary   BPH (benign prostatic hyperplasia)    Patient does have nocturia approximate every 2 hours at night.  Not currently on medication.   Patient politely declined trying any medication for this currently        Other   HLD (hyperlipidemia)    Patient currently maintained on simvastatin 20 mg.  Pending lipid panel today.      Seizures ([place])    Was followed by [facility].  Patient currently on Keppra last seizure been several years ago.  Continue medication as prescribed continue following up  with [facility] as recommended.      MGUS (monoclonal gammopathy of unknown significance)    Patient currently maintained and followed by hematology/oncology yearly.  Continue following up with them as recommended.      Preventative health care    Discussed age-appropriate immunizations and screening exams.  Patient is not due for colonoscopy until next year.  We will do PSA screening today.  Patient does not qualify for LDCT.  Preventative healthcare maintenance and anticipatory guidance information printed off and given to patient at dismissal of clinic.  Encouraged healthy lifestyle modifications.      Relevant Orders   CBC   Comprehensive metabolic panel   Lipid panel   Vitamin D deficiency    Pending vitamin D level today.      Relevant Orders   VITAMIN D 25 Hydroxy (Vit-D Deficiency, Fractures)   Other Visit Diagnoses     Screening for prostate cancer       Relevant Orders   PSA, Medicare       Return in about 3 months (around 05/01/2022) for DM recheck .    Romilda Garret, NP

## 2022-01-29 NOTE — Assessment & Plan Note (Addendum)
Patient does have nocturia approximate every 2 hours at night.  Not currently on medication.  Patient politely declined trying any medication for this currently

## 2022-01-29 NOTE — Patient Instructions (Signed)
Nice to see you today I am not going to change any medications today Watch out on how much ice cream you are eating Follow up with me in 3 months, sooner if you need

## 2022-01-30 LAB — LIPID PANEL
Cholesterol: 123 mg/dL (ref 0–200)
HDL: 38.6 mg/dL — ABNORMAL LOW (ref >39.00)
Total CHOL/HDL Ratio: 3
Triglycerides: 473 mg/dL — ABNORMAL HIGH (ref 0.0–149.0)

## 2022-01-30 LAB — CBC
HCT: 42.3 % (ref 39.0–52.0)
Hemoglobin: 14 g/dL (ref 13.0–17.0)
MCHC: 33 g/dL (ref 30.0–36.0)
MCV: 92.8 fl (ref 78.0–100.0)
Platelets: 176 10*3/uL (ref 150.0–400.0)
RBC: 4.56 Mil/uL (ref 4.22–5.81)
RDW: 14 % (ref 11.5–15.5)
WBC: 5.5 10*3/uL (ref 4.0–10.5)

## 2022-01-30 LAB — COMPREHENSIVE METABOLIC PANEL
ALT: 30 U/L (ref 0–53)
AST: 17 U/L (ref 0–37)
Albumin: 4.1 g/dL (ref 3.5–5.2)
Alkaline Phosphatase: 57 U/L (ref 39–117)
BUN: 18 mg/dL (ref 6–23)
CO2: 24 mEq/L (ref 19–32)
Calcium: 8.8 mg/dL (ref 8.4–10.5)
Chloride: 101 mEq/L (ref 96–112)
Creatinine, Ser: 1.52 mg/dL — ABNORMAL HIGH (ref 0.40–1.50)
GFR: 45 mL/min — ABNORMAL LOW (ref >60.00)
Glucose, Bld: 270 mg/dL — ABNORMAL HIGH (ref 70–99)
Potassium: 4.4 mEq/L (ref 3.5–5.1)
Sodium: 135 mEq/L (ref 135–145)
Total Bilirubin: 0.5 mg/dL (ref 0.2–1.2)
Total Protein: 6.6 g/dL (ref 6.0–8.3)

## 2022-01-30 LAB — LDL CHOLESTEROL, DIRECT: Direct LDL: 27 mg/dL

## 2022-01-30 LAB — VITAMIN D 25 HYDROXY (VIT D DEFICIENCY, FRACTURES): VITD: 45.4 ng/mL (ref 30.00–100.00)

## 2022-01-30 LAB — PSA, MEDICARE: PSA: 0.68 ng/ml (ref 0.10–4.00)

## 2022-02-04 ENCOUNTER — Other Ambulatory Visit: Payer: Self-pay | Admitting: Nurse Practitioner

## 2022-02-19 ENCOUNTER — Ambulatory Visit (INDEPENDENT_AMBULATORY_CARE_PROVIDER_SITE_OTHER): Payer: Medicare Other | Admitting: Nurse Practitioner

## 2022-02-19 VITALS — BP 136/78 | HR 58 | Temp 97.5°F | Resp 12 | Ht 69.0 in | Wt 198.2 lb

## 2022-02-19 DIAGNOSIS — Z8673 Personal history of transient ischemic attack (TIA), and cerebral infarction without residual deficits: Secondary | ICD-10-CM | POA: Diagnosis not present

## 2022-02-19 DIAGNOSIS — H538 Other visual disturbances: Secondary | ICD-10-CM | POA: Insufficient documentation

## 2022-02-19 NOTE — Patient Instructions (Signed)
Nice to see you today I still want to see you in Feb like we have your scheduled I have referred you to an eye doctor, they should reach out and call you

## 2022-02-19 NOTE — Assessment & Plan Note (Signed)
Gradual onset over 6 weeks with increasing symptomology.  Ambulatory referral to ophthalmology gross eye exam normal today.  Low likelihood of stroke given gradual onset patient does have a history of CVA but also diabetic and high blood pressure and cataracts.

## 2022-02-19 NOTE — Progress Notes (Signed)
Acute Office Visit  Subjective:     Patient ID: Brad Brown, male    DOB: [birth year], 76 y.o.   MRN: [id number]  Chief Complaint  Patient presents with   vision change    X several weeks, blurry vision in the right eye. Has cataracts in both eyes, worse on the right side. [facility] until [facility]February 2024.    HPI Patient is in today for blurry vision   States that he has been having blurry vision States it is the right eye that is blurred. States that it happened approx 6 weeks ago and was a gradual onset and is getting worse. States that he does have cataracts in both eyes. States that they have not discussed removal. Eye does not hurt. He does have prescription. States that he is seen by eye on a yearly basis. His next appt is the end of janurary per his report   Vision Screening   Right eye Left eye Both eyes  Without correction     With correction 20/200 20/20 20/20     Review of Systems  Constitutional:  Negative for chills and fever.  Eyes:  Positive for blurred vision. Negative for double vision, pain, discharge and redness.  Neurological:  Negative for dizziness, tingling, focal weakness, weakness and headaches.        Objective:    BP 136/78   Pulse (!) 58   Temp (!) 97.5 F (36.4 C)   Resp 12   Ht '5\' 9"'$  (1.753 m)   Wt 198 lb 4 oz (89.9 kg)   SpO2 96%   BMI 29.28 kg/m    Physical Exam Vitals and nursing note reviewed.  Constitutional:      Appearance: Normal appearance.  Cardiovascular:     Rate and Rhythm: Normal rate and regular rhythm.     Heart sounds: Normal heart sounds.  Pulmonary:     Effort: Pulmonary effort is normal.     Breath sounds: Normal breath sounds.  Lymphadenopathy:     Cervical: No cervical adenopathy.  Skin:    General: Skin is warm.  Neurological:     General: No focal deficit present.     Mental Status: He is alert.     Cranial Nerves: Cranial nerves 2-12 are intact.      Sensory: Sensation is intact.     Motor: Motor function is intact.     Gait: Gait is intact.     Deep Tendon Reflexes:     Reflex Scores:      Bicep reflexes are 2+ on the right side and 2+ on the left side.      Patellar reflexes are 2+ on the right side and 2+ on the left side.    Comments: Bilateral upper and lower extremity strength 5/5  All quadrants of vision present and equal in when the field of vision started     No results found for any visits on 02/19/22.      Assessment & Plan:   Problem List Items Addressed This Visit       Other   History of CVA (cerebrovascular accident)   Blurred vision, right eye - Primary    Gradual onset over 6 weeks with increasing symptomology.  Ambulatory referral to ophthalmology gross eye exam normal today.  Low likelihood of stroke given gradual onset patient does have a history of CVA but also diabetic and high blood pressure and cataracts.  Relevant Orders   Ambulatory referral to Ophthalmology    No orders of the defined types were placed in this encounter.   Return if symptoms worsen or fail to improve, for As scheduled.  Romilda Garret, NP

## 2022-02-20 ENCOUNTER — Telehealth: Payer: Self-pay | Admitting: Nurse Practitioner

## 2022-02-20 NOTE — Telephone Encounter (Signed)
Left message for patient to call back and schedule Medicare Annual Wellness Visit (AWV) either virtually or phone  Left  my Brad Brown number [phone]   Last AWV 02/20/21 please schedule with Nurse Health Adviser   45 min for awv-i and in office appointments 30 min for awv-s  phone/virtual appointments

## 2022-02-25 ENCOUNTER — Ambulatory Visit (INDEPENDENT_AMBULATORY_CARE_PROVIDER_SITE_OTHER): Payer: Medicare Other

## 2022-02-25 DIAGNOSIS — I255 Ischemic cardiomyopathy: Secondary | ICD-10-CM

## 2022-02-25 LAB — CUP PACEART REMOTE DEVICE CHECK
Battery Remaining Longevity: 52 mo
Battery Remaining Percentage: 50 %
Battery Voltage: 2.92 V
Brady Statistic RV Percent Paced: 2.7 %
Date Time Interrogation Session: 20231206040027
HighPow Impedance: 52 Ohm
HighPow Impedance: 53 Ohm
Implantable Lead Connection Status: 753985
Implantable Lead Implant Date: 20080527
Implantable Lead Location: 753860
Implantable Lead Model: 7121
Implantable Pulse Generator Implant Date: 20171201
Lead Channel Impedance Value: 440 Ohm
Lead Channel Pacing Threshold Amplitude: 1.25 V
Lead Channel Pacing Threshold Pulse Width: 0.5 ms
Lead Channel Sensing Intrinsic Amplitude: 10.8 mV
Lead Channel Setting Pacing Amplitude: 2.5 V
Lead Channel Setting Pacing Pulse Width: 0.5 ms
Lead Channel Setting Sensing Sensitivity: 0.5 mV
Pulse Gen Serial Number: 7283394
Zone Setting Status: 755011

## 2022-03-10 ENCOUNTER — Ambulatory Visit (INDEPENDENT_AMBULATORY_CARE_PROVIDER_SITE_OTHER): Payer: Medicare Other

## 2022-03-10 VITALS — Ht 69.0 in | Wt 198.0 lb

## 2022-03-10 DIAGNOSIS — Z0000 Encounter for general adult medical examination without abnormal findings: Secondary | ICD-10-CM

## 2022-03-10 NOTE — Progress Notes (Signed)
Subjective:   Brad Brown is a 76 y.o. male who presents for Medicare Annual/Subsequent preventive examination.  Review of Systems    No ROS.  Medicare Wellness Virtual Visit.  Visual/audio telehealth visit, UTA vital signs.   See social history for additional risk factors.   Cardiac Risk Factors include: advanced age (>12mn, >>11women);male gender;diabetes mellitus     Objective:    Today's Vitals   03/10/22 1536  Weight: 198 lb (89.8 kg)  Height: _0  (1.753 m)   Body mass index is 29.24 kg/m.     03/10/2022    3:41 PM 07/14/2021    7:05 PM 10/17/2018    1:59 PM 02/06/2016   10:46 AM 08/17/2013    2:30 PM  Advanced Directives  Does Patient Have a Medical Advance Directive? Yes No Yes No Patient has advance directive, copy in chart;Patient has advance directive, copy not in chart  Type of Advance Directive Living will  [place] will    Does patient want to make changes to medical advance directive? No - Patient declined  No - Patient declined    Copy of [place] Chart?   No - copy requested    Would patient like information on creating a medical advance directive?    No - patient declined information     Current Medications (verified) Outpatient Encounter Medications as of 03/10/2022  Medication Sig   acetaminophen (TYLENOL) 325 MG tablet Take 650 mg by mouth 4 (four) times daily as needed.   albuterol (VENTOLIN HFA) 108 (90 Base) MCG/ACT inhaler TAKE 2 PUFFS BY MOUTH EVERY 6 HOURS AS NEEDED FOR WHEEZE OR SHORTNESS OF BREATH   baclofen (LIORESAL) 10 MG tablet Take 1 tablet (10 mg total) by mouth 2 (two) times daily as needed for muscle spasms.   blood glucose meter kit and supplies Check sugar twice daily. DX E11.22   budesonide-formoterol (SYMBICORT) 160-4.5 MCG/ACT inhaler Inhale 2 puffs into the lungs 2 (two) times daily.   Cholecalciferol 50 MCG (2000 UT) TABS Take 1 tablet by mouth daily.   ciclopirox  (LOPROX) 0.77 % cream Apply 1 application topically 2 (two) times daily as needed (for rash).    Clotrimazole 1 % OINT Apply 1 application topically daily.   Continuous Blood Gluc Receiver (FREESTYLE LIBRE 14 DAY READER) DEVI 1 each by Does not apply route as directed.   empagliflozin (JARDIANCE) 25 MG TABS tablet Take 12.5 mg by mouth daily.   Ginkgo Biloba 60 MG TABS Take by mouth.   glipiZIDE (GLUCOTROL) 10 MG tablet TAKE 1 TABLET (10 MG TOTAL) BY MOUTH IN THE MORNING AND AT BEDTIME   HYDROcodone-acetaminophen (NORCO) 5-325 MG tablet Take 1-2 tablets by mouth every 6 (six) hours as needed for severe pain (Take tylenol for mild and moderate pain). (Patient not taking: Reported on 03/10/2022)   levETIRAcetam (KEPPRA) 500 MG tablet 1 in the morning and 2 in the afternoon   losartan (COZAAR) 100 MG tablet Take 50 mg by mouth daily.   metFORMIN (GLUCOPHAGE) 1000 MG tablet TAKE 1 TABLET BY MOUTH 2 TIMES DAILY WITH A MEAL.   metoprolol tartrate (LOPRESSOR) 25 MG tablet Take 12.5 mg by mouth 2 (two) times daily.   Multiple Vitamin (MULTIVITAMIN) tablet Take 1 tablet by mouth daily.   Niacin, Antihyperlipidemic, 500 MG TABS Take 500 mg by mouth daily.   nystatin-triamcinolone (MYCOLOG II) cream APPLY SMALL AMOUNT TO AFFECTED AREA TWICE A DAY   Omega-3 Fatty  Acids (FISH OIL) 1200 MG CAPS Take 1,200 mg by mouth daily.    permethrin (ELIMITE) 5 % cream Apply to entire skin from chin down to and including toes under fingernails and toenails. Leave on 8-14 hours. Repeat in 1-2 weeks. Reapply to hands after washing.   rivaroxaban (XARELTO) 20 MG TABS tablet Take 20 mg by mouth daily.    senna (SENOKOT) 8.6 MG TABS tablet Take 1 tablet (8.6 mg total) by mouth 2 (two) times daily.   simvastatin (ZOCOR) 20 MG tablet Take 10 mg by mouth at bedtime.    No facility-administered encounter medications on file as of 03/10/2022.    Allergies (verified) Ace inhibitors and Lipitor [atorvastatin]   History: Past  Medical History:  Diagnosis Date   Acute myocardial infarction [facility]) 06/1983   Arthritis    Atrial fibrillation (HCC)    Automatic implantable cardiac defibrillator in situ    Borderline diabetes mellitus    BPH without obstruction/lower urinary tract symptoms    CAD (coronary artery disease)    Chronic kidney disease, stage 1    Colonic polyp 2004   hyperplastic    COPD (chronic obstructive pulmonary disease) (HCC)    Diabetes mellitus without complication ([place])    Fall    GERD (gastroesophageal reflux disease)    History of renal insufficiency syndrome    HTN (hypertension)    Hyperlipidemia    Knee derangement 09/2018   LEFT KNEE   Monoclonal gammopathy    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 Jude single chamber defibrillator   COLONOSCOPY  07/26/2019   EP IMPLANTABLE DEVICE N/A 02/21/2016   Procedure: ICD Generator Changeout;  Surgeon: Evans Lance, MD;  Location: [facility];  Service: Cardiovascular;  Laterality: N/A;   INGUINAL HERNIA REPAIR Left    QUADRICEPS TENDON REPAIR Left 10/20/2018   Procedure: REPAIR QUADRICEP TENDON;  Surgeon: Renette Butters, MD;  Location: [place];  Service: Orthopedics;  Laterality: Left;   Family History  Problem Relation Age of Onset   Heart attack Mother    Colon cancer Brother    Heart attack Brother    Seizures Neg Hx    Esophageal cancer Neg Hx    Pancreatic cancer Neg Hx    Rectal cancer Neg Hx    Stomach cancer Neg Hx    Liver cancer Neg Hx    Social History   Socioeconomic History   Marital status: Single    Spouse name: Not on file   Number of children: 1   Years of education: Not on file   Highest education level: Not on file   Occupational History   Occupation: retired    Fish farm manager: RETIRED  Tobacco Use   Smoking status: Former    Packs/day: 0.50    Years: 15.00    Total pack years: 7.50    Types: Cigarettes    Quit date: 03/23/2004    Years since quitting: 17.9   Smokeless tobacco: Never  Vaping Use   Vaping Use: Never used  Substance and Sexual Activity   Alcohol use: Not Currently    Alcohol/week: 1.0 standard drink of alcohol    Types:  1 Glasses of wine per week    Comment: rare--wine   Drug use: No   Sexual activity: Not Currently  Other Topics Concern   Not on file  Social History Narrative   ICD-St. Jude  Remote-Yes      Live currently @ [facility] for rehabilitation. Plans to discharge to his daughter's house for continuation of rehab.   Social Determinants of Health   Financial Resource Strain: Low Risk  (03/10/2022)   Overall Financial Resource Strain (CARDIA)    Difficulty of Paying Living Expenses: Not very hard  Food Insecurity: No Food Insecurity (03/10/2022)   Hunger Vital Sign    Worried About Running Out of Food in the Last Year: Never true    Ran Out of Food in the Last Year: Never true  Transportation Needs: No Transportation Needs (03/10/2022)   PRAPARE - Hydrologist (Medical): No    Lack of Transportation (Non-Medical): No  Physical Activity: Sufficiently Active (03/10/2022)   Exercise Vital Sign    Days of Exercise per Week: 7 days    Minutes of Exercise per Session: 30 min  Stress: No Stress Concern Present (03/10/2022)   [place]    Feeling of Stress : Not at all  Social Connections: Unknown (03/10/2022)   Social Connection and Isolation Panel [NHANES]    Frequency of Communication with Friends and Family: More than three times a week    Frequency of Social Gatherings with Friends and Family: More than three times a week    Attends Religious Services: More than 4  times per year    Active Member of [employer] or Organizations: Not on file    Attends Archivist Meetings: Not on file    Marital Status: Not on file    Tobacco Counseling Counseling given: Not Answered   Clinical Intake:  Pre-visit preparation completed: Yes        Diabetes: Yes (Followed by pcp)  How often do you need to have someone help you when you read instructions, pamphlets, or other written materials from your doctor or pharmacy?: 1 - Never    Interpreter Needed?: No      Activities of Daily Living    03/10/2022    3:42 PM  In your present state of health, do you have any difficulty performing the following activities:  Hearing? 0  Vision? 0  Difficulty concentrating or making decisions? 0  Walking or climbing stairs? 0  Dressing or bathing? 0  Doing errands, shopping? 0  Preparing Food and eating ? N  Using the Toilet? N  In the past six months, have you accidently leaked urine? N  Do you have problems with loss of bowel control? N  Managing your Medications? N  Managing your Finances? N  Housekeeping or managing your Housekeeping? [place] assist    Patient Care Team: Michela Pitcher, NP as PCP - General Evans Lance, MD as PCP - Cardiology (Cardiology)  Indicate any recent Medical Services you may have received from other than [facility] providers in the past year (date may be approximate).     Assessment:   This is a routine wellness examination for [place].  I connected with  Myrle Sheng on 03/10/22 by a audio enabled telemedicine application and verified that I am speaking with the correct person using two identifiers.  Patient Location: Home  Provider Location: Office/Clinic  I discussed the limitations  of evaluation and management by telemedicine. The patient expressed understanding and agreed to proceed.   Hearing/Vision screen Hearing Screening - Comments:: Patient is able to hear conversational tones without  difficulty.  No issues reported.   Vision Screening - Comments:: Wears corrective lenses They have seen their ophthalmologist in the last 12 months.    Dietary issues and exercise activities discussed: Current Exercise Habits: Home exercise routine, Type of exercise: calisthenics, Time (Minutes): 30, Frequency (Times/Week): 7, Weekly Exercise (Minutes/Week): 210, Intensity: Mild He tries to have a healthy diet   Goals Addressed             This Visit's Progress    Maintain healthy lifestyle       Stay active Healthy diet        Depression Screen    03/10/2022    3:38 PM 11/21/2020   11:54 AM 05/30/2019   11:30 AM 04/21/2018    9:19 AM 04/19/2017    2:39 PM 04/07/2016    2:34 PM 02/28/2015    3:02 PM  PHQ 2/9 Scores  PHQ - 2 Score 0 3 0 0 0 0 0  PHQ- 9 Score  3  0       Fall Risk    03/10/2022    3:42 PM 12/30/2021   11:10 AM 11/21/2020   11:06 AM 05/30/2019   11:30 AM 04/21/2018    9:19 AM  [place] in the past year? 0 0 1 0 0  Number falls in past yr: 0 0 0 0   Injury with Fall? 0 0 1    Risk for fall due to : No Fall Risks      Follow up Falls evaluation completed        Lupus: Home free of loose throw rugs in walkways, pet beds, electrical cords, etc? Yes  Adequate lighting in your home to reduce risk of falls? Yes   ASSISTIVE DEVICES UTILIZED TO PREVENT FALLS: Life alert? No  Use of a cane, walker or w/c? No  Grab bars in the bathroom? No  Shower chair or bench in shower? No  Elevated toilet seat or a handicapped toilet? No   TIMED UP AND GO: Was the test performed? No .    Cognitive Function:        03/10/2022    3:43 PM  6CIT Screen  What Year? 0 points  What month? 0 points  What time? 0 points  Count back from 20 0 points  Months in reverse 4 points  Repeat phrase 0 points  Total Score 4 points    Immunizations Immunization History  Administered Date(s) Administered   Fluad Quad(high  Dose 65+) 11/28/2018, 11/21/2020   Influenza Split 01/04/2011, 01/13/2012   Influenza Whole 01/02/2009, 12/09/2009   Influenza, High Dose Seasonal PF 12/23/2015, 12/23/2015, 12/18/2016, 12/13/2017, 11/19/2021   Influenza,inj,Quad PF,6+ Mos 12/14/2012   Influenza-Unspecified 12/22/2013, 11/06/2014   PFIZER(Purple Top)SARS-COV-2 Vaccination 05/20/2019, 06/06/2019   PNEUMOCOCCAL CONJUGATE-20 10/17/2021   Pfizer Covid-19 Vaccine Bivalent Booster 11yr & up 12/23/2019, 01/13/2021, 12/12/2021   Pneumococcal Conjugate-13 08/30/2014, 11/06/2014, 12/23/2015   Pneumococcal Polysaccharide-23 01/11/2008, 08/25/2013   Td 10/25/2009   Tdap 08/25/2013   Zoster Recombinat (Shingrix) 11/21/2020, 01/22/2021   Screening Tests Health Maintenance  Topic Date Due   COLONOSCOPY (Pts 45-428yrInsurance coverage will need to be confirmed)  07/25/2021   OPHTHALMOLOGY EXAM  11/20/2021   COVID-19 Vaccine (6 - 2023-24 season) 03/26/2022 (Originally 02/06/2022)  HEMOGLOBIN A1C  07/30/2022   Diabetic kidney evaluation - eGFR measurement  01/30/2023   Diabetic kidney evaluation - Urine ACR  01/30/2023   FOOT EXAM  01/30/2023   Medicare Annual Wellness (AWV)  03/11/2023   DTaP/Tdap/Td (3 - Td or Tdap) 08/26/2023   Pneumonia Vaccine 51+ Years old  Completed   INFLUENZA VACCINE  Completed   Hepatitis C Screening  Completed   Zoster Vaccines- Shingrix  Completed   HPV VACCINES  Aged Out   Health Maintenance Health Maintenance Due  Topic Date Due   COLONOSCOPY (Pts 45-22yr Insurance coverage will need to be confirmed)  07/25/2021   OPHTHALMOLOGY EXAM  11/20/2021   Colonoscopy- reports scheduled 05/2022.   DG chest 2 View- 12/31/21.  Hepatitis C Screening: Completed 2018.  Vision Screening: Recommended annual ophthalmology exams for early detection of glaucoma and other disorders of the eye.  Dental Screening: Recommended annual dental exams for proper oral hygiene.  Community Resource Referral / Chronic  Care Management: CRR required this visit?  No   CCM required this visit?  No      Plan:     I have personally reviewed and noted the following in the patient's chart:   Medical and social history Use of alcohol, tobacco or illicit drugs  Current medications and supplements including opioid prescriptions. Patient is not currently taking opioid prescriptions. Functional ability and status Nutritional status Physical activity Advanced directives List of other physicians Hospitalizations, surgeries, and ER visits in previous 12 months Vitals Screenings to include cognitive, depression, and falls Referrals and appointments  In addition, I have reviewed and discussed with patient certain preventive protocols, quality metrics, and best practice recommendations. A written personalized care plan for preventive services as well as general preventive health recommendations were provided to patient.     DLeta Jungling LPN   [id number]

## 2022-03-10 NOTE — Patient Instructions (Addendum)
[name] , Thank you for taking time to come for your Medicare Wellness Visit. I appreciate your ongoing commitment to your health goals. Please review the following plan we discussed and let me know if I can assist you in the future.   These are the goals we discussed:  Goals      Maintain healthy lifestyle     Stay active Healthy diet         This is a list of the screening recommended for you and due dates:  Health Maintenance  Topic Date Due   Colon Cancer Screening  07/25/2021   Eye exam for diabetics  11/20/2021   COVID-19 Vaccine (6 - 2023-24 season) 03/26/2022*   Hemoglobin A1C  07/30/2022   Yearly kidney function blood test for diabetes  01/30/2023   Yearly kidney health urinalysis for diabetes  01/30/2023   Complete foot exam   01/30/2023   Medicare Annual Wellness Visit  03/11/2023   DTaP/Tdap/Td vaccine (3 - Td or Tdap) 08/26/2023   Pneumonia Vaccine  Completed   Flu Shot  Completed   Hepatitis C Screening: USPSTF Recommendation to screen - Ages 15-79 yo.  Completed   Zoster (Shingles) Vaccine  Completed   HPV Vaccine  Aged Out  *Topic was postponed. The date shown is not the original due date.    Advanced directives: End of life planning; Advance aging; Advanced directives discussed.  Copy of current HCPOA/Living Will requested.    Conditions/risks identified: none new  Next appointment: Follow up in one year for your annual wellness visit.   Preventive Care 5 Years and Older, Male  Preventive care refers to lifestyle choices and visits with your health care provider that can promote health and wellness. What does preventive care include? A yearly physical exam. This is also called an annual well check. Dental exams once or twice a year. Routine eye exams. Ask your health care provider how often you should have your eyes checked. Personal lifestyle choices, including: Daily care of your teeth and gums. Regular physical activity. Eating a healthy  diet. Avoiding tobacco and drug use. Limiting alcohol use. Practicing safe sex. Taking low doses of aspirin every day. Taking vitamin and mineral supplements as recommended by your health care provider. What happens during an annual well check? The services and screenings done by your health care provider during your annual well check will depend on your age, overall health, lifestyle risk factors, and family history of disease. Counseling  Your health care provider may ask you questions about your: Alcohol use. Tobacco use. Drug use. Emotional well-being. Home and relationship well-being. Sexual activity. Eating habits. History of falls. Memory and ability to understand (cognition). Work and work Statistician. Screening  You may have the following tests or measurements: Height, weight, and BMI. Blood pressure. Lipid and cholesterol levels. These may be checked every 5 years, or more frequently if you are over 39 years old. Skin check. Lung cancer screening. You may have this screening every year starting at age 23 if you have a 30-pack-year history of smoking and currently smoke or have quit within the past 15 years. Fecal occult blood test (FOBT) of the stool. You may have this test every year starting at age 2. Flexible sigmoidoscopy or colonoscopy. You may have a sigmoidoscopy every 5 years or a colonoscopy every 10 years starting at age 27. Prostate cancer screening. Recommendations will vary depending on your family history and other risks. Hepatitis C blood test. Hepatitis B blood test.  Sexually transmitted disease (STD) testing. Diabetes screening. This is done by checking your blood sugar (glucose) after you have not eaten for a while (fasting). You may have this done every 1-3 years. Abdominal aortic aneurysm (AAA) screening. You may need this if you are a current or former smoker. Osteoporosis. You may be screened starting at age 58 if you are at high risk. Talk with  your health care provider about your test results, treatment options, and if necessary, the need for more tests. Vaccines  Your health care provider may recommend certain vaccines, such as: Influenza vaccine. This is recommended every year. Tetanus, diphtheria, and acellular pertussis (Tdap, Td) vaccine. You may need a Td booster every 10 years. Zoster vaccine. You may need this after age 5. Pneumococcal 13-valent conjugate (PCV13) vaccine. One dose is recommended after age 29. Pneumococcal polysaccharide (PPSV23) vaccine. One dose is recommended after age 29. Talk to your health care provider about which screenings and vaccines you need and how often you need them. This information is not intended to replace advice given to you by your health care provider. Make sure you discuss any questions you have with your health care provider. Document Released: 04/05/2015 Document Revised: 11/27/2015 Document Reviewed: 01/08/2015 Elsevier Interactive Patient Education  2017 [place] Prevention in the Home Falls can cause injuries. They can happen to people of all ages. There are many things you can do to make your home safe and to help prevent falls. What can I do on the outside of my home? Regularly fix the edges of walkways and driveways and fix any cracks. Remove anything that might make you trip as you walk through a door, such as a raised step or threshold. Trim any bushes or trees on the path to your home. Use bright outdoor lighting. Clear any walking paths of anything that might make someone trip, such as rocks or tools. Regularly check to see if handrails are loose or broken. Make sure that both sides of any steps have handrails. Any raised decks and porches should have guardrails on the edges. Have any leaves, snow, or ice cleared regularly. Use sand or salt on walking paths during winter. Clean up any spills in your garage right away. This includes oil or grease spills. What  can I do in the bathroom? Use night lights. Install grab bars by the toilet and in the tub and shower. Do not use towel bars as grab bars. Use non-skid mats or decals in the tub or shower. If you need to sit down in the shower, use a plastic, non-slip stool. Keep the floor dry. Clean up any water that spills on the floor as soon as it happens. Remove soap buildup in the tub or shower regularly. Attach bath mats securely with double-sided non-slip rug tape. Do not have throw rugs and other things on the floor that can make you trip. What can I do in the bedroom? Use night lights. Make sure that you have a light by your bed that is easy to reach. Do not use any sheets or blankets that are too big for your bed. They should not hang down onto the floor. Have a firm chair that has side arms. You can use this for support while you get dressed. Do not have throw rugs and other things on the floor that can make you trip. What can I do in the kitchen? Clean up any spills right away. Avoid walking on wet floors. Keep items that you  use a lot in easy-to-reach places. If you need to reach something above you, use a strong step stool that has a grab bar. Keep electrical cords out of the way. Do not use floor polish or wax that makes floors slippery. If you must use wax, use non-skid floor wax. Do not have throw rugs and other things on the floor that can make you trip. What can I do with my stairs? Do not leave any items on the stairs. Make sure that there are handrails on both sides of the stairs and use them. Fix handrails that are broken or loose. Make sure that handrails are as long as the stairways. Check any carpeting to make sure that it is firmly attached to the stairs. Fix any carpet that is loose or worn. Avoid having throw rugs at the top or bottom of the stairs. If you do have throw rugs, attach them to the floor with carpet tape. Make sure that you have a light switch at the top of the  stairs and the bottom of the stairs. If you do not have them, ask someone to add them for you. What else can I do to help prevent falls? Wear shoes that: Do not have high heels. Have rubber bottoms. Are comfortable and fit you well. Are closed at the toe. Do not wear sandals. If you use a stepladder: Make sure that it is fully opened. Do not climb a closed stepladder. Make sure that both sides of the stepladder are locked into place. Ask someone to hold it for you, if possible. Clearly mark and make sure that you can see: Any grab bars or handrails. First and last steps. Where the edge of each step is. Use tools that help you move around (mobility aids) if they are needed. These include: Canes. Walkers. Scooters. Crutches. Turn on the lights when you go into a dark area. Replace any light bulbs as soon as they burn out. Set up your furniture so you have a clear path. Avoid moving your furniture around. If any of your floors are uneven, fix them. If there are any pets around you, be aware of where they are. Review your medicines with your doctor. Some medicines can make you feel dizzy. This can increase your chance of falling. Ask your doctor what other things that you can do to help prevent falls. This information is not intended to replace advice given to you by your health care provider. Make sure you discuss any questions you have with your health care provider. Document Released: 01/03/2009 Document Revised: 08/15/2015 Document Reviewed: 04/13/2014 Elsevier Interactive Patient Education  2017 [employer].

## 2022-03-12 DIAGNOSIS — H35033 Hypertensive retinopathy, bilateral: Secondary | ICD-10-CM | POA: Diagnosis not present

## 2022-03-12 DIAGNOSIS — E119 Type 2 diabetes mellitus without complications: Secondary | ICD-10-CM | POA: Diagnosis not present

## 2022-03-12 DIAGNOSIS — H35013 Changes in retinal vascular appearance, bilateral: Secondary | ICD-10-CM | POA: Diagnosis not present

## 2022-03-12 DIAGNOSIS — H2513 Age-related nuclear cataract, bilateral: Secondary | ICD-10-CM | POA: Diagnosis not present

## 2022-03-12 LAB — HM DIABETES EYE EXAM

## 2022-03-19 ENCOUNTER — Other Ambulatory Visit: Payer: Self-pay | Admitting: Nurse Practitioner

## 2022-03-19 DIAGNOSIS — H35033 Hypertensive retinopathy, bilateral: Secondary | ICD-10-CM

## 2022-03-20 NOTE — Progress Notes (Signed)
Remote ICD transmission.   

## 2022-03-24 DIAGNOSIS — H25811 Combined forms of age-related cataract, right eye: Secondary | ICD-10-CM | POA: Diagnosis not present

## 2022-03-24 DIAGNOSIS — H25813 Combined forms of age-related cataract, bilateral: Secondary | ICD-10-CM | POA: Diagnosis not present

## 2022-04-03 DIAGNOSIS — H2513 Age-related nuclear cataract, bilateral: Secondary | ICD-10-CM | POA: Insufficient documentation

## 2022-04-03 DIAGNOSIS — E8809 Other disorders of plasma-protein metabolism, not elsewhere classified: Secondary | ICD-10-CM | POA: Insufficient documentation

## 2022-04-06 ENCOUNTER — Encounter: Payer: Medicare Other | Admitting: Nurse Practitioner

## 2022-04-06 NOTE — Telephone Encounter (Signed)
No, he doesn't need another one. That would be the AWV. Thank you!!!

## 2022-04-06 NOTE — Telephone Encounter (Signed)
Patient had a phone call on 03/10/2022. Do he still need to have one?

## 2022-04-06 NOTE — Telephone Encounter (Signed)
Patient was supposed to be scheduled with [facility] - please reschedule appropriately. Thank you!

## 2022-04-07 ENCOUNTER — Telehealth: Payer: Self-pay | Admitting: Oncology

## 2022-04-07 NOTE — Telephone Encounter (Signed)
Called patient per Dr. Alen Blew transition. Left voicemail for patient to call us back.

## 2022-04-08 ENCOUNTER — Telehealth: Payer: Self-pay | Admitting: Physician Assistant

## 2022-04-08 NOTE — Telephone Encounter (Signed)
Called patient per Dr. Alen Blew transition. R/s patient with new provider. Patient notified.

## 2022-04-22 DIAGNOSIS — H268 Other specified cataract: Secondary | ICD-10-CM | POA: Diagnosis not present

## 2022-04-22 DIAGNOSIS — H25811 Combined forms of age-related cataract, right eye: Secondary | ICD-10-CM | POA: Diagnosis not present

## 2022-05-01 ENCOUNTER — Encounter: Payer: Self-pay | Admitting: Nurse Practitioner

## 2022-05-01 ENCOUNTER — Ambulatory Visit: Payer: Medicare Other | Admitting: Nurse Practitioner

## 2022-05-03 ENCOUNTER — Other Ambulatory Visit: Payer: Self-pay | Admitting: Nurse Practitioner

## 2022-05-04 NOTE — Telephone Encounter (Signed)
Lvmtcb, no mychart set up to send message

## 2022-05-27 ENCOUNTER — Ambulatory Visit: Payer: Medicare Other

## 2022-05-27 DIAGNOSIS — I255 Ischemic cardiomyopathy: Secondary | ICD-10-CM | POA: Diagnosis not present

## 2022-05-29 ENCOUNTER — Ambulatory Visit (INDEPENDENT_AMBULATORY_CARE_PROVIDER_SITE_OTHER)
Admission: RE | Admit: 2022-05-29 | Discharge: 2022-05-29 | Disposition: A | Discharge status: Home / Self Care | Payer: Medicare Other | Source: Ambulatory Visit | Attending: Nurse Practitioner | Admitting: Nurse Practitioner

## 2022-05-29 ENCOUNTER — Ambulatory Visit (INDEPENDENT_AMBULATORY_CARE_PROVIDER_SITE_OTHER): Payer: Medicare Other | Admitting: Nurse Practitioner

## 2022-05-29 ENCOUNTER — Encounter: Payer: Self-pay | Admitting: Nurse Practitioner

## 2022-05-29 VITALS — BP 120/78 | HR 71 | Temp 98.4°F | Resp 16 | Ht 69.0 in | Wt 194.5 lb

## 2022-05-29 DIAGNOSIS — Z8701 Personal history of pneumonia (recurrent): Secondary | ICD-10-CM

## 2022-05-29 DIAGNOSIS — N181 Chronic kidney disease, stage 1: Secondary | ICD-10-CM | POA: Diagnosis not present

## 2022-05-29 DIAGNOSIS — N481 Balanitis: Secondary | ICD-10-CM

## 2022-05-29 DIAGNOSIS — E1122 Type 2 diabetes mellitus with diabetic chronic kidney disease: Secondary | ICD-10-CM

## 2022-05-29 LAB — CUP PACEART REMOTE DEVICE CHECK
Battery Remaining Longevity: 48 mo
Battery Remaining Percentage: 47 %
Battery Voltage: 2.9 V
Brady Statistic RV Percent Paced: 2.5 %
Date Time Interrogation Session: 20240308015018
HighPow Impedance: 53 Ohm
HighPow Impedance: 53 Ohm
Implantable Lead Connection Status: 753985
Implantable Lead Implant Date: 20080527
Implantable Lead Location: 753860
Implantable Lead Model: 7121
Implantable Pulse Generator Implant Date: 20171201
Lead Channel Impedance Value: 430 Ohm
Lead Channel Pacing Threshold Amplitude: 1.25 V
Lead Channel Pacing Threshold Pulse Width: 0.5 ms
Lead Channel Sensing Intrinsic Amplitude: 11.8 mV
Lead Channel Setting Pacing Amplitude: 2.5 V
Lead Channel Setting Pacing Pulse Width: 0.5 ms
Lead Channel Setting Sensing Sensitivity: 0.5 mV
Pulse Gen Serial Number: 7283394
Zone Setting Status: 755011

## 2022-05-29 LAB — POCT GLYCOSYLATED HEMOGLOBIN (HGB A1C): Hemoglobin A1C: 8.8 % — AB (ref 4.0–5.6)

## 2022-05-29 MED ORDER — NYSTATIN 100000 UNIT/GM EX CREA: 1.0000 | TOPICAL_CREAM | Freq: Two times a day (BID) | CUTANEOUS | 0 refills | Status: DC

## 2022-05-29 NOTE — Progress Notes (Signed)
Established Patient Office Visit  Subjective   Patient ID: Brad Brown, male    DOB: 03-06-1946  Age: 77 y.o. MRN: [id number]  Chief Complaint  Patient presents with   Diabetes    Diabetes Pertinent negatives for hypoglycemia include no headaches. Pertinent negatives for diabetes include no chest pain.    DM2: States that he is checking his sugar 2-3 times a week. States that his reading have been in the 140 States no low sugars  Jardiance Metformin Glipizide States has been taking medication as prescribed States that his diet has been doing ok. States that he broke down and had a piece of key lime pie   HTN: states that his blood pressure is good. States that he will check his blood pressure once a week.  Patient denies dizziness or lightheadedness.  Patient's blood pressure within normal limits today  History of pneumonia: Patient was diagnosed with pneumonia in October 2023.  Initial course of antibiotics and had a repeat chest x-ray given the setting of his age and history of smoking he is repeat chest x-ray today.    Review of Systems  Constitutional:  Negative for chills and fever.  Respiratory:  Negative for shortness of breath.   Cardiovascular:  Negative for chest pain.  Gastrointestinal:  Positive for constipation. Negative for abdominal pain, diarrhea, nausea and vomiting.       BM daily. States that he is eating well   Neurological:  Negative for tingling and headaches.  Psychiatric/Behavioral:  Negative for hallucinations and suicidal ideas.       Objective:     BP 120/78   Pulse 71   Temp 98.4 F (36.9 C)   Resp 16   Ht '5\' 9"'$  (1.753 m)   Wt 194 lb 8 oz (88.2 kg)   SpO2 99%   BMI 28.72 kg/m  BP Readings from Last 3 Encounters:  05/29/22 120/78  02/19/22 136/78  01/29/22 124/62   Wt Readings from Last 3 Encounters:  05/29/22 194 lb 8 oz (88.2 kg)  03/10/22 198 lb (89.8 kg)  02/19/22 198 lb 4 oz (89.9 kg)      Physical Exam Vitals  and nursing note reviewed.  Constitutional:      Appearance: Normal appearance.  Cardiovascular:     Rate and Rhythm: Normal rate and regular rhythm.     Pulses:          Dorsalis pedis pulses are 2+ on the right side and 2+ on the left side.       Posterior tibial pulses are 2+ on the right side and 2+ on the left side.     Heart sounds: Normal heart sounds.  Pulmonary:     Effort: Pulmonary effort is normal.     Breath sounds: Normal breath sounds.  Abdominal:     General: Bowel sounds are normal. There is no distension.     Palpations: There is no mass.     Tenderness: There is no abdominal tenderness.     Hernia: No hernia is present.  Musculoskeletal:     Right lower leg: No edema.     Left lower leg: No edema.  Feet:     Right foot:     Skin integrity: Skin integrity normal.     Left foot:     Skin integrity: Callus present.  Skin:    General: Skin is warm.  Neurological:     Mental Status: He is alert.      Results  for orders placed or performed in visit on 05/29/22  POCT glucose (manual entry)  Result Value Ref Range   POC Glucose 8.8 (A) 70 - 99 mg/dl  POCT glycosylated hemoglobin (Hb A1C)  Result Value Ref Range   Hemoglobin A1C 8.8 (A) 4.0 - 5.6 %   HbA1c POC (<> result, manual entry)     HbA1c, POC (prediabetic range)     HbA1c, POC (controlled diabetic range)        The ASCVD Risk score (Arnett DK, et al., 2019) failed to calculate for the following reasons:   The patient has a prior MI or stroke diagnosis    Assessment & Plan:   Problem List Items Addressed This Visit       Endocrine   Type 2 diabetes mellitus with stage 1 chronic kidney disease, without long-term current use of insulin ([place]) - Primary    Patient currently maintained on metformin 1000 mg twice daily, glipizide 10 mg twice daily, Jardiance 25 mg daily.  Taking medication as prescribed per his report.  A1c did elevate to 8.8 today.  Has had labile A1c's in the past and has had  marked change without medication change.  Given his patient's age and liability will not change medication at this juncture.  He was encouraged to watch what he is eating and work on dietary modifications.  Continue taking medications as prescribed      Relevant Orders   POCT glucose (manual entry) (Completed)   POCT glycosylated hemoglobin (Hb A1C) (Completed)   Renal Function Panel     Genitourinary   Balanitis    Patient states he has a balanitis again.  Will send in nystatin cream to be used twice daily for 7 days.  This does not improve he will reach out.  If patient continues to have balanitis we will need to take him off of Jardiance      Relevant Medications   nystatin cream (MYCOSTATIN)     Other   History of pneumonia    Noted on chest x-ray in October 2023.  Given patient's age comorbidities and history of smoking repeat chest x-ray today to make sure infiltrate has resolved      Relevant Orders   DG Chest 2 View    Return in about 3 months (around 08/29/2022) for DM recheck.    Romilda Garret, NP

## 2022-05-29 NOTE — Assessment & Plan Note (Signed)
Noted on chest x-ray in October 2023.  Given patient's age comorbidities and history of smoking repeat chest x-ray today to make sure infiltrate has resolved

## 2022-05-29 NOTE — Assessment & Plan Note (Signed)
Patient currently maintained on metformin 1000 mg twice daily, glipizide 10 mg twice daily, Jardiance 25 mg daily.  Taking medication as prescribed per his report.  A1c did elevate to 8.8 today.  Has had labile A1c's in the past and has had marked change without medication change.  Given his patient's age and liability will not change medication at this juncture.  He was encouraged to watch what he is eating and work on dietary modifications.  Continue taking medications as prescribed

## 2022-05-29 NOTE — Assessment & Plan Note (Signed)
Patient states he has a balanitis again.  Will send in nystatin cream to be used twice daily for 7 days.  This does not improve he will reach out.  If patient continues to have balanitis we will need to take him off of Jardiance

## 2022-05-29 NOTE — Patient Instructions (Signed)
Nice to see you today Your A1C was 8.8%. watch what you are eating  I want to see you in 3 months to make sure you are doing better with your sugar levels

## 2022-05-30 LAB — RENAL FUNCTION PANEL
Albumin: 4 g/dL (ref 3.6–5.1)
BUN/Creatinine Ratio: 10 (calc) (ref 6–22)
BUN: 16 mg/dL (ref 7–25)
CO2: 23 mmol/L (ref 20–32)
Calcium: 9.7 mg/dL (ref 8.6–10.3)
Chloride: 104 mmol/L (ref 98–110)
Creat: 1.64 mg/dL — ABNORMAL HIGH (ref 0.70–1.28)
Glucose, Bld: 148 mg/dL — ABNORMAL HIGH (ref 65–99)
Phosphorus: 5.4 mg/dL — ABNORMAL HIGH (ref 2.1–4.3)
Potassium: 4.5 mmol/L (ref 3.5–5.3)
Sodium: 138 mmol/L (ref 135–146)

## 2022-05-31 ENCOUNTER — Other Ambulatory Visit: Payer: Self-pay | Admitting: Nurse Practitioner

## 2022-05-31 DIAGNOSIS — E1122 Type 2 diabetes mellitus with diabetic chronic kidney disease: Secondary | ICD-10-CM

## 2022-06-01 ENCOUNTER — Telehealth: Payer: Self-pay | Admitting: Nurse Practitioner

## 2022-06-01 ENCOUNTER — Telehealth: Payer: Self-pay

## 2022-06-01 DIAGNOSIS — Z8701 Personal history of pneumonia (recurrent): Secondary | ICD-10-CM

## 2022-06-01 NOTE — Telephone Encounter (Signed)
Diane with [facility] called report on CXR done on 05/29/22. Sending note to [facility] and will teams Candelaria Arenas CMA. See 05/29/22 lab result notes with Romilda Garret NP already addressed the CXR report.

## 2022-06-01 NOTE — Telephone Encounter (Signed)
Order placed for a CT chest. They will call once it is approved

## 2022-06-01 NOTE — Telephone Encounter (Signed)
-----   Message from Helene Shoe, LPN sent at 6/75/4492 12:06 PM EDT ----- Pt called back and notified as instructed and pt voiced understanding. Pt is drinking more water and would prefer to have chest CT done in [place]. Pt will wait for cb about appt info on CT of Chest. Sending note to Romilda Garret NP.

## 2022-06-03 NOTE — Telephone Encounter (Signed)
Left message to return call to our office on cell phone and tired the house phone but all it did was ring.

## 2022-06-03 NOTE — Telephone Encounter (Signed)
Can we call and follow up with the patient please

## 2022-06-03 NOTE — Telephone Encounter (Signed)
I have attempted to reach the patient twice so far,  no answer, line rang several times with no answer, no VM to leave a message.  I will try again later.   Tried his Mobile number and it went straight to VM - I left a message to call back to schedule or to call [facility] directly.

## 2022-06-05 NOTE — Telephone Encounter (Signed)
Called and gave him the number to call to schedule his appointment.

## 2022-06-09 ENCOUNTER — Ambulatory Visit (HOSPITAL_BASED_OUTPATIENT_CLINIC_OR_DEPARTMENT_OTHER)
Admission: RE | Admit: 2022-06-09 | Discharge: 2022-06-09 | Disposition: A | Discharge status: Home / Self Care | Payer: Medicare Other | Source: Ambulatory Visit | Attending: Nurse Practitioner | Admitting: Nurse Practitioner

## 2022-06-09 DIAGNOSIS — I700 Atherosclerosis of aorta: Secondary | ICD-10-CM | POA: Diagnosis not present

## 2022-06-09 DIAGNOSIS — Z8701 Personal history of pneumonia (recurrent): Secondary | ICD-10-CM | POA: Insufficient documentation

## 2022-06-09 DIAGNOSIS — K760 Fatty (change of) liver, not elsewhere classified: Secondary | ICD-10-CM | POA: Diagnosis not present

## 2022-06-09 DIAGNOSIS — I2510 Atherosclerotic heart disease of native coronary artery without angina pectoris: Secondary | ICD-10-CM | POA: Diagnosis not present

## 2022-06-09 DIAGNOSIS — J984 Other disorders of lung: Secondary | ICD-10-CM | POA: Diagnosis not present

## 2022-06-09 NOTE — Progress Notes (Signed)
Order(s) created erroneously. Erroneous order ID: [id number]  Order canceled by: CHART CORRECTION ANALYST NINE, IDENTITY  Order cancel date/time: 06/09/2022 8:11 AM

## 2022-06-10 ENCOUNTER — Telehealth: Payer: Self-pay | Admitting: Nurse Practitioner

## 2022-06-10 NOTE — Telephone Encounter (Signed)
Patient returned call regarding ct scan results,I relayed message to him and he okayed everything.

## 2022-06-18 ENCOUNTER — Other Ambulatory Visit: Payer: Self-pay | Admitting: Family

## 2022-06-18 DIAGNOSIS — J411 Mucopurulent chronic bronchitis: Secondary | ICD-10-CM

## 2022-06-18 NOTE — Telephone Encounter (Signed)
Refill request for: budesonide-formoterol (SYMBICORT) 160-4.5 MCG/ACT inhaler   LR- 12/12/21 ( 1 inhaler/ 1 refill) LV- 05/29/22 NV- 09/04/22

## 2022-06-29 ENCOUNTER — Encounter: Payer: Self-pay | Admitting: Gastroenterology

## 2022-07-01 NOTE — Progress Notes (Signed)
Remote ICD transmission.   

## 2022-07-07 ENCOUNTER — Telehealth: Payer: Self-pay

## 2022-07-07 ENCOUNTER — Encounter: Payer: Self-pay | Admitting: Gastroenterology

## 2022-07-07 ENCOUNTER — Ambulatory Visit: Payer: Medicare Other | Admitting: Gastroenterology

## 2022-07-07 VITALS — BP 124/76 | HR 71 | Ht 69.0 in | Wt 196.0 lb

## 2022-07-07 DIAGNOSIS — Z7901 Long term (current) use of anticoagulants: Secondary | ICD-10-CM | POA: Diagnosis not present

## 2022-07-07 DIAGNOSIS — Z86010 Personal history of colonic polyps: Secondary | ICD-10-CM

## 2022-07-07 DIAGNOSIS — Z800 Family history of malignant neoplasm of digestive organs: Secondary | ICD-10-CM | POA: Diagnosis not present

## 2022-07-07 MED ORDER — NA SULFATE-K SULFATE-MG SULF 17.5-3.13-1.6 GM/177ML PO SOLN: 1.0000 | Freq: Once | ORAL | 0 refills | Status: AC

## 2022-07-07 NOTE — Telephone Encounter (Signed)
   Brad Brown 1945/10/09 [id number]  Dear [facility]:  We have scheduled the above named patient for a(n) colonoscopy procedure. Our records show that (s)he is on anticoagulation therapy.  Please advise as to whether the patient may come off their therapy of Xarelto 2 days prior to their procedure which is scheduled for 07/22/22.  Please route your response to Jovita Kussmaul, CMA or fax response to [phone].  Sincerely,    [facility]

## 2022-07-07 NOTE — Patient Instructions (Signed)
You have been scheduled for a colonoscopy. Please follow written instructions given to you at your visit today.  Please pick up your prep supplies at the pharmacy within the next 1-3 days. If you use inhalers (even only as needed), please bring them with you on the day of your procedure.  The [facility] providers would like to encourage you to use MYCHART to communicate with providers for non-urgent requests or questions.  Due to long hold times on the telephone, sending your provider a message by MYCHART may be a faster and more efficient way to get a response.  Please allow 48 business hours for a response.  Please remember that this is for non-urgent requests.   Due to recent changes in healthcare laws, you may see the results of your imaging and laboratory studies on MyChart before your provider has had a chance to review them.  We understand that in some cases there may be results that are confusing or concerning to you. Not all laboratory results come back in the same time frame and the provider may be waiting for multiple results in order to interpret others.  Please give us 48 hours in order for your provider to thoroughly review all the results before contacting the office for clarification of your results.   Thank you for choosing me and [facility].  Malcolm T. Stark, Jr., MD., FACG  

## 2022-07-07 NOTE — Progress Notes (Signed)
    Assessment     Personal history of adenomatous and sessile serrated colon polyps, family history of colon cancer A-fib on Xarelto Burgada syndrome, S/P pacemaker and ICD placement CAD COPD DM MGUS   Recommendations    Schedule colonoscopy. The risks (including bleeding, perforation, infection, missed lesions, medication reactions and possible hospitalization or surgery if complications occur), benefits, and alternatives to colonoscopy with possible biopsy and possible polypectomy were discussed with the patient and they consent to proceed.   Hold Xarelto 2 days before procedure - will instruct when and how to resume after procedure. Low but real risk of cardiovascular event such as heart attack, stroke, embolism, thrombosis or ischemia/infarct of other organs off Xarelto explained and need to seek urgent help if this occurs. The patient consents to proceed. Will communicate by phone or EMR with patient's prescribing provider to confirm that holding Xarelto is reasonable in this case.     HPI    This is a 77 year old male with a history of colon polyps and a family history of colon cancer.  Colonoscopy performed in May 2021 showed 2 small tubular adenomas and 4 small sessile serrated polyps. He takes Colace daily for mild constipation. Denies weight loss, abdominal pain, diarrhea, change in stool caliber, melena, hematochezia, nausea, vomiting, dysphagia, reflux symptoms, chest pain.    Labs / Imaging       Latest Ref Rng & Units 01/29/2022    2:24 PM 07/14/2021    7:03 PM 07/14/2021    1:29 PM  Hepatic Function  Total Protein 6.0 - 8.3 g/dL 6.6  7.0  7.3   Albumin 3.5 - 5.2 g/dL 4.1  3.5  3.9   AST 0 - 37 U/L ALT 0 - 53 U/L Alk Phosphatase 39 - 117 U/L 57  64  67   Total Bilirubin 0.2 - 1.2 mg/dL 0.5  0.8  0.9        Latest Ref Rng & Units 01/29/2022    2:24 PM 07/14/2021    7:16 PM 07/14/2021    7:03 PM  CBC  WBC 4.0 - 10.5 K/uL 5.5   7.5    Hemoglobin 13.0 - 17.0 g/dL 47.8  29.5  62.1   Hematocrit 39.0 - 52.0 % 42.3  42.0  41.2   Platelets 150.0 - 400.0 K/uL 176.0   181    Current Medications, Allergies, Past Medical History, Past Surgical History, Family History and Social History were reviewed in [employer] record.   Physical Exam: General: Well developed, well nourished, no acute distress Head: Normocephalic and atraumatic Eyes: Sclerae anicteric, EOMI Ears: Normal auditory acuity Mouth: No deformities or lesions noted Lungs: Clear throughout to auscultation Heart: Regular rate and rhythm; No murmurs, rubs or bruits Abdomen: Soft, non tender and non distended. No masses, hepatosplenomegaly or hernias noted. Normal Bowel sounds Rectal: Deferred to colonoscopy  Musculoskeletal: Symmetrical with no gross deformities  Pulses:  Normal pulses noted Extremities: No edema or deformities noted Neurological: Alert oriented x 4, grossly nonfocal Psychological:  Alert and cooperative. Normal mood and affect   [facility] T. Russella Dar, MD 07/07/2022, 1:35 PM

## 2022-07-10 ENCOUNTER — Telehealth: Payer: Self-pay | Admitting: Internal Medicine

## 2022-07-10 NOTE — Telephone Encounter (Signed)
Re-faxed clearance letter to [facility].

## 2022-07-10 NOTE — Telephone Encounter (Signed)
Received fax from [facility] stating "It is fine for patient to hold Xarelto 2 days prior to procedure".  Left message for patient to return my call.

## 2022-07-10 NOTE — Telephone Encounter (Signed)
Rescheduled 05/08 appointment due to scheduling conflict, patient has been called and notified.

## 2022-07-13 ENCOUNTER — Encounter: Payer: Self-pay | Admitting: Gastroenterology

## 2022-07-13 NOTE — Telephone Encounter (Signed)
Left message for patient to return my call.

## 2022-07-14 NOTE — Telephone Encounter (Signed)
Informed patient he can hold his Xarelto 2 days prior to his procedure. Patient verbalized understanding. 

## 2022-07-19 ENCOUNTER — Encounter: Payer: Self-pay | Admitting: Certified Registered Nurse Anesthetist

## 2022-07-20 ENCOUNTER — Other Ambulatory Visit: Payer: Self-pay | Admitting: Medical Oncology

## 2022-07-20 DIAGNOSIS — D472 Monoclonal gammopathy: Secondary | ICD-10-CM

## 2022-07-21 ENCOUNTER — Inpatient Hospital Stay: Payer: Medicare Other | Attending: Physician Assistant

## 2022-07-22 ENCOUNTER — Ambulatory Visit (AMBULATORY_SURGERY_CENTER): Payer: Medicare Other | Admitting: Gastroenterology

## 2022-07-22 ENCOUNTER — Encounter: Payer: Self-pay | Admitting: Gastroenterology

## 2022-07-22 VITALS — BP 126/79 | HR 71 | Temp 97.3°F | Resp 16 | Ht 69.0 in | Wt 186.0 lb

## 2022-07-22 DIAGNOSIS — Z09 Encounter for follow-up examination after completed treatment for conditions other than malignant neoplasm: Secondary | ICD-10-CM | POA: Diagnosis not present

## 2022-07-22 DIAGNOSIS — Z86010 Personal history of colonic polyps: Secondary | ICD-10-CM | POA: Diagnosis not present

## 2022-07-22 DIAGNOSIS — Z800 Family history of malignant neoplasm of digestive organs: Secondary | ICD-10-CM

## 2022-07-22 DIAGNOSIS — D125 Benign neoplasm of sigmoid colon: Secondary | ICD-10-CM | POA: Diagnosis not present

## 2022-07-22 DIAGNOSIS — J449 Chronic obstructive pulmonary disease, unspecified: Secondary | ICD-10-CM | POA: Diagnosis not present

## 2022-07-22 DIAGNOSIS — I2510 Atherosclerotic heart disease of native coronary artery without angina pectoris: Secondary | ICD-10-CM | POA: Diagnosis not present

## 2022-07-22 DIAGNOSIS — D123 Benign neoplasm of transverse colon: Secondary | ICD-10-CM

## 2022-07-22 MED ORDER — SODIUM CHLORIDE 0.9 % IV SOLN: 500.0000 mL | Freq: Once | INTRAVENOUS | Status: DC

## 2022-07-22 NOTE — Op Note (Addendum)
[facility] Patient Name: Brad Brown Procedure Date: 07/22/2022 10:54 AM MRN: [id number] Endoscopist: Meryl Dare , MD, [phone] Age: 77 Referring MD:  Date of Birth: 09/27/1945 Gender: Male Account #: [id number] Procedure:                Colonoscopy Indications:              Surveillance: Personal history of adenomatous and                            sessile serrated polyps on last colonoscopy 3 years                            ago, Family history of colon cancer, 1st-degree                            relative Medicines:                Monitored Anesthesia Care Procedure:                Pre-Anesthesia Assessment:                           - Prior to the procedure, a History and Physical                            was performed, and patient medications and                            allergies were reviewed. The patient's tolerance of                            previous anesthesia was also reviewed. The risks                            and benefits of the procedure and the sedation                            options and risks were discussed with the patient.                            All questions were answered, and informed consent                            was obtained. Prior Anticoagulants: The patient has                            taken Xarelto (rivaroxaban), last dose was 2 days                            prior to procedure. ASA Grade Assessment: III - A                            patient with severe systemic disease. After  reviewing the risks and benefits, the patient was                            deemed in satisfactory condition to undergo the                            procedure.                           After obtaining informed consent, the colonoscope                            was passed under direct vision. Throughout the                            procedure, the patient's blood pressure, pulse, and                             oxygen saturations were monitored continuously. The                            CF [id number] #[id number] was introduced through the anus                            and advanced to the the cecum, identified by                            appendiceal orifice and ileocecal valve. The                            ileocecal valve, appendiceal orifice, and rectum                            were photographed. The quality of the bowel                            preparation was adequate after extensive lavage,                            suction. The colonoscopy was performed without                            difficulty. The patient tolerated the procedure                            well. Scope In: 10:58:43 AM Scope Out: 11:18:24 AM Scope Withdrawal Time: 0 hours 17 minutes 4 seconds  Total Procedure Duration: 0 hours 19 minutes 41 seconds  Findings:                 The perianal and digital rectal examinations were                            normal.  Two sessile polyps were found in the sigmoid colon                            and transverse colon. The polyps were 6 mm in size.                            These polyps were removed with a cold snare.                            Resection and retrieval were complete.                           The exam was otherwise without abnormality on                            direct and retroflexion views. Complications:            No immediate complications. Estimated blood loss:                            None. Estimated Blood Loss:     Estimated blood loss: none. Impression:               - Two 6 mm polyps in the sigmoid colon and in the                            transverse colon, removed with a cold snare.                            Resected and retrieved.                           - The examination was otherwise normal on direct                            and retroflexion views. Recommendation:           - Resume Xarelto (rivaroxaban)  in 2 days at prior                            dose. Refer to managing physician for further                            adjustment of therapy.                           - Patient has a contact number available for                            emergencies. The signs and symptoms of potential                            delayed complications were discussed with the  patient. Return to normal activities tomorrow.                            Written discharge instructions were provided to the                            patient.                           - Resume previous diet.                           - Continue present medications.                           - Await pathology results.                           - No repeat colonoscopy due to comorbidites and age. Meryl Dare, MD 07/22/2022 11:22:13 AM This report has been signed electronically.

## 2022-07-22 NOTE — Progress Notes (Signed)
Report given to PACU, vss 

## 2022-07-22 NOTE — Progress Notes (Signed)
See 07/07/2022 H&P, no changes 

## 2022-07-22 NOTE — Progress Notes (Signed)
Vitals-Dt ? ?Pt's states no medical or surgical changes since previsit or office visit.  ?

## 2022-07-22 NOTE — Patient Instructions (Signed)
Please read handouts provided. Continue present medications. Await pathology results. Resume previous diet. Resume Xarelto ( rivaroxaban ) in 2 days at prior dose.   YOU HAD AN ENDOSCOPIC PROCEDURE TODAY AT THE [facility]:   Refer to the procedure report that was given to you for any specific questions about what was found during the examination.  If the procedure report does not answer your questions, please call your gastroenterologist to clarify.  If you requested that your care partner not be given the details of your procedure findings, then the procedure report has been included in a sealed envelope for you to review at your convenience later.  YOU SHOULD EXPECT: Some feelings of bloating in the abdomen. Passage of more gas than usual.  Walking can help get rid of the air that was put into your GI tract during the procedure and reduce the bloating. If you had a lower endoscopy (such as a colonoscopy or flexible sigmoidoscopy) you may notice spotting of blood in your stool or on the toilet paper. If you underwent a bowel prep for your procedure, you may not have a normal bowel movement for a few days.  Please Note:  You might notice some irritation and congestion in your nose or some drainage.  This is from the oxygen used during your procedure.  There is no need for concern and it should clear up in a day or so.  SYMPTOMS TO REPORT IMMEDIATELY:  Following lower endoscopy (colonoscopy or flexible sigmoidoscopy):  Excessive amounts of blood in the stool  Significant tenderness or worsening of abdominal pains  Swelling of the abdomen that is new, acute  Fever of 100F or higher   For urgent or emergent issues, a gastroenterologist can be reached at any hour by calling [phone]. Do not use MyChart messaging for urgent concerns.    DIET:  We do recommend a small meal at first, but then you may proceed to your regular diet.  Drink plenty of fluids but you should avoid  alcoholic beverages for 24 hours.  ACTIVITY:  You should plan to take it easy for the rest of today and you should NOT DRIVE or use heavy machinery until tomorrow (because of the sedation medicines used during the test).    FOLLOW UP: Our staff will call the number listed on your records the next business day following your procedure.  We will call around 7:15- 8:00 am to check on you and address any questions or concerns that you may have regarding the information given to you following your procedure. If we do not reach you, we will leave a message.     If any biopsies were taken you will be contacted by phone or by letter within the next 1-3 weeks.  Please call us at [phone] if you have not heard about the biopsies in 3 weeks.    SIGNATURES/CONFIDENTIALITY: You and/or your care partner have signed paperwork which will be entered into your electronic medical record.  These signatures attest to the fact that that the information above on your After Visit Summary has been reviewed and is understood.  Full responsibility of the confidentiality of this discharge information lies with you and/or your care-partner.

## 2022-07-23 ENCOUNTER — Other Ambulatory Visit: Payer: Medicare Other

## 2022-07-23 ENCOUNTER — Telehealth: Payer: Self-pay | Admitting: *Deleted

## 2022-07-23 NOTE — Telephone Encounter (Signed)
  Follow up Call-     07/22/2022   10:20 AM 07/22/2022   10:15 AM  Call back number  Post procedure Call Back phone  # [phone]   Permission to leave phone message  Yes     Patient questions:  Do you have a fever, pain , or abdominal swelling? No. Pain Score  0 *  Have you tolerated food without any problems? Yes.    Have you been able to return to your normal activities? Yes.    Do you have any questions about your discharge instructions: Diet   No. Medications  No. Follow up visit  No.  Do you have questions or concerns about your Care? No.  Actions: * If pain score is 4 or above: No action needed, pain <4.

## 2022-07-25 ENCOUNTER — Other Ambulatory Visit: Payer: Self-pay | Admitting: Physician Assistant

## 2022-07-25 DIAGNOSIS — D472 Monoclonal gammopathy: Secondary | ICD-10-CM

## 2022-07-25 NOTE — Progress Notes (Deleted)
[facility] OFFICE PROGRESS NOTE  Eden Emms, NP [address] [place] [address]  DIAGNOSIS: monoclonal gammopathy diagnosed in 2008.  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   CURRENT THERAPY: Active surveillance   INTERVAL HISTORY: Brad Brown 77 y.o. male returns to clinic today for follow-up visit.  The patient was last seen 1 year ago by Dr. Clelia Croft.  Dr. Clelia Croft has left the practice and he is here to establish care with myself.  Dr. Arbutus Ped.  The patient is followed for MGUS which was diagnosed in 2008.  He is currently on observation and feeling fine.  Today he denies any fever, chills, night sweats, unexplained weight loss.  Denies any bone pain or fractures.  Denies any signs and symptoms of infection including nasal congestion, sore throat, cough, mops this, abdominal pain, dysuria, shortness of breath, or diarrhea.  Denies any lymphadenopathy.  Denies any worsening renal function but he does have some baseline CKD per chart review.  He is here today for evaluation repeat blood work He recently had a colonoscopy by Dr. Russella Dar  MEDICAL HISTORY: Past Medical History:  Diagnosis Date   Acute myocardial infarction [facility]) 06/1983   Arthritis    Atrial fibrillation (HCC)    Automatic implantable cardiac defibrillator in situ    Borderline diabetes mellitus    BPH without obstruction/lower urinary tract symptoms    CAD (coronary artery disease)    Chronic kidney disease, stage 1    Colonic polyp 2004   hyperplastic    COPD (chronic obstructive pulmonary disease) (HCC)    Diabetes mellitus without complication (HCC)    Fall    GERD (gastroesophageal reflux disease)    History of renal insufficiency syndrome    HTN (hypertension)    Hyperlipidemia    Knee derangement 09/2018   LEFT KNEE   Monoclonal gammopathy    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  MEDICATIONS:  Current Outpatient Medications  Medication Sig Dispense Refill   acetaminophen (TYLENOL) 325 MG tablet Take 650 mg by mouth 4 (four) times daily as needed.     albuterol (VENTOLIN HFA) 108 (90 Base) MCG/ACT inhaler TAKE 2 PUFFS BY MOUTH EVERY 6 HOURS AS NEEDED FOR WHEEZE OR SHORTNESS OF BREATH 8.5 each 0   baclofen (LIORESAL) 10 MG tablet Take 1 tablet (10 mg total) by mouth 2 (two) times daily as needed for muscle spasms. 20 each 0   blood glucose meter kit and supplies Check sugar twice daily. DX E11.22 1 each 12   Cholecalciferol 50 MCG (2000 UT) TABS Take 1 tablet by mouth daily.     ciclopirox (LOPROX) 0.77 % cream Apply 1 application topically 2 (two) times daily as needed (for rash).      Clotrimazole 1 % OINT Apply 1 application topically daily. 30 g 0   Continuous Blood Gluc Receiver (FREESTYLE LIBRE 14 DAY READER) DEVI 1 each by Does not apply route as directed. 2 each 11   empagliflozin (JARDIANCE) 25 MG TABS tablet Take 12.5 mg by mouth daily.     Ginkgo Biloba 60 MG TABS Take by mouth.     glipiZIDE (GLUCOTROL) 10 MG tablet TAKE 1 TABLET (10 MG TOTAL) BY MOUTH IN THE MORNING AND  AT BEDTIME 60 tablet 2   HYDROcodone-acetaminophen (NORCO) 5-325 MG tablet Take 1-2 tablets by mouth every 6 (six) hours as needed for severe pain (Take tylenol for mild and moderate pain). 30 tablet 0   levETIRAcetam (KEPPRA) 500 MG tablet 1 in the morning and 2 in the afternoon     losartan (COZAAR) 100 MG tablet Take 50 mg by mouth daily.     metFORMIN (GLUCOPHAGE) 1000 MG tablet TAKE 1 TABLET BY MOUTH 2 TIMES DAILY WITH A MEAL. 180 tablet 3   metoprolol tartrate (LOPRESSOR) 25 MG tablet Take 12.5 mg by mouth 2 (two) times daily.     Multiple Vitamin (MULTIVITAMIN) tablet Take 1 tablet by mouth daily.     Niacin, Antihyperlipidemic, 500 MG TABS Take 500 mg by mouth daily. 30 tablet 2    nystatin cream (MYCOSTATIN) Apply 1 Application topically 2 (two) times daily. 30 g 0   nystatin-triamcinolone (MYCOLOG II) cream APPLY SMALL AMOUNT TO AFFECTED AREA TWICE A DAY     Omega-3 Fatty Acids (FISH OIL) 1200 MG CAPS Take 1,200 mg by mouth daily.      rivaroxaban (XARELTO) 20 MG TABS tablet Take 20 mg by mouth daily with supper.     senna (SENOKOT) 8.6 MG TABS tablet Take 1 tablet (8.6 mg total) by mouth 2 (two) times daily. 120 tablet 0   simvastatin (ZOCOR) 20 MG tablet Take 10 mg by mouth at bedtime.      SYMBICORT 160-4.5 MCG/ACT inhaler INHALE 2 PUFFS INTO THE LUNGS TWICE A DAY 10.2 each 1   No current facility-administered medications for this visit.    SURGICAL HISTORY:  Past Surgical History:  Procedure Laterality Date   CARDIAC CATHETERIZATION  1987   Showed distal left circumflex 100% occluded    CARDIAC DEFIBRILLATOR PLACEMENT  08/17/2006   Implantation of a St. Jude single chamber defibrillator   COLONOSCOPY  07/26/2019   EP IMPLANTABLE DEVICE N/A 02/21/2016   Procedure: ICD Generator Changeout;  Surgeon: Marinus Maw, MD;  [facility] INVAS[facility] LAB;  Service: Cardiovascular;  Laterality: N/A;   INGUINAL HERNIA REPAIR Left    QUADRICEPS TENDON REPAIR Left 10/20/2018   Procedure: REPAIR QUADRICEP TENDON;  Surgeon: Sheral Apley, MD;  Location: [facility] OR;  Service: Orthopedics;  Laterality: Left;    REVIEW OF SYSTEMS:   Review of Systems  Constitutional: Negative for appetite change, chills, fatigue, fever and unexpected weight change.  HENT:   Negative for mouth sores, nosebleeds, sore throat and trouble swallowing.   Eyes: Negative for eye problems and icterus.  Respiratory: Negative for cough, hemoptysis, shortness of breath and wheezing.   Cardiovascular: Negative for chest pain and leg swelling.  Gastrointestinal: Negative for abdominal pain, constipation, diarrhea, nausea and vomiting.  Genitourinary: Negative for bladder incontinence, difficulty  urinating, dysuria, frequency and hematuria.   Musculoskeletal: Negative for back pain, gait problem, neck pain and neck stiffness.  Skin: Negative for itching and rash.  Neurological: Negative for dizziness, extremity weakness, gait problem, headaches, light-headedness and seizures.  Hematological: Negative for adenopathy. Does not bruise/bleed easily.  Psychiatric/Behavioral: Negative for confusion, depression and sleep disturbance. The patient is not nervous/anxious.     PHYSICAL EXAMINATION:  There were no vitals taken for this visit.  ECOG PERFORMANCE STATUS: {CHL ONC ECOG [id number]  Physical Exam  Constitutional: Oriented to person, place, and time and well-developed, well-nourished, and in no distress. No distress.  HENT:  Head: Normocephalic and atraumatic.  Mouth/Throat: Oropharynx is clear and moist.  No oropharyngeal exudate.  Eyes: Conjunctivae are normal. Right eye exhibits no discharge. Left eye exhibits no discharge. No scleral icterus.  Neck: Normal range of motion. Neck supple.  Cardiovascular: Normal rate, regular rhythm, normal heart sounds and intact distal pulses.   Pulmonary/Chest: Effort normal and breath sounds normal. No respiratory distress. No wheezes. No rales.  Abdominal: Soft. Bowel sounds are normal. Exhibits no distension and no mass. There is no tenderness.  Musculoskeletal: Normal range of motion. Exhibits no edema.  Lymphadenopathy:    No cervical adenopathy.  Neurological: Alert and oriented to person, place, and time. Exhibits normal muscle tone. Gait normal. Coordination normal.  Skin: Skin is warm and dry. No rash noted. Not diaphoretic. No erythema. No pallor.  Psychiatric: Mood, memory and judgment normal.  Vitals reviewed.  LABORATORY DATA: Lab Results  Component Value Date   WBC 5.5 01/29/2022   HGB 14.0 01/29/2022   HCT 42.3 01/29/2022   MCV 92.8 01/29/2022   PLT 176.0 01/29/2022      Chemistry      Component Value Date/Time    NA 138 05/29/2022 1428   NA 140 01/30/2016 1129   K 4.5 05/29/2022 1428   K 4.4 01/30/2016 1129   CL 104 05/29/2022 1428   CL 104 01/06/2012 1355   CO2 23 05/29/2022 1428   CO2 26 01/30/2016 1129   BUN 16 05/29/2022 1428   BUN 15.0 01/30/2016 1129   CREATININE 1.64 (H) 05/29/2022 1428   CREATININE 1.4 (H) 01/30/2016 1129      Component Value Date/Time   CALCIUM 9.7 05/29/2022 1428   CALCIUM 9.4 01/30/2016 1129   ALKPHOS 57 01/29/2022 1424   ALKPHOS 78 01/30/2016 1129   AST 17 01/29/2022 1424   AST 9 (L) 07/14/2021 1329   AST 20 01/30/2016 1129   ALT 30 01/29/2022 1424   ALT 13 07/14/2021 1329   ALT 28 01/30/2016 1129   BILITOT 0.5 01/29/2022 1424   BILITOT 0.9 07/14/2021 1329   BILITOT 0.82 01/30/2016 1129       RADIOGRAPHIC STUDIES:  No results found.   ASSESSMENT/PLAN:  This is a very pleasant 77 year old male with monoclonal gammopathy of undetermined significance, diagnosed in 2008.  He was found to have IgG kappa without any evidence of endorgan damage.  He is currently on observation and feeling fine.  He had protein studies today which are still pending.  His CBC and CMP show ***  We will see the patient back for follow-up visit with blood work in approximately 6 to 12 months.  If his protein studies showed concern for multiple myeloma, we will call the patient back sooner for evaluation.  The patient was advised to call immediately if he has any concerning symptoms in the interval. The patient voices understanding of current disease status and treatment options and is in agreement with the current care plan. All questions were answered. The patient knows to call the clinic with any problems, questions or concerns. We can certainly see the patient much sooner if necessary      No orders of the defined types were placed in this encounter.    I spent {CHL ONC TIME VISIT - ZOXWR:[id number]} counseling the patient face to face. The total time spent in the  appointment was {CHL ONC TIME VISIT - BJYNW:[id number]}.  Reizy Dunlow L Jarman Litton, PA-C 07/25/22

## 2022-07-28 ENCOUNTER — Inpatient Hospital Stay: Payer: Medicare Other | Attending: Physician Assistant | Admitting: Physician Assistant

## 2022-07-28 ENCOUNTER — Other Ambulatory Visit: Payer: Self-pay | Admitting: Nurse Practitioner

## 2022-07-28 DIAGNOSIS — D472 Monoclonal gammopathy: Secondary | ICD-10-CM | POA: Insufficient documentation

## 2022-07-28 DIAGNOSIS — J411 Mucopurulent chronic bronchitis: Secondary | ICD-10-CM

## 2022-07-30 ENCOUNTER — Ambulatory Visit: Payer: Medicare Other | Admitting: Oncology

## 2022-07-30 ENCOUNTER — Ambulatory Visit: Payer: Medicare Other | Admitting: Physician Assistant

## 2022-08-11 ENCOUNTER — Encounter: Payer: Self-pay | Admitting: Gastroenterology

## 2022-08-12 ENCOUNTER — Ambulatory Visit (INDEPENDENT_AMBULATORY_CARE_PROVIDER_SITE_OTHER): Payer: Medicare Other | Admitting: Internal Medicine

## 2022-08-12 ENCOUNTER — Encounter: Payer: Self-pay | Admitting: Internal Medicine

## 2022-08-12 VITALS — BP 118/72 | HR 75 | Temp 97.5°F | Ht 69.0 in | Wt 192.0 lb

## 2022-08-12 DIAGNOSIS — L03116 Cellulitis of left lower limb: Secondary | ICD-10-CM | POA: Insufficient documentation

## 2022-08-12 MED ORDER — DOXYCYCLINE HYCLATE 100 MG PO TABS: 100.0000 mg | ORAL_TABLET | Freq: Two times a day (BID) | ORAL | 0 refills | Status: DC

## 2022-08-12 NOTE — Assessment & Plan Note (Signed)
After tick bite No evidence of tick borne illness Will give doxy 100mg  bid x 7 days Warm compresses

## 2022-08-12 NOTE — Progress Notes (Signed)
Subjective:    Patient ID: Brad Brown, male    DOB: [birth year], 77 y.o.   MRN: [id number]  HPI Here due to redness around tick bite  Found a tick 5/18 Not sure how long it was on---works outside daily Took tick off--not engorged Noticed itching and redness since then  No fever, headache, rash  Current Outpatient Medications on File Prior to Visit  Medication Sig Dispense Refill   acetaminophen (TYLENOL) 325 MG tablet Take 650 mg by mouth 4 (four) times daily as needed.     albuterol (VENTOLIN HFA) 108 (90 Base) MCG/ACT inhaler TAKE 2 PUFFS BY MOUTH EVERY 6 HOURS AS NEEDED FOR WHEEZE OR SHORTNESS OF BREATH 8.5 each 0   baclofen (LIORESAL) 10 MG tablet Take 1 tablet (10 mg total) by mouth 2 (two) times daily as needed for muscle spasms. 20 each 0   blood glucose meter kit and supplies Check sugar twice daily. DX E11.22 1 each 12   Cholecalciferol 50 MCG (2000 UT) TABS Take 1 tablet by mouth daily.     ciclopirox (LOPROX) 0.77 % cream Apply 1 application topically 2 (two) times daily as needed (for rash).      Clotrimazole 1 % OINT Apply 1 application topically daily. 30 g 0   Continuous Blood Gluc Receiver (FREESTYLE LIBRE 14 DAY READER) DEVI 1 each by Does not apply route as directed. 2 each 11   empagliflozin (JARDIANCE) 25 MG TABS tablet Take 12.5 mg by mouth daily.     Ginkgo Biloba 60 MG TABS Take by mouth.     glipiZIDE (GLUCOTROL) 10 MG tablet TAKE 1 TABLET (10 MG TOTAL) BY MOUTH IN THE MORNING AND AT BEDTIME 60 tablet 2   HYDROcodone-acetaminophen (NORCO) 5-325 MG tablet Take 1-2 tablets by mouth every 6 (six) hours as needed for severe pain (Take tylenol for mild and moderate pain). 30 tablet 0   levETIRAcetam (KEPPRA) 500 MG tablet 1 in the morning and 2 in the afternoon     losartan (COZAAR) 100 MG tablet Take 50 mg by mouth daily.     metFORMIN (GLUCOPHAGE) 1000 MG tablet TAKE 1 TABLET BY MOUTH 2 TIMES DAILY WITH A MEAL. 180 tablet 3   metoprolol tartrate  (LOPRESSOR) 25 MG tablet Take 12.5 mg by mouth 2 (two) times daily.     Multiple Vitamin (MULTIVITAMIN) tablet Take 1 tablet by mouth daily.     Niacin, Antihyperlipidemic, 500 MG TABS Take 500 mg by mouth daily. 30 tablet 2   nystatin cream (MYCOSTATIN) Apply 1 Application topically 2 (two) times daily. 30 g 0   nystatin-triamcinolone (MYCOLOG II) cream APPLY SMALL AMOUNT TO AFFECTED AREA TWICE A DAY     Omega-3 Fatty Acids (FISH OIL) 1200 MG CAPS Take 1,200 mg by mouth daily.      rivaroxaban (XARELTO) 20 MG TABS tablet Take 20 mg by mouth daily with supper.     senna (SENOKOT) 8.6 MG TABS tablet Take 1 tablet (8.6 mg total) by mouth 2 (two) times daily. 120 tablet 0   simvastatin (ZOCOR) 20 MG tablet Take 10 mg by mouth at bedtime.      SYMBICORT 160-4.5 MCG/ACT inhaler INHALE 2 PUFFS INTO THE LUNGS TWICE A DAY 10.2 each 1   No current facility-administered medications on file prior to visit.    Allergies  Allergen Reactions   Ace Inhibitors    Lipitor [Atorvastatin] Rash    Past Medical History:  Diagnosis Date   Acute myocardial infarction (  HCC) 06/1983   Arthritis    Atrial fibrillation (HCC)    Automatic implantable cardiac defibrillator in situ    Borderline diabetes mellitus    BPH without obstruction/lower urinary tract symptoms    CAD (coronary artery disease)    Chronic kidney disease, stage 1    Colonic polyp 2004   hyperplastic    COPD (chronic obstructive pulmonary disease) (HCC)    Diabetes mellitus without complication (HCC)    Fall    GERD (gastroesophageal reflux disease)    History of renal insufficiency syndrome    HTN (hypertension)    Hyperlipidemia    Knee derangement 09/2018   LEFT KNEE   Monoclonal gammopathy    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 Jude single chamber defibrillator   COLONOSCOPY  07/26/2019   EP IMPLANTABLE DEVICE N/A 02/21/2016   Procedure: ICD Generator Changeout;  Surgeon: Marinus Maw, MD;  Location: [facility] INVASIVE CV LAB;  Service: Cardiovascular;  Laterality: N/A;   INGUINAL HERNIA REPAIR Left    QUADRICEPS TENDON REPAIR Left 10/20/2018   Procedure: REPAIR QUADRICEP TENDON;  Surgeon: Sheral Apley, MD;  Location: [facility] OR;  Service: Orthopedics;  Laterality: Left;    Family History  Problem Relation Age of Onset   Heart attack Mother    Colon cancer Brother    Heart attack Brother    Seizures Neg Hx    Esophageal cancer Neg Hx    Pancreatic cancer Neg Hx    Rectal cancer Neg Hx    Stomach cancer Neg Hx    Liver cancer Neg Hx     Social History   Socioeconomic History   Marital status: Single    Spouse name: Not on file   Number of children: 1   Years of education: Not on file   Highest education level: Not on file  Occupational History   Occupation: retired    Associate Professor: RETIRED  Tobacco Use   Smoking status: Former    Packs/day: 0.50    Years: 15.00    Additional pack years: 0.00    Total pack years: 7.50    Types: Cigarettes    Quit date: 03/23/2004    Years since quitting: 18.4   Smokeless tobacco: Never  Vaping Use   Vaping Use: Never used  Substance and Sexual Activity   Alcohol use: Not Currently    Alcohol/week: 1.0 standard drink of alcohol    Types: 1 Glasses of wine per week    Comment: rare--wine   Drug use: No   Sexual activity: Not Currently  Other Topics Concern   Not on file  Social History Narrative   ICD-St. Jude  Remote-Yes      Live currently @ [facility] for rehabilitation. Plans to discharge to his daughter's house for continuation of rehab.   Social Determinants of Health   Financial Resource Strain: Low Risk  (03/10/2022)   Overall Financial Resource Strain  (CARDIA)    Difficulty of Paying Living Expenses: Not very hard  Food Insecurity: No Food Insecurity (03/10/2022)   Hunger Vital Sign    Worried About Running Out of Food in the Last Year: Never  true    Ran Out of Food in the Last Year: Never true  Transportation Needs: No Transportation Needs (03/10/2022)   PRAPARE - Administrator, Civil Service (Medical): No    Lack of Transportation (Non-Medical): No  Physical Activity: Sufficiently Active (03/10/2022)   Exercise Vital Sign    Days of Exercise per Week: 7 days    Minutes of Exercise per Session: 30 min  Stress: No Stress Concern Present (03/10/2022)   [employer] of Occupational Health - Occupational Stress Questionnaire    Feeling of Stress : Not at all  Social Connections: Unknown (03/10/2022)   Social Connection and Isolation Panel [NHANES]    Frequency of Communication with Friends and Family: More than three times a week    Frequency of Social Gatherings with Friends and Family: More than three times a week    Attends Religious Services: More than 4 times per year    Active Member of [employer] or Organizations: Not on file    Attends Banker Meetings: Not on file    Marital Status: Not on file  Intimate Partner Violence: Not At Risk (03/10/2022)   Humiliation, Afraid, Rape, and Kick questionnaire    Fear of Current or Ex-Partner: No    Emotionally Abused: No    Physically Abused: No    Sexually Abused: No    Review of Systems No N/V Eating fine     Objective:   Physical Exam Constitutional:      Appearance: Normal appearance.  Skin:    Comments: 2 apparent bite sites---posterior left upper calf Higher one has 3cm of redness, warmth and swelling Lower one with 2cm redness  Neurological:     Mental Status: He is alert.            Assessment & Plan:

## 2022-08-20 ENCOUNTER — Inpatient Hospital Stay: Payer: Medicare Other

## 2022-08-20 ENCOUNTER — Other Ambulatory Visit: Payer: Self-pay

## 2022-08-20 DIAGNOSIS — D472 Monoclonal gammopathy: Secondary | ICD-10-CM | POA: Diagnosis not present

## 2022-08-20 LAB — CBC WITH DIFFERENTIAL (CANCER CENTER ONLY)
Abs Immature Granulocytes: 0.01 10*3/uL (ref 0.00–0.07)
Basophils Absolute: 0.1 10*3/uL (ref 0.0–0.1)
Basophils Relative: 1 %
Eosinophils Absolute: 0.1 10*3/uL (ref 0.0–0.5)
Eosinophils Relative: 2 %
HCT: 45.7 % (ref 39.0–52.0)
Hemoglobin: 15.4 g/dL (ref 13.0–17.0)
Immature Granulocytes: 0 %
Lymphocytes Relative: 28 %
Lymphs Abs: 1.3 10*3/uL (ref 0.7–4.0)
MCH: 30.6 pg (ref 26.0–34.0)
MCHC: 33.7 g/dL (ref 30.0–36.0)
MCV: 90.9 fL (ref 80.0–100.0)
Monocytes Absolute: 0.3 10*3/uL (ref 0.1–1.0)
Monocytes Relative: 7 %
Neutro Abs: 3 10*3/uL (ref 1.7–7.7)
Neutrophils Relative %: 62 %
Platelet Count: 176 10*3/uL (ref 150–400)
RBC: 5.03 MIL/uL (ref 4.22–5.81)
RDW: 12.6 % (ref 11.5–15.5)
WBC Count: 4.8 10*3/uL (ref 4.0–10.5)
nRBC: 0 % (ref 0.0–0.2)

## 2022-08-20 LAB — CMP (CANCER CENTER ONLY)
ALT: 21 U/L (ref 0–44)
AST: 17 U/L (ref 15–41)
Albumin: 4.4 g/dL (ref 3.5–5.0)
Alkaline Phosphatase: 49 U/L (ref 38–126)
Anion gap: 7 (ref 5–15)
BUN: 19 mg/dL (ref 8–23)
CO2: 23 mmol/L (ref 22–32)
Calcium: 9.3 mg/dL (ref 8.9–10.3)
Chloride: 105 mmol/L (ref 98–111)
Creatinine: 1.41 mg/dL — ABNORMAL HIGH (ref 0.61–1.24)
GFR, Estimated: 52 mL/min — ABNORMAL LOW (ref >60)
Glucose, Bld: 119 mg/dL — ABNORMAL HIGH (ref 70–99)
Potassium: 4.5 mmol/L (ref 3.5–5.1)
Sodium: 135 mmol/L (ref 135–145)
Total Bilirubin: 1.2 mg/dL (ref 0.3–1.2)
Total Protein: 7.6 g/dL (ref 6.5–8.1)

## 2022-08-20 LAB — LACTATE DEHYDROGENASE: LDH: 121 U/L (ref 98–192)

## 2022-08-21 LAB — KAPPA/LAMBDA LIGHT CHAINS
Kappa free light chain: 122.3 mg/L — ABNORMAL HIGH (ref 3.3–19.4)
Kappa, lambda light chain ratio: 10.92 — ABNORMAL HIGH (ref 0.26–1.65)
Lambda free light chains: 11.2 mg/L (ref 5.7–26.3)

## 2022-08-22 LAB — BETA 2 MICROGLOBULIN, SERUM: Beta-2 Microglobulin: 2.4 mg/L (ref 0.6–2.4)

## 2022-08-25 NOTE — Progress Notes (Unsigned)
[facility] OFFICE PROGRESS NOTE  Eden Emms, NP [address] [place] [address]  DIAGNOSIS: monoclonal gammopathy diagnosed in 2008.  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   CURRENT THERAPY: Observation   INTERVAL HISTORY: Brad Brown 77 y.o. male returns to clinic today for follow-up visit.  The patient was previously seen by Dr. Clelia Croft who has since left the practice.  He is here to establish care with Dr. Arbutus Ped and myself today.  The patient was followed by Dr. Clelia Croft for his history of MGUS for which she is currently on observation.  He is seen annually.  Since last being seen, the patient denies any major changes in his health.  He follows with cardiology for Brugada syndrome and has an ICD.  He follows with his PCP for hypertension, diabetes, hyperlipidemia.  He recently saw GI for routine colonoscopy.  He denies any fever, chills, night sweats, unexplained weight loss, or lymphadenopathy.  Denies any recent or frequent infections.  Denies any early satiety or abdominal bloating.  Denies any peripheral neuropathy.  Denies any easy bleeding or bruising.  Denies any unusual bone pain or pathologic fractures.  Denies any nausea, vomiting, diarrhea, or constipation.  He recently had repeat myeloma studies performed.  He is here today for evaluation to review his scan results.  MEDICAL HISTORY: Past Medical History:  Diagnosis Date   Acute myocardial infarction [facility]) 06/1983   Arthritis    Atrial fibrillation (HCC)    Automatic implantable cardiac defibrillator in situ    Borderline diabetes mellitus    BPH without obstruction/lower urinary tract symptoms    CAD (coronary artery disease)    Chronic kidney disease, stage 1    Colonic polyp 2004   hyperplastic    COPD (chronic obstructive pulmonary disease) (HCC)    Diabetes mellitus without complication (HCC)    Fall    GERD (gastroesophageal reflux disease)     History of renal insufficiency syndrome    HTN (hypertension)    Hyperlipidemia    Knee derangement 09/2018   LEFT KNEE   Monoclonal gammopathy    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  MEDICATIONS:  Current Outpatient Medications  Medication Sig Dispense Refill   acetaminophen (TYLENOL) 325 MG tablet Take 650 mg by mouth 4 (four) times daily as needed.     albuterol (VENTOLIN HFA) 108 (90 Base) MCG/ACT inhaler TAKE 2 PUFFS BY MOUTH EVERY 6 HOURS AS NEEDED FOR WHEEZE OR SHORTNESS OF BREATH 8.5 each 0   baclofen (LIORESAL) 10 MG tablet Take 1 tablet (10 mg total) by mouth 2 (two) times daily as needed for muscle spasms. 20 each 0   blood glucose meter kit and supplies Check sugar twice daily. DX E11.22 1 each 12   Cholecalciferol 50 MCG (2000 UT) TABS Take 1 tablet by mouth daily.     ciclopirox (LOPROX) 0.77 % cream Apply 1 application topically 2 (two) times daily as needed (for rash).      Clotrimazole 1 % OINT Apply 1 application topically daily. 30 g 0   Continuous Blood Gluc Receiver (FREESTYLE LIBRE 14 DAY READER) DEVI 1 each by Does not apply route as directed. 2 each 11   doxycycline (VIBRA-TABS) 100 MG tablet  Take 1 tablet (100 mg total) by mouth 2 (two) times daily. 14 tablet 0   empagliflozin (JARDIANCE) 25 MG TABS tablet Take 12.5 mg by mouth daily.     Ginkgo Biloba 60 MG TABS Take by mouth.     glipiZIDE (GLUCOTROL) 10 MG tablet TAKE 1 TABLET (10 MG TOTAL) BY MOUTH IN THE MORNING AND AT BEDTIME 60 tablet 2   HYDROcodone-acetaminophen (NORCO) 5-325 MG tablet Take 1-2 tablets by mouth every 6 (six) hours as needed for severe pain (Take tylenol for mild and moderate pain). 30 tablet 0   levETIRAcetam (KEPPRA) 500 MG tablet 1 in the morning and 2 in the afternoon     losartan (COZAAR) 100 MG tablet Take 50 mg by mouth  daily.     metFORMIN (GLUCOPHAGE) 1000 MG tablet TAKE 1 TABLET BY MOUTH 2 TIMES DAILY WITH A MEAL. 180 tablet 3   metoprolol tartrate (LOPRESSOR) 25 MG tablet Take 12.5 mg by mouth 2 (two) times daily.     Multiple Vitamin (MULTIVITAMIN) tablet Take 1 tablet by mouth daily.     Niacin, Antihyperlipidemic, 500 MG TABS Take 500 mg by mouth daily. 30 tablet 2   nystatin cream (MYCOSTATIN) Apply 1 Application topically 2 (two) times daily. 30 g 0   nystatin-triamcinolone (MYCOLOG II) cream APPLY SMALL AMOUNT TO AFFECTED AREA TWICE A DAY     Omega-3 Fatty Acids (FISH OIL) 1200 MG CAPS Take 1,200 mg by mouth daily.      rivaroxaban (XARELTO) 20 MG TABS tablet Take 20 mg by mouth daily with supper.     senna (SENOKOT) 8.6 MG TABS tablet Take 1 tablet (8.6 mg total) by mouth 2 (two) times daily. 120 tablet 0   simvastatin (ZOCOR) 20 MG tablet Take 10 mg by mouth at bedtime.      SYMBICORT 160-4.5 MCG/ACT inhaler INHALE 2 PUFFS INTO THE LUNGS TWICE A DAY 10.2 each 1   No current facility-administered medications for this visit.    SURGICAL HISTORY:  Past Surgical History:  Procedure Laterality Date   CARDIAC CATHETERIZATION  1987   Showed distal left circumflex 100% occluded    CARDIAC DEFIBRILLATOR PLACEMENT  08/17/2006   Implantation of a [employer] single chamber defibrillator   COLONOSCOPY  07/26/2019   EP IMPLANTABLE DEVICE N/A 02/21/2016   Procedure: ICD Generator Changeout;  Surgeon: Marinus Maw, MD;  Location: [facility] INVASIVE CV LAB;  Service: Cardiovascular;  Laterality: N/A;   INGUINAL HERNIA REPAIR Left    QUADRICEPS TENDON REPAIR Left 10/20/2018   Procedure: REPAIR QUADRICEP TENDON;  Surgeon: Sheral Apley, MD;  Location: [facility] OR;  Service: Orthopedics;  Laterality: Left;    REVIEW OF SYSTEMS:   Review of Systems  Constitutional: Negative for appetite change, chills, fatigue, fever and unexpected weight change.  HENT:   Negative for mouth sores, nosebleeds, sore throat and trouble  swallowing.   Eyes: Negative for eye problems and icterus.  Respiratory: Negative for cough, hemoptysis, shortness of breath and wheezing.   Cardiovascular: Negative for chest pain and leg swelling.  Gastrointestinal: Negative for abdominal pain, constipation, diarrhea, nausea and vomiting.  Genitourinary: Negative for bladder incontinence, difficulty urinating, dysuria, frequency and hematuria.   Musculoskeletal: Negative for back pain, gait problem, neck pain and neck stiffness.  Skin: Negative for itching and rash.  Neurological: Negative for dizziness, extremity weakness, gait problem, headaches, light-headedness and seizures.  Hematological: Negative for adenopathy. Does not bruise/bleed easily.  Psychiatric/Behavioral: Negative for confusion, depression and sleep disturbance.  The patient is not nervous/anxious.     PHYSICAL EXAMINATION:  There were no vitals taken for this visit.  ECOG PERFORMANCE STATUS: {CHL ONC ECOG [id number]  Physical Exam  Constitutional: Oriented to person, place, and time and well-developed, well-nourished, and in no distress. No distress.  HENT:  Head: Normocephalic and atraumatic.  Mouth/Throat: Oropharynx is clear and moist. No oropharyngeal exudate.  Eyes: Conjunctivae are normal. Right eye exhibits no discharge. Left eye exhibits no discharge. No scleral icterus.  Neck: Normal range of motion. Neck supple.  Cardiovascular: Normal rate, regular rhythm, normal heart sounds and intact distal pulses.   Pulmonary/Chest: Effort normal and breath sounds normal. No respiratory distress. No wheezes. No rales.  Abdominal: Soft. Bowel sounds are normal. Exhibits no distension and no mass. There is no tenderness.  Musculoskeletal: Normal range of motion. Exhibits no edema.  Lymphadenopathy:    No cervical adenopathy.  Neurological: Alert and oriented to person, place, and time. Exhibits normal muscle tone. Gait normal. Coordination normal.  Skin: Skin is  warm and dry. No rash noted. Not diaphoretic. No erythema. No pallor.  Psychiatric: Mood, memory and judgment normal.  Vitals reviewed.  LABORATORY DATA: Lab Results  Component Value Date   WBC 4.8 08/20/2022   HGB 15.4 08/20/2022   HCT 45.7 08/20/2022   MCV 90.9 08/20/2022   PLT 176 08/20/2022      Chemistry      Component Value Date/Time   NA 135 08/20/2022 0810   NA 140 01/30/2016 1129   K 4.5 08/20/2022 0810   K 4.4 01/30/2016 1129   CL 105 08/20/2022 0810   CL 104 01/06/2012 1355   CO2 23 08/20/2022 0810   CO2 26 01/30/2016 1129   BUN 19 08/20/2022 0810   BUN 15.0 01/30/2016 1129   CREATININE 1.41 (H) 08/20/2022 0810   CREATININE 1.64 (H) 05/29/2022 1428   CREATININE 1.4 (H) 01/30/2016 1129      Component Value Date/Time   CALCIUM 9.3 08/20/2022 0810   CALCIUM 9.4 01/30/2016 1129   ALKPHOS 49 08/20/2022 0810   ALKPHOS 78 01/30/2016 1129   AST 17 08/20/2022 0810   AST 20 01/30/2016 1129   ALT 21 08/20/2022 0810   ALT 28 01/30/2016 1129   BILITOT 1.2 08/20/2022 0810   BILITOT 0.82 01/30/2016 1129       RADIOGRAPHIC STUDIES:  No results found.   ASSESSMENT/PLAN:  This is a very pleasant 77 year old African-American male with MGUS was first diagnosed in 2008.  He was found to have IgG kappa without any evidence of endorgan damage.  He is currently on observation.  He recently had repeat protein studies performed on 08/20/2022.  The patient was seen with Dr. Arbutus Ped today.  The patient's labs do not show any evidence of renal failure or anemia.  His protein studies are stable to ***improved.    Dr. Arbutus Ped recommends that he continue on observation with repeat protein studies in 1 year and a follow-up visit the next week to review the results.  The patient was advised to call immediately if she has any concerning symptoms in the interval. The patient voices understanding of current disease status and treatment options and is in agreement with the current care  plan. All questions were answered. The patient knows to call the clinic with any problems, questions or concerns. We can certainly see the patient much sooner if necessary   No orders of the defined types were placed in this encounter.    I spent {  CHL ONC TIME VISIT - [id number]} counseling the patient face to face. The total time spent in the appointment was {CHL ONC TIME VISIT - BJYNW:[id number]}.  Brad Zilka L Jerric Oyen, PA-C 08/25/22

## 2022-08-26 ENCOUNTER — Ambulatory Visit (INDEPENDENT_AMBULATORY_CARE_PROVIDER_SITE_OTHER): Payer: Medicare Other

## 2022-08-26 ENCOUNTER — Telehealth: Payer: Self-pay | Admitting: Internal Medicine

## 2022-08-26 DIAGNOSIS — I255 Ischemic cardiomyopathy: Secondary | ICD-10-CM

## 2022-08-26 LAB — CUP PACEART REMOTE DEVICE CHECK
Battery Remaining Longevity: 48 mo
Battery Remaining Percentage: 46 %
Battery Voltage: 2.9 V
Brady Statistic RV Percent Paced: 2.4 %
Date Time Interrogation Session: 20240605061600
HighPow Impedance: 52 Ohm
HighPow Impedance: 52 Ohm
Implantable Lead Connection Status: 753985
Implantable Lead Implant Date: 20080527
Implantable Lead Location: 753860
Implantable Lead Model: 7121
Implantable Pulse Generator Implant Date: 20171201
Lead Channel Impedance Value: 490 Ohm
Lead Channel Pacing Threshold Amplitude: 1.25 V
Lead Channel Pacing Threshold Pulse Width: 0.5 ms
Lead Channel Sensing Intrinsic Amplitude: 9.9 mV
Lead Channel Setting Pacing Amplitude: 2.5 V
Lead Channel Setting Pacing Pulse Width: 0.5 ms
Lead Channel Setting Sensing Sensitivity: 0.5 mV
Pulse Gen Serial Number: 7283394
Zone Setting Status: 755011

## 2022-08-26 NOTE — Telephone Encounter (Signed)
  Per Answering service message:  Patient is calling to make sure we received his transmission and he would like a call back.

## 2022-08-26 NOTE — Telephone Encounter (Signed)
Remote transmission received and reviewed. Shows normal device function.   Attempted to contact patient to f/u. No answer, LMTCB.

## 2022-08-27 ENCOUNTER — Other Ambulatory Visit: Payer: Self-pay

## 2022-08-27 ENCOUNTER — Inpatient Hospital Stay: Payer: Medicare Other | Attending: Physician Assistant | Admitting: Physician Assistant

## 2022-08-27 VITALS — BP 129/95 | HR 66 | Temp 97.7°F | Resp 19 | Ht 69.0 in | Wt 191.5 lb

## 2022-08-27 DIAGNOSIS — D472 Monoclonal gammopathy: Secondary | ICD-10-CM | POA: Insufficient documentation

## 2022-08-31 LAB — MULTIPLE MYELOMA PANEL, SERUM
Albumin SerPl Elph-Mcnc: 3.9 g/dL (ref 2.9–4.4)
Albumin/Glob SerPl: 1.3 (ref 0.7–1.7)
Alpha 1: 0.2 g/dL (ref 0.0–0.4)
Alpha2 Glob SerPl Elph-Mcnc: 0.7 g/dL (ref 0.4–1.0)
B-Globulin SerPl Elph-Mcnc: 0.8 g/dL (ref 0.7–1.3)
Gamma Glob SerPl Elph-Mcnc: 1.4 g/dL (ref 0.4–1.8)
Globulin, Total: 3.1 g/dL (ref 2.2–3.9)
IgA: 35 mg/dL — ABNORMAL LOW (ref 61–437)
IgG (Immunoglobin G), Serum: 1587 mg/dL (ref 603–1613)
IgM (Immunoglobulin M), Srm: 15 mg/dL (ref 15–143)
M Protein SerPl Elph-Mcnc: 1.1 g/dL — ABNORMAL HIGH
Total Protein ELP: 7 g/dL (ref 6.0–8.5)

## 2022-09-04 ENCOUNTER — Encounter: Payer: Self-pay | Admitting: Nurse Practitioner

## 2022-09-04 ENCOUNTER — Ambulatory Visit (INDEPENDENT_AMBULATORY_CARE_PROVIDER_SITE_OTHER): Payer: Medicare Other | Admitting: Nurse Practitioner

## 2022-09-04 VITALS — BP 132/64 | HR 70 | Temp 98.0°F | Resp 16 | Ht 69.0 in | Wt 193.0 lb

## 2022-09-04 DIAGNOSIS — J449 Chronic obstructive pulmonary disease, unspecified: Secondary | ICD-10-CM

## 2022-09-04 DIAGNOSIS — E1122 Type 2 diabetes mellitus with diabetic chronic kidney disease: Secondary | ICD-10-CM | POA: Diagnosis not present

## 2022-09-04 DIAGNOSIS — N181 Chronic kidney disease, stage 1: Secondary | ICD-10-CM | POA: Diagnosis not present

## 2022-09-04 DIAGNOSIS — I10 Essential (primary) hypertension: Secondary | ICD-10-CM

## 2022-09-04 DIAGNOSIS — Z7984 Long term (current) use of oral hypoglycemic drugs: Secondary | ICD-10-CM

## 2022-09-04 LAB — POCT GLYCOSYLATED HEMOGLOBIN (HGB A1C): Hemoglobin A1C: 6.9 % — AB (ref 4.0–5.6)

## 2022-09-04 NOTE — Patient Instructions (Signed)
Nice to see you today Your A1C is 6.9% today which is great.  Follow up with me in 4 months, sooner if you need me

## 2022-09-04 NOTE — Progress Notes (Signed)
Established Patient Office Visit  Subjective   Patient ID: Brad Brown, male    DOB: 04-Jun-1945  Age: 77 y.o. MRN: [id number]  Chief Complaint  Patient presents with   Diabetes       DM2: Patient is currently maintained on glipizide 10 mg twice daily Jardiance 25 mg metformin 1000 mg twice daily.  States that he will check it once a week. Averages of 140s.  States that his appetite is good. He has been staying away form sugars   HTN: Patient currently maintained on losartan 100 mg, metoprolol 12.5 mg twice daily. Can check his blood pressure at home and will check it every other day.   COPD: states that he is using the symbicort everyday. States that it has been several months since his albuterol use. States winter is worse on his breathing .   Review of Systems  Constitutional:  Negative for chills and fever.  Respiratory:  Negative for shortness of breath.   Cardiovascular:  Negative for chest pain.  Neurological:  Positive for tingling (left hand). Negative for headaches.      Objective:     BP 132/64   Pulse 70   Temp 98 F (36.7 C)   Resp 16   Ht 5\' 9"  (1.753 m)   Wt 193 lb (87.5 kg)   SpO2 96%   BMI 28.50 kg/m  BP Readings from Last 3 Encounters:  09/04/22 132/64  08/27/22 (!) 129/95  08/12/22 118/72   Wt Readings from Last 3 Encounters:  09/04/22 193 lb (87.5 kg)  08/27/22 191 lb 8 oz (86.9 kg)  08/12/22 192 lb (87.1 kg)      Physical Exam Vitals and nursing note reviewed.  Constitutional:      Appearance: Normal appearance.  Cardiovascular:     Rate and Rhythm: Normal rate and regular rhythm.     Pulses:          Dorsalis pedis pulses are 2+ on the right side and 2+ on the left side.       Posterior tibial pulses are 2+ on the right side and 2+ on the left side.     Heart sounds: Normal heart sounds.  Pulmonary:     Effort: Pulmonary effort is normal.     Breath sounds: Normal breath sounds.  Musculoskeletal:     Right lower leg:  No edema.     Left lower leg: No edema.  Feet:     Right foot:     Skin integrity: Skin integrity normal.     Toenail Condition: Right toenails are long.     Left foot:     Skin integrity: Skin integrity normal.     Toenail Condition: Left toenails are long.  Neurological:     Mental Status: He is alert.      Results for orders placed or performed in visit on 09/04/22  POCT glycosylated hemoglobin (Hb A1C)  Result Value Ref Range   Hemoglobin A1C 6.9 (A) 4.0 - 5.6 %   HbA1c POC (<> result, manual entry)     HbA1c, POC (prediabetic range)     HbA1c, POC (controlled diabetic range)        The ASCVD Risk score (Arnett DK, et al., 2019) failed to calculate for the following reasons:   The patient has a prior MI or stroke diagnosis    Assessment & Plan:   Problem List Items Addressed This Visit       Cardiovascular and Mediastinum  Hypertension    Patient currently maintained on losartan 100 mg metoprolol 12.5 milligrams twice daily.  Blood pressure under good control.  Patient denies any falls or lightheadedness or dizziness.  He does check his blood pressure frequently at home continue taking medication as prescribed to taking blood pressure at home        Respiratory   COPD (chronic obstructive pulmonary disease) (HCC)    Patient currently maintained on Symbicort 2 puffs twice daily.  He has been taking medication as prescribed.  States has been months since he had uses albuterol inhaler.  He did mention that winter is worse in the spring and the summer.  Stable at this juncture continue medication as prescribed        Endocrine   Type 2 diabetes mellitus with stage 1 chronic kidney disease, without long-term current use of insulin (HCC) - Primary    Patient currently maintained on Jardiance 25 mg, metoprolol 1000 mg twice daily, glipizide 10 mg twice daily.  Patient checks blood glucose infrequently at home but does have the supplies to check it more frequently.  A1c  is 6.9% today.  No recent hypoglycemic episodes per patient report.  Did review signs and symptoms of hypoglycemia continue medications as prescribed      Relevant Orders   POCT glycosylated hemoglobin (Hb A1C) (Completed)    Return in about 4 months (around 01/04/2023) for DM recheck.    Audria Nine, NP

## 2022-09-04 NOTE — Assessment & Plan Note (Signed)
Patient currently maintained on Jardiance 25 mg, metoprolol 1000 mg twice daily, glipizide 10 mg twice daily.  Patient checks blood glucose infrequently at home but does have the supplies to check it more frequently.  A1c is 6.9% today.  No recent hypoglycemic episodes per patient report.  Did review signs and symptoms of hypoglycemia continue medications as prescribed

## 2022-09-04 NOTE — Assessment & Plan Note (Signed)
Patient currently maintained on Symbicort 2 puffs twice daily.  He has been taking medication as prescribed.  States has been months since he had uses albuterol inhaler.  He did mention that winter is worse in the spring and the summer.  Stable at this juncture continue medication as prescribed

## 2022-09-04 NOTE — Assessment & Plan Note (Signed)
Patient currently maintained on losartan 100 mg metoprolol 12.5 milligrams twice daily.  Blood pressure under good control.  Patient denies any falls or lightheadedness or dizziness.  He does check his blood pressure frequently at home continue taking medication as prescribed to taking blood pressure at home

## 2022-09-21 NOTE — Progress Notes (Signed)
Remote ICD transmission.   

## 2022-09-22 ENCOUNTER — Ambulatory Visit: Payer: Medicare Other | Attending: Internal Medicine | Admitting: Internal Medicine

## 2022-09-22 ENCOUNTER — Encounter: Payer: Self-pay | Admitting: Internal Medicine

## 2022-09-22 VITALS — BP 144/76 | HR 74 | Ht 69.0 in | Wt 194.0 lb

## 2022-09-22 DIAGNOSIS — I498 Other specified cardiac arrhythmias: Secondary | ICD-10-CM

## 2022-09-22 NOTE — Patient Instructions (Signed)
Medication Instructions:  Your physician recommends that you continue on your current medications as directed. Please refer to the Current Medication list given to you today.  *If you need a refill on your cardiac medications before your next appointment, please call your pharmacy*  Follow-Up: At [facility], you and your health needs are our priority.  As part of our continuing mission to provide you with exceptional heart care, we have created designated Provider Care Teams.  These Care Teams include your primary Cardiologist (physician) and Advanced Practice Providers (APPs -  Physician Assistants and Nurse Practitioners) who all work together to provide you with the care you need, when you need it.  Your next appointment:   1 year(s)  Provider:   You may see Gregg Taylor, MD or one of the following Advanced Practice Providers on your designated Care Team:   Renee Ursuy, PA-C Michael "Andy" Tillery, PA-C Suzann Riddle, NP Brandi Ollis, NP    

## 2022-09-22 NOTE — Progress Notes (Signed)
HPI [name] returns today for ongoing followup and preoperative evaluation. He is a pleasant 77 yo man with Brugada syndrome,s/p ICD insertion. He denies chest pain or sob. He remains active. No other complaints. No ICD therapies. He does not feel his SVT. He has tried to lose weight.  Allergies  Allergen Reactions   Ace Inhibitors    Lipitor [Atorvastatin] Rash     Current Outpatient Medications  Medication Sig Dispense Refill   acetaminophen (TYLENOL) 325 MG tablet Take 650 mg by mouth 4 (four) times daily as needed.     albuterol (VENTOLIN HFA) 108 (90 Base) MCG/ACT inhaler TAKE 2 PUFFS BY MOUTH EVERY 6 HOURS AS NEEDED FOR WHEEZE OR SHORTNESS OF BREATH 8.5 each 0   baclofen (LIORESAL) 10 MG tablet Take 1 tablet (10 mg total) by mouth 2 (two) times daily as needed for muscle spasms. 20 each 0   blood glucose meter kit and supplies Check sugar twice daily. DX E11.22 1 each 12   Cholecalciferol 50 MCG (2000 UT) TABS Take 1 tablet by mouth daily.     ciclopirox (LOPROX) 0.77 % cream Apply 1 application topically 2 (two) times daily as needed (for rash).      Clotrimazole 1 % OINT Apply 1 application topically daily. 30 g 0   Continuous Blood Gluc Receiver (FREESTYLE LIBRE 14 DAY READER) DEVI 1 each by Does not apply route as directed. 2 each 11   doxycycline (VIBRA-TABS) 100 MG tablet Take 1 tablet (100 mg total) by mouth 2 (two) times daily. 14 tablet 0   empagliflozin (JARDIANCE) 25 MG TABS tablet Take 12.5 mg by mouth daily.     Ginkgo Biloba 60 MG TABS Take by mouth.     glipiZIDE (GLUCOTROL) 10 MG tablet TAKE 1 TABLET (10 MG TOTAL) BY MOUTH IN THE MORNING AND AT BEDTIME 60 tablet 2   HYDROcodone-acetaminophen (NORCO) 5-325 MG tablet Take 1-2 tablets by mouth every 6 (six) hours as needed for severe pain (Take tylenol for mild and moderate pain). 30 tablet 0   levETIRAcetam (KEPPRA) 500 MG tablet 1 in the morning and 2 in the afternoon     losartan (COZAAR) 100 MG tablet  Take 50 mg by mouth daily.     metFORMIN (GLUCOPHAGE) 1000 MG tablet TAKE 1 TABLET BY MOUTH 2 TIMES DAILY WITH A MEAL. 180 tablet 3   metoprolol tartrate (LOPRESSOR) 25 MG tablet Take 12.5 mg by mouth 2 (two) times daily.     Multiple Vitamin (MULTIVITAMIN) tablet Take 1 tablet by mouth daily.     Niacin, Antihyperlipidemic, 500 MG TABS Take 500 mg by mouth daily. 30 tablet 2   nystatin cream (MYCOSTATIN) Apply 1 Application topically 2 (two) times daily. 30 g 0   nystatin-triamcinolone (MYCOLOG II) cream APPLY SMALL AMOUNT TO AFFECTED AREA TWICE A DAY     Omega-3 Fatty Acids (FISH OIL) 1200 MG CAPS Take 1,200 mg by mouth daily.      rivaroxaban (XARELTO) 20 MG TABS tablet Take 20 mg by mouth daily with supper.     senna (SENOKOT) 8.6 MG TABS tablet Take 1 tablet (8.6 mg total) by mouth 2 (two) times daily. 120 tablet 0   simvastatin (ZOCOR) 20 MG tablet Take 10 mg by mouth at bedtime.      SYMBICORT 160-4.5 MCG/ACT inhaler INHALE 2 PUFFS INTO THE LUNGS TWICE A DAY 10.2 each 1   No current facility-administered medications for this visit.  Past Medical History:  Diagnosis Date   Acute myocardial infarction [facility]) 06/1983   Arthritis    Atrial fibrillation (HCC)    Automatic implantable cardiac defibrillator in situ    Borderline diabetes mellitus    BPH without obstruction/lower urinary tract symptoms    CAD (coronary artery disease)    Chronic kidney disease, stage 1    Colonic polyp 2004   hyperplastic    COPD (chronic obstructive pulmonary disease) (HCC)    Diabetes mellitus without complication (HCC)    Fall    GERD (gastroesophageal reflux disease)    History of renal insufficiency syndrome    HTN (hypertension)    Hyperlipidemia    Knee derangement 09/2018   LEFT KNEE   Monoclonal gammopathy    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   Past Surgical History:  Procedure Laterality Date   CARDIAC CATHETERIZATION  1987   Showed distal left circumflex 100% occluded    CARDIAC DEFIBRILLATOR PLACEMENT  08/17/2006   Implantation of a St. Jude single chamber defibrillator   COLONOSCOPY  07/26/2019   EP IMPLANTABLE DEVICE N/A 02/21/2016   Procedure: ICD Generator Changeout;  Surgeon: Marinus Maw, MD;  Location: [facility] INVASIVE CV LAB;  Service: Cardiovascular;  Laterality: N/A;   INGUINAL HERNIA REPAIR Left    QUADRICEPS TENDON REPAIR Left 10/20/2018   Procedure: REPAIR QUADRICEP TENDON;  Surgeon: Sheral Apley, MD;  Location: [facility] OR;  Service: Orthopedics;  Laterality: Left;     Family History  Problem Relation Age of Onset   Heart attack Mother    Colon cancer Brother    Heart attack Brother    Seizures Neg Hx    Esophageal cancer Neg Hx    Pancreatic cancer Neg Hx    Rectal cancer Neg Hx    Stomach cancer Neg Hx    Liver cancer Neg Hx      Social History   Socioeconomic History   Marital status: Single    Spouse name: Not on file   Number of children: 1   Years of education: Not on file   Highest education level: Not on file  Occupational History   Occupation: retired    Associate Professor: RETIRED  Tobacco Use   Smoking status: Former    Packs/day: 0.50    Years: 15.00    Additional pack years: 0.00    Total pack years: 7.50    Types: Cigarettes    Quit date: 03/23/2004    Years since quitting: 18.5   Smokeless tobacco: Never  Vaping Use   Vaping Use: Never used  Substance and Sexual Activity   Alcohol use: Not Currently    Alcohol/week: 1.0 standard drink of alcohol    Types: 1 Glasses of wine per week    Comment: rare--wine   Drug use: No   Sexual activity: Not Currently  Other Topics Concern   Not on file  Social History Narrative   ICD-St. Jude  Remote-Yes      Live currently @ [facility] for rehabilitation. Plans to discharge to his daughter's house for  continuation of rehab.   Social Determinants of Health   Financial Resource Strain: Low Risk  (03/10/2022)   Overall Financial Resource Strain (CARDIA)    Difficulty of Paying Living Expenses:  Not very hard  Food Insecurity: No Food Insecurity (03/10/2022)   Hunger Vital Sign    Worried About Running Out of Food in the Last Year: Never true    Ran Out of Food in the Last Year: Never true  Transportation Needs: No Transportation Needs (03/10/2022)   PRAPARE - Administrator, Civil Service (Medical): No    Lack of Transportation (Non-Medical): No  Physical Activity: Sufficiently Active (03/10/2022)   Exercise Vital Sign    Days of Exercise per Week: 7 days    Minutes of Exercise per Session: 30 min  Stress: No Stress Concern Present (03/10/2022)   [employer] of Occupational Health - Occupational Stress Questionnaire    Feeling of Stress : Not at all  Social Connections: Unknown (03/10/2022)   Social Connection and Isolation Panel [NHANES]    Frequency of Communication with Friends and Family: More than three times a week    Frequency of Social Gatherings with Friends and Family: More than three times a week    Attends Religious Services: More than 4 times per year    Active Member of [employer] or Organizations: Not on file    Attends Banker Meetings: Not on file    Marital Status: Not on file  Intimate Partner Violence: Not At Risk (03/10/2022)   Humiliation, Afraid, Rape, and Kick questionnaire    Fear of Current or Ex-Partner: No    Emotionally Abused: No    Physically Abused: No    Sexually Abused: No     BP (!) 144/76   Pulse 74   Ht 5\' 9"  (1.753 m)   Wt 194 lb (88 kg)   SpO2 96%   BMI 28.65 kg/m   Physical Exam:  Well appearing NAD HEENT: Unremarkable Neck:  No JVD, no thyromegally Lymphatics:  No adenopathy Back:  No CVA tenderness Lungs:  Clear HEART:  Regular rate rhythm, no murmurs, no rubs, no clicks Abd:  soft, positive  bowel sounds, no organomegally, no rebound, no guarding Ext:  2 plus pulses, no edema, no cyanosis, no clubbing Skin:  No rashes no nodules Neuro:  CN II through XII intact, motor grossly intact   DEVICE  Normal device function.  See PaceArt for details.   Assess/Plan: 1. Brugada syndrome - He has had no recurrent ventricular arrhythmias. He will continue his current meds. No indication for quinidine. 2. ICD - His St. Jude single chamber ICD is working normally. Over 3.9 years of battery longevity. 3. CAD - he denies anginal symptoms. He remains active. 4. SVT - he has had no SVT since his last check.   Sharlot Gowda Tyra Michelle,MD

## 2022-09-23 LAB — CUP PACEART INCLINIC DEVICE CHECK
Battery Remaining Longevity: 46 mo
Brady Statistic RV Percent Paced: 2.3 %
Date Time Interrogation Session: 20240702142300
HighPow Impedance: 54.2781
Implantable Lead Connection Status: 753985
Implantable Lead Implant Date: 20080527
Implantable Lead Location: 753860
Implantable Lead Model: 7121
Implantable Pulse Generator Implant Date: 20171201
Lead Channel Impedance Value: 537.5 Ohm
Lead Channel Pacing Threshold Amplitude: 1 V
Lead Channel Pacing Threshold Amplitude: 1 V
Lead Channel Pacing Threshold Pulse Width: 0.5 ms
Lead Channel Pacing Threshold Pulse Width: 0.5 ms
Lead Channel Sensing Intrinsic Amplitude: 12 mV
Lead Channel Setting Pacing Amplitude: 2.5 V
Lead Channel Setting Pacing Pulse Width: 0.5 ms
Lead Channel Setting Sensing Sensitivity: 0.5 mV
Pulse Gen Serial Number: 7283394
Zone Setting Status: 755011

## 2022-10-26 ENCOUNTER — Other Ambulatory Visit: Payer: Self-pay | Admitting: Nurse Practitioner

## 2022-11-17 DIAGNOSIS — H25812 Combined forms of age-related cataract, left eye: Secondary | ICD-10-CM | POA: Diagnosis not present

## 2022-11-17 DIAGNOSIS — H26491 Other secondary cataract, right eye: Secondary | ICD-10-CM | POA: Diagnosis not present

## 2022-11-17 DIAGNOSIS — E119 Type 2 diabetes mellitus without complications: Secondary | ICD-10-CM | POA: Diagnosis not present

## 2022-11-17 DIAGNOSIS — H35033 Hypertensive retinopathy, bilateral: Secondary | ICD-10-CM | POA: Diagnosis not present

## 2022-11-24 ENCOUNTER — Ambulatory Visit (INDEPENDENT_AMBULATORY_CARE_PROVIDER_SITE_OTHER): Payer: Medicare Other | Admitting: Nurse Practitioner

## 2022-11-24 ENCOUNTER — Encounter: Payer: Self-pay | Admitting: Nurse Practitioner

## 2022-11-24 ENCOUNTER — Telehealth: Payer: Self-pay

## 2022-11-24 VITALS — BP 122/72 | HR 73 | Temp 97.8°F | Ht 69.0 in | Wt 192.2 lb

## 2022-11-24 DIAGNOSIS — N489 Disorder of penis, unspecified: Secondary | ICD-10-CM | POA: Diagnosis not present

## 2022-11-24 DIAGNOSIS — R21 Rash and other nonspecific skin eruption: Secondary | ICD-10-CM

## 2022-11-24 MED ORDER — NYSTATIN 100000 UNIT/GM EX POWD
1.0000 | Freq: Three times a day (TID) | CUTANEOUS | 0 refills | Status: AC
Start: 2022-11-24 — End: ?

## 2022-11-24 NOTE — Patient Instructions (Signed)
Nice to see you today I have sent in a powder to use on the rash Keep the area clean and dry Dab dry when drying Clean gently with soap and water daily

## 2022-11-24 NOTE — Telephone Encounter (Signed)
Patient has office visit to address today.

## 2022-11-24 NOTE — Progress Notes (Addendum)
Acute Office Visit  Subjective:     Patient ID: Brad Brown, male    DOB: 11/12/1945, 77 y.o.   MRN: [id number]  Chief Complaint  Patient presents with   Rash    Pt complains of seeing a rash in left groin area 2-3 days ago. States that there was pain but not anymore. Redness is still there.      Patient is in today for rash with a history of DM2, HTN, CVA, CAD, Balanitis  States that he first noticed the rash on his right hand side of the groin. States that it was sore and hurt to walk. States that he was washing it with an over the counter solution that has helped some. States some itching. States pain was when he was walking and not there with rest.   States that he felt tingling but nothing now. States that he has had the shingles vaccine in the past. Both vaccinations   Review of Systems  Constitutional:  Negative for chills and fever.  Skin:  Positive for itching and rash.  Neurological:  Positive for tingling.        Objective:    BP 122/72   Pulse 73   Temp 97.8 F (36.6 C) (Temporal)   Ht 5\' 9"  (1.753 m)   Wt 192 lb 3.2 oz (87.2 kg)   SpO2 95%   BMI 28.38 kg/m  BP Readings from Last 3 Encounters:  11/24/22 122/72  09/22/22 (!) 144/76  09/04/22 132/64   Wt Readings from Last 3 Encounters:  11/24/22 192 lb 3.2 oz (87.2 kg)  09/22/22 194 lb (88 kg)  09/04/22 193 lb (87.5 kg)      Physical Exam Vitals and nursing note reviewed. Exam conducted with a chaperone present [employer], CMA).  Constitutional:      Appearance: Normal appearance.  Cardiovascular:     Rate and Rhythm: Normal rate and regular rhythm.     Heart sounds: Normal heart sounds.  Pulmonary:     Effort: Pulmonary effort is normal.     Breath sounds: Normal breath sounds.  Genitourinary:      Comments: Redness. No pain to palpation.  No discrete lesions  Neurological:     Mental Status: He is alert.     No results found for any visits on 11/24/22.       Assessment & Plan:   Problem List Items Addressed This Visit       Musculoskeletal and Integument   Rash - Primary    Defect this is a moisture driven rash with skin to the skin.  No acute signs of infection or shingles.  Will do nystatin powder.  Patient will try to keep the area clean and dry.  He will dab dry versus scrub dry.  He will also clean daily with soap and water gently.  He can have periods of time at home not wearing any underwear or pants if able follow-up if no improvement      Relevant Medications   nystatin (MYCOSTATIN/NYSTOP) powder     Genitourinary   Penile lesion    Cystlike structure on the right distal portion of the penis that is mobile and nontender tenderness.  Offered to refer patient to dermatology.       Meds ordered this encounter  Medications   nystatin (MYCOSTATIN/NYSTOP) powder    Sig: Apply 1 Application topically 3 (three) times daily.    Dispense:  15 g    Refill:  0  Order Specific Question:   Supervising Provider    Answer:   Roxy Manns A [[id number]]    Return for As scheduled for DM f/u.  Audria Nine, NP

## 2022-11-24 NOTE — Assessment & Plan Note (Addendum)
Cystlike structure on the right distal portion of the penis that is mobile and nontender tenderness.  Offered to refer patient to dermatology.

## 2022-11-24 NOTE — Assessment & Plan Note (Signed)
Defect this is a moisture driven rash with skin to the skin.  No acute signs of infection or shingles.  Will do nystatin powder.  Patient will try to keep the area clean and dry.  He will dab dry versus scrub dry.  He will also clean daily with soap and water gently.  He can have periods of time at home not wearing any underwear or pants if able follow-up if no improvement

## 2022-11-24 NOTE — Telephone Encounter (Signed)
Patient was seen and evaluated in office today  

## 2022-11-25 ENCOUNTER — Ambulatory Visit (INDEPENDENT_AMBULATORY_CARE_PROVIDER_SITE_OTHER): Payer: Medicare Other

## 2022-11-25 DIAGNOSIS — I255 Ischemic cardiomyopathy: Secondary | ICD-10-CM | POA: Diagnosis not present

## 2022-11-25 LAB — CUP PACEART REMOTE DEVICE CHECK
Battery Remaining Longevity: 44 mo
Battery Remaining Percentage: 43 %
Battery Voltage: 2.89 V
Brady Statistic RV Percent Paced: 1 %
Date Time Interrogation Session: 20240904065727
HighPow Impedance: 52 Ohm
HighPow Impedance: 52 Ohm
Implantable Lead Connection Status: 753985
Implantable Lead Implant Date: 20080527
Implantable Lead Location: 753860
Implantable Lead Model: 7121
Implantable Pulse Generator Implant Date: 20171201
Lead Channel Impedance Value: 430 Ohm
Lead Channel Pacing Threshold Amplitude: 1 V
Lead Channel Pacing Threshold Pulse Width: 0.5 ms
Lead Channel Sensing Intrinsic Amplitude: 9.4 mV
Lead Channel Setting Pacing Amplitude: 2.5 V
Lead Channel Setting Pacing Pulse Width: 0.5 ms
Lead Channel Setting Sensing Sensitivity: 0.5 mV
Pulse Gen Serial Number: 7283394
Zone Setting Status: 755011

## 2022-12-08 NOTE — Progress Notes (Signed)
Remote ICD transmission.   

## 2022-12-24 LAB — HEPATIC FUNCTION PANEL
ALT: 30 U/L (ref 10–40)
AST: 25 (ref 14–40)
Alkaline Phosphatase: 63 (ref 25–125)
Bilirubin, Direct: 0.2
Bilirubin, Total: 0.8

## 2022-12-24 LAB — BASIC METABOLIC PANEL
BUN: 15 (ref 4–21)
CO2: 26 — AB (ref 13–22)
Chloride: 106 (ref 99–108)
Creatinine: 1.5 — AB (ref 0.6–1.3)
Glucose: 154
Potassium: 4.5 meq/L (ref 3.5–5.1)
Sodium: 138 (ref 137–147)

## 2022-12-24 LAB — COMPREHENSIVE METABOLIC PANEL
Albumin: 4.6 (ref 3.5–5.0)
Calcium: 9.5 (ref 8.7–10.7)
eGFR: 50

## 2022-12-24 LAB — HEMOGLOBIN A1C: Hemoglobin A1C: 7.6

## 2022-12-24 LAB — VITAMIN D 25 HYDROXY (VIT D DEFICIENCY, FRACTURES): Vit D, 25-Hydroxy: 61.1

## 2022-12-24 LAB — PSA: PSA: 0.53

## 2022-12-25 ENCOUNTER — Other Ambulatory Visit: Payer: Self-pay | Admitting: Nurse Practitioner

## 2022-12-25 DIAGNOSIS — E1122 Type 2 diabetes mellitus with diabetic chronic kidney disease: Secondary | ICD-10-CM

## 2023-01-04 ENCOUNTER — Encounter: Payer: Self-pay | Admitting: Nurse Practitioner

## 2023-01-04 ENCOUNTER — Ambulatory Visit: Payer: Medicare Other | Admitting: Nurse Practitioner

## 2023-01-04 VITALS — BP 128/74 | HR 86 | Temp 98.2°F | Ht 69.0 in | Wt 190.2 lb

## 2023-01-04 DIAGNOSIS — Z7985 Long-term (current) use of injectable non-insulin antidiabetic drugs: Secondary | ICD-10-CM

## 2023-01-04 DIAGNOSIS — E1122 Type 2 diabetes mellitus with diabetic chronic kidney disease: Secondary | ICD-10-CM | POA: Diagnosis not present

## 2023-01-04 DIAGNOSIS — N181 Chronic kidney disease, stage 1: Secondary | ICD-10-CM

## 2023-01-04 DIAGNOSIS — I10 Essential (primary) hypertension: Secondary | ICD-10-CM

## 2023-01-04 DIAGNOSIS — Z7984 Long term (current) use of oral hypoglycemic drugs: Secondary | ICD-10-CM | POA: Diagnosis not present

## 2023-01-04 LAB — POCT GLYCOSYLATED HEMOGLOBIN (HGB A1C): Hemoglobin A1C: 7 % — AB (ref 4.0–5.6)

## 2023-01-04 NOTE — Patient Instructions (Signed)
Nice to see you today Your A1C is 7.0. [facility]. I will see you in 6 months for a physical, sooner if you need me

## 2023-01-04 NOTE — Assessment & Plan Note (Signed)
Patient currently maintained on losartan metoprolol.  Blood pressure under good control.  Patient denies lightheadedness or dizziness.  Continue medication as prescribed

## 2023-01-04 NOTE — Assessment & Plan Note (Signed)
Patient currently maintained on metformin 1000 g twice daily, Jardiance 25 mg, glipizide 10 mg twice daily, Ozempic.  Patient is being managed through the [place] will refer management of his chronic health conditions to the [place] and he can see me yearly for a physical and as needed for acute visits

## 2023-01-04 NOTE — Progress Notes (Signed)
Established Patient Office Visit  Subjective   Patient ID: Brad Brown, male    DOB: February 15, 1946  Age: 77 y.o. MRN: [id number]  Chief Complaint  Patient presents with   Diabetes    Pt states he is feeling pretty good.       DM2: Patient currently maintained on glipizide 10 mg twice daily, Jardiance 25 mg, metformin 1000 mg twice daily, ozempic 0.25 once a week. States that he has been on it for approx 6 months.   Patient traditionally checks sugars once a week. States that he has been 130-140s. States that he was recently seen at the [place] and had labs drawn by them   HTN: Patient currently maintained on losartan 100 mg, metoprolol 12.5 mg twice daily.  Checking blood pressure approximately every other day    Review of Systems  Constitutional:  Negative for chills and fever.  Respiratory:  Negative for shortness of breath.   Cardiovascular:  Negative for chest pain.  Gastrointestinal:  Negative for abdominal pain, constipation, diarrhea, nausea and vomiting.       BM daily  Neurological:  Negative for headaches.  Psychiatric/Behavioral:  Negative for hallucinations and suicidal ideas.       Objective:     BP 128/74   Pulse 86   Temp 98.2 F (36.8 C) (Oral)   Ht 5\' 9"  (1.753 m)   Wt 190 lb 3.2 oz (86.3 kg)   SpO2 98%   BMI 28.09 kg/m  BP Readings from Last 3 Encounters:  01/04/23 128/74  11/24/22 122/72  09/22/22 (!) 144/76   Wt Readings from Last 3 Encounters:  01/04/23 190 lb 3.2 oz (86.3 kg)  11/24/22 192 lb 3.2 oz (87.2 kg)  09/22/22 194 lb (88 kg)   SpO2 Readings from Last 3 Encounters:  01/04/23 98%  11/24/22 95%  09/22/22 96%      Physical Exam Vitals and nursing note reviewed.  Constitutional:      Appearance: Normal appearance.  Cardiovascular:     Rate and Rhythm: Normal rate and regular rhythm.     Heart sounds: Normal heart sounds.  Pulmonary:     Effort: Pulmonary effort is normal.     Breath sounds: Normal breath sounds.   Abdominal:     General: Bowel sounds are normal.  Neurological:     Mental Status: He is alert.      Results for orders placed or performed in visit on 01/04/23  POCT glycosylated hemoglobin (Hb A1C)  Result Value Ref Range   Hemoglobin A1C 7.0 (A) 4.0 - 5.6 %   HbA1c POC (<> result, manual entry)     HbA1c, POC (prediabetic range)     HbA1c, POC (controlled diabetic range)        The ASCVD Risk score (Arnett DK, et al., 2019) failed to calculate for the following reasons:   The patient has a prior MI or stroke diagnosis    Assessment & Plan:   Problem List Items Addressed This Visit       Cardiovascular and Mediastinum   Hypertension    Patient currently maintained on losartan metoprolol.  Blood pressure under good control.  Patient denies lightheadedness or dizziness.  Continue medication as prescribed        Endocrine   Type 2 diabetes mellitus with stage 1 chronic kidney disease, without long-term current use of insulin (HCC) - Primary    Patient currently maintained on metformin 1000 g twice daily, Jardiance 25 mg, glipizide 10 mg  twice daily, Ozempic.  Patient is being managed through the [place] will refer management of his chronic health conditions to the [place] and he can see me yearly for a physical and as needed for acute visits      Relevant Medications   Empagliflozin-metFORMIN HCl ER 12.07-998 MG TB24   Semaglutide,0.25 or 0.5MG /DOS, 2 MG/3ML SOPN   Semaglutide, 1 MG/DOSE, 4 MG/3ML SOPN   Other Relevant Orders   POCT glycosylated hemoglobin (Hb A1C) (Completed)    Return in about 6 months (around 07/05/2023) for CPE and Labs.    Audria Nine, NP

## 2023-01-05 ENCOUNTER — Encounter: Payer: Self-pay | Admitting: Nurse Practitioner

## 2023-01-13 ENCOUNTER — Telehealth: Payer: Self-pay

## 2023-01-13 NOTE — Telephone Encounter (Signed)
Noted. It looks like the patient has an appointment scheduled with me on 01/14/2023

## 2023-01-13 NOTE — Telephone Encounter (Signed)
Left v/m requesting pt cb for appt.sending note to Audria Nine NP and [employer].

## 2023-01-14 ENCOUNTER — Encounter: Payer: Self-pay | Admitting: Nurse Practitioner

## 2023-01-14 ENCOUNTER — Ambulatory Visit (INDEPENDENT_AMBULATORY_CARE_PROVIDER_SITE_OTHER)
Admission: RE | Admit: 2023-01-14 | Discharge: 2023-01-14 | Disposition: A | Discharge status: Home / Self Care | Payer: Medicare Other | Source: Ambulatory Visit | Attending: Nurse Practitioner | Admitting: Nurse Practitioner

## 2023-01-14 ENCOUNTER — Ambulatory Visit (INDEPENDENT_AMBULATORY_CARE_PROVIDER_SITE_OTHER): Payer: Medicare Other | Admitting: Nurse Practitioner

## 2023-01-14 VITALS — BP 112/74 | HR 87 | Temp 97.8°F | Ht 69.0 in | Wt 188.8 lb

## 2023-01-14 DIAGNOSIS — Z95810 Presence of automatic (implantable) cardiac defibrillator: Secondary | ICD-10-CM | POA: Diagnosis not present

## 2023-01-14 DIAGNOSIS — J441 Chronic obstructive pulmonary disease with (acute) exacerbation: Secondary | ICD-10-CM

## 2023-01-14 DIAGNOSIS — R051 Acute cough: Secondary | ICD-10-CM | POA: Diagnosis not present

## 2023-01-14 DIAGNOSIS — R059 Cough, unspecified: Secondary | ICD-10-CM | POA: Diagnosis not present

## 2023-01-14 DIAGNOSIS — I700 Atherosclerosis of aorta: Secondary | ICD-10-CM | POA: Diagnosis not present

## 2023-01-14 DIAGNOSIS — J449 Chronic obstructive pulmonary disease, unspecified: Secondary | ICD-10-CM | POA: Diagnosis not present

## 2023-01-14 LAB — POC COVID19 BINAXNOW: SARS Coronavirus 2 Ag: NEGATIVE

## 2023-01-14 MED ORDER — PREDNISONE 20 MG PO TABS
ORAL_TABLET | ORAL | 0 refills | Status: AC
Start: 2023-01-14 — End: 2023-01-20

## 2023-01-14 MED ORDER — AMOXICILLIN-POT CLAVULANATE 875-125 MG PO TABS
1.0000 | ORAL_TABLET | Freq: Two times a day (BID) | ORAL | 0 refills | Status: AC
Start: 2023-01-14 — End: 2023-01-21

## 2023-01-14 MED ORDER — OMEPRAZOLE 20 MG PO CPDR
20.0000 mg | DELAYED_RELEASE_CAPSULE | Freq: Every day | ORAL | 0 refills | Status: DC
Start: 2023-01-14 — End: 2024-01-21

## 2023-01-14 NOTE — Assessment & Plan Note (Signed)
COVID test in office.

## 2023-01-14 NOTE — Assessment & Plan Note (Signed)
Will treat with Augmentin 875-125 mg twice daily for 7 days.  Also steroid taper.  Pending chest x-ray COVID test was negative in office.  Continue in inhalers as instructed and needed

## 2023-01-14 NOTE — Patient Instructions (Signed)
Nice to see you today I will be in touch with the xray results once I have them Follow up with me as needed

## 2023-01-14 NOTE — Progress Notes (Signed)
Established Patient Office Visit  Subjective   Patient ID: Brad Brown, male    DOB: 1945/10/31  Age: 77 y.o. MRN: [id number]  Chief Complaint  Patient presents with   Cough    Pt complains of head and chest cold. States mucus is brown color. No chest pain. Slight wheezing. No Fevers or chills.  Started 2-3 days ago.        History of COPD, HTN, CAD, DM2  Sick symptoms: symptoms started 3 days ago.  Covid vaccines: TUD Pna vaccine uTD Flu vaccine: UTD  States that he is having a deep cough and some sinus drainage. He is havin sputum that is colored. Sttes that he has some shortness of rbeath but nothing more than normal. States that he had a cold and flu cough syrup that has helped. States that his symptoms have stayed the same States that he is wheezing at home and having to use the albuterol inhaler       Review of Systems  Constitutional:  Negative for chills, fever and malaise/fatigue.  HENT:  Positive for sore throat (improved). Negative for ear discharge, ear pain and sinus pain.   Respiratory:  Positive for cough, sputum production and shortness of breath.   Cardiovascular:  Negative for chest pain.  Gastrointestinal:  Negative for abdominal pain, diarrhea, nausea and vomiting.  Musculoskeletal:  Negative for joint pain and myalgias.  Neurological:  Negative for headaches.      Objective:     BP 112/74   Pulse 87   Temp 97.8 F (36.6 C) (Oral)   Ht 5\' 9"  (1.753 m)   Wt 188 lb 12.8 oz (85.6 kg)   SpO2 93%   BMI 27.88 kg/m    Physical Exam Vitals and nursing note reviewed.  Constitutional:      Appearance: Normal appearance.  Cardiovascular:     Rate and Rhythm: Normal rate and regular rhythm.     Heart sounds: Normal heart sounds.  Pulmonary:     Effort: Pulmonary effort is normal.     Breath sounds: Rhonchi and rales (LLL) present.  Neurological:     Mental Status: He is alert.      Results for orders placed or performed in visit on  01/14/23  POC COVID-19  Result Value Ref Range   SARS Coronavirus 2 Ag Negative Negative      The ASCVD Risk score (Arnett DK, et al., 2019) failed to calculate for the following reasons:   The patient has a prior MI or stroke diagnosis    Assessment & Plan:   Problem List Items Addressed This Visit       Respiratory   COPD exacerbation (HCC)    Will treat with Augmentin 875-125 mg twice daily for 7 days.  Also steroid taper.  Pending chest x-ray COVID test was negative in office.  Continue in inhalers as instructed and needed      Relevant Medications   amoxicillin-clavulanate (AUGMENTIN) 875-125 MG tablet   predniSONE (DELTASONE) 20 MG tablet   omeprazole (PRILOSEC) 20 MG capsule     Other   Acute cough - Primary    COVID test in office.      Relevant Orders   POC COVID-19 (Completed)   DG Chest 2 View (Completed)    Return if symptoms worsen or fail to improve.    Audria Nine, NP

## 2023-02-20 ENCOUNTER — Other Ambulatory Visit: Payer: Self-pay | Admitting: Nurse Practitioner

## 2023-02-24 ENCOUNTER — Ambulatory Visit: Payer: Medicare Other

## 2023-02-24 ENCOUNTER — Telehealth: Payer: Self-pay | Admitting: Internal Medicine

## 2023-02-24 DIAGNOSIS — I255 Ischemic cardiomyopathy: Secondary | ICD-10-CM | POA: Diagnosis not present

## 2023-02-24 NOTE — Telephone Encounter (Signed)
Attempted to call patient. No answer. Left detailed message on VM about remote transmission. Advised to call with further questions. Direct dial left.

## 2023-02-24 NOTE — Telephone Encounter (Signed)
Normal remote transmission. Will call patient soon to let know.

## 2023-02-24 NOTE — Telephone Encounter (Signed)
Patient would like to know if his ICD transmission has for today been read. He states he would like a call back to discuss.

## 2023-02-25 ENCOUNTER — Ambulatory Visit: Payer: Medicare Other

## 2023-02-25 VITALS — Ht 69.0 in | Wt 185.0 lb

## 2023-02-25 DIAGNOSIS — Z0000 Encounter for general adult medical examination without abnormal findings: Secondary | ICD-10-CM

## 2023-02-25 LAB — CUP PACEART REMOTE DEVICE CHECK
Battery Remaining Longevity: 43 mo
Battery Remaining Percentage: 42 %
Battery Voltage: 2.89 V
Brady Statistic RV Percent Paced: 1 %
Date Time Interrogation Session: 20241204124441
HighPow Impedance: 50 Ohm
HighPow Impedance: 51 Ohm
Implantable Lead Connection Status: 753985
Implantable Lead Implant Date: 20080527
Implantable Lead Location: 753860
Implantable Lead Model: 7121
Implantable Pulse Generator Implant Date: 20171201
Lead Channel Impedance Value: 430 Ohm
Lead Channel Pacing Threshold Amplitude: 1 V
Lead Channel Pacing Threshold Pulse Width: 0.5 ms
Lead Channel Sensing Intrinsic Amplitude: 8.6 mV
Lead Channel Setting Pacing Amplitude: 2.5 V
Lead Channel Setting Pacing Pulse Width: 0.5 ms
Lead Channel Setting Sensing Sensitivity: 0.5 mV
Pulse Gen Serial Number: 7283394
Zone Setting Status: 755011

## 2023-02-25 NOTE — Progress Notes (Signed)
Subjective:   Brad Brown is a 77 y.o. male who presents for Medicare Annual/Subsequent preventive examination.  Visit Complete: Virtual I connected with  Brad Brown on 02/25/23 by a audio enabled telemedicine application and verified that I am speaking with the correct person using two identifiers.  Patient Location: Home  Provider Location: Home Office  I discussed the limitations of evaluation and management by telemedicine. The patient expressed understanding and agreed to proceed.  Vital Signs: Because this visit was a virtual/telehealth visit, some criteria may be missing or patient reported. Any vitals not documented were not able to be obtained and vitals that have been documented are patient reported.  Patient Medicare AWV questionnaire was completed by the patient on (not done); I have confirmed that all information answered by patient is correct and no changes since this date.  Cardiac Risk Factors include: advanced age (>67men, >40 women);diabetes mellitus;dyslipidemia;hypertension;male gender   Objective:    Today's Vitals   02/25/23 1434  Weight: 185 lb (83.9 kg)  Height: 5\' 9"  (1.753 m)   Body mass index is 27.32 kg/m.     02/25/2023    2:45 PM 03/10/2022    3:41 PM 07/14/2021    7:05 PM 10/17/2018    1:59 PM 02/06/2016   10:46 AM 08/17/2013    2:30 PM  Advanced Directives  Does Patient Have a Medical Advance Directive? Yes Yes No Yes No Patient has advance directive, copy in chart;Patient has advance directive, copy not in chart  Type of Advance Directive Living will;Healthcare Power of [address] Living will  Healthcare Power of [place];Living will    Does patient want to make changes to medical advance directive?  No - Patient declined  No - Patient declined    Copy of Healthcare Power of Attorney in Chart? No - copy requested   No - copy requested    Would patient like information on creating a medical advance directive?     No - patient declined  information     Current Medications (verified) Outpatient Encounter Medications as of 02/25/2023  Medication Sig   acetaminophen (TYLENOL) 325 MG tablet Take 650 mg by mouth 4 (four) times daily as needed.   albuterol (VENTOLIN HFA) 108 (90 Base) MCG/ACT inhaler TAKE 2 PUFFS BY MOUTH EVERY 6 HOURS AS NEEDED FOR WHEEZE OR SHORTNESS OF BREATH   baclofen (LIORESAL) 10 MG tablet Take 1 tablet (10 mg total) by mouth 2 (two) times daily as needed for muscle spasms.   blood glucose meter kit and supplies Check sugar twice daily. DX E11.22   Cholecalciferol 50 MCG (2000 UT) TABS Take 1 tablet by mouth daily.   ciclopirox (LOPROX) 0.77 % cream Apply 1 application topically 2 (two) times daily as needed (for rash).    Clotrimazole 1 % OINT Apply 1 application topically daily.   Continuous Blood Gluc Receiver (FREESTYLE LIBRE 14 DAY READER) DEVI 1 each by Does not apply route as directed.   doxycycline (VIBRA-TABS) 100 MG tablet Take 1 tablet (100 mg total) by mouth 2 (two) times daily.   empagliflozin (JARDIANCE) 25 MG TABS tablet Take 12.5 mg by mouth daily.   Empagliflozin-metFORMIN HCl ER 12.07-998 MG TB24 Take by mouth.   Ginkgo Biloba 60 MG TABS Take by mouth.   glipiZIDE (GLUCOTROL) 10 MG tablet TAKE 1 TABLET (10 MG TOTAL) BY MOUTH IN THE MORNING AND AT BEDTIME   HYDROcodone-acetaminophen (NORCO) 5-325 MG tablet Take 1-2 tablets by mouth every 6 (six) hours as  needed for severe pain (Take tylenol for mild and moderate pain).   levETIRAcetam (KEPPRA) 500 MG tablet 1 in the morning and 2 in the afternoon   losartan (COZAAR) 100 MG tablet Take 50 mg by mouth daily.   metFORMIN (GLUCOPHAGE) 1000 MG tablet TAKE 1 TABLET BY MOUTH TWICE A DAY WITH FOOD   metoprolol tartrate (LOPRESSOR) 25 MG tablet Take 12.5 mg by mouth 2 (two) times daily.   Multiple Vitamin (MULTIVITAMIN) tablet Take 1 tablet by mouth daily.   Niacin, Antihyperlipidemic, 500 MG TABS Take 500 mg by mouth daily.   nystatin  (MYCOSTATIN/NYSTOP) powder Apply 1 Application topically 3 (three) times daily.   nystatin-triamcinolone (MYCOLOG II) cream APPLY SMALL AMOUNT TO AFFECTED AREA TWICE A DAY   Omega-3 Fatty Acids (FISH OIL) 1200 MG CAPS Take 1,200 mg by mouth daily.    omeprazole (PRILOSEC) 20 MG capsule Take 1 capsule (20 mg total) by mouth daily.   rivaroxaban (XARELTO) 20 MG TABS tablet Take 20 mg by mouth daily with supper.   Semaglutide, 1 MG/DOSE, 4 MG/3ML SOPN Inject into the skin.   Semaglutide,0.25 or 0.5MG /DOS, 2 MG/3ML SOPN Inject into the skin.   senna (SENOKOT) 8.6 MG TABS tablet Take 1 tablet (8.6 mg total) by mouth 2 (two) times daily.   simvastatin (ZOCOR) 20 MG tablet Take 10 mg by mouth at bedtime.    SYMBICORT 160-4.5 MCG/ACT inhaler INHALE 2 PUFFS INTO THE LUNGS TWICE A DAY   No facility-administered encounter medications on file as of 02/25/2023.    Allergies (verified) Ace inhibitors and Lipitor [atorvastatin]   History: Past Medical History:  Diagnosis Date   Acute myocardial infarction [facility]) 06/1983   Arthritis    Atrial fibrillation (HCC)    Automatic implantable cardiac defibrillator in situ    Borderline diabetes mellitus    BPH without obstruction/lower urinary tract symptoms    CAD (coronary artery disease)    Chronic kidney disease, stage 1    Colonic polyp 2004   hyperplastic    COPD (chronic obstructive pulmonary disease) (HCC)    Diabetes mellitus without complication (HCC)    Fall    GERD (gastroesophageal reflux disease)    History of renal insufficiency syndrome    HTN (hypertension)    Hyperlipidemia    Knee derangement 09/2018   LEFT KNEE   Monoclonal gammopathy    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 Jude single chamber defibrillator   COLONOSCOPY  07/26/2019   EP IMPLANTABLE DEVICE N/A 02/21/2016   Procedure: ICD Generator Changeout;  Surgeon: Marinus Maw, MD;  Location: [facility] INVASIVE CV LAB;  Service: Cardiovascular;  Laterality: N/A;   INGUINAL HERNIA REPAIR Left    QUADRICEPS TENDON REPAIR Left 10/20/2018   Procedure: REPAIR QUADRICEP TENDON;  Surgeon: Sheral Apley, MD;  Location: [facility] OR;  Service: Orthopedics;  Laterality: Left;   Family History  Problem Relation Age of Onset   Heart attack Mother    Colon cancer Brother    Heart attack Brother    Seizures Neg Hx    Esophageal cancer Neg Hx    Pancreatic cancer Neg Hx    Rectal cancer Neg  Hx    Stomach cancer Neg Hx    Liver cancer Neg Hx    Social History   Socioeconomic History   Marital status: Single    Spouse name: Not on file   Number of children: 1   Years of education: Not on file   Highest education level: Not on file  Occupational History   Occupation: retired    Associate Professor: RETIRED  Tobacco Use   Smoking status: Former    Current packs/day: 0.00    Average packs/day: 0.5 packs/day for 15.0 years (7.5 ttl pk-yrs)    Types: Cigarettes    Start date: 03/23/1989    Quit date: 03/23/2004    Years since quitting: 18.9   Smokeless tobacco: Never  Vaping Use   Vaping status: Never Used  Substance and Sexual Activity   Alcohol use: Not Currently    Alcohol/week: 1.0 standard drink of alcohol    Types: 1 Glasses of wine per week    Comment: rare--wine   Drug use: No   Sexual activity: Not Currently  Other Topics Concern   Not on file  Social History Narrative   ICD-St. Jude  Remote-Yes      Live currently @ [facility] for rehabilitation. Plans to discharge to his daughter's house for continuation of rehab.   Social Determinants of Health   Financial Resource Strain: Low Risk  (02/25/2023)   Overall Financial Resource Strain (CARDIA)    Difficulty of Paying Living  Expenses: Not very hard  Food Insecurity: No Food Insecurity (02/25/2023)   Hunger Vital Sign    Worried About Running Out of Food in the Last Year: Never true    Ran Out of Food in the Last Year: Never true  Transportation Needs: No Transportation Needs (02/25/2023)   PRAPARE - Administrator, Civil Service (Medical): No    Lack of Transportation (Non-Medical): No  Physical Activity: Sufficiently Active (02/25/2023)   Exercise Vital Sign    Days of Exercise per Week: 6 days    Minutes of Exercise per Session: 30 min  Stress: No Stress Concern Present (02/25/2023)   [employer] of Occupational Health - Occupational Stress Questionnaire    Feeling of Stress : Not at all  Social Connections: Moderately Isolated (02/25/2023)   Social Connection and Isolation Panel [NHANES]    Frequency of Communication with Friends and Family: More than three times a week    Frequency of Social Gatherings with Friends and Family: More than three times a week    Attends Religious Services: More than 4 times per year    Active Member of [employer] or Organizations: No    Attends Engineer, structural: Never    Marital Status: Divorced    Tobacco Counseling Counseling given: Not Answered   Clinical Intake:  Pre-visit preparation completed: No  Pain : No/denies pain   BMI - recorded: 27.32 Nutritional Status: BMI 25 -29 Overweight Nutritional Risks: None Diabetes: Yes CBG done?: No Did pt. bring in CBG monitor from home?: No  How often do you need to have someone help you when you read instructions, pamphlets, or other written materials from your doctor or pharmacy?: 1 - Never  Interpreter Needed?: No  Comments: lives alone Information entered by :: B.Keslyn Teater,LPN   Activities of Daily Living    02/25/2023    2:46 PM 03/10/2022    3:42 PM  In your present state of health, do you have any difficulty performing the following activities:  Hearing? 0 0  Vision? 0 0   Difficulty concentrating or making decisions? 0 0  Walking or climbing stairs? 0 0  Dressing or bathing? 0 0  Doing errands, shopping? 0 0  Preparing Food and eating ? N N  Using the Toilet? N N  In the past six months, have you accidently leaked urine? N N  Do you have problems with loss of bowel control? N N  Managing your Medications? N N  Managing your Finances? N N  Housekeeping or managing your Housekeeping? [place]  Comment  Maid assist    Patient Care Team: Eden Emms, NP as PCP - General Marinus Maw, MD as PCP - Cardiology (Cardiology) Diona Foley, MD as Consulting Physician (Ophthalmology)  Indicate any recent Medical Services you may have received from other than [facility] providers in the past year (date may be approximate).     Assessment:   This is a routine wellness examination for [place].  Hearing/Vision screen Hearing Screening - Comments:: Pt says no problem with hearing Vision Screening - Comments:: Pt says vision is good:readers after cataract surgery In [place]   Goals Addressed             This Visit's Progress    COMPLETED: Maintain healthy lifestyle   On track    Stay active Healthy diet        Depression Screen    02/25/2023    2:42 PM 01/04/2023    2:12 PM 09/04/2022    1:57 PM 05/29/2022    2:03 PM 03/10/2022    3:38 PM 11/21/2020   11:54 AM 05/30/2019   11:30 AM  PHQ 2/9 Scores  PHQ - 2 Score 0 0 3 0 0 3 0  PHQ- 9 Score  0 3 0  3     Fall Risk    02/25/2023    2:38 PM 01/04/2023    2:12 PM 05/29/2022    2:03 PM 03/10/2022    3:42 PM 12/30/2021   11:10 AM  Fall Risk   Falls in the past year? 0 0 0 0 0  Number falls in past yr: 0 0 0 0 0  Injury with Fall? 0 0 0 0 0  Risk for fall due to : No Fall Risks  No Fall Risks No Fall Risks   Follow up Education provided;Falls prevention discussed Falls evaluation completed Falls evaluation completed Falls evaluation completed     MEDICARE RISK AT HOME: Medicare Risk at  Home Any stairs in or around the home?: Yes If so, are there any without handrails?: Yes Home free of loose throw rugs in walkways, pet beds, electrical cords, etc?: Yes Adequate lighting in your home to reduce risk of falls?: Yes Life alert?: Yes Use of a cane, walker or w/c?: No Grab bars in the bathroom?: Yes Shower chair or bench in shower?: No Elevated toilet seat or a handicapped toilet?: Yes  TIMED UP AND GO:  Was the test performed?  No    Cognitive Function:        02/25/2023    2:47 PM 03/10/2022    3:43 PM  6CIT Screen  What Year? 0 points 0 points  What month? 0 points 0 points  What time? 0 points 0 points  Count back from 20 0 points 0 points  Months in reverse 0 points 4 points  Repeat phrase 0 points 0 points  Total Score 0 points 4 points    Immunizations Immunization History  Administered Date(s) Administered   Fluad Quad(high Dose 65+) 11/28/2018, 11/21/2020   Influenza Split 01/04/2011, 01/13/2012   Influenza Whole 01/02/2009, 12/09/2009   Influenza, High Dose Seasonal PF 12/23/2015, 12/23/2015, 12/18/2016, 12/13/2017, 11/19/2021, 11/30/2022   Influenza,inj,Quad PF,6+ Mos 12/14/2012   Influenza-Unspecified 12/22/2013, 11/06/2014   PFIZER(Purple Top)SARS-COV-2 Vaccination 05/20/2019, 06/06/2019, 11/30/2022   PNEUMOCOCCAL CONJUGATE-20 10/17/2021   Pfizer Covid-19 Vaccine Bivalent Booster 76yrs & up 12/23/2019, 01/13/2021, 12/12/2021   Pfizer(Comirnaty)Fall Seasonal Vaccine 12 years and older 11/30/2022   Pneumococcal Conjugate-13 08/30/2014, 11/06/2014, 12/23/2015   Pneumococcal Polysaccharide-23 01/11/2008, 08/25/2013   Td 10/25/2009   Tdap 08/25/2013, 12/24/2022   Zoster Recombinant(Shingrix) 05/10/2018, 07/12/2019, 11/21/2020, 01/22/2021    TDAP status: Up to date  Flu Vaccine status: Up to date  Pneumococcal vaccine status: Up to date  Covid-19 vaccine status: Completed vaccines  Qualifies for Shingles Vaccine? Yes   Zostavax completed  Yes   Shingrix Completed?: Yes  Screening Tests Health Maintenance  Topic Date Due   FOOT EXAM  01/30/2023   COVID-19 Vaccine (7 - 2023-24 season) 03/13/2023 (Originally 01/25/2023)   Diabetic kidney evaluation - Urine ACR  05/21/2023 (Originally 01/30/2023)   OPHTHALMOLOGY EXAM  03/13/2023   HEMOGLOBIN A1C  07/05/2023   Diabetic kidney evaluation - eGFR measurement  12/24/2023   Medicare Annual Wellness (AWV)  02/25/2024   DTaP/Tdap/Td (4 - Td or Tdap) 12/23/2032   Pneumonia Vaccine 35+ Years old  Completed   INFLUENZA VACCINE  Completed   Hepatitis C Screening  Completed   Zoster Vaccines- Shingrix  Completed   HPV VACCINES  Aged Out   Colonoscopy  Discontinued    Health Maintenance  Health Maintenance Due  Topic Date Due   FOOT EXAM  01/30/2023    Colorectal cancer screening: No longer required.   Lung Cancer Screening: (Low Dose CT Chest recommended if Age 36-80 years, 20 pack-year currently smoking OR have quit w/in 15years.) does not qualify.   Lung Cancer Screening Referral: no  Additional Screening:  Hepatitis C Screening: does not qualify; Completed 04/07/2016  Vision Screening: Recommended annual ophthalmology exams for early detection of glaucoma and other disorders of the eye. Is the patient up to date with their annual eye exam?  Yes  Who is the provider or what is the name of the office in which the patient attends annual eye exams? Does not remember name If pt is not established with a provider, would they like to be referred to a provider to establish care? No .   Dental Screening: Recommended annual dental exams for proper oral hygiene  Diabetic Foot Exam: Diabetic Foot Exam: Overdue, Pt has been advised about the importance in completing this exam. Pt is scheduled for diabetic foot exam on next PCP visit.  Community Resource Referral / Chronic Care Management: CRR required this visit?  No   CCM required this visit?  No    Plan:     I have  personally reviewed and noted the following in the patient's chart:   Medical and social history Use of alcohol, tobacco or illicit drugs  Current medications and supplements including opioid prescriptions. Patient is not currently taking opioid prescriptions. Functional ability and status Nutritional status Physical activity Advanced directives List of other physicians Hospitalizations, surgeries, and ER visits in previous 12 months Vitals Screenings to include cognitive, depression, and falls Referrals and appointments  In addition, I have reviewed and discussed with patient certain preventive protocols, quality metrics, and best practice recommendations. A written personalized care plan for preventive  services as well as general preventive health recommendations were provided to patient.     Sue Lush, LPN   16/03/958   After Visit Summary: (Declined) Due to this being a telephonic visit, with patients personalized plan was offered to patient but patient Declined AVS at this time   Nurse Notes: The patient states he is doing well and has no concerns or questions at this time.

## 2023-03-01 NOTE — Patient Instructions (Signed)
[name] , Thank you for taking time to come for your Medicare Wellness Visit. I appreciate your ongoing commitment to your health goals. Please review the following plan we discussed and let me know if I can assist you in the future.   Referrals/Orders/Follow-Ups/Clinician Recommendations: none  This is a list of the screening recommended for you and due dates:  Health Maintenance  Topic Date Due   Complete foot exam   01/30/2023   COVID-19 Vaccine (7 - 2023-24 season) 03/13/2023*   Yearly kidney health urinalysis for diabetes  05/21/2023*   Eye exam for diabetics  03/13/2023   Hemoglobin A1C  07/05/2023   Yearly kidney function blood test for diabetes  12/24/2023   Medicare Annual Wellness Visit  02/25/2024   DTaP/Tdap/Td vaccine (4 - Td or Tdap) 12/23/2032   Pneumonia Vaccine  Completed   Flu Shot  Completed   Hepatitis C Screening  Completed   Zoster (Shingles) Vaccine  Completed   HPV Vaccine  Aged Out   Colon Cancer Screening  Discontinued  *Topic was postponed. The date shown is not the original due date.    Advanced directives: (Copy Requested) Please bring a copy of your health care power of attorney and living will to the office to be added to your chart at your convenience.  Next Medicare Annual Wellness Visit scheduled for next year: Yes

## 2023-03-01 NOTE — Progress Notes (Signed)
The visit was not in person but via telephone

## 2023-03-04 ENCOUNTER — Encounter: Payer: Self-pay | Admitting: Nurse Practitioner

## 2023-03-04 ENCOUNTER — Other Ambulatory Visit: Payer: Self-pay

## 2023-05-26 ENCOUNTER — Ambulatory Visit (INDEPENDENT_AMBULATORY_CARE_PROVIDER_SITE_OTHER): Payer: Medicare Other

## 2023-05-26 DIAGNOSIS — I255 Ischemic cardiomyopathy: Secondary | ICD-10-CM | POA: Diagnosis not present

## 2023-05-28 LAB — CUP PACEART REMOTE DEVICE CHECK
Battery Remaining Longevity: 38 mo
Battery Remaining Percentage: 38 %
Battery Voltage: 2.86 V
Brady Statistic RV Percent Paced: 1 %
Date Time Interrogation Session: 20250305092835
HighPow Impedance: 50 Ohm
HighPow Impedance: 51 Ohm
Implantable Lead Connection Status: 753985
Implantable Lead Implant Date: 20080527
Implantable Lead Location: 753860
Implantable Lead Model: 7121
Implantable Pulse Generator Implant Date: 20171201
Lead Channel Impedance Value: 430 Ohm
Lead Channel Pacing Threshold Amplitude: 1 V
Lead Channel Pacing Threshold Pulse Width: 0.5 ms
Lead Channel Sensing Intrinsic Amplitude: 9.8 mV
Lead Channel Setting Pacing Amplitude: 2.5 V
Lead Channel Setting Pacing Pulse Width: 0.5 ms
Lead Channel Setting Sensing Sensitivity: 0.5 mV
Pulse Gen Serial Number: 7283394
Zone Setting Status: 755011

## 2023-06-24 ENCOUNTER — Other Ambulatory Visit: Payer: Self-pay | Admitting: Nurse Practitioner

## 2023-07-06 ENCOUNTER — Encounter: Payer: Medicare Other | Admitting: Nurse Practitioner

## 2023-07-06 NOTE — Progress Notes (Deleted)
   Established Patient Office Visit  Subjective   Patient ID: Brad Brown, male    DOB: 16-Apr-1945  Age: 78 y.o. MRN: [id number]  No chief complaint on file.   HPI  DM2: Patient currently maintained on Ozempic, metformin, glipizide, and Jardiance  HLD: Patient currently maintained on simvastatin 20 mg daily.  COPD: Patient currently maintained on albuterol as needed and Symbicort  HTN: Patient currently maintained on losartan 100 mg, metoprolol 12.5 mg twice daily  Seizures patient currently maintained on levetiracetam 1 in the morning and 2 in the evening.  for complete physical and follow up of chronic conditions.  Immunizations: -Tetanus: Completed in 2024 -Influenza: 11/30/2022 -Shingles: Completed Shingrix series -Pneumonia: Completed   Diet: Fair diet.  Exercise: No regular exercise.  Eye exam: Completes annually  Dental exam: Completes semi-annually    Colonoscopy: Completed in 07/22/2022, no repeat colonoscopy due to age and comorbidities Lung Cancer Screening: Completed in   PSA: Due     {History (Optional):23778}  ROS    Objective:     There were no vitals taken for this visit. {Vitals History (Optional):23777}  Physical Exam   No results found for any visits on 07/06/23.  {Labs (Optional):23779}  The ASCVD Risk score (Arnett DK, et al., 2019) failed to calculate for the following reasons:   Risk score cannot be calculated because patient has a medical history suggesting prior/existing ASCVD    Assessment & Plan:   Problem List Items Addressed This Visit   None   No follow-ups on file.    Margarie Shay, NP

## 2023-07-09 NOTE — Progress Notes (Signed)
 Remote ICD transmission.

## 2023-07-09 NOTE — Addendum Note (Signed)
 Addended by: Lott Rouleau A on: 07/09/2023 12:30 PM   Modules accepted: Orders

## 2023-08-25 ENCOUNTER — Ambulatory Visit: Payer: Medicare Other

## 2023-09-01 ENCOUNTER — Inpatient Hospital Stay: Payer: Medicare Other | Attending: Internal Medicine

## 2023-09-01 ENCOUNTER — Other Ambulatory Visit: Payer: Self-pay | Admitting: Medical Oncology

## 2023-09-01 DIAGNOSIS — D472 Monoclonal gammopathy: Secondary | ICD-10-CM

## 2023-09-08 ENCOUNTER — Inpatient Hospital Stay: Payer: Medicare Other | Admitting: Internal Medicine

## 2023-09-27 ENCOUNTER — Ambulatory Visit (INDEPENDENT_AMBULATORY_CARE_PROVIDER_SITE_OTHER)

## 2023-09-27 DIAGNOSIS — I255 Ischemic cardiomyopathy: Secondary | ICD-10-CM

## 2023-09-28 LAB — CUP PACEART REMOTE DEVICE CHECK
Battery Remaining Longevity: 35 mo
Battery Remaining Percentage: 33 %
Battery Voltage: 2.84 V
Brady Statistic RV Percent Paced: 1 %
Date Time Interrogation Session: 20250705015024
HighPow Impedance: 53 Ohm
HighPow Impedance: 53 Ohm
Implantable Lead Connection Status: 753985
Implantable Lead Implant Date: 20080527
Implantable Lead Location: 753860
Implantable Lead Model: 7121
Implantable Pulse Generator Implant Date: 20171201
Lead Channel Impedance Value: 450 Ohm
Lead Channel Pacing Threshold Amplitude: 1 V
Lead Channel Pacing Threshold Pulse Width: 0.5 ms
Lead Channel Sensing Intrinsic Amplitude: 12 mV
Lead Channel Setting Pacing Amplitude: 2.5 V
Lead Channel Setting Pacing Pulse Width: 0.5 ms
Lead Channel Setting Sensing Sensitivity: 0.5 mV
Pulse Gen Serial Number: 7283394
Zone Setting Status: 755011

## 2023-09-30 ENCOUNTER — Ambulatory Visit: Payer: Self-pay | Admitting: Internal Medicine

## 2023-11-16 DIAGNOSIS — E119 Type 2 diabetes mellitus without complications: Secondary | ICD-10-CM | POA: Diagnosis not present

## 2023-11-16 DIAGNOSIS — H35033 Hypertensive retinopathy, bilateral: Secondary | ICD-10-CM | POA: Diagnosis not present

## 2023-11-16 DIAGNOSIS — H25812 Combined forms of age-related cataract, left eye: Secondary | ICD-10-CM | POA: Diagnosis not present

## 2023-11-16 DIAGNOSIS — H26491 Other secondary cataract, right eye: Secondary | ICD-10-CM | POA: Diagnosis not present

## 2023-11-16 DIAGNOSIS — H524 Presbyopia: Secondary | ICD-10-CM | POA: Diagnosis not present

## 2023-11-16 LAB — HM DIABETES EYE EXAM

## 2023-11-18 ENCOUNTER — Other Ambulatory Visit: Payer: Self-pay | Admitting: Nurse Practitioner

## 2023-11-23 ENCOUNTER — Telehealth: Payer: Self-pay

## 2023-11-23 NOTE — Telephone Encounter (Signed)
 Alert received:  Alert remote transmission: High Voltage Therapy delivered. 1 VT-2 classified episode on 10/17/23 at 10:53 pm of 30 sec duration, EGM c/w VT appropriately treated with ATP x 2 bursts before being terminated by one 30J shock, avg V rate 210 bpm. Routed to clinic for review.   Outreach made to Pt.  Advised transmission had been received and he had an episode of potential VT noted.  Pt states he vaguely recalls a reaction.  States his neck and forehead turned red.  Advised Pt would evaluate with Dr. Waddell and call back. Also made overdue follow up with EP APP for December 06, 2023 at 10:20 per Pt.

## 2023-11-24 ENCOUNTER — Ambulatory Visit: Payer: Medicare Other

## 2023-11-25 NOTE — Telephone Encounter (Signed)
 Reviewed transmission with Dr. Waddell.  Per Dr. Waddell appears episode was true VT appropriately treated by device.  Outreach made to Pt.   Advised of above.  Pt advised per [place] DMV guidelines no driving x 6 months from 2/72/7974.  Pt indicates understanding.  Confirmed scheduled follow up with EP APP.

## 2023-12-06 ENCOUNTER — Encounter: Payer: Self-pay | Admitting: Student

## 2023-12-06 ENCOUNTER — Ambulatory Visit: Attending: Student | Admitting: Student

## 2023-12-06 VITALS — BP 155/90 | HR 82 | Ht 69.0 in | Wt 183.2 lb

## 2023-12-06 DIAGNOSIS — I4892 Unspecified atrial flutter: Secondary | ICD-10-CM | POA: Diagnosis not present

## 2023-12-06 DIAGNOSIS — I498 Other specified cardiac arrhythmias: Secondary | ICD-10-CM

## 2023-12-06 DIAGNOSIS — I2510 Atherosclerotic heart disease of native coronary artery without angina pectoris: Secondary | ICD-10-CM

## 2023-12-06 DIAGNOSIS — I255 Ischemic cardiomyopathy: Secondary | ICD-10-CM | POA: Diagnosis not present

## 2023-12-06 DIAGNOSIS — I471 Supraventricular tachycardia, unspecified: Secondary | ICD-10-CM

## 2023-12-06 LAB — CUP PACEART INCLINIC DEVICE CHECK
Battery Remaining Longevity: 31 mo
Brady Statistic RV Percent Paced: 0.84 %
Date Time Interrogation Session: 20250915124211
HighPow Impedance: 56.5279
Implantable Lead Connection Status: 753985
Implantable Lead Implant Date: 20080527
Implantable Lead Location: 753860
Implantable Lead Model: 7121
Implantable Pulse Generator Implant Date: 20171201
Lead Channel Impedance Value: 525 Ohm
Lead Channel Pacing Threshold Amplitude: 1 V
Lead Channel Pacing Threshold Amplitude: 1 V
Lead Channel Pacing Threshold Pulse Width: 0.5 ms
Lead Channel Pacing Threshold Pulse Width: 0.5 ms
Lead Channel Sensing Intrinsic Amplitude: 12 mV
Lead Channel Setting Pacing Amplitude: 2.5 V
Lead Channel Setting Pacing Pulse Width: 0.5 ms
Lead Channel Setting Sensing Sensitivity: 0.5 mV
Pulse Gen Serial Number: 7283394
Zone Setting Status: 755011

## 2023-12-06 MED ORDER — METOPROLOL TARTRATE 25 MG PO TABS: 25.0000 mg | ORAL_TABLET | Freq: Two times a day (BID) | ORAL | 3 refills | Status: AC

## 2023-12-06 NOTE — Patient Instructions (Addendum)
 Medication Instructions:  Increase metoprolol  tartrate (Lopressor ) to 25 mg twice a day *If you need a refill on your cardiac medications before your next appointment, please call your pharmacy*  Lab Work: BMET, CBC, MAG-TODAY If you have labs (blood work) drawn today and your tests are completely normal, you will receive your results only by: MyChart Message (if you have MyChart) OR A paper copy in the mail If you have any lab test that is abnormal or we need to change your treatment, we will call you to review the results.  Testing/Procedures: Your physician has recommended that you have a Cardioversion (DCCV). Electrical Cardioversion uses a jolt of electricity to your heart either through paddles or wired patches attached to your chest. This is a controlled, usually prescheduled, procedure. Defibrillation is done under light anesthesia in the hospital, and you usually go home the day of the procedure. This is done to get your heart back into a normal rhythm. You are not awake for the procedure. Please see the instruction sheet given to you today.     Dear Brad Brown  You are scheduled for a Cardioversion on Monday, September 22 with Dr. Kate.  Please arrive at the [facility] (Main Entrance A) at [facility]: [address] [place], [place] [address] at 10.30 (This time is 1.5 hour(s) before your procedure to ensure your preparation).   Free valet parking service is available. You will check in at ADMITTING.   *Please Note: You will receive a call the day before your procedure to confirm the appointment time. That time may have changed from the original time based on the schedule for that day.*   Nothing to eat or drink after midnight except a sip of water with medications (see medication instructions below)  MEDICATION INSTRUCTIONS:  HOLD: Semaglutide (Ozempic, Rybelsus, Wegovy) for 1 week prior to the procedure. Last dose on Saturday, December 04 2023         :1}HOLD: Empagliflozin (Jardiance) for 3  days prior to the procedure. Last dose on Monday, September 15.        :1}Continue taking your anticoagulant (blood thinner): Rivaroxaban  (Xarelto ).  You will need to continue this after your procedure until you are told by your provider that it is safe to stop.    LABS: Bmet, CBC, Mgt today  FYI:  For your safety, and to allow us  to monitor your vital signs accurately during the surgery/procedure we request: If you have artificial nails, gel coating, SNS etc, please have those removed prior to your surgery/procedure. Not having the nail coverings /polish removed may result in cancellation or delay of your surgery/procedure.  Your support person will be asked to wait in the waiting room during your procedure.  It is OK to have someone drop you off and come back when you are ready to be discharged.  You cannot drive after the procedure and will need someone to drive you home.  Bring your insurance cards.  *Special Note: Every effort is made to have your procedure done on time. Occasionally there are emergencies that occur at the hospital that may cause delays. Please be patient if a delay does occur.    Follow-Up: At [facility], you and your health needs are our priority.  As part of our continuing mission to provide you with exceptional heart care, our providers are all part of one team.  This team includes your primary Cardiologist (physician) and Advanced Practice Providers or APPs (Physician Assistants and  Nurse Practitioners) who all work together to provide you with the care you need, when you need it.  Your next appointment:   Next available  Provider:   Danelle Birmingham, MD    We recommend signing up for the patient portal called MyChart.  Sign up information is provided on this After Visit Summary.  MyChart is used to connect with patients for Virtual Visits (Telemedicine).  Patients are able to view lab/test results, encounter  notes, upcoming appointments, etc.  Non-urgent messages can be sent to your provider as well.   To learn more about what you can do with MyChart, go to [email or web address].

## 2023-12-06 NOTE — Progress Notes (Signed)
    Electrophysiology Office Note:   ID:  Brad Brown, Brad Brown 1945-05-22, MRN [id number]  Primary Cardiologist: None Electrophysiologist: Danelle Birmingham, MD      History of Present Illness:   Brad Brown is a 78 y.o. male with h/o Brugada syndrome s/p ICD insertion, CAD s/p MI, DM2, chronic GERD, HTN, h/o CVAs, and seizures seen today for routine electrophysiology followup.   Last seen in office 09/2022  Since last being seen in our clinic the patient reports doing OK. He remembers his episode from July of getting shock. Doesn't remember if he had a syncopal spell; but does remember flushing and lightheadedness. Otherwise, he denies chest pain, palpitations, dyspnea, PND, orthopnea, nausea, vomiting, dizziness, syncope, edema, weight gain, or early satiety.  Denies any missed medications, including Xarelto .   Review of systems complete and found to be negative unless listed in HPI.   EP Information / Studies Reviewed:    EKG is ordered today. Personal review as below.  EKG Interpretation Date/Time:  Monday December 06 2023 10:17:46 EDT Ventricular Rate:  82 PR Interval:    QRS Duration:  104 QT Interval:  362 QTC Calculation: 422 R Axis:   126  Text Interpretation: Atrial flutter with variable A-V block Left posterior fascicular block Inferior infarct , age undetermined When compared with ECG of 17-Oct-2018 23:17, PREVIOUS ECG IS PRESENT Confirmed by Lesia Sharper [phone]) on 12/06/2023 10:36:06 AM    ICD Interrogation-  reviewed in detail today,  See PACEART report.  Arrhythmia/Device History Abbott Single Chamber ICD implanted 2006, gen change 02/2016   Physical Exam:   VS:  BP (!) 155/90   Pulse 82   Ht 5' 9 (1.753 m)   Wt 183 lb 3.2 oz (83.1 kg)   SpO2 99%   BMI 27.05 kg/m    Wt Readings from Last 3 Encounters:  12/06/23 183 lb 3.2 oz (83.1 kg)  02/25/23 185 lb (83.9 kg)  01/14/23 188 lb 12.8 oz (85.6 kg)     GEN: No acute distress  NECK: No JVD; No  carotid bruits CARDIAC: Regular rate and rhythm, no murmurs, rubs, gallops RESPIRATORY:  Clear to auscultation without rales, wheezing or rhonchi  ABDOMEN: Soft, non-tender, non-distended EXTREMITIES:  No edema; No deformity   ASSESSMENT AND PLAN:    Brugada Syndrome s/p Abbott single chamber ICD  VT euvolemic today Stable on an appropriate medical regimen Normal ICD function See Pace Art report No changes today Reviewed NCDMV driving restrictions given therapy in July.   Atrial flutter Upright in both V1 and inferior leads, so may be atypical Increase lopressor  to 25 mg BID  ? If his shock in July was a tachy mediated tachy, as atrial flutter is a new diagnosis.   Consider updating Echo after [facility].   Continue Xarelto  for CHA2DS2/VASc of at least 7 His AAD options are very few given his Brugada and seizures.   H/o SVT Follow. Increasing lopressor  as above.   CAD No s/s of ischemia.      Disposition:   Follow up with Dr. Birmingham following Cardioversion.    Signed, Sharper Prentice Lesia, PA-C

## 2023-12-06 NOTE — H&P (View-Only) (Signed)
    Electrophysiology Office Note:   ID:  Whitten, Andreoni 1945-05-22, MRN [id number]  Primary Cardiologist: None Electrophysiologist: Danelle Birmingham, MD      History of Present Illness:   Brad Brown is a 78 y.o. male with h/o Brugada syndrome s/p ICD insertion, CAD s/p MI, DM2, chronic GERD, HTN, h/o CVAs, and seizures seen today for routine electrophysiology followup.   Last seen in office 09/2022  Since last being seen in our clinic the patient reports doing OK. He remembers his episode from July of getting shock. Doesn't remember if he had a syncopal spell; but does remember flushing and lightheadedness. Otherwise, he denies chest pain, palpitations, dyspnea, PND, orthopnea, nausea, vomiting, dizziness, syncope, edema, weight gain, or early satiety.  Denies any missed medications, including Xarelto .   Review of systems complete and found to be negative unless listed in HPI.   EP Information / Studies Reviewed:    EKG is ordered today. Personal review as below.  EKG Interpretation Date/Time:  Monday December 06 2023 10:17:46 EDT Ventricular Rate:  82 PR Interval:    QRS Duration:  104 QT Interval:  362 QTC Calculation: 422 R Axis:   126  Text Interpretation: Atrial flutter with variable A-V block Left posterior fascicular block Inferior infarct , age undetermined When compared with ECG of 17-Oct-2018 23:17, PREVIOUS ECG IS PRESENT Confirmed by Lesia Sharper [phone]) on 12/06/2023 10:36:06 AM    ICD Interrogation-  reviewed in detail today,  See PACEART report.  Arrhythmia/Device History Abbott Single Chamber ICD implanted 2006, gen change 02/2016   Physical Exam:   VS:  BP (!) 155/90   Pulse 82   Ht 5' 9 (1.753 m)   Wt 183 lb 3.2 oz (83.1 kg)   SpO2 99%   BMI 27.05 kg/m    Wt Readings from Last 3 Encounters:  12/06/23 183 lb 3.2 oz (83.1 kg)  02/25/23 185 lb (83.9 kg)  01/14/23 188 lb 12.8 oz (85.6 kg)     GEN: No acute distress  NECK: No JVD; No  carotid bruits CARDIAC: Regular rate and rhythm, no murmurs, rubs, gallops RESPIRATORY:  Clear to auscultation without rales, wheezing or rhonchi  ABDOMEN: Soft, non-tender, non-distended EXTREMITIES:  No edema; No deformity   ASSESSMENT AND PLAN:    Brugada Syndrome s/p Abbott single chamber ICD  VT euvolemic today Stable on an appropriate medical regimen Normal ICD function See Pace Art report No changes today Reviewed [employer] driving restrictions given therapy in July.   Atrial flutter Upright in both V1 and inferior leads, so may be atypical Increase lopressor  to 25 mg BID  ? If his shock in July was a tachy mediated tachy, as atrial flutter is a new diagnosis.   Consider updating Echo after [facility].   Continue Xarelto  for CHA2DS2/VASc of at least 7 His AAD options are very few given his Brugada and seizures.   H/o SVT Follow. Increasing lopressor  as above.   CAD No s/s of ischemia.      Disposition:   Follow up with Dr. Birmingham following Cardioversion.    Signed, Sharper Prentice Lesia, PA-C

## 2023-12-07 ENCOUNTER — Ambulatory Visit: Payer: Self-pay | Admitting: Student

## 2023-12-07 LAB — CBC
Hematocrit: 47.2 % (ref 37.5–51.0)
Hemoglobin: 15.2 g/dL (ref 13.0–17.7)
MCH: 30.3 pg (ref 26.6–33.0)
MCHC: 32.2 g/dL (ref 31.5–35.7)
MCV: 94 fL (ref 79–97)
Platelets: 209 x10E3/uL (ref 150–450)
RBC: 5.02 x10E6/uL (ref 4.14–5.80)
RDW: 12.8 % (ref 11.6–15.4)
WBC: 6.4 x10E3/uL (ref 3.4–10.8)

## 2023-12-07 LAB — BASIC METABOLIC PANEL WITH GFR
BUN/Creatinine Ratio: 11 (ref 10–24)
BUN: 17 mg/dL (ref 8–27)
CO2: 19 mmol/L — AB (ref 20–29)
Calcium: 9.7 mg/dL (ref 8.6–10.2)
Chloride: 101 mmol/L (ref 96–106)
Creatinine, Ser: 1.59 mg/dL — AB (ref 0.76–1.27)
Glucose: 56 mg/dL — AB (ref 70–99)
Potassium: 4.4 mmol/L (ref 3.5–5.2)
Sodium: 136 mmol/L (ref 134–144)
eGFR: 45 mL/min/1.73 — AB (ref >59)

## 2023-12-07 LAB — MAGNESIUM: Magnesium: 1.9 mg/dL (ref 1.6–2.3)

## 2023-12-10 ENCOUNTER — Other Ambulatory Visit (HOSPITAL_COMMUNITY): Payer: Self-pay

## 2023-12-10 NOTE — Progress Notes (Signed)
 Left VM to return call for instructions for appointment on Monday 12/13/23.

## 2023-12-13 ENCOUNTER — Encounter (HOSPITAL_COMMUNITY)
Admission: RE | Disposition: A | Discharge status: Home / Self Care | Payer: Self-pay | Source: Home / Self Care | Attending: Cardiology

## 2023-12-13 ENCOUNTER — Ambulatory Visit (HOSPITAL_BASED_OUTPATIENT_CLINIC_OR_DEPARTMENT_OTHER): Admitting: Anesthesiology

## 2023-12-13 ENCOUNTER — Other Ambulatory Visit: Payer: Self-pay

## 2023-12-13 ENCOUNTER — Ambulatory Visit (HOSPITAL_COMMUNITY): Admitting: Anesthesiology

## 2023-12-13 ENCOUNTER — Ambulatory Visit (HOSPITAL_COMMUNITY)
Admission: RE | Admit: 2023-12-13 | Discharge: 2023-12-13 | Disposition: A | Discharge status: Home / Self Care | Attending: Cardiology | Admitting: Cardiology

## 2023-12-13 DIAGNOSIS — Z87891 Personal history of nicotine dependence: Secondary | ICD-10-CM | POA: Insufficient documentation

## 2023-12-13 DIAGNOSIS — E119 Type 2 diabetes mellitus without complications: Secondary | ICD-10-CM | POA: Insufficient documentation

## 2023-12-13 DIAGNOSIS — R569 Unspecified convulsions: Secondary | ICD-10-CM | POA: Diagnosis not present

## 2023-12-13 DIAGNOSIS — I4892 Unspecified atrial flutter: Secondary | ICD-10-CM | POA: Diagnosis not present

## 2023-12-13 DIAGNOSIS — I10 Essential (primary) hypertension: Secondary | ICD-10-CM

## 2023-12-13 DIAGNOSIS — J449 Chronic obstructive pulmonary disease, unspecified: Secondary | ICD-10-CM | POA: Diagnosis not present

## 2023-12-13 DIAGNOSIS — I252 Old myocardial infarction: Secondary | ICD-10-CM | POA: Diagnosis not present

## 2023-12-13 DIAGNOSIS — Z7901 Long term (current) use of anticoagulants: Secondary | ICD-10-CM | POA: Diagnosis not present

## 2023-12-13 DIAGNOSIS — I2510 Atherosclerotic heart disease of native coronary artery without angina pectoris: Secondary | ICD-10-CM | POA: Diagnosis not present

## 2023-12-13 DIAGNOSIS — I498 Other specified cardiac arrhythmias: Secondary | ICD-10-CM | POA: Insufficient documentation

## 2023-12-13 HISTORY — PX: CARDIOVERSION: EP1203

## 2023-12-13 SURGERY — CARDIOVERSION (CATH LAB)
Anesthesia: General

## 2023-12-13 MED ORDER — LIDOCAINE 2% (20 MG/ML) 5 ML SYRINGE: INTRAMUSCULAR | Status: DC | PRN

## 2023-12-13 MED ORDER — FENTANYL CITRATE (PF) 100 MCG/2ML IJ SOLN: INTRAMUSCULAR | Status: DC | PRN

## 2023-12-13 MED ORDER — PROPOFOL 10 MG/ML IV BOLUS: INTRAVENOUS | Status: DC | PRN

## 2023-12-13 MED ORDER — PROPOFOL 10 MG/ML IV BOLUS
INTRAVENOUS | Status: DC | PRN
  Administered 2023-12-13: 70 mg via INTRAVENOUS

## 2023-12-13 MED ORDER — PHENYLEPHRINE 80 MCG/ML (10ML) SYRINGE FOR IV PUSH (FOR BLOOD PRESSURE SUPPORT): PREFILLED_SYRINGE | INTRAVENOUS | Status: DC | PRN

## 2023-12-13 MED ORDER — SODIUM CHLORIDE 0.9 % IV SOLN: INTRAVENOUS | Status: DC

## 2023-12-13 MED ORDER — LIDOCAINE 2% (20 MG/ML) 5 ML SYRINGE
INTRAMUSCULAR | Status: DC | PRN
  Administered 2023-12-13: 60 mg via INTRAVENOUS

## 2023-12-13 SURGICAL SUPPLY — 1 items: PAD DEFIB RADIO PHYSIO CONN (PAD) ×1 IMPLANT

## 2023-12-13 NOTE — Transfer of Care (Signed)
 Immediate Anesthesia Transfer of Care Note  Patient: Brad Brown  Procedure(s) Performed: CARDIOVERSION  Patient Location: PACU and Cath Lab  Anesthesia Type:MAC  Level of Consciousness: awake and drowsy  Airway & Oxygen Therapy: Patient Spontanous Breathing and Patient connected to nasal cannula oxygen  Post-op Assessment: Report given to RN and Post -op Vital signs reviewed and stable  Post vital signs: Reviewed, STABLE  Last Vitals:  Vitals Value Taken Time  BP 110/93   Temp    Pulse 121   Resp 22   SpO2 99     Last Pain:  Vitals:   12/13/23 1046  TempSrc:   PainSc: 0-No pain         Complications: No notable events documented.

## 2023-12-13 NOTE — Interval H&P Note (Signed)
 History and Physical Interval Note:  12/13/2023 1:33 PM  Brad Brown  has presented today for surgery, with the diagnosis of A FLUTTER.  The various methods of treatment have been discussed with the patient and family. After consideration of risks, benefits and other options for treatment, the patient has consented to  Procedure(s): CARDIOVERSION (N/A) as a surgical intervention.  The patient's history has been reviewed, patient examined, no change in status, stable for surgery.  I have reviewed the patient's chart and labs.  Questions were answered to the patient's satisfaction.     Brad Brown

## 2023-12-13 NOTE — Anesthesia Preprocedure Evaluation (Addendum)
 Anesthesia Evaluation  Patient identified by MRN, date of birth, ID band Patient awake    Reviewed: Allergy & Precautions, H&P , NPO status , Patient's Chart, lab work & pertinent test results  Airway Mallampati: II   Neck ROM: full    Dental  (+) Dental Advisory Given   Pulmonary COPD, former smoker   breath sounds clear to auscultation       Cardiovascular hypertension, + CAD and + Past MI  + dysrhythmias Atrial Fibrillation + Cardiac Defibrillator  Rhythm:Irregular Rate:Normal  Stress test (2008): no ischemia, EF 59%   Neuro/Psych Seizures -,  CVA    GI/Hepatic ,GERD  ,,  Endo/Other  diabetes, Type 2    Renal/GU Renal InsufficiencyRenal disease     Musculoskeletal  (+) Arthritis ,    Abdominal   Peds  Hematology   Anesthesia Other Findings   Reproductive/Obstetrics                              Anesthesia Physical Anesthesia Plan  ASA: IV  Anesthesia Plan: General   Post-op Pain Management: Minimal or no pain anticipated   Induction: Intravenous  PONV Risk Score and Plan: 2 and Treatment may vary due to age or medical condition and Propofol  infusion  Airway Management Planned: Nasal Cannula  Additional Equipment:   Intra-op Plan:   Post-operative Plan:   Informed Consent: I have reviewed the patients History and Physical, chart, labs and discussed the procedure including the risks, benefits and alternatives for the proposed anesthesia with the patient or authorized representative who has indicated his/her understanding and acceptance.       Plan Discussed with: CRNA, Anesthesiologist and Surgeon  Anesthesia Plan Comments:          Anesthesia Quick Evaluation

## 2023-12-13 NOTE — CV Procedure (Addendum)
 Procedure:   DCCV  Indication:  Symptomatic atrial flutter  Procedure Note:  The patient signed informed consent.  They have had had therapeutic anticoagulation with Xarelto  greater than 3 weeks.  Anesthesia was administered by Dr. Cleotilde and Jackee Peng, CRNA.  Adequate airway was maintained throughout and vital followed per protocol.  They were cardioverted x 1 with 100J of biphasic synchronized energy.  They converted to NSR with rate in 70s.  There were no apparent complications.  The patient had normal neuro status and respiratory status post procedure with vitals stable as recorded elsewhere.    Follow up:  They will continue on current medical therapy and follow up with cardiology as scheduled.  Lonni Nanas, MD 12/13/2023 1:47 PM

## 2023-12-14 ENCOUNTER — Encounter (HOSPITAL_COMMUNITY): Payer: Self-pay | Admitting: Cardiology

## 2023-12-14 NOTE — Anesthesia Postprocedure Evaluation (Deleted)
 Anesthesia Post Note  Patient: Brad Brown  Procedure(s) Performed: CARDIOVERSION     Patient location during evaluation: PACU Anesthesia Type: General Level of consciousness: awake and alert Pain management: pain level controlled Vital Signs Assessment: post-procedure vital signs reviewed and stable Respiratory status: spontaneous breathing, nonlabored ventilation and respiratory function stable Cardiovascular status: blood pressure returned to baseline and stable Postop Assessment: no apparent nausea or vomiting Anesthetic complications: no   No notable events documented.  Last Vitals:  Vitals:   12/13/23 1350 12/13/23 1420  BP: (!) 140/97 (!) 138/94  Pulse: 84 82  Resp: 15 16  Temp:    SpO2: 97% 96%    Last Pain:  Vitals:   12/13/23 1420  TempSrc:   PainSc: 0-No pain                 Butler Levander Pinal

## 2023-12-14 NOTE — Anesthesia Postprocedure Evaluation (Signed)
 Anesthesia Post Note  Patient: Brad Brown  Procedure(s) Performed: CARDIOVERSION     Anesthesia Type: General Anesthetic complications: no   No notable events documented.  Last Vitals:  Vitals:   12/13/23 1350 12/13/23 1420  BP: (!) 140/97 (!) 138/94  Pulse: 84 82  Resp: 15 16  Temp:    SpO2: 97% 96%    Last Pain:  Vitals:   12/13/23 1420  TempSrc:   PainSc: 0-No pain                 Butler Levander Pinal

## 2023-12-20 ENCOUNTER — Other Ambulatory Visit: Payer: Self-pay | Admitting: Nurse Practitioner

## 2023-12-20 DIAGNOSIS — J411 Mucopurulent chronic bronchitis: Secondary | ICD-10-CM

## 2023-12-22 ENCOUNTER — Other Ambulatory Visit: Payer: Self-pay | Admitting: Nurse Practitioner

## 2023-12-22 DIAGNOSIS — E1122 Type 2 diabetes mellitus with diabetic chronic kidney disease: Secondary | ICD-10-CM

## 2023-12-23 NOTE — Telephone Encounter (Signed)
 Needs a cpe within the next 90 days to continue getting refills   Brad Brown pool: I have the patient on metformin  and they have a empagliflozin-metformin  combo. I know that he is seen through the [place] we need to verify what he is taking

## 2023-12-24 ENCOUNTER — Other Ambulatory Visit: Payer: Self-pay | Admitting: Nurse Practitioner

## 2023-12-24 DIAGNOSIS — J411 Mucopurulent chronic bronchitis: Secondary | ICD-10-CM

## 2023-12-24 NOTE — Telephone Encounter (Signed)
 Copied from CRM #[id number]. Topic: Clinical - Medication Refill >> Dec 24, 2023 10:37 AM Tanazia G wrote: Medication:  SYMBICORT  160-4.5 MCG/ACT inhaler   Has the patient contacted their pharmacy? Yes (Agent: If no, request that the patient contact the pharmacy for the refill. If patient does not wish to contact the pharmacy document the reason why and proceed with request.) (Agent: If yes, when and what did the pharmacy advise?)  This is the patient's preferred pharmacy:  [id number] [place], [place] - [address] [place] [address] Phone: [phone] Fax: [phone]   Is this the correct pharmacy for this prescription? Yes If no, delete pharmacy and type the correct one.   Has the prescription been filled recently? Yes  Is the patient out of the medication? Yes  Has the patient been seen for an appointment in the last year OR does the patient have an upcoming appointment? Yes  Can we respond through MyChart? Yes  Agent: Please be advised that Rx refills may take up to 3 business days. We ask that you follow-up with your pharmacy.

## 2023-12-24 NOTE — Telephone Encounter (Signed)
 Called patient, he attempted to look for his bottle of medication to verify what he was taking but was unable to find it. He will look for this more and call back once he finds it.

## 2023-12-24 NOTE — Telephone Encounter (Signed)
 Copied from CRM #[id number]. Topic: General - Other >> Dec 24, 2023 11:53 AM [place] J wrote: Reason for CRM: Patient is returning a call from [place]. He wanted to let her know that he is taking empagliflozin-metformin  12.07-998 MG tablets.  Patient would like a call with next steps and to confirm that information was relayed to Marshal Eskew. He is aware of the same day turn around time and will be expecting a call back.

## 2023-12-27 ENCOUNTER — Ambulatory Visit

## 2023-12-27 DIAGNOSIS — I4892 Unspecified atrial flutter: Secondary | ICD-10-CM

## 2023-12-28 ENCOUNTER — Other Ambulatory Visit: Payer: Self-pay | Admitting: Nurse Practitioner

## 2023-12-28 ENCOUNTER — Telehealth: Payer: Self-pay

## 2023-12-28 DIAGNOSIS — E1122 Type 2 diabetes mellitus with diabetic chronic kidney disease: Secondary | ICD-10-CM

## 2023-12-28 NOTE — Telephone Encounter (Signed)
 Alert received from [employer] for presenting EGM with intermittent irregular R-R intervals, cannot exclude atrial arrhythmia, s/p cardioversion on 12/13/23, on Xarelto .   Was seen in office by [place], [place] 12/06/23. Had DCCV 12/13/23. Return of AF/AFL. Has f/u w/ Dr. Waddell 12/3023.   Routing to [place], [place] to advise further.

## 2023-12-29 LAB — CUP PACEART REMOTE DEVICE CHECK
Battery Remaining Longevity: 31 mo
Battery Remaining Percentage: 30 %
Battery Voltage: 2.83 V
Brady Statistic RV Percent Paced: 1 %
Date Time Interrogation Session: 20251007120431
HighPow Impedance: 55 Ohm
HighPow Impedance: 55 Ohm
Implantable Lead Connection Status: 753985
Implantable Lead Implant Date: 20080527
Implantable Lead Location: 753860
Implantable Lead Model: 7121
Implantable Pulse Generator Implant Date: 20171201
Lead Channel Impedance Value: 510 Ohm
Lead Channel Pacing Threshold Amplitude: 1 V
Lead Channel Pacing Threshold Pulse Width: 0.5 ms
Lead Channel Sensing Intrinsic Amplitude: 12 mV
Lead Channel Setting Pacing Amplitude: 2.5 V
Lead Channel Setting Pacing Pulse Width: 0.5 ms
Lead Channel Setting Sensing Sensitivity: 0.5 mV
Pulse Gen Serial Number: 7283394
Zone Setting Status: 755011

## 2023-12-29 NOTE — Progress Notes (Signed)
 Remote ICD Transmission

## 2023-12-29 NOTE — Telephone Encounter (Signed)
 Lesia Ozell Barter, PA-C to Prentiss Almarie DASEN, RN     12/28/23  8:53 PM No change if feeling OK.  Will need to see Dr. Waddell for which AADs we may be able to consider given his Brugada

## 2023-12-30 ENCOUNTER — Ambulatory Visit: Payer: Self-pay | Admitting: Internal Medicine

## 2023-12-30 NOTE — Telephone Encounter (Signed)
 Attempted to contact patient to assess symptoms. No answer, left message to call back.

## 2023-12-31 NOTE — Telephone Encounter (Signed)
 Called patient to discuss symptoms. Patient was not aware of being back into AF. Patient aware of 01/20/24 apt. Advised if fatigue, shortness of breath or palpations to call back. Pt voiced understanding and appreciative of call back.

## 2024-01-03 ENCOUNTER — Ambulatory Visit: Admitting: Nurse Practitioner

## 2024-01-03 NOTE — Progress Notes (Signed)
 Remote ICD Transmission

## 2024-01-19 ENCOUNTER — Other Ambulatory Visit: Payer: Self-pay | Admitting: Nurse Practitioner

## 2024-01-19 DIAGNOSIS — J411 Mucopurulent chronic bronchitis: Secondary | ICD-10-CM

## 2024-01-19 MED ORDER — ALBUTEROL SULFATE HFA 108 (90 BASE) MCG/ACT IN AERS: 2.0000 | INHALATION_SPRAY | Freq: Four times a day (QID) | RESPIRATORY_TRACT | 0 refills | Status: DC | PRN

## 2024-01-19 NOTE — Telephone Encounter (Signed)
 Copied from CRM [id number]. Topic: Clinical - Medication Refill >> Jan 19, 2024 12:39 PM Brittany M wrote: Medication: albuterol  (VENTOLIN  HFA) 108 (90 Base) MCG/ACT inhaler  Has the patient contacted their pharmacy? Yes (Agent: If no, request that the patient contact the pharmacy for the refill. If patient does not wish to contact the pharmacy document the reason why and proceed with request.) (Agent: If yes, when and what did the pharmacy advise?)  This is the patient's preferred pharmacy:  [id number] [place], [place] - [address] [place] [address] Phone: [phone] Fax: [phone]   Is this the correct pharmacy for this prescription? Yes If no, delete pharmacy and type the correct one.   Has the prescription been filled recently? Yes  Is the patient out of the medication? Yes  Has the patient been seen for an appointment in the last year OR does the patient have an upcoming appointment? Yes  Can we respond through MyChart? Yes  Agent: Please be advised that Rx refills may take up to 3 business days. We ask that you follow-up with your pharmacy.

## 2024-01-20 ENCOUNTER — Ambulatory Visit: Attending: Internal Medicine | Admitting: Internal Medicine

## 2024-01-21 ENCOUNTER — Encounter: Payer: Self-pay | Admitting: Internal Medicine

## 2024-01-21 ENCOUNTER — Ambulatory Visit (INDEPENDENT_AMBULATORY_CARE_PROVIDER_SITE_OTHER): Admitting: Nurse Practitioner

## 2024-01-21 VITALS — BP 134/82 | HR 105 | Temp 97.5°F | Ht 69.0 in | Wt 189.4 lb

## 2024-01-21 DIAGNOSIS — N181 Chronic kidney disease, stage 1: Secondary | ICD-10-CM | POA: Diagnosis not present

## 2024-01-21 DIAGNOSIS — Z7984 Long term (current) use of oral hypoglycemic drugs: Secondary | ICD-10-CM

## 2024-01-21 DIAGNOSIS — E1122 Type 2 diabetes mellitus with diabetic chronic kidney disease: Secondary | ICD-10-CM | POA: Diagnosis not present

## 2024-01-21 DIAGNOSIS — I10 Essential (primary) hypertension: Secondary | ICD-10-CM | POA: Diagnosis not present

## 2024-01-21 LAB — POCT GLYCOSYLATED HEMOGLOBIN (HGB A1C): Hemoglobin A1C: 6.9 % — AB (ref 4.0–5.6)

## 2024-01-21 NOTE — Patient Instructions (Signed)
 A1C was 6.9% today I will be in touch with the labs once I have them Follow up with me in 6 months, sooner if you need me

## 2024-01-21 NOTE — Progress Notes (Signed)
 Established Patient Office Visit  Subjective   Patient ID: Brad Brown, male    DOB: February 26, 1946  Age: 78 y.o. MRN: [id number]  Chief Complaint  Patient presents with   Medication Management    Pt wants to know if he still needs to take Baclofen , Symbicort , Jardiance, Empaglifozin-Metformin , Hydrocodone , and Xarelto      HPI  Discussed the use of AI scribe software for clinical note transcription with the patient, who gave verbal consent to proceed.  History of Present Illness Brad Brown is a 78 year old male with atrial fibrillation and diabetes who presents for medication review and management.  He is concerned about the necessity of continuing his current medications, which include baclofen , Symbicort , Jardiance, a combination pill of empagliflozin and metformin , hydrocodone , Xarelto , metoprolol , levetiracetam , and glipizide .  He has a history of atrial fibrillation and recently underwent an unsuccessful cardioversion. He is currently taking Xarelto  and metoprolol . No chest pain, new shortness of breath, dizziness, or lightheadedness.  He manages his diabetes with glipizide  and a combination pill of empagliflozin and metformin . He is unsure of his last A1c check and does not regularly monitor his blood sugar at home.  He uses an albuterol  inhaler once a day as needed for shortness of breath and reports no extra wheezing. He does not have the Symbicort  inhaler with him today.  He experienced a fall approximately three months ago without loss of consciousness but felt wobbly. He did not have any seizures and has not had a seizure in about a year and a half. He continues to see a neurologist. Regular bowel movements and no recent seizures.     Review of Systems  Constitutional:  Negative for chills and fever.  Respiratory:  Negative for shortness of breath.   Cardiovascular:  Negative for chest pain.  Gastrointestinal:  Negative for abdominal pain and constipation.        BM daily  Neurological:  Negative for dizziness and headaches.  Psychiatric/Behavioral:  Negative for hallucinations and suicidal ideas.       Objective:     BP 134/82   Pulse (!) 105   Temp (!) 97.5 F (36.4 C) (Oral)   Ht 5' 9 (1.753 m)   Wt 189 lb 6.4 oz (85.9 kg)   SpO2 97%   BMI 27.97 kg/m  BP Readings from Last 3 Encounters:  01/21/24 134/82  12/13/23 (!) 138/94  12/06/23 (!) 155/90   Wt Readings from Last 3 Encounters:  01/21/24 189 lb 6.4 oz (85.9 kg)  12/13/23 183 lb 8 oz (83.2 kg)  12/06/23 183 lb 3.2 oz (83.1 kg)   SpO2 Readings from Last 3 Encounters:  01/21/24 97%  12/13/23 96%  12/06/23 99%      Physical Exam Vitals and nursing note reviewed.  Constitutional:      Appearance: Normal appearance.  Neck:     Vascular: No carotid bruit.  Cardiovascular:     Rate and Rhythm: Normal rate and regular rhythm.     Heart sounds: Normal heart sounds.  Pulmonary:     Effort: Pulmonary effort is normal.     Breath sounds: Normal breath sounds.  Neurological:     Mental Status: He is alert.      Results for orders placed or performed in visit on 01/21/24  POCT glycosylated hemoglobin (Hb A1C)  Result Value Ref Range   Hemoglobin A1C 6.9 (A) 4.0 - 5.6 %   HbA1c POC (<> result, manual entry)  HbA1c, POC (prediabetic range)     HbA1c, POC (controlled diabetic range)        The ASCVD Risk score (Arnett DK, et al., 2019) failed to calculate for the following reasons:   Risk score cannot be calculated because patient has a medical history suggesting prior/existing ASCVD    Assessment & Plan:   Problem List Items Addressed This Visit       Endocrine   Type 2 diabetes mellitus with stage 1 chronic kidney disease, without long-term current use of insulin  (HCC) - Primary   Relevant Orders   POCT glycosylated hemoglobin (Hb A1C) (Completed)   CBC   COMPLETE METABOLIC PANEL WITHOUT GFR   Assessment and Plan Assessment & Plan Atrial  fibrillation and atrial flutter Persistent arrhythmia post-cardioversion requires anticoagulation to prevent thromboembolic events. - Continue Xarelto  (rivaroxaban ) for anticoagulation. - Continue metoprolol  for heart rate control.  Type 2 diabetes mellitus A1c at 6.9 indicates good control. - Continue Synjardy (empagliflozin and metformin ) and glipizide . - Monitor blood glucose levels and consider continuous glucose monitoring through the [place].  Chronic obstructive pulmonary disease (COPD) Managed with Symbicort  and albuterol . Advised to reduce albuterol  use to less than once a week. - Use albuterol  inhaler as needed for wheezing or shortness of breath. - Monitor frequency of albuterol  use; if increased, consider daily maintenance inhaler.  Seizure disorder Managed with levetiracetam . Recent fall could be related to seizure, hypoglycemia, or arrhythmia. - Continue levetiracetam  as prescribed. - Seek immediate medical attention if another fall occurs, especially if head injury is suspected.  Hypertension Blood pressure control essential due to cardiac history. - Continue metoprolol  for blood pressure management.  General Health Maintenance Received flu shot. Discussed importance of monitoring for head injuries due to anticoagulation therapy. - [facility]. - Seek immediate medical attention if head injury occurs due to anticoagulation therapy.  Return in about 6 months (around 07/20/2024) for DM recheck.    Adina Crandall, NP

## 2024-01-22 LAB — CBC
HCT: 48.5 % (ref 38.5–50.0)
Hemoglobin: 16.3 g/dL (ref 13.2–17.1)
MCH: 30.7 pg (ref 27.0–33.0)
MCHC: 33.6 g/dL (ref 32.0–36.0)
MCV: 91.3 fL (ref 80.0–100.0)
MPV: 11.6 fL (ref 7.5–12.5)
Platelets: 217 Thousand/uL (ref 140–400)
RBC: 5.31 Million/uL (ref 4.20–5.80)
RDW: 12.9 % (ref 11.0–15.0)
WBC: 6.4 Thousand/uL (ref 3.8–10.8)

## 2024-01-22 LAB — COMPLETE METABOLIC PANEL WITHOUT GFR
AG Ratio: 1.7 (calc) (ref 1.0–2.5)
ALT: 20 U/L (ref 9–46)
AST: 16 U/L (ref 10–35)
Albumin: 4.8 g/dL (ref 3.6–5.1)
Alkaline phosphatase (APISO): 52 U/L (ref 35–144)
BUN/Creatinine Ratio: 14 (calc) (ref 6–22)
BUN: 21 mg/dL (ref 7–25)
CO2: 26 mmol/L (ref 20–32)
Calcium: 10 mg/dL (ref 8.6–10.3)
Chloride: 103 mmol/L (ref 98–110)
Creat: 1.46 mg/dL — ABNORMAL HIGH (ref 0.70–1.28)
Globulin: 2.9 g/dL (ref 1.9–3.7)
Glucose, Bld: 90 mg/dL (ref 65–99)
Potassium: 4.4 mmol/L (ref 3.5–5.3)
Sodium: 138 mmol/L (ref 135–146)
Total Bilirubin: 0.7 mg/dL (ref 0.2–1.2)
Total Protein: 7.7 g/dL (ref 6.1–8.1)

## 2024-01-25 ENCOUNTER — Ambulatory Visit: Payer: Self-pay | Admitting: Nurse Practitioner

## 2024-01-25 NOTE — Telephone Encounter (Signed)
 Copied from CRM [id number]. Topic: Clinical - Lab/Test Results >> Jan 25, 2024  4:30 PM Taleah C wrote: Reason for CRM: pt called back. I relayed results verbally.

## 2024-02-03 ENCOUNTER — Telehealth: Payer: Self-pay

## 2024-02-03 NOTE — Telephone Encounter (Addendum)
 Patient called back into clinic in response to VM left  This RN discussed alert transmission and recorded events   Patient denies feeling symptomatic during time of events and ATP treatments  Patient stated, I feel great right now.  Patient has hx of AF/AFL and is on a [facility]  Patient confirmed compliancy with prescribed meds Lopressor  and Xarelto    Patient has follow up with [facility] on 03/20/24 that will keep unless patient becomes symptomatic and then can reassess if need to move appointment date up  6 month driving restrictions renewed until 08/02/2024 and patient verbalized understanding  Patient instructed to call clinic at [phone] if he begins experiencing any symptoms such as lightheadedness, dizziness, or palpitations and patient verbalized understanding   All questions and concerns addressed during call  Patient very appreciative of call

## 2024-02-03 NOTE — Telephone Encounter (Signed)
 Alert received:  Alert remote transmission:  VT/VF episode occurred, successful ATP delivered 2 VT-2 classified events 11/10 @ 21:18 & 21:19, HR's 203, 14-32sec in duration, ATP delivered 1-2 bursts slowing rate below detection, EGM's c/w regular tachy rhythm  similar morphology post therapy. AF with RVR vs VT, interpretation limited by single lead   Appears to be Afib with RVR.  Attempted to contact Pt.  Left detailed message requesting call back.

## 2024-02-23 ENCOUNTER — Ambulatory Visit: Payer: Medicare Other

## 2024-02-28 ENCOUNTER — Ambulatory Visit: Payer: Medicare Other

## 2024-02-28 VITALS — BP 134/82 | Ht 69.0 in | Wt 184.5 lb

## 2024-02-28 DIAGNOSIS — Z0000 Encounter for general adult medical examination without abnormal findings: Secondary | ICD-10-CM

## 2024-02-28 NOTE — Patient Instructions (Signed)
 Brad Brown,  Thank you for taking the time for your Medicare Wellness Visit. I appreciate your continued commitment to your health goals. Please review the care plan we discussed, and feel free to reach out if I can assist you further.  Please note that Annual Wellness Visits do not include a physical exam. Some assessments may be limited, especially if the visit was conducted virtually. If needed, we may recommend an in-person follow-up with your provider.  Ongoing Care Seeing your primary care provider every 3 to 6 months helps us  monitor your health and provide consistent, personalized care.   Referrals If a referral was made during today's visit and you haven't received any updates within two weeks, please contact the referred provider directly to check on the status.  Recommended Screenings:  Health Maintenance  Topic Date Due   Yearly kidney health urinalysis for diabetes  Never done   Complete foot exam   01/30/2023   Medicare Annual Wellness Visit  02/25/2024   COVID-19 Vaccine (8 - Pfizer risk 2025-26 season) 06/06/2024   Hemoglobin A1C  07/20/2024   Eye exam for diabetics  11/15/2024   Yearly kidney function blood test for diabetes  01/20/2025   DTaP/Tdap/Td vaccine (4 - Td or Tdap) 12/23/2032   Pneumococcal Vaccine for age over 36  Completed   Flu Shot  Completed   Hepatitis C Screening  Completed   Zoster (Shingles) Vaccine  Completed   Meningitis B Vaccine  Aged Out   Colon Cancer Screening  Discontinued       12/13/2023   10:47 AM  Advanced Directives  Does Patient Have a Medical Advance Directive? Yes  Type of Estate Agent of [place];Living will    Vision: Annual vision screenings are recommended for early detection of glaucoma, cataracts, and diabetic retinopathy. These exams can also reveal signs of chronic conditions such as diabetes and high blood pressure.  Dental: Annual dental screenings help detect early signs of oral cancer,  gum disease, and other conditions linked to overall health, including heart disease and diabetes.  Please see the attached documents for additional preventive care recommendations.

## 2024-02-28 NOTE — Progress Notes (Signed)
 I connected with  Brad Brown on 02/28/24 by a audio enabled telemedicine application and verified that I am speaking with the correct person using two identifiers.  Patient Location: Home  Provider Location: Home Office  Persons Participating in Visit: Patient.  I discussed the limitations of evaluation and management by telemedicine. The patient expressed understanding and agreed to proceed.  Vital Signs: Because this visit was a virtual/telehealth visit, some criteria may be missing or patient reported. Any vitals not documented were not able to be obtained and vitals that have been documented are patient reported.  Because this visit was a virtual/telehealth visit,  certain criteria was not obtained, such a blood pressure, CBG if applicable, and timed get up and go. Any medications not marked as taking were not mentioned during the medication reconciliation part of the visit. Any vitals not documented were not able to be obtained due to this being a telehealth visit or patient was unable to self-report a recent blood pressure reading due to a lack of equipment at home via telehealth. Vitals that have been documented are verbally provided by the patient.   This visit was performed by a medical professional under my direct supervision. I was immediately available for consultation/collaboration. I have reviewed and agree with the Annual Wellness Visit documentation.  Chief Complaint  Patient presents with   Medicare Wellness     Subjective:   SHELL YANDOW is a 78 y.o. male who presents for a Medicare Annual Wellness Visit.  Visit info / Clinical Intake: Medicare Wellness Visit Type:: Subsequent Annual Wellness Visit Persons participating in visit and providing information:: patient Medicare Wellness Visit Mode:: Telephone If telephone:: video declined Since this visit was completed virtually, some vitals may be partially provided or unavailable. Missing vitals are due to  the limitations of the virtual format.: Documented vitals are patient reported If Telephone or Video please confirm:: I connected with patient using audio/video enable telemedicine. I verified patient identity with two identifiers, discussed telehealth limitations, and patient agreed to proceed. Patient Location:: home Provider Location:: home office Interpreter Needed?: No Pre-visit prep was completed: yes AWV questionnaire completed by patient prior to visit?: no Living arrangements:: (!) lives alone Patient's Overall Health Status Rating: very good Typical amount of pain: none Does pain affect daily life?: no Are you currently prescribed opioids?: no  Dietary Habits and Nutritional Risks How many meals a day?: 3 Eats fruit and vegetables daily?: yes Most meals are obtained by: eating out; preparing own meals In the last 2 weeks, have you had any of the following?: none Diabetic:: (!) yes Any non-healing wounds?: no How often do you check your BS?: 2 Would you like to be referred to a Nutritionist or for Diabetic Management? : no  Functional Status Activities of Daily Living (to include ambulation/medication): Independent Ambulation: Independent Medication Administration: Independent Home Management (perform basic housework or laundry): Independent Manage your own finances?: yes Primary transportation is: driving Concerns about vision?: (!) yes (wears glasses) Concerns about hearing?: no  Fall Screening Falls in the past year?: 0 Number of falls in past year: 0 Was there an injury with Fall?: 0 Fall Risk Category Calculator: 0 Patient Fall Risk Level: Low Fall Risk  Fall Risk Patient at Risk for Falls Due to: No Fall Risks Fall risk Follow up: Falls evaluation completed  Home and Transportation Safety: All rugs have non-skid backing?: yes All stairs or steps have railings?: yes Grab bars in the bathtub or shower?: yes Have non-skid surface  in bathtub or shower?:  yes Good home lighting?: yes Regular seat belt use?: yes Hospital stays in the last year:: no  Cognitive Assessment Difficulty concentrating, remembering, or making decisions? : no Will 6CIT or Mini Cog be Completed: yes What year is it?: 0 points What month is it?: 0 points About what time is it?: 0 points Count backwards from 20 to 1: 0 points Say the months of the year in reverse: 0 points Repeat the address phrase from earlier: 0 points 6 CIT Score: 0 points  Advance Directives (For Healthcare) Does Patient Have a Medical Advance Directive?: Yes Does patient want to make changes to medical advance directive?: No - Patient declined Type of Advance Directive: Living will Copy of Living Will in Chart?: No - copy requested  Reviewed/Updated  Reviewed/Updated: Reviewed All (Medical, Surgical, Family, Medications, Allergies, Care Teams, Patient Goals)    Allergies (verified) Ace inhibitors and Lipitor [atorvastatin]   Current Medications (verified) Outpatient Encounter Medications as of 02/28/2024  Medication Sig   acetaminophen  (TYLENOL ) 325 MG tablet Take 650 mg by mouth 4 (four) times daily as needed for moderate pain (pain score 4-6).   albuterol  (VENTOLIN  HFA) 108 (90 Base) MCG/ACT inhaler Inhale 2 puffs into the lungs every 6 (six) hours as needed for wheezing or shortness of breath.   blood glucose meter kit and supplies Check sugar twice daily. DX E11.22   Cholecalciferol 50 MCG (2000 UT) TABS Take 2,000 Units by mouth daily.   ciclopirox  (LOPROX ) 0.77 % cream Apply 1 application topically 2 (two) times daily as needed (for rash).    Continuous Blood Gluc Receiver (FREESTYLE LIBRE 14 DAY READER) DEVI 1 each by Does not apply route as directed.   Empagliflozin-metFORMIN  HCl ER 12.07-998 MG TB24 Take by mouth.   Ginkgo Biloba 60 MG TABS Take by mouth.   glipiZIDE  (GLUCOTROL ) 10 MG tablet TAKE 1 TABLET (10 MG TOTAL) BY MOUTH IN THE MORNING AND AT BEDTIME    HYDROcodone -acetaminophen  (NORCO) 5-325 MG tablet Take 1-2 tablets by mouth every 6 (six) hours as needed for severe pain (Take tylenol  for mild and moderate pain).   levETIRAcetam  (KEPPRA ) 500 MG tablet 1 in the morning and 2 in the afternoon   losartan  (COZAAR ) 100 MG tablet Take 50 mg by mouth daily.   metoprolol  tartrate (LOPRESSOR ) 25 MG tablet Take 1 tablet (25 mg total) by mouth 2 (two) times daily.   Multiple Vitamin (MULTIVITAMIN) tablet Take 1 tablet by mouth daily.   nystatin  (MYCOSTATIN /NYSTOP ) powder Apply 1 Application topically 3 (three) times daily.   nystatin -triamcinolone (MYCOLOG II) cream APPLY SMALL AMOUNT TO AFFECTED AREA TWICE A DAY   Omega-3 Fatty Acids (FISH OIL) 1200 MG CAPS Take 1,200 mg by mouth daily.    rivaroxaban  (XARELTO ) 20 MG TABS tablet Take 20 mg by mouth daily with supper.   senna (SENOKOT) 8.6 MG TABS tablet Take 1 tablet (8.6 mg total) by mouth 2 (two) times daily.   simvastatin  (ZOCOR ) 20 MG tablet Take 10 mg by mouth at bedtime.    No facility-administered encounter medications on file as of 02/28/2024.    History: Past Medical History:  Diagnosis Date   Acute myocardial infarction [facility]) 06/1983   Arthritis    Atrial fibrillation (HCC)    Automatic implantable cardiac defibrillator in situ    Borderline diabetes mellitus    BPH without obstruction/lower urinary tract symptoms    CAD (coronary artery disease)    Chronic kidney disease, stage 1  Colonic polyp 2004   hyperplastic    COPD (chronic obstructive pulmonary disease) (HCC)    Diabetes mellitus without complication (HCC)    Fall    GERD (gastroesophageal reflux disease)    History of renal insufficiency syndrome    HTN (hypertension)    Hyperlipidemia    Knee derangement 09/2018   LEFT KNEE   Monoclonal gammopathy    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 Jude single chamber defibrillator   CARDIOVERSION N/A 12/13/2023   Procedure: CARDIOVERSION;  Surgeon: Kate Lonni CROME, MD;  Location: [facility] INVASIVE CV LAB;  Service: Cardiovascular;  Laterality: N/A;   COLONOSCOPY  07/26/2019   EP IMPLANTABLE DEVICE N/A 02/21/2016   Procedure: ICD Generator Changeout;  Surgeon: Danelle LELON [place], MD;  Location: [facility] INVASIVE CV LAB;  Service: Cardiovascular;  Laterality: N/A;   INGUINAL HERNIA REPAIR Left    QUADRICEPS TENDON REPAIR Left 10/20/2018   Procedure: REPAIR QUADRICEP TENDON;  Surgeon: Beverley Evalene BIRCH, MD;  Location: [facility] OR;  Service: Orthopedics;  Laterality: Left;   Family History  Problem Relation Age of Onset   Heart attack Mother    Colon cancer Brother    Heart attack Brother    Seizures Neg Hx    Esophageal cancer Neg Hx    Pancreatic cancer Neg Hx    Rectal cancer Neg Hx    Stomach cancer Neg Hx    Liver cancer Neg Hx    Social History   Occupational History   Occupation: retired    Associate Professor: RETIRED  Tobacco Use   Smoking status: Former    Current packs/day: 0.00    Average packs/day: 0.5 packs/day for 15.0 years (7.5 ttl pk-yrs)    Types: Cigarettes    Start date: 03/23/1989    Quit date: 03/23/2004    Years since quitting: 19.9   Smokeless tobacco: Never  Vaping Use   Vaping status: Never Used  Substance and Sexual Activity   Alcohol use: Not Currently    Alcohol/week: 1.0 standard drink of alcohol    Types: 1 Glasses of wine per week    Comment: rare--wine   Drug use: No   Sexual activity: Not Currently   Tobacco Counseling Counseling given: Not Answered  SDOH Screenings   Food Insecurity: No Food Insecurity (02/28/2024)  Housing: Low Risk  (02/28/2024)  Transportation Needs: No Transportation Needs (02/28/2024)  Utilities: Not At Risk (02/28/2024)   Alcohol Screen: Low Risk  (02/25/2023)  Depression (PHQ2-9): Low Risk  (02/28/2024)  Financial Resource Strain: Low Risk  (02/25/2023)  Physical Activity: Sufficiently Active (02/28/2024)  Social Connections: Moderately Isolated (02/28/2024)  Stress: No Stress Concern Present (02/28/2024)  Tobacco Use: Medium Risk (02/28/2024)  Health Literacy: Adequate Health Literacy (02/28/2024)   See flowsheets for full screening details  Depression Screen PHQ 2 & 9 Depression Scale- Over the past 2 weeks, how often have you been bothered by any of the following problems? Little interest or pleasure in doing things: 0 Feeling down, depressed, or hopeless (PHQ Adolescent also includes...irritable): 0 PHQ-2 Total Score: 0 Trouble falling or staying asleep, or sleeping too much: 0 Feeling tired or having little energy: 0 Poor appetite  or overeating (PHQ Adolescent also includes...weight loss): 0 Feeling bad about yourself - or that you are a failure or have let yourself or your family down: 0 Trouble concentrating on things, such as reading the newspaper or watching television (PHQ Adolescent also includes...like school work): 0 Moving or speaking so slowly that other people could have noticed. Or the opposite - being so fidgety or restless that you have been moving around a lot more than usual: 0 Thoughts that you would be better off dead, or of hurting yourself in some way: 0 PHQ-9 Total Score: 0 If you checked off any problems, how difficult have these problems made it for you to do your work, take care of things at home, or get along with other people?: Not difficult at all  Depression Treatment Depression Interventions/Treatment : EYV7-0 Score <4 Follow-up Not Indicated     Goals Addressed             This Visit's Progress    Patient Stated       To see next year             Objective:    Today's Vitals   02/28/24 1441  BP: 134/82  Weight: 184 lb 8 oz (83.7 kg)  Height: 5' 9  (1.753 m)   Body mass index is 27.25 kg/m.  Hearing/Vision screen Hearing Screening - Comments:: No hearing aids  Vision Screening - Comments:: Wears glasses Immunizations and Health Maintenance Health Maintenance  Topic Date Due   Diabetic kidney evaluation - Urine ACR  Never done   FOOT EXAM  01/30/2023   Medicare Annual Wellness (AWV)  02/25/2024   COVID-19 Vaccine (8 - Pfizer risk 2025-26 season) 06/06/2024   HEMOGLOBIN A1C  07/20/2024   OPHTHALMOLOGY EXAM  11/15/2024   Diabetic kidney evaluation - eGFR measurement  01/20/2025   DTaP/Tdap/Td (4 - Td or Tdap) 12/23/2032   Pneumococcal Vaccine: 50+ Years  Completed   Influenza Vaccine  Completed   Hepatitis C Screening  Completed   Zoster Vaccines- Shingrix  Completed   Meningococcal B Vaccine  Aged Out   Colonoscopy  Discontinued        Assessment/Plan:  This is a routine wellness examination for [place].  Patient Care Team: Wendee Lynwood HERO, NP as PCP - General Waddell Danelle ORN, MD as PCP - Electrophysiology (Cardiology) Fleeta Zerita DASEN, MD as Consulting Physician (Ophthalmology)  I have personally reviewed and noted the following in the patient's chart:   Medical and social history Use of alcohol, tobacco or illicit drugs  Current medications and supplements including opioid prescriptions. Functional ability and status Nutritional status Physical activity Advanced directives List of other physicians Hospitalizations, surgeries, and ER visits in previous 12 months Vitals Screenings to include cognitive, depression, and falls Referrals and appointments  No orders of the defined types were placed in this encounter.  In addition, I have reviewed and discussed with patient certain preventive protocols, quality metrics, and best practice recommendations. A written personalized care plan for preventive services as well as general preventive health recommendations were provided to patient.   Lyle MARLA Right,  [place]   02/28/2024   No follow-ups on file.  After Visit Summary: (MyChart) Due to this being a telephonic visit, the after visit summary with patients personalized plan was offered to patient via MyChart   Nurse Notes: nothing to report

## 2024-03-20 ENCOUNTER — Encounter: Payer: Self-pay | Admitting: Internal Medicine

## 2024-03-20 ENCOUNTER — Ambulatory Visit: Attending: Internal Medicine | Admitting: Internal Medicine

## 2024-03-20 ENCOUNTER — Ambulatory Visit: Payer: Self-pay

## 2024-03-20 VITALS — BP 138/78 | HR 56 | Ht 69.0 in | Wt 186.8 lb

## 2024-03-20 DIAGNOSIS — I498 Other specified cardiac arrhythmias: Secondary | ICD-10-CM | POA: Diagnosis not present

## 2024-03-20 LAB — CUP PACEART INCLINIC DEVICE CHECK
Battery Remaining Longevity: 27 mo
Brady Statistic RV Percent Paced: 0.81 %
Date Time Interrogation Session: 20251229173223
HighPow Impedance: 53.4342
Implantable Lead Connection Status: 753985
Implantable Lead Implant Date: 20080527
Implantable Lead Location: 753860
Implantable Lead Model: 7121
Implantable Pulse Generator Implant Date: 20171201
Lead Channel Impedance Value: 450 Ohm
Lead Channel Pacing Threshold Amplitude: 1 V
Lead Channel Pacing Threshold Amplitude: 1 V
Lead Channel Pacing Threshold Pulse Width: 0.5 ms
Lead Channel Pacing Threshold Pulse Width: 0.5 ms
Lead Channel Sensing Intrinsic Amplitude: 12 mV
Lead Channel Setting Pacing Amplitude: 2.5 V
Lead Channel Setting Pacing Pulse Width: 0.5 ms
Lead Channel Setting Sensing Sensitivity: 0.5 mV
Pulse Gen Serial Number: 7283394
Zone Setting Status: 755011

## 2024-03-20 NOTE — Progress Notes (Signed)
 "     HPI [name] returns today for ongoing followup and preoperative evaluation. He is a pleasant 78 yo man with Brugada syndrome,s/p ICD insertion. He denies chest pain or sob. He remains active. No other complaints. No ICD therapies. He does not feel his SVT. He has tried to lose weight.  Allergies[1]   Current Outpatient Medications  Medication Sig Dispense Refill   acetaminophen  (TYLENOL ) 325 MG tablet Take 650 mg by mouth 4 (four) times daily as needed for moderate pain (pain score 4-6).     albuterol  (VENTOLIN  HFA) 108 (90 Base) MCG/ACT inhaler Inhale 2 puffs into the lungs every 6 (six) hours as needed for wheezing or shortness of breath. 8.5 each 0   blood glucose meter kit and supplies Check sugar twice daily. DX E11.22 1 each 12   Cholecalciferol 50 MCG (2000 UT) TABS Take 2,000 Units by mouth daily.     ciclopirox  (LOPROX ) 0.77 % cream Apply 1 application topically 2 (two) times daily as needed (for rash).      Continuous Blood Gluc Receiver (FREESTYLE LIBRE 14 DAY READER) DEVI 1 each by Does not apply route as directed. 2 each 11   Empagliflozin-metFORMIN  HCl ER 12.07-998 MG TB24 Take by mouth.     Ginkgo Biloba 60 MG TABS Take by mouth.     glipiZIDE  (GLUCOTROL ) 10 MG tablet TAKE 1 TABLET (10 MG TOTAL) BY MOUTH IN THE MORNING AND AT BEDTIME 60 tablet 3   HYDROcodone -acetaminophen  (NORCO) 5-325 MG tablet Take 1-2 tablets by mouth every 6 (six) hours as needed for severe pain (Take tylenol  for mild and moderate pain). 30 tablet 0   levETIRAcetam  (KEPPRA ) 500 MG tablet 1 in the morning and 2 in the afternoon     losartan  (COZAAR ) 100 MG tablet Take 50 mg by mouth daily.     metoprolol  tartrate (LOPRESSOR ) 25 MG tablet Take 1 tablet (25 mg total) by mouth 2 (two) times daily. 180 tablet 3   Multiple Vitamin (MULTIVITAMIN) tablet Take 1 tablet by mouth daily.     nystatin  (MYCOSTATIN /NYSTOP ) powder Apply 1 Application topically 3 (three) times daily. 15 g 0    nystatin -triamcinolone (MYCOLOG II) cream APPLY SMALL AMOUNT TO AFFECTED AREA TWICE A DAY     Omega-3 Fatty Acids (FISH OIL) 1200 MG CAPS Take 1,200 mg by mouth daily.      rivaroxaban  (XARELTO ) 20 MG TABS tablet Take 20 mg by mouth daily with supper.     senna (SENOKOT) 8.6 MG TABS tablet Take 1 tablet (8.6 mg total) by mouth 2 (two) times daily. 120 tablet 0   simvastatin  (ZOCOR ) 20 MG tablet Take 10 mg by mouth at bedtime.      No current facility-administered medications for this visit.     Past Medical History:  Diagnosis Date   Acute myocardial infarction [facility]) 06/1983   Arthritis    Atrial fibrillation (HCC)    Automatic implantable cardiac defibrillator in situ    Borderline diabetes mellitus    BPH without obstruction/lower urinary tract symptoms    CAD (coronary artery disease)    Chronic kidney disease, stage 1    Colonic polyp 2004   hyperplastic    COPD (chronic obstructive pulmonary disease) (HCC)    Diabetes mellitus without complication (HCC)    Fall    GERD (gastroesophageal reflux disease)    History of renal insufficiency syndrome    HTN (hypertension)    Hyperlipidemia    Knee derangement 09/2018  LEFT KNEE   Monoclonal gammopathy    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   Past Surgical History:  Procedure Laterality Date   CARDIAC CATHETERIZATION  1987   Showed distal left circumflex 100% occluded    CARDIAC DEFIBRILLATOR PLACEMENT  08/17/2006   Implantation of a St. Jude single chamber defibrillator   CARDIOVERSION N/A 12/13/2023   Procedure: CARDIOVERSION;  Surgeon: Kate Lonni CROME, MD;  Location: [facility] INVASIVE CV LAB;  Service: Cardiovascular;  Laterality: N/A;   COLONOSCOPY  07/26/2019   EP IMPLANTABLE DEVICE N/A 02/21/2016   Procedure: ICD Generator Changeout;  Surgeon: Danelle LELON Birmingham, MD;  Location: [facility] INVASIVE CV LAB;  Service: Cardiovascular;  Laterality: N/A;   INGUINAL HERNIA REPAIR Left    QUADRICEPS TENDON REPAIR Left 10/20/2018   Procedure: REPAIR QUADRICEP TENDON;  Surgeon: Beverley Evalene BIRCH, MD;  Location: [facility] OR;  Service: Orthopedics;  Laterality: Left;     Family History  Problem Relation Age of Onset   Heart attack Mother    Colon cancer Brother    Heart attack Brother    Seizures Neg Hx    Esophageal cancer Neg Hx    Pancreatic cancer Neg Hx    Rectal cancer Neg Hx    Stomach cancer Neg Hx    Liver cancer Neg Hx      Social History   Socioeconomic History   Marital status: Single    Spouse name: Not on file   Number of children: 1   Years of education: Not on file   Highest education level: Not on file  Occupational History   Occupation: retired    Associate Professor: RETIRED  Tobacco Use   Smoking status: Former    Current packs/day: 0.00    Average packs/day: 0.5 packs/day for 15.0 years (7.5 ttl pk-yrs)    Types: Cigarettes    Start date: 03/23/1989    Quit date: 03/23/2004    Years since quitting: 20.0   Smokeless tobacco: Never  Vaping Use   Vaping status: Never Used  Substance and Sexual Activity   Alcohol use: Not Currently    Alcohol/week: 1.0 standard drink of alcohol    Types: 1 Glasses of wine per week    Comment: rare--wine   Drug use: No   Sexual activity: Not Currently  Other Topics Concern   Not on file  Social History Narrative   ICD-St. Jude  Remote-Yes      Live currently @ [facility] for rehabilitation. Plans to discharge to his daughter's house for continuation of rehab.   Social Drivers of Health   Tobacco Use: Medium Risk (03/20/2024)   Patient History    Smoking Tobacco Use: Former    Smokeless Tobacco Use: Never    Passive Exposure: Not on file  Financial Resource Strain: Low Risk (02/25/2023)   Overall Financial Resource Strain (CARDIA)    Difficulty of Paying Living Expenses: Not very hard  Food  Insecurity: No Food Insecurity (02/28/2024)   Epic    Worried About Programme Researcher, Broadcasting/film/video in the Last Year: Never true    Ran Out of Food in the Last Year: Never true  Transportation Needs: No Transportation Needs (02/28/2024)   Epic    Lack of Transportation (Medical): No    Lack of  Transportation (Non-Medical): No  Physical Activity: Sufficiently Active (02/28/2024)   Exercise Vital Sign    Days of Exercise per Week: 6 days    Minutes of Exercise per Session: 30 min  Stress: No Stress Concern Present (02/28/2024)   [employer] of Occupational Health - Occupational Stress Questionnaire    Feeling of Stress: Not at all  Social Connections: Moderately Isolated (02/28/2024)   Social Connection and Isolation Panel    Frequency of Communication with Friends and Family: More than three times a week    Frequency of Social Gatherings with Friends and Family: More than three times a week    Attends Religious Services: More than 4 times per year    Active Member of Clubs or Organizations: No    Attends Banker Meetings: Never    Marital Status: Divorced  Catering Manager Violence: Not At Risk (02/28/2024)   Epic    Fear of Current or Ex-Partner: No    Emotionally Abused: No    Physically Abused: No    Sexually Abused: No  Depression (PHQ2-9): Low Risk (02/28/2024)   Depression (PHQ2-9)    PHQ-2 Score: 0  Alcohol Screen: Low Risk (02/25/2023)   Alcohol Screen    Last Alcohol Screening Score (AUDIT): 0  Housing: Low Risk (02/28/2024)   Epic    Unable to Pay for Housing in the Last Year: No    Number of Times Moved in the Last Year: 0    Homeless in the Last Year: No  Utilities: Not At Risk (02/28/2024)   Epic    Threatened with loss of utilities: No  Health Literacy: Adequate Health Literacy (02/28/2024)   B1300 Health Literacy    Frequency of need for help with medical instructions: Never     BP 138/78   Pulse (!) 56   Ht 5' 9 (1.753 m)   Wt 186 lb 12.8 oz (84.7 kg)    SpO2 98%   BMI 27.59 kg/m   Physical Exam:  Well appearing NAD HEENT: Unremarkable except for an erythematous scaly macular rash on the scalp.  Neck:  No JVD, no thyromegally Lymphatics:  No adenopathy Back:  No CVA tenderness Lungs:  Clear with no wheezes HEART:  Regular rate rhythm, no murmurs, no rubs, no clicks Abd:  soft, positive bowel sounds, no organomegally, no rebound, no guarding Ext:  2 plus pulses, no edema, no cyanosis, no clubbing Skin:  No rashes no nodules Neuro:  CN II through XII intact, motor grossly intact  DEVICE  Normal device function.  See PaceArt for details.   Assess/Plan: Brugada syndrome - he has not had any ICD therapies. He has some NSVT. PAF - he has a RVR. He is asymptomatic and mostly maintaining NSR. Scalp rash - could be dandruff and I have recommended shampoo with salacylic acid.   Danelle Labron Bloodgood,MD    [1]  Allergies Allergen Reactions   Ace Inhibitors Other (See Comments)    Pt unsure of reaction    Lipitor [Atorvastatin] Rash   "

## 2024-03-20 NOTE — Patient Instructions (Signed)

## 2024-03-20 NOTE — Telephone Encounter (Signed)
 FYI Only or Action Required?: FYI only for provider: appointment scheduled on 03/22/24.  Patient was last seen in primary care on 01/21/2024 by Wendee Lynwood HERO, NP.  Called Nurse Triage reporting Blood Sugar Problem.  Symptoms began about a month ago.  Interventions attempted: Other: sugar blocker medication.  Symptoms are: unsure, caller does not live with patient.  Triage Disposition: See Physician Within 24 Hours  Patient/caregiver understands and will follow disposition?: Yes  Copied from CRM #[id number]. Topic: Clinical - Red Word Triage >> Mar 20, 2024 12:47 PM Delon DASEN wrote: Red Word that prompted transfer to Nurse Triage: Patient blood sugar is running 300- over 400 Reason for Disposition  [1] Blood glucose > 240 mg/dL (86.6 mmol/L) AND [7] pregnant  Answer Assessment - Initial Assessment Questions 1. BLOOD GLUCOSE: What is your blood glucose level?      329 last time blood sugar was checked on 03/17/24 2. ONSET: When did you check the blood glucose?     Blood sugars have been 300's to over 400's since 02/25/2024 3. USUAL RANGE: What is your glucose level usually? (e.g., usual fasting morning value, usual evening value)     Girlfriend states that she has been checking blood sugar for a few months.  Blood sugar had been running in 200's 4. KETONES: Do you check for ketones (urine or blood test strips)? If Yes, ask: What does the test show now?      Not measured 5. TYPE 1 or 2:  Do you know what type of diabetes you have?  (e.g., Type 1, Type 2, Gestational; doesn't know)      Type 2 6. INSULIN : Do you take insulin ? What type of insulin (s) do you use? What is the mode of delivery? (syringe, pen; injection or pump)?      Does not think that patient takes insulin  7. DIABETES PILLS: Do you take any pills for your diabetes? If Yes, ask: Have you missed taking any pills recently?     Patient states that he has medicine for blood sugar, bus she has not seen him  take the medication 8. OTHER SYMPTOMS: Do you have any symptoms? (e.g., fever, frequent urination, difficulty breathing, dizziness, weakness, vomiting)     Lethargic on 02/27/2024 when blood sugar was 494  Protocols used: Diabetes - High Blood Sugar-A-AH

## 2024-03-20 NOTE — Telephone Encounter (Signed)
 Noted. Will evaluate in office. Please have him bring all his medications with him to the visit

## 2024-03-21 NOTE — Telephone Encounter (Signed)
 Left detailed voicemail for patient to call the office back if there are any questions or concerns.

## 2024-03-22 ENCOUNTER — Ambulatory Visit: Admitting: Nurse Practitioner

## 2024-03-22 VITALS — BP 120/64 | HR 52 | Temp 97.6°F | Ht 69.0 in | Wt 191.4 lb

## 2024-03-22 DIAGNOSIS — Z87891 Personal history of nicotine dependence: Secondary | ICD-10-CM | POA: Diagnosis not present

## 2024-03-22 DIAGNOSIS — E1165 Type 2 diabetes mellitus with hyperglycemia: Secondary | ICD-10-CM

## 2024-03-22 DIAGNOSIS — R042 Hemoptysis: Secondary | ICD-10-CM | POA: Diagnosis not present

## 2024-03-22 DIAGNOSIS — Z7984 Long term (current) use of oral hypoglycemic drugs: Secondary | ICD-10-CM

## 2024-03-22 LAB — CBC
HCT: 46.8 % (ref 39.0–52.0)
Hemoglobin: 15.8 g/dL (ref 13.0–17.0)
MCHC: 33.6 g/dL (ref 30.0–36.0)
MCV: 92.5 fl (ref 78.0–100.0)
Platelets: 185 K/uL (ref 150.0–400.0)
RBC: 5.06 Mil/uL (ref 4.22–5.81)
RDW: 13.3 % (ref 11.5–15.5)
WBC: 6.4 K/uL (ref 4.0–10.5)

## 2024-03-22 LAB — BASIC METABOLIC PANEL WITH GFR
BUN: 19 mg/dL (ref 6–23)
CO2: 28 meq/L (ref 19–32)
Calcium: 8.9 mg/dL (ref 8.4–10.5)
Chloride: 98 meq/L (ref 96–112)
Creatinine, Ser: 1.55 mg/dL — ABNORMAL HIGH (ref 0.40–1.50)
GFR: 43.3 mL/min — ABNORMAL LOW
Glucose, Bld: 276 mg/dL — ABNORMAL HIGH (ref 70–99)
Potassium: 4.4 meq/L (ref 3.5–5.1)
Sodium: 132 meq/L — ABNORMAL LOW (ref 135–145)

## 2024-03-22 LAB — POCT GLYCOSYLATED HEMOGLOBIN (HGB A1C): Hemoglobin A1C: 10 % — AB (ref 4.0–5.6)

## 2024-03-22 MED ORDER — BLOOD GLUCOSE TEST VI STRP: 1.0000 | ORAL_STRIP | Freq: Every day | 2 refills | Status: AC

## 2024-03-22 MED ORDER — BLOOD GLUCOSE MONITORING SUPPL DEVI: 1.0000 | 0 refills | Status: DC

## 2024-03-22 MED ORDER — LANCETS MISC: 1.0000 | Freq: Every day | 2 refills | Status: AC

## 2024-03-22 MED ORDER — LANCET DEVICE MISC: 1.0000 | 0 refills | Status: AC

## 2024-03-22 NOTE — Patient Instructions (Addendum)
 Nice to see you today The medications I have for your sugar are:  Glipizide  10mg  that you take twice a day  Empagliflozin-metformin  12.5mg -1000mg  one tablet once a day  Let me know what dose of ozempic (semaglutide) that you are on 0.25mg ? 0.5mg ? 1mg ? 2mg ?

## 2024-03-22 NOTE — Progress Notes (Signed)
 "  Established Patient Office Visit  Subjective   Patient ID: Brad Brown, male    DOB: September 23, 1945  Age: 78 y.o. MRN: [id number]  Chief Complaint  Patient presents with   high blood sugars    Pt complains of having high Blood sugar readings since 02/25/24. States of sometimes feeling lethargic   Medication Management    Pt requests for new glucose meter.     HPI  Discussed the use of AI scribe software for clinical note transcription with the patient, who gave verbal consent to proceed.  History of Present Illness Brad Brown is a 78 year old male with diabetes who presents with elevated blood sugar levels.  He has been experiencing elevated blood sugar levels since the beginning of the month, with readings ranging from the 200s to almost 500. There have been no changes in his medication regimen, which includes a combination pill of empagliflozin and metformin  (12.5 mg/1000 mg once daily) and glipizide  (taken twice daily). He has not missed any doses of his medication.  He checks his blood sugar approximately every other day. His last A1c was 6.9, but his recent blood sugar readings have been higher.  In terms of diet, he eats two to three healthy meals a day with minimal snacking. He has reduced his intake of Pepsi, opting for water instead. His physical activity includes walking when shopping, but no structured exercise routine is mentioned.  He feels 'a little bit off' with the elevated sugars, primarily experiencing increased tiredness. No blurred vision, new numbness, tingling, or weakness. He notes increased urination, which he attributes to higher water intake.     Review of Systems  Constitutional:  Positive for malaise/fatigue. Negative for chills and fever.  Respiratory:  Negative for shortness of breath.   Cardiovascular:  Negative for chest pain.  Neurological:  Negative for headaches.      Objective:     BP 120/64   Pulse (!) 52   Temp 97.6 F  (36.4 C) (Oral)   Ht 5' 9 (1.753 m)   Wt 191 lb 6.4 oz (86.8 kg)   SpO2 98%   BMI 28.26 kg/m    Physical Exam Vitals and nursing note reviewed.  Constitutional:      Appearance: Normal appearance.  Cardiovascular:     Rate and Rhythm: Normal rate and regular rhythm.     Heart sounds: Normal heart sounds.  Pulmonary:     Effort: Pulmonary effort is normal.     Breath sounds: Normal breath sounds.  Neurological:     General: No focal deficit present.     Mental Status: He is alert.     Deep Tendon Reflexes:     Reflex Scores:      Bicep reflexes are 2+ on the right side and 2+ on the left side.      Patellar reflexes are 1+ on the right side and 1+ on the left side.    Comments: Bilateral upper and lower extremity strength 5/5      Results for orders placed or performed in visit on 03/22/24  POCT glycosylated hemoglobin (Hb A1C)  Result Value Ref Range   Hemoglobin A1C 10.0 (A) 4.0 - 5.6 %   HbA1c POC (<> result, manual entry)     HbA1c, POC (prediabetic range)     HbA1c, POC (controlled diabetic range)        The ASCVD Risk score (Arnett DK, et al., 2019) failed to calculate for the following reasons:  Risk score cannot be calculated because patient has a medical history suggesting prior/existing ASCVD   * - Cholesterol units were assumed    Assessment & Plan:   Problem List Items Addressed This Visit   None Visit Diagnoses       Uncontrolled type 2 diabetes mellitus with hyperglycemia (HCC)    -  Primary   Relevant Medications   Blood Glucose Monitoring Suppl DEVI   Glucose Blood (BLOOD GLUCOSE TEST STRIPS) STRP   Lancet Device MISC   Lancets MISC   Other Relevant Orders   CBC   Basic metabolic panel with GFR   POCT glycosylated hemoglobin (Hb A1C) (Completed)     Hemoptysis       Relevant Orders   CT Chest Wo Contrast     Former smoker       Relevant Orders   CT Chest Wo Contrast      Assessment and Plan Assessment & Plan Type 2 diabetes  mellitus with stage 1 chronic kidney disease Blood glucose levels elevated, ranging from 200s to 500s. Awaiting A1c results for current glycemic control assessment. - Checked A1c to assess current glycemic control. - Provided glucose meter for home monitoring. - A1c elevated.  Patient is to go home and check his antidiabetic medications because my list is up-to-date.  States he is already on Ozempic.  If renal functions okay we will increase the Synjardy  Seborrheic dermatitis Rash on head and forehead, extending into hairline. No itching reported. - Pick up antifungal shampoo.  Hemoptysis -Patient complains of hemoptysis several times states that he will spit in the sink and will be blood-tinged no coughing.  Patient does have a history of smoking and is on anticoagulation.  Pending CT scan of chest  Return in about 3 months (around 06/20/2024) for DM recheck.    Adina Crandall, NP  "

## 2024-03-27 ENCOUNTER — Ambulatory Visit: Payer: Self-pay | Admitting: Nurse Practitioner

## 2024-03-27 ENCOUNTER — Telehealth: Payer: Self-pay

## 2024-03-27 ENCOUNTER — Ambulatory Visit

## 2024-03-27 DIAGNOSIS — E1165 Type 2 diabetes mellitus with hyperglycemia: Secondary | ICD-10-CM

## 2024-03-27 DIAGNOSIS — I4892 Unspecified atrial flutter: Secondary | ICD-10-CM

## 2024-03-27 MED ORDER — SEMAGLUTIDE (2 MG/DOSE) 8 MG/3ML ~~LOC~~ SOPN: 2.0000 mg | PEN_INJECTOR | SUBCUTANEOUS | 2 refills | Status: AC

## 2024-03-27 NOTE — Telephone Encounter (Signed)
 If he is on 1mg  of ozempic  once a week then I want to increase the ozempic  to 2mg  weekly.  Verify to make sure it is ozempic  then I will send in prescription

## 2024-03-27 NOTE — Telephone Encounter (Signed)
 I am going to increase the ozempic  to 2mg  weekly. New prescription sent to the pharmacy

## 2024-03-27 NOTE — Telephone Encounter (Signed)
 Copied from CRM [id number]. Topic: General - Other >> Mar 24, 2024  4:23 PM Alexandria E wrote: Reason for CRM: FYI - Patient's spouse, Meade, called in stating that she was advised to follow up with PCP about dosages of medication that the patient is on. [facility] is [facility] 1 [facility], and 12.5 mg of metformin  also prescribed by the [place].

## 2024-03-27 NOTE — Telephone Encounter (Signed)
 Called and spoke with patient.  Verified that he is taking 1mg  of ozempic  and has 4 pens left.

## 2024-03-29 ENCOUNTER — Other Ambulatory Visit: Payer: Self-pay | Admitting: Nurse Practitioner

## 2024-03-29 LAB — CUP PACEART REMOTE DEVICE CHECK
Battery Remaining Longevity: 25 mo
Battery Remaining Percentage: 24 %
Battery Voltage: 2.8 V
Brady Statistic RV Percent Paced: 2.7 %
Date Time Interrogation Session: 20260107114452
HighPow Impedance: 58 Ohm
HighPow Impedance: 58 Ohm
Implantable Lead Connection Status: 753985
Implantable Lead Implant Date: 20080527
Implantable Lead Location: 753860
Implantable Lead Model: 7121
Implantable Pulse Generator Implant Date: 20171201
Lead Channel Impedance Value: 590 Ohm
Lead Channel Pacing Threshold Amplitude: 1 V
Lead Channel Pacing Threshold Pulse Width: 0.5 ms
Lead Channel Sensing Intrinsic Amplitude: 11.7 mV
Lead Channel Setting Pacing Amplitude: 2.5 V
Lead Channel Setting Pacing Pulse Width: 0.5 ms
Lead Channel Setting Sensing Sensitivity: 0.5 mV
Pulse Gen Serial Number: 7283394
Zone Setting Status: 755011

## 2024-03-29 NOTE — Telephone Encounter (Signed)
 Copied from CRM #[id number]. Topic: Clinical - Medication Refill >> Mar 29, 2024 10:51 AM Suzen RAMAN wrote: Medication: Vitamin D3  Has the patient contacted their pharmacy? Yes   This is the patient's preferred pharmacy:  [id number] [place], [place] - [address] [place] [address] Phone: [phone] Fax: [phone]   Is this the correct pharmacy for this prescription? Yes If no, delete pharmacy and type the correct one.   Has the prescription been filled recently? No  Is the patient out of the medication? Yes  Has the patient been seen for an appointment in the last year OR does the patient have an upcoming appointment? Yes  Can we respond through MyChart? No  Agent: Please be advised that Rx refills may take up to 3 business days. We ask that you follow-up with your pharmacy.

## 2024-03-30 NOTE — Progress Notes (Signed)
 Remote ICD Transmission

## 2024-04-01 ENCOUNTER — Ambulatory Visit: Payer: Self-pay | Admitting: Cardiology

## 2024-04-03 ENCOUNTER — Telehealth: Payer: Self-pay

## 2024-04-03 NOTE — Telephone Encounter (Signed)
 Patient was identified as falling into the [place] Measure - Diabetes.   Patient was: Appointment already scheduled for:  06/02/24.

## 2024-04-04 ENCOUNTER — Telehealth: Payer: Self-pay

## 2024-04-04 NOTE — Telephone Encounter (Unsigned)
 Copied from CRM #[id number]. Topic: Appointments - Scheduling Inquiry for Clinic >> Apr 04, 2024  9:43 AM Harlene ORN wrote: Reason for CRM: Have an Order for CT Chest without. Unsuccessful with reaching the patient from 12/31, 01/05 and today on the 13th. Wanted to let the PCP know.  Phone: [phone], option 1, and option 3

## 2024-04-04 NOTE — Telephone Encounter (Signed)
 Can we call the patient and send him a letter please

## 2024-04-05 ENCOUNTER — Telehealth: Payer: Self-pay | Admitting: Internal Medicine

## 2024-04-05 ENCOUNTER — Encounter: Payer: Self-pay | Admitting: Nurse Practitioner

## 2024-04-05 NOTE — Telephone Encounter (Signed)
 Spoke with pt, records placed in the mail to the address provided, attn dr mullins.

## 2024-04-05 NOTE — Telephone Encounter (Signed)
 Called patient left message to call office and mailing letter to call to home address on file.

## 2024-04-05 NOTE — Telephone Encounter (Signed)
 [facility] and test results   Requesting information to be mailed to :  [address]  [facility] [place] [address]

## 2024-04-06 NOTE — Telephone Encounter (Signed)
-----   Message from [facility] [place] L sent at 04/06/2024 12:33 PM EST ----- Spoke with pt. He is aware of his results. Verified his medications, he is taking glipizide  10mg  BID and Ozempic  2mg  but he has been taking this twice weekly instead of weekly. ----- Message ----- From: Wendee Lynwood HERO, NP Sent: 03/27/2024   7:22 AM EST To: Wendee Gunnels  Can we review the labs with the patient. His kidney function is stable. When he was in office he was unsure of his diabetic medications and strength  Can we verify with him what he is taking vs what the chart says. He mentioned that he is on ozempic  but I do not have that on the chart

## 2024-04-06 NOTE — Telephone Encounter (Signed)
 Patient needs to only take ozempic  2mg  once a week

## 2024-04-21 ENCOUNTER — Ambulatory Visit (HOSPITAL_COMMUNITY)
Admission: RE | Admit: 2024-04-21 | Discharge: 2024-04-21 | Disposition: A | Source: Ambulatory Visit | Attending: Nurse Practitioner

## 2024-04-21 DIAGNOSIS — Z87891 Personal history of nicotine dependence: Secondary | ICD-10-CM | POA: Diagnosis present

## 2024-04-21 DIAGNOSIS — R042 Hemoptysis: Secondary | ICD-10-CM | POA: Diagnosis present

## 2024-04-24 ENCOUNTER — Other Ambulatory Visit: Payer: Self-pay | Admitting: Nurse Practitioner

## 2024-04-24 DIAGNOSIS — E1165 Type 2 diabetes mellitus with hyperglycemia: Secondary | ICD-10-CM

## 2024-04-24 DIAGNOSIS — J411 Mucopurulent chronic bronchitis: Secondary | ICD-10-CM

## 2024-04-25 ENCOUNTER — Telehealth: Payer: Self-pay

## 2024-04-25 MED ORDER — GLIPIZIDE 10 MG PO TABS: 10.0000 mg | ORAL_TABLET | Freq: Two times a day (BID) | ORAL | 3 refills | Status: AC

## 2024-04-25 NOTE — Addendum Note (Signed)
 Addended by: WENDEE LYNWOOD HERO on: 04/25/2024 11:05 AM   Modules accepted: Orders

## 2024-04-25 NOTE — Telephone Encounter (Signed)
 Refill provided

## 2024-06-02 ENCOUNTER — Encounter: Admitting: Nurse Practitioner

## 2024-06-26 ENCOUNTER — Ambulatory Visit

## 2024-09-25 ENCOUNTER — Ambulatory Visit

## 2024-12-25 ENCOUNTER — Ambulatory Visit

## 2025-03-26 ENCOUNTER — Ambulatory Visit
